# Patient Record
Sex: Female | Born: 1935 | Race: White | Hispanic: No | Marital: Married | Smoking: Former smoker
Health system: Southern US, Community
[De-identification: ages and names within clinical notes are randomized; demographics above are authoritative.]

## PROBLEM LIST (undated history)

## (undated) DIAGNOSIS — K31819 Angiodysplasia of stomach and duodenum without bleeding: Secondary | ICD-10-CM

## (undated) DIAGNOSIS — F411 Generalized anxiety disorder: Secondary | ICD-10-CM

## (undated) DIAGNOSIS — K922 Gastrointestinal hemorrhage, unspecified: Secondary | ICD-10-CM

## (undated) DIAGNOSIS — J449 Chronic obstructive pulmonary disease, unspecified: Secondary | ICD-10-CM

## (undated) DIAGNOSIS — C4490 Unspecified malignant neoplasm of skin, unspecified: Secondary | ICD-10-CM

## (undated) DIAGNOSIS — D500 Iron deficiency anemia secondary to blood loss (chronic): Secondary | ICD-10-CM

## (undated) DIAGNOSIS — H9190 Unspecified hearing loss, unspecified ear: Secondary | ICD-10-CM

## (undated) DIAGNOSIS — N200 Calculus of kidney: Secondary | ICD-10-CM

## (undated) DIAGNOSIS — K6381 Dieulafoy lesion of intestine: Secondary | ICD-10-CM

## (undated) DIAGNOSIS — I639 Cerebral infarction, unspecified: Secondary | ICD-10-CM

## (undated) DIAGNOSIS — Z8719 Personal history of other diseases of the digestive system: Secondary | ICD-10-CM

## (undated) DIAGNOSIS — K279 Peptic ulcer, site unspecified, unspecified as acute or chronic, without hemorrhage or perforation: Secondary | ICD-10-CM

## (undated) DIAGNOSIS — D638 Anemia in other chronic diseases classified elsewhere: Secondary | ICD-10-CM

## (undated) DIAGNOSIS — R2681 Unsteadiness on feet: Secondary | ICD-10-CM

## (undated) DIAGNOSIS — I10 Essential (primary) hypertension: Secondary | ICD-10-CM

## (undated) DIAGNOSIS — F325 Major depressive disorder, single episode, in full remission: Secondary | ICD-10-CM

## (undated) DIAGNOSIS — M1990 Unspecified osteoarthritis, unspecified site: Secondary | ICD-10-CM

## (undated) DIAGNOSIS — J45909 Unspecified asthma, uncomplicated: Secondary | ICD-10-CM

## (undated) DIAGNOSIS — M810 Age-related osteoporosis without current pathological fracture: Secondary | ICD-10-CM

## (undated) DIAGNOSIS — D649 Anemia, unspecified: Secondary | ICD-10-CM

## (undated) HISTORY — DX: Essential (primary) hypertension: I10

## (undated) HISTORY — DX: Unspecified osteoarthritis, unspecified site: M19.90

## (undated) HISTORY — DX: Calculus of kidney: N20.0

## (undated) HISTORY — DX: Unspecified asthma, uncomplicated: J45.909

## (undated) HISTORY — DX: Personal history of other diseases of the digestive system: Z87.19

## (undated) HISTORY — DX: Age-related osteoporosis without current pathological fracture: M81.0

## (undated) HISTORY — PX: APPENDECTOMY: SHX54

## (undated) HISTORY — PX: ABDOMINAL HYSTERECTOMY: SHX81

## (undated) HISTORY — DX: Iron deficiency anemia secondary to blood loss (chronic): D50.0

## (undated) HISTORY — DX: Cerebral infarction, unspecified: I63.9

## (undated) HISTORY — DX: Unspecified hearing loss, unspecified ear: H91.90

## (undated) HISTORY — PX: TONSILLECTOMY: SUR1361

## (undated) HISTORY — DX: Unspecified malignant neoplasm of skin, unspecified: C44.90

## (undated) HISTORY — DX: Chronic obstructive pulmonary disease, unspecified: J44.9

## (undated) HISTORY — DX: Anemia, unspecified: D64.9

## (undated) HISTORY — DX: Anemia in other chronic diseases classified elsewhere: D63.8

---

## 2002-09-02 ENCOUNTER — Ambulatory Visit (HOSPITAL_COMMUNITY): Admission: RE | Admit: 2002-09-02 | Discharge: 2002-09-02 | Payer: Self-pay | Admitting: Family Medicine

## 2002-09-02 ENCOUNTER — Encounter: Payer: Self-pay | Admitting: Family Medicine

## 2003-05-25 ENCOUNTER — Encounter: Payer: Self-pay | Admitting: Family Medicine

## 2003-05-25 ENCOUNTER — Ambulatory Visit (HOSPITAL_COMMUNITY): Admission: RE | Admit: 2003-05-25 | Discharge: 2003-05-25 | Payer: Self-pay

## 2003-05-25 ENCOUNTER — Ambulatory Visit (HOSPITAL_COMMUNITY): Admission: RE | Admit: 2003-05-25 | Discharge: 2003-05-25 | Payer: Self-pay | Admitting: Family Medicine

## 2004-08-26 DIAGNOSIS — K6381 Dieulafoy lesion of intestine: Secondary | ICD-10-CM

## 2004-08-26 HISTORY — DX: Dieulafoy lesion of intestine: K63.81

## 2005-03-25 ENCOUNTER — Ambulatory Visit (HOSPITAL_COMMUNITY): Admission: RE | Admit: 2005-03-25 | Discharge: 2005-03-25 | Payer: Self-pay

## 2005-03-26 HISTORY — PX: ESOPHAGOGASTRODUODENOSCOPY: SHX1529

## 2005-03-26 HISTORY — PX: OTHER SURGICAL HISTORY: SHX169

## 2005-03-26 HISTORY — PX: COLONOSCOPY: SHX174

## 2005-04-01 ENCOUNTER — Ambulatory Visit: Payer: Self-pay | Admitting: Internal Medicine

## 2005-04-01 ENCOUNTER — Inpatient Hospital Stay (HOSPITAL_COMMUNITY): Admission: AD | Admit: 2005-04-01 | Discharge: 2005-04-05 | Payer: Self-pay | Admitting: Family Medicine

## 2005-04-04 ENCOUNTER — Ambulatory Visit: Payer: Self-pay | Admitting: *Deleted

## 2005-05-07 ENCOUNTER — Encounter: Admission: RE | Admit: 2005-05-07 | Discharge: 2005-05-25 | Payer: Self-pay | Admitting: Oncology

## 2005-05-07 ENCOUNTER — Ambulatory Visit (HOSPITAL_COMMUNITY): Payer: Self-pay | Admitting: Oncology

## 2005-05-07 ENCOUNTER — Encounter (HOSPITAL_COMMUNITY): Admission: RE | Admit: 2005-05-07 | Discharge: 2005-05-25 | Payer: Self-pay | Admitting: Oncology

## 2005-05-14 ENCOUNTER — Ambulatory Visit (HOSPITAL_COMMUNITY): Admission: RE | Admit: 2005-05-14 | Discharge: 2005-05-14 | Payer: Self-pay | Admitting: Oncology

## 2005-06-12 ENCOUNTER — Encounter (HOSPITAL_COMMUNITY): Admission: RE | Admit: 2005-06-12 | Discharge: 2005-07-12 | Payer: Self-pay | Admitting: Oncology

## 2005-06-12 ENCOUNTER — Encounter: Admission: RE | Admit: 2005-06-12 | Discharge: 2005-06-12 | Payer: Self-pay | Admitting: Oncology

## 2005-07-02 ENCOUNTER — Ambulatory Visit (HOSPITAL_COMMUNITY): Payer: Self-pay | Admitting: Oncology

## 2005-07-02 ENCOUNTER — Encounter (HOSPITAL_COMMUNITY): Admission: RE | Admit: 2005-07-02 | Discharge: 2005-08-01 | Payer: Self-pay | Admitting: Oncology

## 2005-07-02 ENCOUNTER — Encounter: Admission: RE | Admit: 2005-07-02 | Discharge: 2005-07-02 | Payer: Self-pay | Admitting: Oncology

## 2005-08-06 ENCOUNTER — Encounter: Admission: RE | Admit: 2005-08-06 | Discharge: 2005-08-22 | Payer: Self-pay | Admitting: Oncology

## 2005-08-06 ENCOUNTER — Encounter (HOSPITAL_COMMUNITY): Admission: RE | Admit: 2005-08-06 | Discharge: 2005-08-22 | Payer: Self-pay | Admitting: Oncology

## 2005-08-22 ENCOUNTER — Ambulatory Visit (HOSPITAL_COMMUNITY): Payer: Self-pay | Admitting: Oncology

## 2005-09-19 ENCOUNTER — Encounter (HOSPITAL_COMMUNITY): Admission: RE | Admit: 2005-09-19 | Discharge: 2005-10-19 | Payer: Self-pay | Admitting: Oncology

## 2005-09-19 ENCOUNTER — Encounter: Admission: RE | Admit: 2005-09-19 | Discharge: 2005-09-19 | Payer: Self-pay | Admitting: Oncology

## 2005-10-21 ENCOUNTER — Encounter: Admission: RE | Admit: 2005-10-21 | Discharge: 2005-10-21 | Payer: Self-pay | Admitting: Oncology

## 2005-10-21 ENCOUNTER — Ambulatory Visit (HOSPITAL_COMMUNITY): Payer: Self-pay | Admitting: Oncology

## 2005-12-02 ENCOUNTER — Encounter: Admission: RE | Admit: 2005-12-02 | Discharge: 2005-12-02 | Payer: Self-pay | Admitting: Oncology

## 2005-12-02 ENCOUNTER — Encounter (HOSPITAL_COMMUNITY): Admission: RE | Admit: 2005-12-02 | Discharge: 2006-01-01 | Payer: Self-pay | Admitting: Oncology

## 2005-12-11 ENCOUNTER — Ambulatory Visit (HOSPITAL_COMMUNITY): Payer: Self-pay | Admitting: Oncology

## 2006-01-13 ENCOUNTER — Encounter: Admission: RE | Admit: 2006-01-13 | Discharge: 2006-01-13 | Payer: Self-pay | Admitting: Oncology

## 2006-01-13 ENCOUNTER — Encounter (HOSPITAL_COMMUNITY): Admission: RE | Admit: 2006-01-13 | Discharge: 2006-02-12 | Payer: Self-pay | Admitting: Oncology

## 2006-03-07 ENCOUNTER — Encounter: Admission: RE | Admit: 2006-03-07 | Discharge: 2006-03-07 | Payer: Self-pay | Admitting: Oncology

## 2006-03-07 ENCOUNTER — Encounter (HOSPITAL_COMMUNITY): Admission: RE | Admit: 2006-03-07 | Discharge: 2006-04-06 | Payer: Self-pay | Admitting: Oncology

## 2006-03-07 ENCOUNTER — Ambulatory Visit (HOSPITAL_COMMUNITY): Payer: Self-pay | Admitting: Oncology

## 2010-09-26 ENCOUNTER — Encounter: Payer: Self-pay | Admitting: Family Medicine

## 2011-10-30 ENCOUNTER — Ambulatory Visit (HOSPITAL_COMMUNITY)
Admission: RE | Admit: 2011-10-30 | Discharge: 2011-10-30 | Disposition: A | Discharge status: Home / Self Care | Payer: Medicare Other | Source: Ambulatory Visit | Attending: Family Medicine | Admitting: Family Medicine

## 2011-10-30 ENCOUNTER — Other Ambulatory Visit: Payer: Self-pay | Admitting: Family Medicine

## 2011-10-30 DIAGNOSIS — R059 Cough, unspecified: Secondary | ICD-10-CM

## 2011-10-30 DIAGNOSIS — J449 Chronic obstructive pulmonary disease, unspecified: Secondary | ICD-10-CM | POA: Insufficient documentation

## 2011-10-30 DIAGNOSIS — R05 Cough: Secondary | ICD-10-CM | POA: Diagnosis not present

## 2011-10-30 DIAGNOSIS — M810 Age-related osteoporosis without current pathological fracture: Secondary | ICD-10-CM | POA: Diagnosis not present

## 2011-10-30 DIAGNOSIS — E559 Vitamin D deficiency, unspecified: Secondary | ICD-10-CM | POA: Diagnosis not present

## 2011-10-30 DIAGNOSIS — F17200 Nicotine dependence, unspecified, uncomplicated: Secondary | ICD-10-CM | POA: Diagnosis not present

## 2011-10-30 DIAGNOSIS — J4489 Other specified chronic obstructive pulmonary disease: Secondary | ICD-10-CM | POA: Insufficient documentation

## 2011-10-31 DIAGNOSIS — I10 Essential (primary) hypertension: Secondary | ICD-10-CM | POA: Diagnosis not present

## 2011-10-31 DIAGNOSIS — M810 Age-related osteoporosis without current pathological fracture: Secondary | ICD-10-CM | POA: Diagnosis not present

## 2011-10-31 DIAGNOSIS — B150 Hepatitis A with hepatic coma: Secondary | ICD-10-CM | POA: Diagnosis not present

## 2011-11-11 DIAGNOSIS — L820 Inflamed seborrheic keratosis: Secondary | ICD-10-CM | POA: Diagnosis not present

## 2011-11-11 DIAGNOSIS — L570 Actinic keratosis: Secondary | ICD-10-CM | POA: Diagnosis not present

## 2012-05-04 DIAGNOSIS — J449 Chronic obstructive pulmonary disease, unspecified: Secondary | ICD-10-CM | POA: Diagnosis not present

## 2012-06-17 DIAGNOSIS — Z23 Encounter for immunization: Secondary | ICD-10-CM | POA: Diagnosis not present

## 2012-08-06 DIAGNOSIS — J449 Chronic obstructive pulmonary disease, unspecified: Secondary | ICD-10-CM | POA: Diagnosis not present

## 2012-08-26 DIAGNOSIS — K31819 Angiodysplasia of stomach and duodenum without bleeding: Secondary | ICD-10-CM

## 2012-08-26 HISTORY — DX: Angiodysplasia of stomach and duodenum without bleeding: K31.819

## 2013-01-11 ENCOUNTER — Encounter: Payer: Self-pay | Admitting: *Deleted

## 2013-01-12 ENCOUNTER — Encounter: Payer: Self-pay | Admitting: Family Medicine

## 2013-01-12 ENCOUNTER — Ambulatory Visit (INDEPENDENT_AMBULATORY_CARE_PROVIDER_SITE_OTHER): Payer: Medicare Other | Admitting: Family Medicine

## 2013-01-12 VITALS — BP 98/60 | HR 70 | Wt 96.4 lb

## 2013-01-12 DIAGNOSIS — J449 Chronic obstructive pulmonary disease, unspecified: Secondary | ICD-10-CM | POA: Insufficient documentation

## 2013-01-12 DIAGNOSIS — M810 Age-related osteoporosis without current pathological fracture: Secondary | ICD-10-CM | POA: Diagnosis not present

## 2013-01-12 NOTE — Progress Notes (Signed)
  Subjective:    Patient ID: Carol Massey, female    DOB: 03-28-1926, 79 y.o.   MRN: [id number]  Hypertension This is a chronic problem.  This patient has COPD he has history hypertension and she is very resistant toward doing anything for it she smokes she knows she smokes she doesn't want to quit she's been counseled to quit she also states she does not want to have any tests nor any medications. She has a history of osteoporosis does not want any testing. She comes here today to have her lungs listened to. She denies any specific troubles. Occasionally coughs no hemoptysis. Family history noncontributory underlying stress with her family sickness Social she does smoke    Review of Systems See above    Objective:   Physical Exam Lungs are clear no crackles heart is regular pulses normal neck no masses skin warm dry vital signs stable she has gained weight       Assessment & Plan:  COPD-stable I encourage her to quit smoking she never well She has gained weight for which we are proud of her for doing so Osteoporosis recommend bone density testing she refuses Followup 6 months

## 2013-03-15 ENCOUNTER — Ambulatory Visit (INDEPENDENT_AMBULATORY_CARE_PROVIDER_SITE_OTHER): Payer: Medicare Other | Admitting: Family Medicine

## 2013-03-15 ENCOUNTER — Ambulatory Visit (HOSPITAL_COMMUNITY)
Admission: RE | Admit: 2013-03-15 | Discharge: 2013-03-15 | Disposition: A | Discharge status: Home / Self Care | Payer: Medicare Other | Source: Ambulatory Visit | Attending: Family Medicine | Admitting: Family Medicine

## 2013-03-15 ENCOUNTER — Encounter: Payer: Self-pay | Admitting: Family Medicine

## 2013-03-15 VITALS — BP 130/70 | HR 80 | Wt 97.4 lb

## 2013-03-15 DIAGNOSIS — R071 Chest pain on breathing: Secondary | ICD-10-CM | POA: Insufficient documentation

## 2013-03-15 DIAGNOSIS — J449 Chronic obstructive pulmonary disease, unspecified: Secondary | ICD-10-CM | POA: Diagnosis not present

## 2013-03-15 DIAGNOSIS — R0781 Pleurodynia: Secondary | ICD-10-CM

## 2013-03-15 LAB — CK TOTAL AND CKMB (NOT AT ARMC): Relative Index: 2.6 — ABNORMAL HIGH (ref 0.0–2.5)

## 2013-03-15 LAB — TROPONIN I: Troponin I: <0.3 ng/mL (ref <0.30)

## 2013-03-15 LAB — D-DIMER, QUANTITATIVE: D-Dimer, Quant: 0.37 ug/mL-FEU (ref 0.00–0.48)

## 2013-03-15 MED ORDER — PREDNISONE 20 MG PO TABS: ORAL_TABLET | ORAL | Status: AC

## 2013-03-15 MED ORDER — TRAMADOL HCL 50 MG PO TABS: 50.0000 mg | ORAL_TABLET | Freq: Four times a day (QID) | ORAL | Status: DC | PRN

## 2013-03-15 NOTE — Progress Notes (Signed)
  Subjective:    Patient ID: Carol Massey, female    DOB: December 14, 1925, 79 y.o.   MRN: [id number]  Chest Pain  This is a new problem. The current episode started yesterday. The onset quality is sudden. The problem occurs constantly. The problem has been unchanged. The pain is moderate. The quality of the pain is described as dull. The pain radiates to the left arm, left neck and left shoulder.   PMH COPD osteoporosis. She denies any chest pressure in the chest. She denies any angina symptoms. This came on right or quickly over the past 2 days she denies any swelling in the legs   Review of Systems  Cardiovascular: Positive for chest pain.   this patient is a smoker she's been counseled numerous times quit and has not wanted to. She does relate shortness of breath with activity she also states stabbing pain in the left posterior aspect of the chest sharp radiates to her chest hurts to take a deep breath    Objective:   Physical Exam Extremities no edema pulses are normal blood pressure good lungs are clear heart is regular no rash seen moderate tenderness left upper back       Assessment & Plan:  Pleuritic chest pain-x-ray-d-dimer, CPK-MB, troponin I. Encourage patient to quit smoking she is not going to. Prednisone taper over the next 8 days, may need further tests. Tramadol if necessary for pain every 6 hours when necessary  It should be noted that the patient's tests came back normal CK-MB total also troponin was normal d-dimer normal. Him be fraction slightly elevated is non-consequential. I believe the patient is having pleuritic pain she will followup in a few days for recheck.

## 2013-03-18 ENCOUNTER — Ambulatory Visit (INDEPENDENT_AMBULATORY_CARE_PROVIDER_SITE_OTHER): Payer: Medicare Other | Admitting: Family Medicine

## 2013-03-18 ENCOUNTER — Encounter: Payer: Self-pay | Admitting: Family Medicine

## 2013-03-18 VITALS — BP 132/86 | Wt 97.0 lb

## 2013-03-18 DIAGNOSIS — J449 Chronic obstructive pulmonary disease, unspecified: Secondary | ICD-10-CM

## 2013-03-18 DIAGNOSIS — E785 Hyperlipidemia, unspecified: Secondary | ICD-10-CM | POA: Diagnosis not present

## 2013-03-18 NOTE — Progress Notes (Signed)
  Subjective:    Patient ID: Carol Massey, female    DOB: 12-28-1925, 79 y.o.   MRN: [id number]  HPI Patient arrives to follow up on chest pain. This patient seen recently it is felt to be pleuritic pain was put on prednisone she actually states she's much better her breathing is doing fine she denies any coughing still low but a sore around the upper part of her neck when she takes a deep breath she notices it. She denies hemoptysis. She denies swelling in the legs. PMH COPD patient is a smoker labs and x-rays from previous was reviewed with patient. Patient not interested in quitting smoking.   Review of Systems See above    Objective:   Physical Exam Lungs are clear no crackles heart is regular pulse normal neck no masses no tenderness on today's exam extremities no edema       Assessment & Plan:  Pleuritic chest pain resolved. I doubt that this is any sign of aortic dissection. I told the patient if she starts having return of symptoms she is to let us know. She is followup in 3 months  Hyperlipidemia check lab work been one year

## 2013-03-24 DIAGNOSIS — E785 Hyperlipidemia, unspecified: Secondary | ICD-10-CM | POA: Diagnosis not present

## 2013-03-25 LAB — LIPID PANEL
HDL: 67 mg/dL (ref >39)
LDL Cholesterol: 74 mg/dL (ref 0–99)
Triglycerides: 114 mg/dL (ref <150)
VLDL: 23 mg/dL (ref 0–40)

## 2013-03-26 ENCOUNTER — Encounter: Payer: Self-pay | Admitting: Family Medicine

## 2013-03-30 ENCOUNTER — Encounter: Payer: Self-pay | Admitting: Family Medicine

## 2013-05-25 ENCOUNTER — Ambulatory Visit (INDEPENDENT_AMBULATORY_CARE_PROVIDER_SITE_OTHER): Payer: Medicare Other | Admitting: Family Medicine

## 2013-05-25 ENCOUNTER — Encounter: Payer: Self-pay | Admitting: Family Medicine

## 2013-05-25 VITALS — BP 102/70 | Ht <= 58 in | Wt 98.1 lb

## 2013-05-25 DIAGNOSIS — J449 Chronic obstructive pulmonary disease, unspecified: Secondary | ICD-10-CM

## 2013-05-25 DIAGNOSIS — Z23 Encounter for immunization: Secondary | ICD-10-CM

## 2013-05-25 NOTE — Progress Notes (Signed)
  Subjective:    Patient ID: Carol Massey, female    DOB: 03/05/1926, 78 y.o.   MRN: [id number]  Hyperlipidemia This is a chronic problem. The current episode started more than 1 year ago. The problem is controlled. Recent lipid tests were reviewed and are normal. There are no known factors aggravating her hyperlipidemia. Pertinent negatives include no chest pain. She is currently on no antihyperlipidemic treatment. The current treatment provides moderate improvement of lipids. There are no compliance problems.  There are no known risk factors for coronary artery disease.    Patient complains of congestion and cough for 2 months now. Patient smokes and she will never quit she states she denies any weight loss denies hemoptysis denies sweats chills fevers.   Review of Systems  Constitutional: Negative for fever, appetite change and fatigue.  HENT: Positive for congestion and rhinorrhea.   Respiratory: Negative for cough, choking and chest tightness.   Cardiovascular: Negative for chest pain and leg swelling.  Gastrointestinal: Negative for abdominal pain.       Objective:   Physical Exam  Vitals reviewed. Constitutional: She appears well-developed.  HENT:  Head: Normocephalic and atraumatic.  Right Ear: External ear normal.  Left Ear: External ear normal.  Mouth/Throat: Oropharynx is clear and moist.  Cardiovascular: Normal rate and regular rhythm.   Murmur (right sternal border 2/6 murmur) heard. Pulmonary/Chest: Effort normal and breath sounds normal. She has no wheezes.  Lymphadenopathy:    She has no cervical adenopathy.          Assessment & Plan:  Flu vaccine today COPD-stable I have talked with patient at length about the importance of quitting smoking but this is just not something she is ever going to do. Recent lipid profile looked great. Followup patient 6 months.

## 2013-06-17 ENCOUNTER — Ambulatory Visit (INDEPENDENT_AMBULATORY_CARE_PROVIDER_SITE_OTHER): Payer: Medicare Other | Admitting: Family Medicine

## 2013-06-17 ENCOUNTER — Ambulatory Visit (HOSPITAL_COMMUNITY)
Admission: RE | Admit: 2013-06-17 | Discharge: 2013-06-17 | Disposition: A | Discharge status: Home / Self Care | Payer: Medicare Other | Source: Ambulatory Visit | Attending: Family Medicine | Admitting: Family Medicine

## 2013-06-17 ENCOUNTER — Encounter: Payer: Self-pay | Admitting: Family Medicine

## 2013-06-17 VITALS — BP 130/88 | Temp 98.1°F | Ht <= 58 in | Wt 100.4 lb

## 2013-06-17 DIAGNOSIS — R5381 Other malaise: Secondary | ICD-10-CM

## 2013-06-17 DIAGNOSIS — R079 Chest pain, unspecified: Secondary | ICD-10-CM | POA: Diagnosis not present

## 2013-06-17 DIAGNOSIS — R0781 Pleurodynia: Secondary | ICD-10-CM

## 2013-06-17 DIAGNOSIS — R5383 Other fatigue: Secondary | ICD-10-CM | POA: Diagnosis not present

## 2013-06-17 LAB — CBC
HCT: 29.8 % — ABNORMAL LOW (ref 36.0–46.0)
MCH: 22.6 pg — ABNORMAL LOW (ref 26.0–34.0)
MCV: 75.8 fL — ABNORMAL LOW (ref 78.0–100.0)
Platelets: 322 10*3/uL (ref 150–400)
RDW: 17.2 % — ABNORMAL HIGH (ref 11.5–15.5)
WBC: 5.9 10*3/uL (ref 4.0–10.5)

## 2013-06-17 NOTE — Progress Notes (Signed)
  Subjective:    Patient ID: Carol Massey, female    DOB: [birth year], 79 y.o.   MRN: [id number]  HPI Patient is here today b/c she is having pain in her ribs (left side).  She was unloading the clothes from the washing machine on Tuesday, and bent over too far and heard a "crack". Patient is now experiencing pain on the left side of her ribs.   No other concerns.  Patient's family states they feel she is weak at times and fatigued patient states that she is older she smokes and she feels it's natural to feel the way she does patient was concerned about mainly her ribs. Family concerned about possibility of been anemic. Hemoglobin was checked it was slightly low no fevers she did have a chest x-ray several months ago which was normal she's been counseled numerous times quit smoking he has no interest in quitting. Past medical history benign family history benign patient also has very little interest in treatment of osteoporosis or any other health problems   Review of Systems See above she denies high fever chills sweats vomiting diarrhea rectal bleeding    Objective:   Physical Exam  Lungs are clear hearts regular tenderness ribs abdomen soft extremities no edema      Assessment & Plan:  Anemia-check CBC await the results Rib discomfort x-rays ordered  It should be noted that her x-rays came back later in the day negative. Her son was spoken with. More than likely has cartilage damage. To followup if any other particular problems awaiting lab work. Patient in the past did have to be admitted into the hospital do to anemia and given blood I doubt that that is necessary this time.

## 2013-06-18 LAB — BASIC METABOLIC PANEL
CO2: 29 mEq/L (ref 19–32)
Calcium: 9.6 mg/dL (ref 8.4–10.5)
Glucose, Bld: 91 mg/dL (ref 70–99)

## 2013-06-22 ENCOUNTER — Other Ambulatory Visit (INDEPENDENT_AMBULATORY_CARE_PROVIDER_SITE_OTHER): Payer: Medicare Other | Admitting: *Deleted

## 2013-06-22 DIAGNOSIS — D649 Anemia, unspecified: Secondary | ICD-10-CM

## 2013-06-22 NOTE — Progress Notes (Signed)
Spoke with patient and son regarding: lab shows anemia-possible iron def- rec ferritin,TIBC,retic count and heme cards times three-follow up OV to discuss once labs and cards are done. Patient verbalized understanding and will pick up the lab work today, get BW done, and will schedule an OV.

## 2013-06-23 ENCOUNTER — Other Ambulatory Visit: Payer: Self-pay | Admitting: Family Medicine

## 2013-06-23 DIAGNOSIS — D509 Iron deficiency anemia, unspecified: Secondary | ICD-10-CM

## 2013-06-23 LAB — IRON AND TIBC
%SAT: 8 % — ABNORMAL LOW (ref 20–55)
Iron: 29 ug/dL — ABNORMAL LOW (ref 42–145)

## 2013-06-29 ENCOUNTER — Encounter: Payer: Self-pay | Admitting: Gastroenterology

## 2013-07-14 ENCOUNTER — Other Ambulatory Visit: Payer: Self-pay | Admitting: Gastroenterology

## 2013-07-14 ENCOUNTER — Encounter: Payer: Self-pay | Admitting: Gastroenterology

## 2013-07-14 ENCOUNTER — Encounter (INDEPENDENT_AMBULATORY_CARE_PROVIDER_SITE_OTHER): Payer: Self-pay

## 2013-07-14 ENCOUNTER — Ambulatory Visit (INDEPENDENT_AMBULATORY_CARE_PROVIDER_SITE_OTHER): Payer: Medicare Other | Admitting: Gastroenterology

## 2013-07-14 VITALS — BP 158/60 | HR 70 | Temp 97.6°F | Ht <= 58 in | Wt 101.2 lb

## 2013-07-14 DIAGNOSIS — D509 Iron deficiency anemia, unspecified: Secondary | ICD-10-CM | POA: Diagnosis not present

## 2013-07-14 LAB — FERRITIN: Ferritin: 9 ng/mL — ABNORMAL LOW (ref 10–291)

## 2013-07-14 LAB — CBC WITH DIFFERENTIAL/PLATELET
Eosinophils Absolute: 0.1 10*3/uL (ref 0.0–0.7)
Eosinophils Relative: 3 % (ref 0–5)
Lymphocytes Relative: 31 % (ref 12–46)
Lymphs Abs: 1.4 10*3/uL (ref 0.7–4.0)
Monocytes Relative: 8 % (ref 3–12)
Neutro Abs: 2.5 10*3/uL (ref 1.7–7.7)
Platelets: 294 10*3/uL (ref 150–400)
RBC: 3.73 MIL/uL — ABNORMAL LOW (ref 3.87–5.11)
RDW: 16.6 % — ABNORMAL HIGH (ref 11.5–15.5)

## 2013-07-14 NOTE — Progress Notes (Signed)
Primary Care Physician:  Lilyan Punt, MD  Primary Gastroenterologist:  Roetta Sessions, MD   Chief Complaint  Patient presents with  . Anemia    HPI:  Carol Massey is a 79 y.o. female here for further evaluation of microcytic anemia. Labs consistent with iron deficiency anemia. She was Hemoccult negative x3. Patient was evaluated by [place] back in 2006 when she presented to the hospital with a hemoglobin of 3, Hemoccult positive stools at that time. EGD and colonoscopy at that time showed a couple of tiny distal esophageal erosions, rectal and rectosigmoid polyps which were removed. Small bowel capsule study was done to complete workup which showed possible 2 small Dieulafoy lesions at the descending colon although these were not seen at time of colonoscopy. Patient was evaluated by Dr. Mariel Sleet at that time, records unavailable. Patient reports she was told her bone marrow wasn't making enough "blood". She received 7 units of prbcs during/around the time of that hospitalization per patient.   She states she does not want to have a colonoscopy. She doesn't feel like she can go through the bowel prep. She denies constipation, diarrhea, melena, brbpr, abdominal pain, vomiting, heartburn, dysphagia, anorexia, weight loss.  C/o persistently sore left lower rib cage but much improved since injury couple of weeks ago.  Current Outpatient Prescriptions  Medication Sig Dispense Refill  . aspirin 81 MG tablet Take 81 mg by mouth daily.      . Calcium Carb-Cholecalciferol (CALCIUM + D3 PO) Take by mouth daily.      . IRON PO Take by mouth daily.      [name] UNABLE TO FIND Vit C one every day       No current facility-administered medications for this visit.    Allergies as of 07/14/2013  . (No Known Allergies)    Past Medical History  Diagnosis Date  . Stroke   . Hypertension   . Osteoporosis   . COPD (chronic obstructive pulmonary disease)   . Anemia   . Hard of hearing     Past Surgical History   Procedure Laterality Date  . Appendectomy    . Abdominal hysterectomy    . Tonsillectomy    . Colonoscopy  03/2005    Dr. Jena Gauss: rectal and rectosigmoid polyps removed  . Esophagogastroduodenoscopy  03/2005    Dr. Jena Gauss: tiney distal esophageal erosions  . Small bowel capsule study  03/2005    Dr. Karilyn Cota: two small Dieulafoy lesions at ascending colon. tiney specks of blood at duodenal bulb but no identified lesions    Family History  Problem Relation Age of Onset  . Hypertension Mother   . Leukemia Father 33  . Stroke Mother     History   Social History  . Marital Status: Married    Spouse Name: N/A    Number of Children: 3  . Years of Education: N/A   Occupational History  . retired from [employer]    Social History Main Topics  . Smoking status: Current Every Day Smoker  . Smokeless tobacco: Not on file     Comment: Occasionally smokes a cigerette  . Alcohol Use: No  . Drug Use: No  . Sexual Activity: Not on file   Other Topics Concern  . Not on file   Social History Narrative  . No narrative on file      ROS:  General: Negative for anorexia, weight loss, fever, chills, fatigue, weakness. Eyes: Negative for vision changes.  ENT: Negative for hoarseness, difficulty  swallowing , nasal congestion. CV: Negative for chest pain, angina, palpitations, dyspnea on exertion, peripheral edema.  Respiratory: Negative for dyspnea at rest, dyspnea on exertion, cough, sputum, wheezing.  GI: See history of present illness. GU:  Negative for dysuria, hematuria, urinary incontinence, urinary frequency, nocturnal urination.  MS: Negative for joint pain, low back pain.  Derm: Negative for rash or itching.  Neuro: Negative for weakness, abnormal sensation, seizure, frequent headaches, memory loss, confusion.  Psych: Negative for anxiety, depression, suicidal ideation, hallucinations.  Endo: Negative for unusual weight change.  Heme: Negative for bruising or  bleeding. Allergy: Negative for rash or hives.    Physical Examination:  BP 185/76  Pulse 70  Temp(Src) 97.6 F (36.4 C) (Oral)  Ht 4' 8.5" (1.435 m)  Wt 101 lb 3.2 oz (45.904 kg)  BMI 22.29 kg/m2   General: Well-nourished, well-developed in no acute distress.  Head: Normocephalic, atraumatic.   Eyes: Conjunctiva pink, no icterus. Mouth: Oropharyngeal mucosa moist and pink , no lesions erythema or exudate. Neck: Supple without thyromegaly, masses, or lymphadenopathy.  Lungs: Clear to auscultation bilaterally.  Heart: Regular rate and rhythm, no murmurs rubs or gallops.  Abdomen: Bowel sounds are normal, nontender, nondistended, no hepatosplenomegaly or masses, no abdominal bruits or    hernia , no rebound or guarding.   Rectal: not performed Extremities: No lower extremity edema. No clubbing or deformities.  Neuro: Alert and oriented x 4 , grossly normal neurologically.  Skin: Warm and dry, no rash or jaundice.   Psych: Alert and cooperative, normal mood and affect.  Labs: Lab Results  Component Value Date   FERRITIN 10 06/22/2013   Lab Results  Component Value Date   IRON 29* 06/22/2013   TIBC 351 06/22/2013   FERRITIN 10 06/22/2013   Lab Results  Component Value Date   WBC 5.9 06/17/2013   HGB 8.9* 06/17/2013   HCT 29.8* 06/17/2013   MCV 75.8* 06/17/2013   PLT 322 06/17/2013   Lab Results  Component Value Date   CREATININE 0.60 06/17/2013   BUN 9 06/17/2013   NA 141 06/17/2013   K 4.1 06/17/2013   CL 105 06/17/2013   CO2 29 06/17/2013        Imaging Studies: Dg Ribs Unilateral Left  06/17/2013   CLINICAL DATA:  Left rib pain.  EXAM: LEFT RIBS - 2 VIEW  COMPARISON:  03/15/2013  FINDINGS: No visible rib fracture. No pneumothorax. Left lung is clear. No effusion.  IMPRESSION: Negative.   Electronically Signed   By: Charlett Nose M.D.   On: 06/17/2013 13:29

## 2013-07-14 NOTE — Assessment & Plan Note (Signed)
79 y/o female with microcytic anemia, most c/w IDA, heme negative stools X 3. Presented in 2006 with profound anemia and leukopenia with heme + stools then. EGD/TCS unrevealing. SB capsule with ?of two small Dieulafoy lesions in ascending colon (not seen at time of colonoscopy). Patient reports following with Dr. Mariel Sleet for about one year after this.   Currently she has no GI symptoms. She states she is not interested in colonoscopy. Explained that typical work-up/management of IDA would be colonoscopy with possible EGD. Given ?history of Dieulafoy's lesion in colon would highly recommend considering colonoscopy. We discussed collecting ifobt and repeating CBC at this time. Further recommendations to follow.

## 2013-07-14 NOTE — Patient Instructions (Signed)
1. Please collect your stool specimen and return to our office as soon as you can.

## 2013-07-14 NOTE — Progress Notes (Signed)
Cc PCP 

## 2013-07-20 ENCOUNTER — Ambulatory Visit (INDEPENDENT_AMBULATORY_CARE_PROVIDER_SITE_OTHER): Payer: Medicare Other | Admitting: Gastroenterology

## 2013-07-20 ENCOUNTER — Telehealth: Payer: Self-pay | Admitting: Gastroenterology

## 2013-07-20 DIAGNOSIS — D649 Anemia, unspecified: Secondary | ICD-10-CM

## 2013-07-20 NOTE — Telephone Encounter (Signed)
Hemoccult negative.  I'm copying this to [place] as well for an [employer]

## 2013-07-20 NOTE — Telephone Encounter (Signed)
Patient is calling for stool study results and lab work , please advise?

## 2013-07-20 NOTE — Telephone Encounter (Signed)
Routing to [facility] box

## 2013-07-20 NOTE — Progress Notes (Signed)
Quick Note:  Please let patient know her Hgb is slightly lower from 8.9 to 8.1 four weeks ago. Stool is negative for blood.  Can we add a TTG and IgA level? I need to discuss with RMR since patient not interested in TCS. ______

## 2013-07-20 NOTE — Progress Notes (Signed)
Pt returned IFOBT test and it was NEGATIVE 

## 2013-07-21 NOTE — Telephone Encounter (Signed)
Pt is aware of results. 

## 2013-07-21 NOTE — Telephone Encounter (Signed)
See result note.  

## 2013-07-22 LAB — IGA: IgA: 125 mg/dL (ref 69–380)

## 2013-07-30 ENCOUNTER — Telehealth: Payer: Self-pay | Admitting: *Deleted

## 2013-07-30 NOTE — Telephone Encounter (Signed)
Wants results she had from resent labwork and to make sure Dr. Fletcher Anon gets a copy.

## 2013-07-30 NOTE — Telephone Encounter (Signed)
Spoke with pt

## 2013-08-02 ENCOUNTER — Other Ambulatory Visit: Payer: Self-pay | Admitting: Internal Medicine

## 2013-08-02 MED ORDER — PEG 3350-KCL-NA BICARB-NACL 420 G PO SOLR: 4000.0000 mL | ORAL | Status: DC

## 2013-08-09 ENCOUNTER — Encounter (HOSPITAL_COMMUNITY): Payer: Self-pay | Admitting: Pharmacy Technician

## 2013-08-09 ENCOUNTER — Other Ambulatory Visit: Payer: Self-pay | Admitting: Internal Medicine

## 2013-08-09 DIAGNOSIS — D509 Iron deficiency anemia, unspecified: Secondary | ICD-10-CM

## 2013-08-12 ENCOUNTER — Encounter (HOSPITAL_COMMUNITY): Payer: Self-pay | Admitting: *Deleted

## 2013-08-12 ENCOUNTER — Ambulatory Visit (HOSPITAL_COMMUNITY)
Admission: RE | Admit: 2013-08-12 | Discharge: 2013-08-12 | Disposition: A | Discharge status: Home / Self Care | Payer: Medicare Other | Source: Ambulatory Visit | Attending: Internal Medicine | Admitting: Internal Medicine

## 2013-08-12 ENCOUNTER — Encounter (HOSPITAL_COMMUNITY)
Admission: RE | Disposition: A | Discharge status: Home / Self Care | Payer: Self-pay | Source: Ambulatory Visit | Attending: Internal Medicine

## 2013-08-12 DIAGNOSIS — Z7982 Long term (current) use of aspirin: Secondary | ICD-10-CM | POA: Diagnosis not present

## 2013-08-12 DIAGNOSIS — K449 Diaphragmatic hernia without obstruction or gangrene: Secondary | ICD-10-CM | POA: Insufficient documentation

## 2013-08-12 DIAGNOSIS — K5730 Diverticulosis of large intestine without perforation or abscess without bleeding: Secondary | ICD-10-CM | POA: Insufficient documentation

## 2013-08-12 DIAGNOSIS — K31811 Angiodysplasia of stomach and duodenum with bleeding: Secondary | ICD-10-CM

## 2013-08-12 DIAGNOSIS — J4489 Other specified chronic obstructive pulmonary disease: Secondary | ICD-10-CM | POA: Insufficient documentation

## 2013-08-12 DIAGNOSIS — J449 Chronic obstructive pulmonary disease, unspecified: Secondary | ICD-10-CM | POA: Insufficient documentation

## 2013-08-12 DIAGNOSIS — D509 Iron deficiency anemia, unspecified: Secondary | ICD-10-CM

## 2013-08-12 DIAGNOSIS — R195 Other fecal abnormalities: Secondary | ICD-10-CM

## 2013-08-12 DIAGNOSIS — K222 Esophageal obstruction: Secondary | ICD-10-CM | POA: Diagnosis not present

## 2013-08-12 DIAGNOSIS — I10 Essential (primary) hypertension: Secondary | ICD-10-CM | POA: Diagnosis not present

## 2013-08-12 DIAGNOSIS — D126 Benign neoplasm of colon, unspecified: Secondary | ICD-10-CM | POA: Insufficient documentation

## 2013-08-12 HISTORY — PX: COLONOSCOPY WITH ESOPHAGOGASTRODUODENOSCOPY (EGD): SHX5779

## 2013-08-12 SURGERY — COLONOSCOPY WITH ESOPHAGOGASTRODUODENOSCOPY (EGD)
Anesthesia: Moderate Sedation

## 2013-08-12 MED ORDER — ONDANSETRON HCL 4 MG/2ML IJ SOLN
INTRAMUSCULAR | Status: DC | PRN
  Administered 2013-08-12: 4 mg via INTRAMUSCULAR

## 2013-08-12 MED ORDER — SODIUM CHLORIDE 0.9 % IV SOLN
INTRAVENOUS | Status: DC
  Administered 2013-08-12: 1000 mL via INTRAVENOUS

## 2013-08-12 MED ORDER — MEPERIDINE HCL 100 MG/ML IJ SOLN
INTRAMUSCULAR | Status: DC | PRN
  Administered 2013-08-12: 25 mg via INTRAVENOUS

## 2013-08-12 MED ORDER — MIDAZOLAM HCL 5 MG/5ML IJ SOLN
INTRAMUSCULAR | Status: AC
  Filled 2013-08-12: qty 10

## 2013-08-12 MED ORDER — MEPERIDINE HCL 100 MG/ML IJ SOLN
INTRAMUSCULAR | Status: AC
  Filled 2013-08-12: qty 2

## 2013-08-12 MED ORDER — MIDAZOLAM HCL 5 MG/5ML IJ SOLN
INTRAMUSCULAR | Status: DC | PRN
  Administered 2013-08-12: 1 mg via INTRAVENOUS
  Administered 2013-08-12: 2 mg via INTRAVENOUS
  Administered 2013-08-12: 1 mg via INTRAVENOUS

## 2013-08-12 MED ORDER — ONDANSETRON HCL 4 MG/2ML IJ SOLN
INTRAMUSCULAR | Status: AC
  Filled 2013-08-12: qty 2

## 2013-08-12 MED ORDER — STERILE WATER FOR IRRIGATION IR SOLN
Status: DC | PRN
  Administered 2013-08-12: 14:00:00

## 2013-08-12 NOTE — H&P (View-Only) (Signed)
Primary Care Physician:  LUKING,SCOTT, MD  Primary Gastroenterologist:  Michael Rourk, MD   Chief Complaint  Patient presents with  . Anemia    HPI:  Carol Massey is a 79 y.o. female here for further evaluation of microcytic anemia. Labs consistent with iron deficiency anemia. She was Hemoccult negative x3. Patient was evaluated by us back in 2006 when she presented to the hospital with a hemoglobin of 3, Hemoccult positive stools at that time. EGD and colonoscopy at that time showed a couple of tiny distal esophageal erosions, rectal and rectosigmoid polyps which were removed. Small bowel capsule study was done to complete workup which showed possible 2 small Dieulafoy lesions at the descending colon although these were not seen at time of colonoscopy. Patient was evaluated by Dr. Neijstrom at that time, records unavailable. Patient reports she was told her bone marrow wasn't making enough "blood". She received 7 units of prbcs during/around the time of that hospitalization per patient.   She states she does not want to have a colonoscopy. She doesn't feel like she can go through the bowel prep. She denies constipation, diarrhea, melena, brbpr, abdominal pain, vomiting, heartburn, dysphagia, anorexia, weight loss.  C/o persistently sore left lower rib cage but much improved since injury couple of weeks ago.  Current Outpatient Prescriptions  Medication Sig Dispense Refill  . aspirin 81 MG tablet Take 81 mg by mouth daily.      . Calcium Carb-Cholecalciferol (CALCIUM + D3 PO) Take by mouth daily.      . IRON PO Take by mouth daily.      . UNABLE TO FIND Vit C one every day       No current facility-administered medications for this visit.    Allergies as of 07/14/2013  . (No Known Allergies)    Past Medical History  Diagnosis Date  . Stroke   . Hypertension   . Osteoporosis   . COPD (chronic obstructive pulmonary disease)   . Anemia   . Hard of hearing     Past Surgical History   Procedure Laterality Date  . Appendectomy    . Abdominal hysterectomy    . Tonsillectomy    . Colonoscopy  03/2005    Dr. Rourk: rectal and rectosigmoid polyps removed  . Esophagogastroduodenoscopy  03/2005    Dr. Rourk: tiney distal esophageal erosions  . Small bowel capsule study  03/2005    Dr. Rehman: two small Dieulafoy lesions at ascending colon. tiney specks of blood at duodenal bulb but no identified lesions    Family History  Problem Relation Age of Onset  . Hypertension Mother   . Leukemia Father 50  . Stroke Mother     History   Social History  . Marital Status: Married    Spouse Name: N/A    Number of Children: 3  . Years of Education: N/A   Occupational History  . retired from [employer]    Social History Main Topics  . Smoking status: Current Every Day Smoker  . Smokeless tobacco: Not on file     Comment: Occasionally smokes a cigerette  . Alcohol Use: No  . Drug Use: No  . Sexual Activity: Not on file   Other Topics Concern  . Not on file   Social History Narrative  . No narrative on file      ROS:  General: Negative for anorexia, weight loss, fever, chills, fatigue, weakness. Eyes: Negative for vision changes.  ENT: Negative for hoarseness, difficulty   swallowing , nasal congestion. CV: Negative for chest pain, angina, palpitations, dyspnea on exertion, peripheral edema.  Respiratory: Negative for dyspnea at rest, dyspnea on exertion, cough, sputum, wheezing.  GI: See history of present illness. GU:  Negative for dysuria, hematuria, urinary incontinence, urinary frequency, nocturnal urination.  MS: Negative for joint pain, low back pain.  Derm: Negative for rash or itching.  Neuro: Negative for weakness, abnormal sensation, seizure, frequent headaches, memory loss, confusion.  Psych: Negative for anxiety, depression, suicidal ideation, hallucinations.  Endo: Negative for unusual weight change.  Heme: Negative for bruising or  bleeding. Allergy: Negative for rash or hives.    Physical Examination:  BP 185/76  Pulse 70  Temp(Src) 97.6 F (36.4 C) (Oral)  Ht 4' 8.5" (1.435 m)  Wt 101 lb 3.2 oz (45.904 kg)  BMI 22.29 kg/m2   General: Well-nourished, well-developed in no acute distress.  Head: Normocephalic, atraumatic.   Eyes: Conjunctiva pink, no icterus. Mouth: Oropharyngeal mucosa moist and pink , no lesions erythema or exudate. Neck: Supple without thyromegaly, masses, or lymphadenopathy.  Lungs: Clear to auscultation bilaterally.  Heart: Regular rate and rhythm, no murmurs rubs or gallops.  Abdomen: Bowel sounds are normal, nontender, nondistended, no hepatosplenomegaly or masses, no abdominal bruits or    hernia , no rebound or guarding.   Rectal: not performed Extremities: No lower extremity edema. No clubbing or deformities.  Neuro: Alert and oriented x 4 , grossly normal neurologically.  Skin: Warm and dry, no rash or jaundice.   Psych: Alert and cooperative, normal mood and affect.  Labs: Lab Results  Component Value Date   FERRITIN 10 06/22/2013   Lab Results  Component Value Date   IRON 29* 06/22/2013   TIBC 351 06/22/2013   FERRITIN 10 06/22/2013   Lab Results  Component Value Date   WBC 5.9 06/17/2013   HGB 8.9* 06/17/2013   HCT 29.8* 06/17/2013   MCV 75.8* 06/17/2013   PLT 322 06/17/2013   Lab Results  Component Value Date   CREATININE 0.60 06/17/2013   BUN 9 06/17/2013   NA 141 06/17/2013   K 4.1 06/17/2013   CL 105 06/17/2013   CO2 29 06/17/2013        Imaging Studies: Dg Ribs Unilateral Left  06/17/2013   CLINICAL DATA:  Left rib pain.  EXAM: LEFT RIBS - 2 VIEW  COMPARISON:  03/15/2013  FINDINGS: No visible rib fracture. No pneumothorax. Left lung is clear. No effusion.  IMPRESSION: Negative.   Electronically Signed   By: Kevin  Dover M.D.   On: 06/17/2013 13:29      

## 2013-08-12 NOTE — Op Note (Signed)
[facility] [address] [place], [address]   ENDOSCOPY PROCEDURE REPORT  PATIENT: Carol Massey, Carol Massey  MR#: [id number] BIRTHDATE: 16-Jul-1926 , 87  yrs. old GENDER: Female ENDOSCOPIST: R.  Roetta Sessions, MD FACP FACG REFERRED BY:  Lilyan Punt, M.D. PROCEDURE DATE:  08/12/2013 PROCEDURE:     EGD with lesion ablation/hemostasis clips applied  INDICATIONS:     Iron deficiency anemia; negative colonoscopy  INFORMED CONSENT:   The risks, benefits, limitations, alternatives and imponderables have been discussed.  The potential for biopsy, esophogeal dilation, etc. have also been reviewed.  Questions have been answered.  All parties agreeable.  Please see the history and physical in the medical record for more information.  MEDICATIONS:    Versed 4 mg IV and Demerol 25 mg IV in divided doses.  Zofran 4 mg IV  DESCRIPTION OF PROCEDURE:   The EG-[id number] ([id number]) and EC-[id number] ([id number])  endoscope was introduced through the mouth and advanced to the second portion of the duodenum without difficulty or limitations.  The mucosal surfaces were surveyed very carefully during advancement of the scope and upon withdrawal.  Retroflexion view of the proximal stomach and esophagogastric junction was performed.      FINDINGS: Noncritical Schatzki's ring; otherwise normal esophagus. Stomach empty. Moderate size hiatal hernia;  there was some fresh blood/clot adherent on a gastric AVM which was approximately 3 mm in diameter on the lesser curvature. Please see above photos. Remainder of the gastric mucosa appeared normal. Pylorus patent. Examination bulb second portion revealed no abnormalities.  THERAPEUTIC / DIAGNOSTIC MANEUVERS PERFORMED:  the above-mentioned AVM was treated x2 with a 7 French gold probe at 20 J each. This precipitated fresh bleeding. Subsequently, I applied to instinct hemostasis clips with excellent hemostasis being achieved   COMPLICATIONS:   None  IMPRESSION:  Noncritical Schatzki's ring-not manipulated (no dysphagia) hiatal hernia. 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 Followup on pathology (see colonoscopy report).I am uncertain at this time whether or not all of her iron deficiency anemia is related to GI bleeding(Clinically, has not had any evidence). She may have had similar vascular lesions in her small bowel on prior capsule study. I suspect she has issues with iron malabsorption and possibly bone marrow problems as well. I do believe it would be worthwhile for her to revisit the hematologist in the near future.  See colonoscopy report    _______________________________ R. Roetta Sessions, MD FACP [facility] eSigned:  R. Roetta Sessions, MD FACP [facility] 08/12/2013 2:51 PM     CC:  PATIENT NAME:  Rayvon, Brandvold MR#: [id number]

## 2013-08-12 NOTE — Interval H&P Note (Signed)
History and Physical Interval Note:  08/12/2013 2:03 PM  Carol Massey  has presented today for surgery, with the diagnosis of microcytic anemia  The various methods of treatment have been discussed with the patient and family. After consideration of risks, benefits and other options for treatment, the patient has consented to  Procedure(s) with comments: COLONOSCOPY WITH ESOPHAGOGASTRODUODENOSCOPY (EGD) (N/A) - 12:30 as a surgical intervention .  The patient's history has been reviewed, patient examined, no change in status, stable for surgery.  I have reviewed the patient's chart and labs.  Questions were answered to the patient's satisfaction.     Celiac screen negative. Hemoccult negative. Colonoscopy with possible EGD to follow.  The risks, benefits, limitations, imponderables and alternatives regarding both EGD and colonoscopy have been reviewed with the patient. Questions have been answered. All parties agreeable.   Eula Listen

## 2013-08-12 NOTE — Op Note (Signed)
[facility] [address] [place], [address]   COLONOSCOPY PROCEDURE REPORT  PATIENT: Carol Massey, Carol Massey  MR#:         [id number] BIRTHDATE: 1926-04-05 , 87  yrs. old GENDER: Female ENDOSCOPIST: R.  Roetta Sessions, MD FACP FACG REFERRED BY:  Lilyan Punt, M.D. PROCEDURE DATE:  08/12/2013 PROCEDURE:     Ileocolonoscopy with snare polypectomy  INDICATIONS: iron deficiency anemia; Hemoccult negative stool  INFORMED CONSENT:  The risks, benefits, alternatives and imponderables including but not limited to bleeding, perforation as well as the possibility of a missed lesion have been reviewed.  The potential for biopsy, lesion removal, etc. have also been discussed.  Questions have been answered.  All parties agreeable. Please see the history and physical in the medical record for more information.  MEDICATIONS: Versed 3 mg IV and Demerol 25 mg IV in divided doses. Zofran 4 mg IV.  DESCRIPTION OF PROCEDURE:  After a digital rectal exam was performed, the EC-[id number] ([id number])  colonoscope was advanced from the anus through the rectum and colon to the area of the cecum, ileocecal valve and appendiceal orifice.  The cecum was deeply intubated.  These structures were well-seen and photographed for the record.  From the level of the cecum and ileocecal valve, the scope was slowly and cautiously withdrawn.  The mucosal surfaces were carefully surveyed utilizing scope tip deflection to facilitate fold flattening as needed.  The scope was pulled down into the rectum where a thorough examination including retroflexion was performed.    FINDINGS:  Adequate preparation. Normal rectum. Left-sided diverticula; (1) 5 mm polyp in the base the cecum; patient had a 1.25 cm yellowish submucosal nodule at the hepatic flexure (positive pillow sign) consistent with a lipoma;  otherwise,  the colonic mucosa appeared normal. The distal 5 cm of terminal ileal mucosa appeared normal as well  .   THERAPEUTIC / DIAGNOSTIC MANEUVERS PERFORMED:  The above-mentioned polyps cold snare removed  COMPLICATIONS: None  CECAL WITHDRAWAL TIME:  9 minutes  IMPRESSION:  Colonic diverticulosis. Single colonic polyp-removed as described above  RECOMMENDATIONS: Followup on pathology. See EGD report   _______________________________ eSigned:  R. Roetta Sessions, MD FACP [facility] 08/12/2013 2:32 PM   CC:

## 2013-08-17 ENCOUNTER — Encounter (HOSPITAL_COMMUNITY): Payer: Self-pay | Admitting: Internal Medicine

## 2013-08-21 ENCOUNTER — Encounter: Payer: Self-pay | Admitting: Internal Medicine

## 2013-09-07 ENCOUNTER — Other Ambulatory Visit: Payer: Self-pay | Admitting: Family Medicine

## 2013-09-07 ENCOUNTER — Telehealth: Payer: Self-pay | Admitting: Family Medicine

## 2013-09-07 DIAGNOSIS — D649 Anemia, unspecified: Secondary | ICD-10-CM

## 2013-09-07 NOTE — Telephone Encounter (Signed)
Call the son, I did not receive any call regarding this issue from the gastroenterologist. I did review over the notes from the gastroenterologist at the time of a colonoscopy and they did mention her seeing pathologist. I can certainly refer her to hematology at [facility] . Verified family is fine with going forward with that. If so I can put in the referral and our staff should be able to get confirmation of an appointment within the next 10-14 days and notify them

## 2013-09-07 NOTE — Telephone Encounter (Signed)
Patient had a colonoscopy and due to her being anemic, the doctor who did colonoscopy said he was going to get in touch with Dr Nicki Reaper to set up a referral for her anemia. Patients son is calling to check on this?

## 2013-09-07 NOTE — Telephone Encounter (Signed)
Notified son Dr. Nicki Reaper did not receive any call regarding this issue from the gastroenterologist. The doctor did review over the notes from the gastroenterologist at the time of a colonoscopy and they did mention her seeing pathologist. He states that he would like to go ahead with the referral to hematology please.

## 2013-10-06 ENCOUNTER — Telehealth (HOSPITAL_COMMUNITY): Payer: Self-pay | Admitting: Hematology and Oncology

## 2013-10-13 ENCOUNTER — Encounter (HOSPITAL_COMMUNITY): Payer: Medicare Other | Attending: Hematology and Oncology

## 2013-10-13 ENCOUNTER — Encounter (HOSPITAL_COMMUNITY): Payer: Self-pay

## 2013-10-13 VITALS — BP 179/73 | HR 88 | Temp 98.1°F | Resp 18 | Ht <= 58 in | Wt 101.8 lb

## 2013-10-13 DIAGNOSIS — M810 Age-related osteoporosis without current pathological fracture: Secondary | ICD-10-CM | POA: Diagnosis not present

## 2013-10-13 DIAGNOSIS — D500 Iron deficiency anemia secondary to blood loss (chronic): Secondary | ICD-10-CM | POA: Diagnosis not present

## 2013-10-13 DIAGNOSIS — Q391 Atresia of esophagus with tracheo-esophageal fistula: Secondary | ICD-10-CM | POA: Diagnosis not present

## 2013-10-13 DIAGNOSIS — Z8673 Personal history of transient ischemic attack (TIA), and cerebral infarction without residual deficits: Secondary | ICD-10-CM | POA: Diagnosis not present

## 2013-10-13 DIAGNOSIS — Q2733 Arteriovenous malformation of digestive system vessel: Secondary | ICD-10-CM | POA: Insufficient documentation

## 2013-10-13 DIAGNOSIS — I10 Essential (primary) hypertension: Secondary | ICD-10-CM | POA: Insufficient documentation

## 2013-10-13 DIAGNOSIS — F17200 Nicotine dependence, unspecified, uncomplicated: Secondary | ICD-10-CM | POA: Diagnosis not present

## 2013-10-13 DIAGNOSIS — K222 Esophageal obstruction: Secondary | ICD-10-CM

## 2013-10-13 DIAGNOSIS — D509 Iron deficiency anemia, unspecified: Secondary | ICD-10-CM

## 2013-10-13 DIAGNOSIS — J449 Chronic obstructive pulmonary disease, unspecified: Secondary | ICD-10-CM | POA: Diagnosis not present

## 2013-10-13 DIAGNOSIS — Q393 Congenital stenosis and stricture of esophagus: Secondary | ICD-10-CM

## 2013-10-13 DIAGNOSIS — J4489 Other specified chronic obstructive pulmonary disease: Secondary | ICD-10-CM | POA: Insufficient documentation

## 2013-10-13 DIAGNOSIS — H9190 Unspecified hearing loss, unspecified ear: Secondary | ICD-10-CM | POA: Insufficient documentation

## 2013-10-13 DIAGNOSIS — K31819 Angiodysplasia of stomach and duodenum without bleeding: Secondary | ICD-10-CM

## 2013-10-13 DIAGNOSIS — K922 Gastrointestinal hemorrhage, unspecified: Secondary | ICD-10-CM

## 2013-10-13 LAB — CBC WITH DIFFERENTIAL/PLATELET
Basophils Absolute: 0.1 10*3/uL (ref 0.0–0.1)
Basophils Relative: 2 % — ABNORMAL HIGH (ref 0–1)
EOS PCT: 2 % (ref 0–5)
Eosinophils Absolute: 0.1 10*3/uL (ref 0.0–0.7)
HEMATOCRIT: 30.7 % — AB (ref 36.0–46.0)
Hemoglobin: 9.1 g/dL — ABNORMAL LOW (ref 12.0–15.0)
LYMPHS ABS: 1.5 10*3/uL (ref 0.7–4.0)
LYMPHS PCT: 25 % (ref 12–46)
MCH: 22.1 pg — ABNORMAL LOW (ref 26.0–34.0)
MCHC: 29.6 g/dL — ABNORMAL LOW (ref 30.0–36.0)
MCV: 74.7 fL — AB (ref 78.0–100.0)
MONO ABS: 0.4 10*3/uL (ref 0.1–1.0)
Monocytes Relative: 7 % (ref 3–12)
Neutro Abs: 3.9 10*3/uL (ref 1.7–7.7)
Neutrophils Relative %: 65 % (ref 43–77)
PLATELETS: 296 10*3/uL (ref 150–400)
RBC: 4.11 MIL/uL (ref 3.87–5.11)
RDW: 19 % — ABNORMAL HIGH (ref 11.5–15.5)
WBC: 6 10*3/uL (ref 4.0–10.5)

## 2013-10-13 LAB — IRON AND TIBC
Iron: 22 ug/dL — ABNORMAL LOW (ref 42–135)
Saturation Ratios: 5 % — ABNORMAL LOW (ref 20–55)
TIBC: 404 ug/dL (ref 250–470)
UIBC: 382 ug/dL (ref 125–400)

## 2013-10-13 LAB — RETICULOCYTES
RBC.: 4.11 MIL/uL (ref 3.87–5.11)
RETIC CT PCT: 1 % (ref 0.4–3.1)
Retic Count, Absolute: 41.1 10*3/uL (ref 19.0–186.0)

## 2013-10-13 LAB — COMPREHENSIVE METABOLIC PANEL
ALT: 14 U/L (ref 0–35)
AST: 20 U/L (ref 0–37)
Albumin: 4.4 g/dL (ref 3.5–5.2)
Alkaline Phosphatase: 129 U/L — ABNORMAL HIGH (ref 39–117)
BUN: 13 mg/dL (ref 6–23)
CO2: 27 meq/L (ref 19–32)
Calcium: 10.3 mg/dL (ref 8.4–10.5)
Chloride: 98 mEq/L (ref 96–112)
Creatinine, Ser: 0.74 mg/dL (ref 0.50–1.10)
GFR calc Af Amer: 87 mL/min — ABNORMAL LOW (ref >90)
GFR, EST NON AFRICAN AMERICAN: 75 mL/min — AB (ref >90)
Glucose, Bld: 95 mg/dL (ref 70–99)
Potassium: 4.3 mEq/L (ref 3.7–5.3)
Sodium: 138 mEq/L (ref 137–147)
Total Bilirubin: 0.3 mg/dL (ref 0.3–1.2)
Total Protein: 7.7 g/dL (ref 6.0–8.3)

## 2013-10-13 LAB — LACTATE DEHYDROGENASE: LDH: 190 U/L (ref 94–250)

## 2013-10-13 MED ORDER — SODIUM CHLORIDE 0.9 % IV SOLN
Freq: Once | INTRAVENOUS | Status: AC
  Administered 2013-10-13: 15:00:00 via INTRAVENOUS

## 2013-10-13 MED ORDER — SODIUM CHLORIDE 0.9 % IJ SOLN: 10.0000 mL | INTRAMUSCULAR | Status: DC | PRN

## 2013-10-13 MED ORDER — SODIUM CHLORIDE 0.9 % IV SOLN
1020.0000 mg | Freq: Once | INTRAVENOUS | Status: AC
  Administered 2013-10-13: 1020 mg via INTRAVENOUS
  Filled 2013-10-13: qty 34

## 2013-10-13 NOTE — Progress Notes (Signed)
Carol Massey, M.D.  NEW PATIENT EVALUATION   Name: Carol Massey Date: 10/13/2013 MRN: [id number] DOB: March 13, 1926  PCP: Carol Lange, MD   REFERRING PHYSICIAN: [name], MD  REASON FOR REFERRAL: Microcytic anemia     HISTORY OF PRESENT ILLNESS:Carol Massey is a 80 y.o. female who is referred for microcytic anemia in the setting of Schatzki's ring and gastric arteriovenous malformations. Outpatient hemoglobin done on 07/14/2013 was 8.1 with ferritin of 9. She been feeling more short of breath of late. She lives with her husband of 43 some years and her son. Appetite has been good with no nausea, vomiting, melena, hematochezia, hematuria, fever, or night sweats. She takes oral iron supplements. She denies any vaginal bleeding, hematuria, epistaxis, or hemoptysis. She was seen in his clinic several years ago with anemia but was not treated specifically with any intervention. At least she cannot remember any specific intervention.   PAST MEDICAL HISTORY:  has a past medical history of Stroke; Hypertension; Osteoporosis; COPD (chronic obstructive pulmonary disease); Anemia; and Hard of hearing.     PAST SURGICAL HISTORY: Past Surgical History  Procedure Laterality Date  . Appendectomy    . Abdominal hysterectomy    . Tonsillectomy    . Colonoscopy  03/2005    Dr. Gala Massey: rectal and rectosigmoid polyps removed  . Esophagogastroduodenoscopy  03/2005    Dr. Gala Massey: tiney distal esophageal erosions  . Small bowel capsule study  03/2005    Dr. Laural Massey: two small Dieulafoy lesions at ascending colon. tiney specks of blood at duodenal bulb but no identified lesions  . Colonoscopy with esophagogastroduodenoscopy (egd) N/A 08/12/2013    Procedure: COLONOSCOPY WITH ESOPHAGOGASTRODUODENOSCOPY (EGD);  Surgeon: Carol Dolin, MD;  [facility];  Service: Endoscopy;  Laterality: N/A;  12:30     CURRENT MEDICATIONS: has a  current medication list which includes the following prescription(s): aspirin, calcium carb-cholecalciferol, iron, OVER THE COUNTER MEDICATION, and UNABLE TO FIND, and the following Facility-Administered Medications: sodium chloride, ferumoxytol, and sodium chloride.   ALLERGIES: Review of patient's allergies indicates no known allergies.   SOCIAL HISTORY:  reports that she has been smoking.  She has never used smokeless tobacco. She reports that she does not drink alcohol or use illicit drugs.   FAMILY HISTORY: family history includes Hypertension in her mother; Leukemia (age of onset: 81) in her father; Stroke in her mother.    REVIEW OF SYSTEMS:  Other than that discussed above is noncontributory.    PHYSICAL EXAM:  height is 4' 8.25" (1.429 m) and weight is 101 lb 12.8 oz (46.176 kg). Her oral temperature is 98.1 F (36.7 C). Her blood pressure is 179/73 and her pulse is 88. Her respiration is 18 and oxygen saturation is 98%.    GENERAL:alert, no distress and comfortable SKIN: skin color, texture, turgor are normal, no rashes or significant lesions EYES: normal, Conjunctiva are pink and non-injected, sclera clear OROPHARYNX:no exudate, no erythema and lips, buccal mucosa, and tongue normal  NECK: supple, thyroid normal size, non-tender, without nodularity CHEST: Increased AP diameter with no breast masses. LYMPH:  no palpable lymphadenopathy in the cervical, axillary or inguinal LUNGS: clear to auscultation and percussion with normal breathing effort HEART: regular rate & rhythm and no murmurs ABDOMEN:abdomen soft, non-tender and normal bowel sounds MUSCULOSKELETALl:no cyanosis of digits, no clubbing or edema  NEURO: alert & oriented x 3 with fluent speech, no focal motor/sensory  deficits    LABORATORY DATA:  07/14/2013:  Hemoglobin 8.1, Ferritin 9.0   No visits with results within 30 Day(s) from this visit. Latest known visit with results is:  Clinical Support on  07/20/2013  Component Date Value Ref Range Status  . IFOBT 07/20/2013 Negative   Final    Urinalysis No results found for this basename: colorurine,  appearanceur,  labspec,  phurine,  glucoseu,  hgbur,  bilirubinur,  ketonesur,  proteinur,  urobilinogen,  nitrite,  leukocytesur      @RADIOGRAPHY :           PATHOLOGY: for Carol Massey ([id number]) Patient: Carol Massey Collected: 08/12/2013 Client: [facility] Accession: [id number] Received: 08/13/2013 Carol Massey DOB: Jul 09, 1926 Age: 53 Gender: F Reported: 08/16/2013 [address] Patient Ph: [phone] MRN #: [id number] Carol Massey [id number] Visit #: [id number] Chart #: Phone: [phone] Fax: CC: REPORT OF SURGICAL PATHOLOGY FINAL DIAGNOSIS Diagnosis Colon, polyp(s), cecal - TUBULAR ADENOMA. NO HIGH GRADE DYSPLASIA OR MALIGNANCY IDENTIFIED. Carol Laws MD Pathologist, Electronic Signature (Case signed 08/16/2013) Specimen Gross and Clinical Information Specimen(s) Obtained: Colon, polyp(s), cecal Specimen Clinical Information Pre-op: microcytic anemia; Post-op: cecal polyp; left sided diverticulosis; upper: Schatzki's ring; hiatal hernia, gastric AVM Gross Received in formalin are tan, soft tissue fragments that are submitted in toto. Number: two Size: 0.2 cm and 1.2 x 0.3 x 0.2 cm. (KL:caf 08/13/13) Report signed out from the following location(s) Technical Component performed at [address], [place] [address] CLIA: [id number]., Interpretation performed at [address], [place], [place] [address]. CLIA #: [id number],    IMPRESSION:  #1. 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 #2. Hypertension, controlled. #3. 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 #4. Chronic obstructive pulmonary disease, still smoking 3 or 4 cigarettes per day.   PLAN:  #1. Intravenous Feraheme  1020 mg today. #2. Followup in 6 weeks with CBC and ferritin. #3. Patient will probably require intravenous iron at various intervals in the future to be determined.  I appreciate the opportunity of sharing in her care.   Doroteo Bradford, MD 10/13/2013 2:50 PM

## 2013-10-13 NOTE — Patient Instructions (Addendum)
[facility] Discharge Instructions  RECOMMENDATIONS MADE BY THE CONSULTANT AND ANY TEST RESULTS WILL BE SENT TO YOUR REFERRING PHYSICIAN.  EXAM FINDINGS BY THE PHYSICIAN TODAY AND SIGNS OR SYMPTOMS TO REPORT TO CLINIC OR PRIMARY PHYSICIAN: Exam and findings as discussed by Dr. Barnet Glasgow.  Will check some blood work today and give you a feraheme infusion.  Report increased fatigue, shortness of breath or other problems.  MEDICATIONS PRESCRIBED:  none  INSTRUCTIONS/FOLLOW-UP: Follow-up in 6 weeks with labs and office visit.  Thank you for choosing [facility] to provide your oncology and hematology care.  To afford each patient quality time with our providers, please arrive at least 15 minutes before your scheduled appointment time.  With your help, our goal is to use those 15 minutes to complete the necessary work-up to ensure our physicians have the information they need to help with your evaluation and healthcare recommendations.    Effective January 1st, 2014, we ask that you re-schedule your appointment with our physicians should you arrive 10 or more minutes late for your appointment.  We strive to give you quality time with our providers, and arriving late affects you and other patients whose appointments are after yours.    Again, thank you for choosing [facility].  Our hope is that these requests will decrease the amount of time that you wait before being seen by our physicians.       _____________________________________________________________  Should you have questions after your visit to [facility], please contact our office at [phone] between the hours of 8:30 a.m. and 5:00 p.m.  Voicemails left after 4:30 p.m. will not be returned until the following business day.  For prescription refill requests, have your pharmacy contact our office with your prescription refill request.

## 2013-10-13 NOTE — Progress Notes (Signed)
Tolerated infusion well. 

## 2013-10-14 LAB — FERRITIN: Ferritin: 11 ng/mL (ref 10–291)

## 2013-10-14 LAB — FOLATE

## 2013-10-14 LAB — VITAMIN B12: VITAMIN B 12: 861 pg/mL (ref 211–911)

## 2013-10-15 LAB — HEMOGLOBINOPATHY EVALUATION
HGB S QUANTITAION: 0 %
Hemoglobin Other: 0 %
Hgb A2 Quant: 1.8 % — ABNORMAL LOW (ref 2.2–3.2)
Hgb A: 98.2 % — ABNORMAL HIGH (ref 96.8–97.8)
Hgb F Quant: 0 % (ref 0.0–2.0)

## 2013-11-09 ENCOUNTER — Ambulatory Visit (HOSPITAL_COMMUNITY)
Admission: RE | Admit: 2013-11-09 | Discharge: 2013-11-09 | Disposition: A | Discharge status: Home / Self Care | Payer: Medicare Other | Source: Ambulatory Visit | Attending: Family Medicine | Admitting: Family Medicine

## 2013-11-09 ENCOUNTER — Encounter: Payer: Self-pay | Admitting: Family Medicine

## 2013-11-09 ENCOUNTER — Ambulatory Visit (INDEPENDENT_AMBULATORY_CARE_PROVIDER_SITE_OTHER): Payer: Medicare Other | Admitting: Family Medicine

## 2013-11-09 VITALS — BP 140/80 | Temp 98.5°F | Ht <= 58 in | Wt 100.5 lb

## 2013-11-09 DIAGNOSIS — J449 Chronic obstructive pulmonary disease, unspecified: Secondary | ICD-10-CM

## 2013-11-09 DIAGNOSIS — J438 Other emphysema: Secondary | ICD-10-CM | POA: Insufficient documentation

## 2013-11-09 DIAGNOSIS — J441 Chronic obstructive pulmonary disease with (acute) exacerbation: Secondary | ICD-10-CM

## 2013-11-09 MED ORDER — PREDNISONE 20 MG PO TABS: ORAL_TABLET | ORAL | Status: AC

## 2013-11-09 MED ORDER — ALBUTEROL SULFATE HFA 108 (90 BASE) MCG/ACT IN AERS: 2.0000 | INHALATION_SPRAY | Freq: Four times a day (QID) | RESPIRATORY_TRACT | Status: DC | PRN

## 2013-11-09 MED ORDER — AZITHROMYCIN 250 MG PO TABS: ORAL_TABLET | ORAL | Status: DC

## 2013-11-09 NOTE — Progress Notes (Signed)
   Subjective:    Patient ID: Carol Massey, female    DOB: [birth year], 80 y.o.   MRN: [id number]  Cough This is a new problem. The current episode started yesterday. The problem has been gradually worsening. The cough is non-productive. Associated symptoms include rhinorrhea, shortness of breath and wheezing. Pertinent negatives include no chest pain, ear pain or fever. Nothing aggravates the symptoms. She has tried nothing for the symptoms. The treatment provided no relief.  Patient has no other concerns at this time.   PMH COPD osteoporosis hyperlipidemia  Review of Systems  Constitutional: Negative for fever and activity change.  HENT: Positive for congestion and rhinorrhea. Negative for ear pain.   Eyes: Negative for discharge.  Respiratory: Positive for cough, shortness of breath and wheezing.   Cardiovascular: Negative for chest pain.       Objective:   Physical Exam  Nursing note and vitals reviewed. Constitutional: She appears well-developed.  HENT:  Head: Normocephalic.  Nose: Nose normal.  Mouth/Throat: Oropharynx is clear and moist. No oropharyngeal exudate.  Neck: Neck supple.  Cardiovascular: Normal rate and normal heart sounds.   No murmur heard. Pulmonary/Chest: Effort normal. She has wheezes.  Lymphadenopathy:    She has no cervical adenopathy.  Skin: Skin is warm and dry.          Assessment & Plan:  COPD exacerbation along with bronchitis-medications prescribed chest x-ray ordered the patient not improved over the next couple weeks to followup warning signs discussed

## 2013-11-24 ENCOUNTER — Encounter (HOSPITAL_COMMUNITY): Payer: Self-pay

## 2013-11-24 ENCOUNTER — Encounter (HOSPITAL_COMMUNITY): Payer: Medicare Other | Attending: Hematology and Oncology

## 2013-11-24 ENCOUNTER — Encounter (HOSPITAL_BASED_OUTPATIENT_CLINIC_OR_DEPARTMENT_OTHER): Payer: Medicare Other

## 2013-11-24 VITALS — BP 168/84 | HR 70 | Temp 97.9°F | Resp 18 | Wt 99.9 lb

## 2013-11-24 DIAGNOSIS — Q2733 Arteriovenous malformation of digestive system vessel: Secondary | ICD-10-CM | POA: Diagnosis not present

## 2013-11-24 DIAGNOSIS — F17200 Nicotine dependence, unspecified, uncomplicated: Secondary | ICD-10-CM | POA: Diagnosis not present

## 2013-11-24 DIAGNOSIS — D500 Iron deficiency anemia secondary to blood loss (chronic): Secondary | ICD-10-CM | POA: Insufficient documentation

## 2013-11-24 DIAGNOSIS — J309 Allergic rhinitis, unspecified: Secondary | ICD-10-CM

## 2013-11-24 DIAGNOSIS — K222 Esophageal obstruction: Secondary | ICD-10-CM

## 2013-11-24 DIAGNOSIS — K31819 Angiodysplasia of stomach and duodenum without bleeding: Secondary | ICD-10-CM

## 2013-11-24 LAB — CBC WITH DIFFERENTIAL/PLATELET
BASOS PCT: 1 % (ref 0–1)
Basophils Absolute: 0.1 10*3/uL (ref 0.0–0.1)
Eosinophils Absolute: 0.2 10*3/uL (ref 0.0–0.7)
Eosinophils Relative: 3 % (ref 0–5)
HCT: 40 % (ref 36.0–46.0)
Hemoglobin: 12.9 g/dL (ref 12.0–15.0)
LYMPHS ABS: 1.6 10*3/uL (ref 0.7–4.0)
Lymphocytes Relative: 23 % (ref 12–46)
MCH: 27.9 pg (ref 26.0–34.0)
MCHC: 32.3 g/dL (ref 30.0–36.0)
MCV: 86.6 fL (ref 78.0–100.0)
Monocytes Absolute: 0.6 10*3/uL (ref 0.1–1.0)
Monocytes Relative: 8 % (ref 3–12)
NEUTROS PCT: 65 % (ref 43–77)
Neutro Abs: 4.4 10*3/uL (ref 1.7–7.7)
PLATELETS: 232 10*3/uL (ref 150–400)
RBC: 4.62 MIL/uL (ref 3.87–5.11)
WBC: 6.8 10*3/uL (ref 4.0–10.5)

## 2013-11-24 LAB — FERRITIN: Ferritin: 117 ng/mL (ref 10–291)

## 2013-11-24 NOTE — Progress Notes (Signed)
[facility]  OFFICE PROGRESS NOTE  LUKING,SCOTT, MD [address] [place] [address]  DIAGNOSIS: Iron deficiency anemia secondary to blood loss (chronic) - Plan: CBC with Differential, Ferritin  Arteriovenous malformation of stomach - Plan: CBC with Differential, Ferritin  Schatzki's ring  Allergic rhinitis  Chief Complaint  Patient presents with  . Iron deficiency anemia    CURRENT THERAPY: Intravenous Feraheme on 10/13/2013  INTERVAL HISTORY: Carol Massey 80 y.o. female returns for followup of iron deficiency secondary to chronic blood loss from AV malformations of the intestine, status post IV iron on 10/13/2013.  She feels less chilly since undergoing iron infusion in February. She denies any melena, hematochezia, hematuria, but did experience a recent bout of bronchitis which initially with antibiotics and steroids. Appetite is good with no nausea, vomiting, diarrhea, constipation, lower extremity swelling or redness, chest pain, PND, orthopnea, palpitations, headache, or seizures.   MEDICAL HISTORY: Past Medical History  Diagnosis Date  . Stroke   . Hypertension   . Osteoporosis   . COPD (chronic obstructive pulmonary disease)   . Anemia   . Hard of hearing     INTERIM HISTORY: has COPD (chronic obstructive pulmonary disease); Osteoporosis, unspecified; Hyperlipemia; and Microcytic anemia on her problem list.    ALLERGIES:  has No Known Allergies.  MEDICATIONS: has a current medication list which includes the following prescription(s): aspirin, calcium carb-cholecalciferol, iron, UNABLE TO FIND, albuterol, azithromycin, and OVER THE COUNTER MEDICATION.  SURGICAL HISTORY:  Past Surgical History  Procedure Laterality Date  . Appendectomy    . Abdominal hysterectomy    . Tonsillectomy    . Colonoscopy  03/2005    Dr. Gala Romney: rectal and rectosigmoid polyps removed  . Esophagogastroduodenoscopy  03/2005    Dr.  Gala Romney: tiney distal esophageal erosions  . Small bowel capsule study  03/2005    Dr. Laural Golden: two small Dieulafoy lesions at ascending colon. tiney specks of blood at duodenal bulb but no identified lesions  . Colonoscopy with esophagogastroduodenoscopy (egd) N/A 08/12/2013    Procedure: COLONOSCOPY WITH ESOPHAGOGASTRODUODENOSCOPY (EGD);  Surgeon: Daneil Dolin, MD;  Location: [facility];  Service: Endoscopy;  Laterality: N/A;  12:30    FAMILY HISTORY: family history includes Hypertension in her mother; Leukemia (age of onset: 51) in her father; Stroke in her mother.  SOCIAL HISTORY:  reports that she has been smoking.  She has never used smokeless tobacco. She reports that she does not drink alcohol or use illicit drugs.  REVIEW OF SYSTEMS:  Other than that discussed above is noncontributory.  PHYSICAL EXAMINATION: ECOG PERFORMANCE STATUS: 1 - Symptomatic but completely ambulatory  Blood pressure 168/84, pulse 70, temperature 97.9 F (36.6 C), temperature source Oral, resp. rate 18, weight 99 lb 14.4 oz (45.314 kg), SpO2 99.00%.  GENERAL:alert, no distress and comfortable SKIN: skin color, texture, turgor are normal, no rashes or significant lesions EYES: PERLA; Conjunctiva are pink and non-injected, sclera clear SINUSES: No redness or tenderness over maxillary or ethmoid sinuses OROPHARYNX:no exudate, no erythema on lips, buccal mucosa, or tongue. NECK: supple, thyroid normal size, non-tender, without nodularity. No masses CHEST:  Increased AP diameter with scoliosis convex to the right. LYMPH:  no palpable lymphadenopathy in the cervical, axillary or inguinal LUNGS: clear to auscultation and percussion. Breath sounds are distant bilaterally with no crackles or wheeze.  HEART: regular rate & rhythm and no murmurs. ABDOMEN:abdomen soft, non-tender and normal bowel sounds MUSCULOSKELETAL:no  cyanosis of digits and no clubbing. Range of motion normal.  NEURO: alert & oriented x 3 with  fluent speech, no focal motor/sensory deficits   LABORATORY DATA: Appointment on 11/24/2013  Component Date Value Ref Range Status  . WBC 11/24/2013 6.8  4.0 - 10.5 K/uL Final  . RBC 11/24/2013 4.62  3.87 - 5.11 MIL/uL Final  . Hemoglobin 11/24/2013 12.9  12.0 - 15.0 g/dL Final  . HCT 11/24/2013 40.0  36.0 - 46.0 % Final  . MCV 11/24/2013 86.6  78.0 - 100.0 fL Final  . MCH 11/24/2013 27.9  26.0 - 34.0 pg Final  . MCHC 11/24/2013 32.3  30.0 - 36.0 g/dL Final  . Platelets 11/24/2013 232  150 - 400 K/uL Final  . Neutrophils Relative % 11/24/2013 65  43 - 77 % Final  . Neutro Abs 11/24/2013 4.4  1.7 - 7.7 K/uL Final  . Lymphocytes Relative 11/24/2013 23  12 - 46 % Final  . Lymphs Abs 11/24/2013 1.6  0.7 - 4.0 K/uL Final  . Monocytes Relative 11/24/2013 8  3 - 12 % Final  . Monocytes Absolute 11/24/2013 0.6  0.1 - 1.0 K/uL Final  . Eosinophils Relative 11/24/2013 3  0 - 5 % Final  . Eosinophils Absolute 11/24/2013 0.2  0.0 - 0.7 K/uL Final  . Basophils Relative 11/24/2013 1  0 - 1 % Final  . Basophils Absolute 11/24/2013 0.1  0.0 - 0.1 K/uL Final  . RBC Morphology 11/24/2013 ELLIPTOCYTES   Final   TARGET CELLS    PATHOLOGY: no new pathology.  Urinalysis No results found for this basename: colorurine,  appearanceur,  labspec,  phurine,  glucoseu,  hgbur,  bilirubinur,  ketonesur,  proteinur,  urobilinogen,  nitrite,  leukocytesur    RADIOGRAPHIC STUDIES: Dg Chest 2 View  11/09/2013   CLINICAL DATA:  Cough, congestion  EXAM: CHEST  2 VIEW  COMPARISON:  Chest x-ray of 03/15/2013  FINDINGS: The lungs remain hyperaerated with flattened hemidiaphragms and increased AP diameter consistent with emphysema. No active infiltrate or effusion is seen. The heart is mildly enlarged and stable. The bones are osteopenic.  IMPRESSION: Emphysema.  No active lung disease.   Electronically Signed   By: Ivar Drape M.D.   On: 11/09/2013 11:24    ASSESSMENT:  #1. Iron deficiency anemia secondary to  chronic blood loss from gastric arteriovenous malformations, status post iron infusion which she tolerated well, hemoglobin improved from 9.1 to 12.9. #2. Hypertension, controlled.  #3. 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  #4. Chronic obstructive pulmonary disease, still smoking 3 or 4 cigarettes per day.    PLAN:  #1. Call for increasing fatigue to check CBC. #2. Followup in 3 months with CBC and ferritin. Goal is to maintain normal hemoglobin by continuing to replenish her iron stores with intravenous iron infusions.   All questions were answered. The patient knows to call the clinic with any problems, questions or concerns. We can certainly see the patient much sooner if necessary.   I spent 25 minutes counseling the patient face to face. The total time spent in the appointment was 30 minutes.    Doroteo Bradford, MD 11/24/2013 11:45 AM

## 2013-11-24 NOTE — Patient Instructions (Signed)
[facility] Discharge Instructions  RECOMMENDATIONS MADE BY THE CONSULTANT AND ANY TEST RESULTS WILL BE SENT TO YOUR REFERRING PHYSICIAN. We will call you with today's lab results. We will see you in 3 months for labs and a doctor visit.   Thank you for choosing [facility] to provide your oncology and hematology care.  To afford each patient quality time with our providers, please arrive at least 15 minutes before your scheduled appointment time.  With your help, our goal is to use those 15 minutes to complete the necessary work-up to ensure our physicians have the information they need to help with your evaluation and healthcare recommendations.    Effective January 1st, 2014, we ask that you re-schedule your appointment with our physicians should you arrive 10 or more minutes late for your appointment.  We strive to give you quality time with our providers, and arriving late affects you and other patients whose appointments are after yours.    Again, thank you for choosing [facility].  Our hope is that these requests will decrease the amount of time that you wait before being seen by our physicians.       _____________________________________________________________  Should you have questions after your visit to [facility], please contact our office at [phone] between the hours of 8:30 a.m. and 5:00 p.m.  Voicemails left after 4:30 p.m. will not be returned until the following business day.  For prescription refill requests, have your pharmacy contact our office with your prescription refill request.

## 2013-11-26 NOTE — Progress Notes (Signed)
Labs drawn via PIV stick.  

## 2013-11-30 ENCOUNTER — Encounter: Payer: Self-pay | Admitting: Family Medicine

## 2013-11-30 ENCOUNTER — Ambulatory Visit (INDEPENDENT_AMBULATORY_CARE_PROVIDER_SITE_OTHER): Payer: Medicare Other | Admitting: Family Medicine

## 2013-11-30 VITALS — BP 112/78 | Ht <= 58 in | Wt 101.0 lb

## 2013-11-30 DIAGNOSIS — J441 Chronic obstructive pulmonary disease with (acute) exacerbation: Secondary | ICD-10-CM | POA: Diagnosis not present

## 2013-11-30 DIAGNOSIS — J449 Chronic obstructive pulmonary disease, unspecified: Secondary | ICD-10-CM | POA: Diagnosis not present

## 2013-11-30 MED ORDER — PREDNISONE 20 MG PO TABS: ORAL_TABLET | ORAL | Status: AC

## 2013-11-30 MED ORDER — CEFPROZIL 500 MG PO TABS: 500.0000 mg | ORAL_TABLET | Freq: Two times a day (BID) | ORAL | Status: DC

## 2013-11-30 NOTE — Progress Notes (Signed)
   Subjective:    Patient ID: Carol Massey, female    DOB: 1926/03/24, 80 y.o.   MRN: [id number]  HPI Patient is here today for a regular check up.  The only concern she has is a cough that will not go away. It comes and goes. Patient still smokes sometimes quite a bit other times just a few cigarettes a day denies any hemoptysis. Had recent x-ray no masses were seen. Patient does get a little short of breath when the cough flares up it recently flared up this past weekend. She denies any other particular troubles. She is followed by oncology been treated for iron deficient anemia. So far this is helping out. Iron deficient anemia is due to AV malformation.   Review of Systems Some cough some shortness breath denies chest pain nausea vomiting fevers.    Objective:   Physical Exam  Lungs are clear hearts regular neck no masses pulse normal BP good on recheck      Assessment & Plan:  #1 probable COPD patient does not want to do pulmonary function testing she's been advised to quit smoking #2 COPD exacerbation prednisone taper antibiotics for secondary is bronchitis #3 iron deficient anemia followup with specialist followup here in 3 months

## 2014-01-14 ENCOUNTER — Encounter (HOSPITAL_COMMUNITY): Payer: Self-pay | Admitting: Oncology

## 2014-02-03 ENCOUNTER — Encounter: Payer: Self-pay | Admitting: Family Medicine

## 2014-02-03 ENCOUNTER — Ambulatory Visit (INDEPENDENT_AMBULATORY_CARE_PROVIDER_SITE_OTHER): Payer: Medicare Other | Admitting: Family Medicine

## 2014-02-03 VITALS — BP 130/80 | Temp 98.6°F | Ht <= 58 in | Wt 99.2 lb

## 2014-02-03 DIAGNOSIS — J209 Acute bronchitis, unspecified: Secondary | ICD-10-CM

## 2014-02-03 DIAGNOSIS — J4489 Other specified chronic obstructive pulmonary disease: Secondary | ICD-10-CM

## 2014-02-03 DIAGNOSIS — J449 Chronic obstructive pulmonary disease, unspecified: Secondary | ICD-10-CM

## 2014-02-03 MED ORDER — AZITHROMYCIN 250 MG PO TABS: ORAL_TABLET | ORAL | Status: DC

## 2014-02-03 MED ORDER — BENZONATATE 100 MG PO CAPS: 200.0000 mg | ORAL_CAPSULE | Freq: Three times a day (TID) | ORAL | Status: DC | PRN

## 2014-02-03 NOTE — Progress Notes (Signed)
   Subjective:    Patient ID: Carol Massey, female    DOB: [birth year], 80 y.o.   MRN: [id number]  Sore Throat  This is a new problem. The current episode started in the past 7 days. The problem has been unchanged. Neither side of throat is experiencing more pain than the other. There has been no fever. The pain is moderate. Associated symptoms include congestion, coughing and a hoarse voice. She has tried nothing for the symptoms. The treatment provided no relief.   Patient states she has no other concerns at this time.  She relates some shortness of breath with activity denies high fever chills vomiting diarrhea  Review of Systems  HENT: Positive for congestion and hoarse voice.   Respiratory: Positive for cough.        Objective:   Physical Exam Lungs are clear heart is regular head congestion noted throat is normal eardrums normal       Assessment & Plan:  Patient has COPD along with upper respiratory illness in addition to this acute bronchitis high risk of bacterial infection go ahead and start antibiotics warning signs discussed followup if problems

## 2014-02-23 ENCOUNTER — Encounter (HOSPITAL_COMMUNITY): Payer: Medicare Other | Attending: Hematology and Oncology

## 2014-02-23 ENCOUNTER — Encounter (HOSPITAL_BASED_OUTPATIENT_CLINIC_OR_DEPARTMENT_OTHER): Payer: Medicare Other

## 2014-02-23 ENCOUNTER — Encounter (HOSPITAL_COMMUNITY): Payer: Self-pay

## 2014-02-23 ENCOUNTER — Other Ambulatory Visit (HOSPITAL_COMMUNITY): Payer: Self-pay | Admitting: Hematology and Oncology

## 2014-02-23 VITALS — BP 186/71 | HR 71 | Temp 98.4°F | Resp 18 | Wt 100.0 lb

## 2014-02-23 DIAGNOSIS — K5521 Angiodysplasia of colon with hemorrhage: Secondary | ICD-10-CM

## 2014-02-23 DIAGNOSIS — Q2733 Arteriovenous malformation of digestive system vessel: Secondary | ICD-10-CM | POA: Insufficient documentation

## 2014-02-23 DIAGNOSIS — D500 Iron deficiency anemia secondary to blood loss (chronic): Secondary | ICD-10-CM

## 2014-02-23 DIAGNOSIS — F17200 Nicotine dependence, unspecified, uncomplicated: Secondary | ICD-10-CM

## 2014-02-23 DIAGNOSIS — J449 Chronic obstructive pulmonary disease, unspecified: Secondary | ICD-10-CM

## 2014-02-23 DIAGNOSIS — K31819 Angiodysplasia of stomach and duodenum without bleeding: Secondary | ICD-10-CM

## 2014-02-23 DIAGNOSIS — K222 Esophageal obstruction: Secondary | ICD-10-CM

## 2014-02-23 LAB — CBC WITH DIFFERENTIAL/PLATELET
Basophils Absolute: 0.1 10*3/uL (ref 0.0–0.1)
Basophils Relative: 1 % (ref 0–1)
EOS ABS: 0.1 10*3/uL (ref 0.0–0.7)
Eosinophils Relative: 2 % (ref 0–5)
HCT: 33.7 % — ABNORMAL LOW (ref 36.0–46.0)
Hemoglobin: 10.8 g/dL — ABNORMAL LOW (ref 12.0–15.0)
LYMPHS ABS: 1.5 10*3/uL (ref 0.7–4.0)
LYMPHS PCT: 25 % (ref 12–46)
MCH: 29.3 pg (ref 26.0–34.0)
MCHC: 32 g/dL (ref 30.0–36.0)
MCV: 91.6 fL (ref 78.0–100.0)
MONOS PCT: 10 % (ref 3–12)
Monocytes Absolute: 0.6 10*3/uL (ref 0.1–1.0)
NEUTROS PCT: 62 % (ref 43–77)
Neutro Abs: 3.6 10*3/uL (ref 1.7–7.7)
PLATELETS: 251 10*3/uL (ref 150–400)
RBC: 3.68 MIL/uL — AB (ref 3.87–5.11)
RDW: 13.3 % (ref 11.5–15.5)
WBC: 5.9 10*3/uL (ref 4.0–10.5)

## 2014-02-23 LAB — FERRITIN: FERRITIN: 17 ng/mL (ref 10–291)

## 2014-02-23 NOTE — Progress Notes (Signed)
[facility]  OFFICE PROGRESS NOTE  LUKING,SCOTT, MD [address] [place] [address]  DIAGNOSIS: Iron deficiency anemia secondary to blood loss (chronic)  Arteriovenous malformation of stomach  Schatzki's ring  Chief Complaint  Patient presents with  . Chronic blood loss anemia    Intestinal AVMs  . Follow-up    CURRENT THERAPY: Intravenous Feraheme 10/13/2013  INTERVAL HISTORY: Carol Massey 80 y.o. female returns for followup of iron deficiency secondary to chronic blood loss from AV malformations of the intestine, status post intravenous Feraheme on 10/13/2013.  Apparently patient received correspondence from Medicare stating that she can no longer get iron infusions? Appetite is good with no nausea, vomiting, diarrhea, constipation, melena, hematochezia, hematuria, hemoptysis, but with chronic cough. She still smokes 3 or 4 cigarettes per day. She denies any lower extremity swelling or redness, skin rash, headache, or seizures.  MEDICAL HISTORY: Past Medical History  Diagnosis Date  . Stroke   . Hypertension   . Osteoporosis   . COPD (chronic obstructive pulmonary disease)   . Anemia   . Hard of hearing     INTERIM HISTORY: has COPD (chronic obstructive pulmonary disease); Osteoporosis, unspecified; Hyperlipemia; and Microcytic anemia on her problem list.   l; ALLERGIES:  has No Known Allergies.  MEDICATIONS: has a current medication list which includes the following prescription(s): aspirin, calcium carb-cholecalciferol, iron, UNABLE TO FIND, albuterol, and OVER THE COUNTER MEDICATION.  SURGICAL HISTORY:  Past Surgical History  Procedure Laterality Date  . Appendectomy    . Abdominal hysterectomy    . Tonsillectomy    . Colonoscopy  03/2005    Dr. Gala Romney: rectal and rectosigmoid polyps removed  . Esophagogastroduodenoscopy  03/2005    Dr. Gala Romney: tiney distal esophageal erosions  . Small bowel capsule study   03/2005    Dr. Laural Golden: two small Dieulafoy lesions at ascending colon. tiney specks of blood at duodenal bulb but no identified lesions  . Colonoscopy with esophagogastroduodenoscopy (egd) N/A 08/12/2013    Procedure: COLONOSCOPY WITH ESOPHAGOGASTRODUODENOSCOPY (EGD);  Surgeon: Daneil Dolin, MD;  Location: [facility];  Service: Endoscopy;  Laterality: N/A;  12:30    FAMILY HISTORY: family history includes Hypertension in her mother; Leukemia (age of onset: 23) in her father; Stroke in her mother.  SOCIAL HISTORY:  reports that she has been smoking.  She has never used smokeless tobacco. She reports that she does not drink alcohol or use illicit drugs.  REVIEW OF SYSTEMS:  Other than that discussed above is noncontributory.  PHYSICAL EXAMINATION: ECOG PERFORMANCE STATUS: 1 - Symptomatic but completely ambulatory  Blood pressure 186/71, pulse 71, temperature 98.4 F (36.9 C), temperature source Oral, resp. rate 18, weight 100 lb (45.36 kg).  GENERAL:alert, no distress and comfortable SKIN: skin color, texture, turgor are normal, no rashes or significant lesions EYES: PERLA; Conjunctiva are pink and non-injected, sclera clear SINUSES: No redness or tenderness over maxillary or ethmoid sinuses OROPHARYNX:no exudate, no erythema on lips, buccal mucosa, or tongue. NECK: supple, thyroid normal size, non-tender, without nodularity. No masses CHEST: Kyphosis with increased AP diameter. No breast masses. LYMPH:  no palpable lymphadenopathy in the cervical, axillary or inguinal LUNGS: clear to auscultation and percussion with normal breathing effort HEART: regular rate & rhythm and no murmurs. ABDOMEN:abdomen soft, non-tender and normal bowel sounds MUSCULOSKELETAL:no cyanosis of digits and no clubbing. Range of motion normal.  NEURO: alert & oriented x 3 with fluent speech,  no focal motor/sensory deficits. Hearing deficit.   LABORATORY DATA: Lab on 02/23/2014  Component Date Value Ref  Range Status  . WBC 02/23/2014 5.9  4.0 - 10.5 K/uL Final  . RBC 02/23/2014 3.68* 3.87 - 5.11 MIL/uL Final  . Hemoglobin 02/23/2014 10.8* 12.0 - 15.0 g/dL Final  . HCT 02/23/2014 33.7* 36.0 - 46.0 % Final  . MCV 02/23/2014 91.6  78.0 - 100.0 fL Final  . MCH 02/23/2014 29.3  26.0 - 34.0 pg Final  . MCHC 02/23/2014 32.0  30.0 - 36.0 g/dL Final  . RDW 02/23/2014 13.3  11.5 - 15.5 % Final  . Platelets 02/23/2014 251  150 - 400 K/uL Final  . Neutrophils Relative % 02/23/2014 62  43 - 77 % Final  . Neutro Abs 02/23/2014 3.6  1.7 - 7.7 K/uL Final  . Lymphocytes Relative 02/23/2014 25  12 - 46 % Final  . Lymphs Abs 02/23/2014 1.5  0.7 - 4.0 K/uL Final  . Monocytes Relative 02/23/2014 10  3 - 12 % Final  . Monocytes Absolute 02/23/2014 0.6  0.1 - 1.0 K/uL Final  . Eosinophils Relative 02/23/2014 2  0 - 5 % Final  . Eosinophils Absolute 02/23/2014 0.1  0.0 - 0.7 K/uL Final  . Basophils Relative 02/23/2014 1  0 - 1 % Final  . Basophils Absolute 02/23/2014 0.1  0.0 - 0.1 K/uL Final    PATHOLOGY: No new pathology.  Urinalysis No results found for this basename: colorurine,  appearanceur,  labspec,  phurine,  glucoseu,  hgbur,  bilirubinur,  ketonesur,  proteinur,  urobilinogen,  nitrite,  leukocytesur    RADIOGRAPHIC STUDIES:       ASSESSMENT:  #1. Iron deficiency anemia secondary to chronic blood loss from gastric arteriovenous malformations, status post iron infusion which she tolerated well, awaiting today's ferritin report. #2. Hypertension, controlled.  #3. 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  #4. Chronic obstructive pulmonary disease, still smoking 3 or 4 cigarettes per day    PLAN:  #1. If ferritin is low, additional intravenous iron will be given making sure that third party coverage is obtained. #2. Office visit 3 months with CBC and ferritin.   All questions were answered. The patient knows to call the clinic with any problems, questions or concerns. We  can certainly see the patient much sooner if necessary.   I spent 25 minutes counseling the patient face to face. The total time spent in the appointment was 30 minutes.    Doroteo Bradford, MD 02/23/2014 9:27 AM  DISCLAIMER:  This note was dictated with voice recognition software.  Similar sounding words can inadvertently be transcribed inaccurately and may not be corrected upon review.

## 2014-02-23 NOTE — Progress Notes (Signed)
LABS DRAWN FOR CBCD,FERR  

## 2014-02-23 NOTE — Patient Instructions (Signed)
..  [facility] Discharge Instructions  RECOMMENDATIONS MADE BY THE CONSULTANT AND ANY TEST RESULTS WILL BE SENT TO YOUR REFERRING PHYSICIAN.  EXAM FINDINGS BY THE PHYSICIAN TODAY AND SIGNS OR SYMPTOMS TO REPORT TO CLINIC OR PRIMARY PHYSICIAN: Exam and findings as discussed by Dr. Barnet Glasgow.   INSTRUCTIONS/FOLLOW-UP: Labs today and in 3 months with a Dr. visit  Thank you for choosing [facility] to provide your oncology and hematology care.  To afford each patient quality time with our providers, please arrive at least 15 minutes before your scheduled appointment time.  With your help, our goal is to use those 15 minutes to complete the necessary work-up to ensure our physicians have the information they need to help with your evaluation and healthcare recommendations.    Effective January 1st, 2014, we ask that you re-schedule your appointment with our physicians should you arrive 10 or more minutes late for your appointment.  We strive to give you quality time with our providers, and arriving late affects you and other patients whose appointments are after yours.    Again, thank you for choosing [facility].  Our hope is that these requests will decrease the amount of time that you wait before being seen by our physicians.       _____________________________________________________________  Should you have questions after your visit to [facility], please contact our office at [phone] between the hours of 8:30 a.m. and 4:30 p.m.  Voicemails left after 4:30 p.m. will not be returned until the following business day.  For prescription refill requests, have your pharmacy contact our office with your prescription refill request.    _______________________________________________________________  We hope that we have given you very good care.  You may receive a patient satisfaction survey in the mail, please complete it and return it  as soon as possible.  We value your feedback!  _______________________________________________________________  Have you asked about our STAR program?  STAR stands for Survivorship Training and Rehabilitation, and this is a nationally recognized cancer care program that focuses on survivorship and rehabilitation.  Cancer and cancer treatments may cause problems, such as, pain, making you feel tired and keeping you from doing the things that you need or want to do. Cancer rehabilitation can help. Our goal is to reduce these troubling effects and help you have the best quality of life possible.  You may receive a survey from a nurse that asks questions about your current state of health.  Based on the survey results, all eligible patients will be referred to the [facility] program for an evaluation so we can better serve you!  A frequently asked questions sheet is available upon request.

## 2014-03-11 ENCOUNTER — Ambulatory Visit (INDEPENDENT_AMBULATORY_CARE_PROVIDER_SITE_OTHER): Payer: Medicare Other | Admitting: Family Medicine

## 2014-03-11 ENCOUNTER — Encounter: Payer: Self-pay | Admitting: Family Medicine

## 2014-03-11 VITALS — BP 130/68 | Ht <= 58 in | Wt 99.0 lb

## 2014-03-11 DIAGNOSIS — Z23 Encounter for immunization: Secondary | ICD-10-CM

## 2014-03-11 DIAGNOSIS — J449 Chronic obstructive pulmonary disease, unspecified: Secondary | ICD-10-CM

## 2014-03-11 MED ORDER — ALBUTEROL SULFATE HFA 108 (90 BASE) MCG/ACT IN AERS: 2.0000 | INHALATION_SPRAY | Freq: Four times a day (QID) | RESPIRATORY_TRACT | Status: DC | PRN

## 2014-03-11 NOTE — Progress Notes (Signed)
   Subjective:    Patient ID: Carol Massey, female    DOB: 25-Mar-1926, 80 y.o.   MRN: [id number]  HPI  Patient arrives to follow up after recent visit to the blood doctor.  She states her hgb had come up after taking the IV Fe and she is taking OTC iron and follow up with him in Oct.  She has a history of COPD as well as smoking. She's been counseled to quit and does not want to  Review of Systems A little shortness of breath with significant activity but denies chest tightness pressure pain or shortness of breath with daily activities    Objective:   Physical Exam Lungs clear heart regular pulse normal extremities no edema       Assessment & Plan:  Anemia iron deficiency she got a recent infusion hemoglobin came up she's doing very well with that  She still smokes she is not going to quit she does try to limit  COPD stable she is not interested in doing any pulmonary function or advanced testing.

## 2014-04-25 ENCOUNTER — Telehealth: Payer: Self-pay | Admitting: Family Medicine

## 2014-04-25 NOTE — Telephone Encounter (Signed)
Appt made

## 2014-04-25 NOTE — Telephone Encounter (Signed)
She be given today at 4 PM or September 1. Thank you

## 2014-04-25 NOTE — Telephone Encounter (Signed)
Patient tried to call this morning and get a follow up appointment with Dr. Nicki Reaper, but did not have opening until 05/03/14. She called back and said that she was having frequent urination for about a week and it was uncomfortable.  Wants to know if she can be worked in 04/26/14 or 04/27/14.

## 2014-04-26 ENCOUNTER — Encounter: Payer: Self-pay | Admitting: Family Medicine

## 2014-04-26 ENCOUNTER — Ambulatory Visit (INDEPENDENT_AMBULATORY_CARE_PROVIDER_SITE_OTHER): Payer: Medicare Other | Admitting: Family Medicine

## 2014-04-26 VITALS — BP 130/74 | Temp 98.8°F | Ht <= 58 in | Wt 97.2 lb

## 2014-04-26 DIAGNOSIS — J438 Other emphysema: Secondary | ICD-10-CM | POA: Diagnosis not present

## 2014-04-26 MED ORDER — CITALOPRAM HYDROBROMIDE 10 MG PO TABS
10.0000 mg | ORAL_TABLET | Freq: Every day | ORAL | Status: DC
Start: 2014-04-26 — End: 2014-05-27

## 2014-04-26 MED ORDER — ALPRAZOLAM 0.25 MG PO TABS: 0.2500 mg | ORAL_TABLET | Freq: Three times a day (TID) | ORAL | Status: DC | PRN

## 2014-04-26 NOTE — Progress Notes (Signed)
   Subjective:    Patient ID: Carol Massey, female    DOB: March 14, 1926, 80 y.o.   MRN: [id number]  Anxiety Presents for initial visit. Onset was 1 to 4 weeks ago. The problem has been unchanged. Symptoms include nausea. Primary symptoms comment: diarrhea. Symptoms occur occasionally. The severity of symptoms is causing significant distress. Nothing aggravates the symptoms.   There are no known risk factors. Past treatments include nothing. The treatment provided no relief.   Patient states she has no other concerns at this time.    Review of Systems  Gastrointestinal: Positive for nausea.   She relates poor appetite fatigue tiredness nervousness.    Objective:   Physical Exam Lungs clear heart regular pulse normal extremities no edema weight       Assessment & Plan:  20 minutes spent with the patient and she is having anxiety episodes poor appetite nervousness anxiousness all related to her son having malignant skin cancer as well as her husband being treated for lung cancer although he is doing better I discussed how her physical symptoms are related to what is going on like this year back in a few weeks Celexa 10 mg daily half tablet daily for the first week then one tablet daily if any side effects or problems notify us also Xanax sparingly as needed for nervousness or anxiousness. She does have a history of COPD but no flareup per

## 2014-05-03 ENCOUNTER — Ambulatory Visit: Payer: Medicare Other | Admitting: Family Medicine

## 2014-05-19 ENCOUNTER — Ambulatory Visit: Payer: Medicare Other | Admitting: Family Medicine

## 2014-05-25 ENCOUNTER — Other Ambulatory Visit (HOSPITAL_COMMUNITY): Payer: Self-pay

## 2014-05-25 ENCOUNTER — Ambulatory Visit (INDEPENDENT_AMBULATORY_CARE_PROVIDER_SITE_OTHER): Payer: Medicare Other | Admitting: Family Medicine

## 2014-05-25 ENCOUNTER — Encounter: Payer: Self-pay | Admitting: Family Medicine

## 2014-05-25 VITALS — BP 110/72 | Ht <= 58 in | Wt 97.2 lb

## 2014-05-25 DIAGNOSIS — J449 Chronic obstructive pulmonary disease, unspecified: Secondary | ICD-10-CM

## 2014-05-25 DIAGNOSIS — Z23 Encounter for immunization: Secondary | ICD-10-CM | POA: Diagnosis not present

## 2014-05-25 DIAGNOSIS — J4489 Other specified chronic obstructive pulmonary disease: Secondary | ICD-10-CM

## 2014-05-25 DIAGNOSIS — D509 Iron deficiency anemia, unspecified: Secondary | ICD-10-CM

## 2014-05-25 DIAGNOSIS — M810 Age-related osteoporosis without current pathological fracture: Secondary | ICD-10-CM

## 2014-05-25 NOTE — Progress Notes (Signed)
   Subjective:    Patient ID: Carol Massey, female    DOB: 1926/06/04, 80 y.o.   MRN: [id number]  HPI  Patient arrives for follow up on depression and anxiety. Patient with history of COPD Review of Systems  Constitutional: Negative for activity change, appetite change and fatigue.  Respiratory: Negative for cough, shortness of breath and stridor.   Cardiovascular: Negative for chest pain.  Gastrointestinal: Negative for abdominal pain.  Endocrine: Negative for polydipsia and polyphagia.  Genitourinary: Negative for frequency.  Neurological: Negative for weakness.  Psychiatric/Behavioral: Negative for confusion.       Objective:   Physical Exam  Vitals reviewed. Constitutional: She appears well-nourished. No distress.  Cardiovascular: Normal rate, regular rhythm and normal heart sounds.   No murmur heard. Pulmonary/Chest: Effort normal and breath sounds normal. No respiratory distress.  Musculoskeletal: She exhibits no edema.  Lymphadenopathy:    She has no cervical adenopathy.  Neurological: She is alert. She exhibits normal muscle tone.  Psychiatric: Her behavior is normal.          Assessment & Plan:  COPD stable continue current measures Counseled patient regarding proper nutrition to keep her weight up Counseled the patient regarding bone density patient defers currently her son is undergoing cancer treatment Flu shot today. Counseled to quit smoking she does not want to

## 2014-05-27 ENCOUNTER — Encounter (HOSPITAL_COMMUNITY): Payer: Medicare Other | Attending: Hematology and Oncology

## 2014-05-27 ENCOUNTER — Encounter (HOSPITAL_COMMUNITY): Payer: Self-pay

## 2014-05-27 ENCOUNTER — Encounter (HOSPITAL_BASED_OUTPATIENT_CLINIC_OR_DEPARTMENT_OTHER): Payer: Medicare Other

## 2014-05-27 ENCOUNTER — Other Ambulatory Visit: Payer: Self-pay | Admitting: *Deleted

## 2014-05-27 ENCOUNTER — Encounter (HOSPITAL_COMMUNITY): Payer: Self-pay | Admitting: Oncology

## 2014-05-27 ENCOUNTER — Other Ambulatory Visit (HOSPITAL_COMMUNITY): Payer: Self-pay | Admitting: Oncology

## 2014-05-27 VITALS — BP 175/60 | HR 59 | Temp 98.6°F | Resp 18 | Wt 96.4 lb

## 2014-05-27 DIAGNOSIS — D500 Iron deficiency anemia secondary to blood loss (chronic): Secondary | ICD-10-CM | POA: Diagnosis not present

## 2014-05-27 DIAGNOSIS — K5521 Angiodysplasia of colon with hemorrhage: Secondary | ICD-10-CM | POA: Diagnosis not present

## 2014-05-27 DIAGNOSIS — D649 Anemia, unspecified: Secondary | ICD-10-CM | POA: Diagnosis not present

## 2014-05-27 DIAGNOSIS — D509 Iron deficiency anemia, unspecified: Secondary | ICD-10-CM | POA: Insufficient documentation

## 2014-05-27 HISTORY — DX: Iron deficiency anemia secondary to blood loss (chronic): D50.0

## 2014-05-27 LAB — CBC WITH DIFFERENTIAL/PLATELET
Basophils Absolute: 0.1 10*3/uL (ref 0.0–0.1)
Basophils Relative: 1 % (ref 0–1)
Eosinophils Absolute: 0.1 10*3/uL (ref 0.0–0.7)
Eosinophils Relative: 2 % (ref 0–5)
HEMATOCRIT: 33.4 % — AB (ref 36.0–46.0)
HEMOGLOBIN: 10.4 g/dL — AB (ref 12.0–15.0)
LYMPHS PCT: 26 % (ref 12–46)
Lymphs Abs: 1.2 10*3/uL (ref 0.7–4.0)
MCH: 25.6 pg — ABNORMAL LOW (ref 26.0–34.0)
MCHC: 31.1 g/dL (ref 30.0–36.0)
MCV: 82.3 fL (ref 78.0–100.0)
MONO ABS: 0.4 10*3/uL (ref 0.1–1.0)
Monocytes Relative: 8 % (ref 3–12)
NEUTROS ABS: 2.9 10*3/uL (ref 1.7–7.7)
Neutrophils Relative %: 63 % (ref 43–77)
Platelets: 249 10*3/uL (ref 150–400)
RBC: 4.06 MIL/uL (ref 3.87–5.11)
RDW: 15.5 % (ref 11.5–15.5)
WBC: 4.7 10*3/uL (ref 4.0–10.5)

## 2014-05-27 LAB — FERRITIN: Ferritin: 13 ng/mL (ref 10–291)

## 2014-05-27 MED ORDER — CITALOPRAM HYDROBROMIDE 10 MG PO TABS: 10.0000 mg | ORAL_TABLET | Freq: Every day | ORAL | Status: DC

## 2014-05-27 NOTE — Progress Notes (Signed)
[facility] OFFICE PROGRESS NOTE  PCP Sallee Lange, MD [address] [place] [address]  DIAGNOSIS: Anemia, unspecified anemia type - Plan: CBC, Ferritin  CURRENT THERAPY:  Monitoring Blood counts and periodic intravenous Feraheme.  HEMATOLOGY/ONCOLOGY HX: Carol Massey is an 80 y.o. Married female who returns for routine followup of iron deficiency secondary to chronic blood loss from AV malformations of the gastrointestinal tract. On her prior EGD gastric lesions were seen although the patient denies acute bleeding, melena, hematochezia, or abdominal pain. Appetite is good without nausea, vomitting, or change in bowel habits. The Hgb.was 10.8 in July. Currently without complaints of dizziness, lightheadedness, dyspnea, chest pain, or palpatations. Carol Massey did receive her last iron infusion in April, 2015.          MEDICAL HISTORY:  Past Medical History  Diagnosis Date  . Stroke   . Hypertension   . Osteoporosis   . COPD (chronic obstructive pulmonary disease)   . Anemia   . Hard of hearing   . Iron deficiency anemia due to chronic blood loss 05/27/2014    has COPD (chronic obstructive pulmonary disease); Osteoporosis, unspecified; Hyperlipemia; Microcytic anemia; and Iron deficiency anemia due to chronic blood loss on her problem list.    ALLERGIES:  has No Known Allergies.  MEDICATIONS: has a current medication list which includes the following prescription(s): albuterol, alprazolam, aspirin, calcium carb-cholecalciferol, iron, UNABLE TO FIND, citalopram, and OVER THE COUNTER MEDICATION.  FAMILY HISTORY: family history includes Hypertension in her mother; Leukemia (age of onset: 79) in her father; Stroke in her mother.  REVIEW OF SYSTEMS:    SINCE YOUR LAST VISIT Been diagnosed or treated for a new medical /surgical  problem or condition: No Any Recent Xrays or studies performed: Yes Any new prescription or OTC medications:  No ECOG Perf Status: Restricted in physically strenuous activity but ambulatory and able to carry out work of a light or sedentary nature, e.g., light house work, office work Problems sleeping: No Medications taken to help sleep: Yes How is your appetie: 100% normal Any Supplements: No Any trouble chewing or swallowing: No Any Nausea or Vomiting: No Any Bowel problems: No # Bowel Movements per week: 7 Any Urinary Issues: No Any Cardiac Problems: No Any Respiratory Issues: No Any Neurological Issues: No Do you live alone: No Feelings hopelessness: No You or your family have any concerns or Health changes: No Pain Assessment Pain Score: 0-No pain  She is hard of hearing. Other than that discussed above is noncontributory.    PHYSICAL EXAMINATION:   weight is 96 lb 6.4 oz (43.727 kg). Her oral temperature is 98.6 F (37 C). Her blood pressure is 175/60 and her pulse is 59. Her respiration is 18 and oxygen saturation is 100%.    GENERAL: alert, no distress and comfortable SKIN: color,, texture, turgor are normal, no rashes or significant lesions. No cyanosis of digits and no clubbing.  EYES: PERLA; Conjunctiva are pale and non-injected, sclera clear OROPHARYNX: no exudate, no erythema on lips, buccal mucosa, or tongue. NECK: non-tender, without nodularity. No masses LYMPH:  no palpable lymphadenopathy in the cervical, axillary or inguinal LUNGS: clear to auscultation and percussion with normal breathing effort.  HEART: regular rate & rhythm and no murmurs.  ABDOMEN: abdomen soft, non-tender and normal bowel sounds, without masses MUSCULOSKELETAL/EXTREMITIES: No tenderness  NEURO: alert & oriented x 3 with fluent speech, no focal motor/sensory deficits except for hearing loss.   LABORATORY DATA: Lab  on 05/27/2014  Component Date Value Ref Range Status  . WBC 05/27/2014 4.7  4.0 - 10.5 K/uL Final  . RBC 05/27/2014 4.06  3.87 - 5.11 MIL/uL Final  . Hemoglobin 05/27/2014 10.4*  12.0 - 15.0 g/dL Final  . HCT 05/27/2014 33.4* 36.0 - 46.0 % Final  . MCV 05/27/2014 82.3  78.0 - 100.0 fL Final  . MCH 05/27/2014 25.6* 26.0 - 34.0 pg Final  . MCHC 05/27/2014 31.1  30.0 - 36.0 g/dL Final  . RDW 05/27/2014 15.5  11.5 - 15.5 % Final  . Platelets 05/27/2014 249  150 - 400 K/uL Final  . Neutrophils Relative % 05/27/2014 63  43 - 77 % Final  . Neutro Abs 05/27/2014 2.9  1.7 - 7.7 K/uL Final  . Lymphocytes Relative 05/27/2014 26  12 - 46 % Final  . Lymphs Abs 05/27/2014 1.2  0.7 - 4.0 K/uL Final  . Monocytes Relative 05/27/2014 8  3 - 12 % Final  . Monocytes Absolute 05/27/2014 0.4  0.1 - 1.0 K/uL Final  . Eosinophils Relative 05/27/2014 2  0 - 5 % Final  . Eosinophils Absolute 05/27/2014 0.1  0.0 - 0.7 K/uL Final  . Basophils Relative 05/27/2014 1  0 - 1 % Final  . Basophils Absolute 05/27/2014 0.1  0.0 - 0.1 K/uL Final   Ferritin level 17 (02/23/14). Repeat level is pending      Procedures: Colonoscopy performed 08/12/13 showed diverticulosis. EGD performed 08/12/14 and Gastric AVM with recent bleeding. Noted. Thermal sealing and hemostasis clipping performed by Dr. Gala Romney.   ASSESSMENT:   1. Iron deficiency anemia secondary to intermittent gastrointestinal bleeding from AVMs. Treatment has been supportive with Carol Massey.     RECOMMENDATIONS:   In light of the patient's microcytic anemia and decreased ferritin, she will be scheduled for outpatient treatments on 2 consecutive weeks.     All questions were answered. Time spent during the visit was 15 minutes of which 50% of the time consisted of counseling the patient.     Darrall Dears, MD 05/27/2014 3:11 PM

## 2014-05-27 NOTE — Patient Instructions (Signed)
[facility] Discharge Instructions  RECOMMENDATIONS MADE BY THE CONSULTANT AND ANY TEST RESULTS WILL BE SENT TO YOUR REFERRING PHYSICIAN.  EXAM FINDINGS BY THE PHYSICIAN TODAY AND SIGNS OR SYMPTOMS TO REPORT TO CLINIC OR PRIMARY PHYSICIAN: Exam and findings as discussed by Dr. Bubba Hales. Your iron level was low in July and we will give you iron infusions - see schedule.   Report increased fatigue or other concerns.    INSTRUCTIONS/FOLLOW-UP: See schedule.  Thank you for choosing [facility] to provide your oncology and hematology care.  To afford each patient quality time with our providers, please arrive at least 15 minutes before your scheduled appointment time.  With your help, our goal is to use those 15 minutes to complete the necessary work-up to ensure our physicians have the information they need to help with your evaluation and healthcare recommendations.    Effective January 1st, 2014, we ask that you re-schedule your appointment with our physicians should you arrive 10 or more minutes late for your appointment.  We strive to give you quality time with our providers, and arriving late affects you and other patients whose appointments are after yours.    Again, thank you for choosing [facility].  Our hope is that these requests will decrease the amount of time that you wait before being seen by our physicians.       _____________________________________________________________  Should you have questions after your visit to [facility], please contact our office at [phone] between the hours of 8:30 a.m. and 4:30 p.m.  Voicemails left after 4:30 p.m. will not be returned until the following business day.  For prescription refill requests, have your pharmacy contact our office with your prescription refill request.    _______________________________________________________________  We hope that we have given you very good  care.  You may receive a patient satisfaction survey in the mail, please complete it and return it as soon as possible.  We value your feedback!  _______________________________________________________________  Have you asked about our STAR program?  STAR stands for Survivorship Training and Rehabilitation, and this is a nationally recognized cancer care program that focuses on survivorship and rehabilitation.  Cancer and cancer treatments may cause problems, such as, pain, making you feel tired and keeping you from doing the things that you need or want to do. Cancer rehabilitation can help. Our goal is to reduce these troubling effects and help you have the best quality of life possible.  You may receive a survey from a nurse that asks questions about your current state of health.  Based on the survey results, all eligible patients will be referred to the [facility] program for an evaluation so we can better serve you!  A frequently asked questions sheet is available upon request.

## 2014-05-27 NOTE — Progress Notes (Signed)
LABS FOR CBCD,FERR  

## 2014-05-31 ENCOUNTER — Encounter (HOSPITAL_BASED_OUTPATIENT_CLINIC_OR_DEPARTMENT_OTHER): Payer: Medicare Other

## 2014-05-31 VITALS — BP 162/55 | HR 55 | Temp 98.4°F | Resp 18

## 2014-05-31 DIAGNOSIS — K5521 Angiodysplasia of colon with hemorrhage: Secondary | ICD-10-CM | POA: Diagnosis not present

## 2014-05-31 DIAGNOSIS — D500 Iron deficiency anemia secondary to blood loss (chronic): Secondary | ICD-10-CM

## 2014-05-31 DIAGNOSIS — D509 Iron deficiency anemia, unspecified: Secondary | ICD-10-CM

## 2014-05-31 MED ORDER — FERUMOXYTOL INJECTION 510 MG/17 ML
510.0000 mg | Freq: Once | INTRAVENOUS | Status: AC
  Administered 2014-05-31: 510 mg via INTRAVENOUS
  Filled 2014-05-31: qty 17

## 2014-05-31 MED ORDER — SODIUM CHLORIDE 0.9 % IJ SOLN: 10.0000 mL | INTRAMUSCULAR | Status: DC | PRN

## 2014-05-31 MED ORDER — SODIUM CHLORIDE 0.9 % IV SOLN
Freq: Once | INTRAVENOUS | Status: AC
  Administered 2014-05-31: 10 mL/h via INTRAVENOUS

## 2014-05-31 NOTE — Progress Notes (Signed)
Carol Massey tolerated IV iron infusion well

## 2014-05-31 NOTE — Patient Instructions (Signed)
[facility] [facility] Discharge Instructions  RECOMMENDATIONS MADE BY THE CONSULTANT AND ANY TEST RESULTS WILL BE SENT TO YOUR REFERRING PHYSICIAN. You had an iron transfusion today.  Please call the clinic with any questions or concerns  Thank you for choosing [facility] to provide your oncology and hematology care.  To afford each patient quality time with our providers, please arrive at least 15 minutes before your scheduled appointment time.  With your help, our goal is to use those 15 minutes to complete the necessary work-up to ensure our physicians have the information they need to help with your evaluation and healthcare recommendations.    Effective January 1st, 2014, we ask that you re-schedule your appointment with our physicians should you arrive 10 or more minutes late for your appointment.  We strive to give you quality time with our providers, and arriving late affects you and other patients whose appointments are after yours.    Again, thank you for choosing [facility].  Our hope is that these requests will decrease the amount of time that you wait before being seen by our physicians.       _____________________________________________________________  Should you have questions after your visit to [facility], please contact our office at [phone] between the hours of 8:30 a.m. and 5:00 p.m.  Voicemails left after 4:30 p.m. will not be returned until the following business day.  For prescription refill requests, have your pharmacy contact our office with your prescription refill request.

## 2014-06-07 ENCOUNTER — Encounter (HOSPITAL_BASED_OUTPATIENT_CLINIC_OR_DEPARTMENT_OTHER): Payer: Medicare Other

## 2014-06-07 VITALS — BP 128/51 | HR 64 | Temp 98.3°F | Resp 18

## 2014-06-07 DIAGNOSIS — D509 Iron deficiency anemia, unspecified: Secondary | ICD-10-CM | POA: Diagnosis not present

## 2014-06-07 MED ORDER — SODIUM CHLORIDE 0.9 % IV SOLN
510.0000 mg | Freq: Once | INTRAVENOUS | Status: AC
  Administered 2014-06-07: 510 mg via INTRAVENOUS
  Filled 2014-06-07: qty 17

## 2014-06-07 MED ORDER — SODIUM CHLORIDE 0.9 % IV SOLN
INTRAVENOUS | Status: DC
  Administered 2014-06-07: 12:00:00 via INTRAVENOUS

## 2014-06-07 NOTE — Progress Notes (Signed)
Patient tolerated infusion well.

## 2014-06-13 DIAGNOSIS — D225 Melanocytic nevi of trunk: Secondary | ICD-10-CM | POA: Diagnosis not present

## 2014-06-13 DIAGNOSIS — C44319 Basal cell carcinoma of skin of other parts of face: Secondary | ICD-10-CM | POA: Diagnosis not present

## 2014-06-13 DIAGNOSIS — L570 Actinic keratosis: Secondary | ICD-10-CM | POA: Diagnosis not present

## 2014-06-13 DIAGNOSIS — L820 Inflamed seborrheic keratosis: Secondary | ICD-10-CM | POA: Diagnosis not present

## 2014-06-13 DIAGNOSIS — D0439 Carcinoma in situ of skin of other parts of face: Secondary | ICD-10-CM | POA: Diagnosis not present

## 2014-06-13 DIAGNOSIS — X32XXXA Exposure to sunlight, initial encounter: Secondary | ICD-10-CM | POA: Diagnosis not present

## 2014-06-13 DIAGNOSIS — C44310 Basal cell carcinoma of skin of unspecified parts of face: Secondary | ICD-10-CM | POA: Diagnosis not present

## 2014-07-05 ENCOUNTER — Encounter (HOSPITAL_COMMUNITY): Payer: Medicare Other | Attending: Hematology and Oncology

## 2014-07-05 DIAGNOSIS — D649 Anemia, unspecified: Secondary | ICD-10-CM | POA: Diagnosis not present

## 2014-07-05 DIAGNOSIS — D509 Iron deficiency anemia, unspecified: Secondary | ICD-10-CM | POA: Diagnosis not present

## 2014-07-05 LAB — CBC
HCT: 37.7 % (ref 36.0–46.0)
HEMOGLOBIN: 12.2 g/dL (ref 12.0–15.0)
MCH: 29.3 pg (ref 26.0–34.0)
MCHC: 32.4 g/dL (ref 30.0–36.0)
MCV: 90.6 fL (ref 78.0–100.0)
Platelets: 218 10*3/uL (ref 150–400)
RBC: 4.16 MIL/uL (ref 3.87–5.11)
RDW: 20.9 % — ABNORMAL HIGH (ref 11.5–15.5)
WBC: 5.7 10*3/uL (ref 4.0–10.5)

## 2014-07-05 LAB — FERRITIN: FERRITIN: 221 ng/mL (ref 10–291)

## 2014-07-05 NOTE — Progress Notes (Signed)
Labs for ferr,cbc

## 2014-07-19 IMAGING — CR DG RIBS 2V*L*
3 series · 3 of 3 positions shown · non-contrast
Comparison: 03/15/2013

CLINICAL DATA: Left rib pain.

EXAM:
LEFT RIBS - 2 VIEW

[view not recorded (1 of 3)]
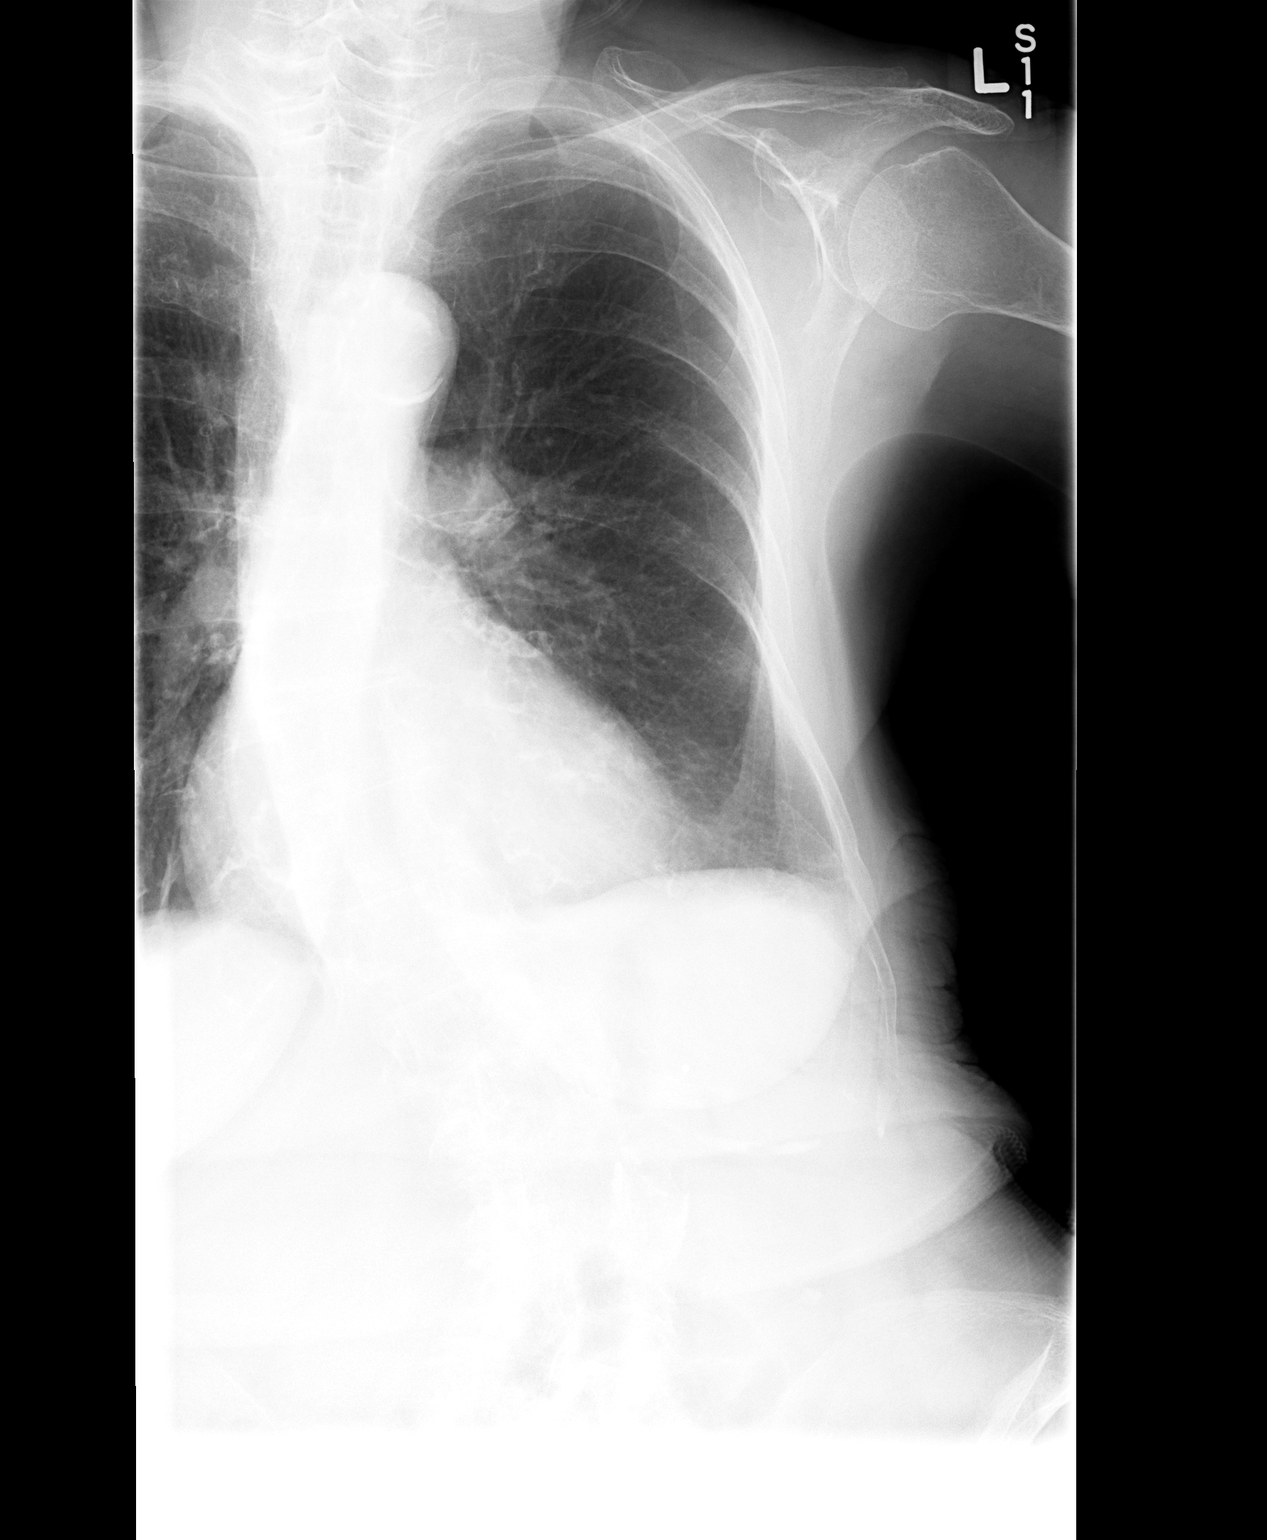

[view not recorded (2 of 3)]
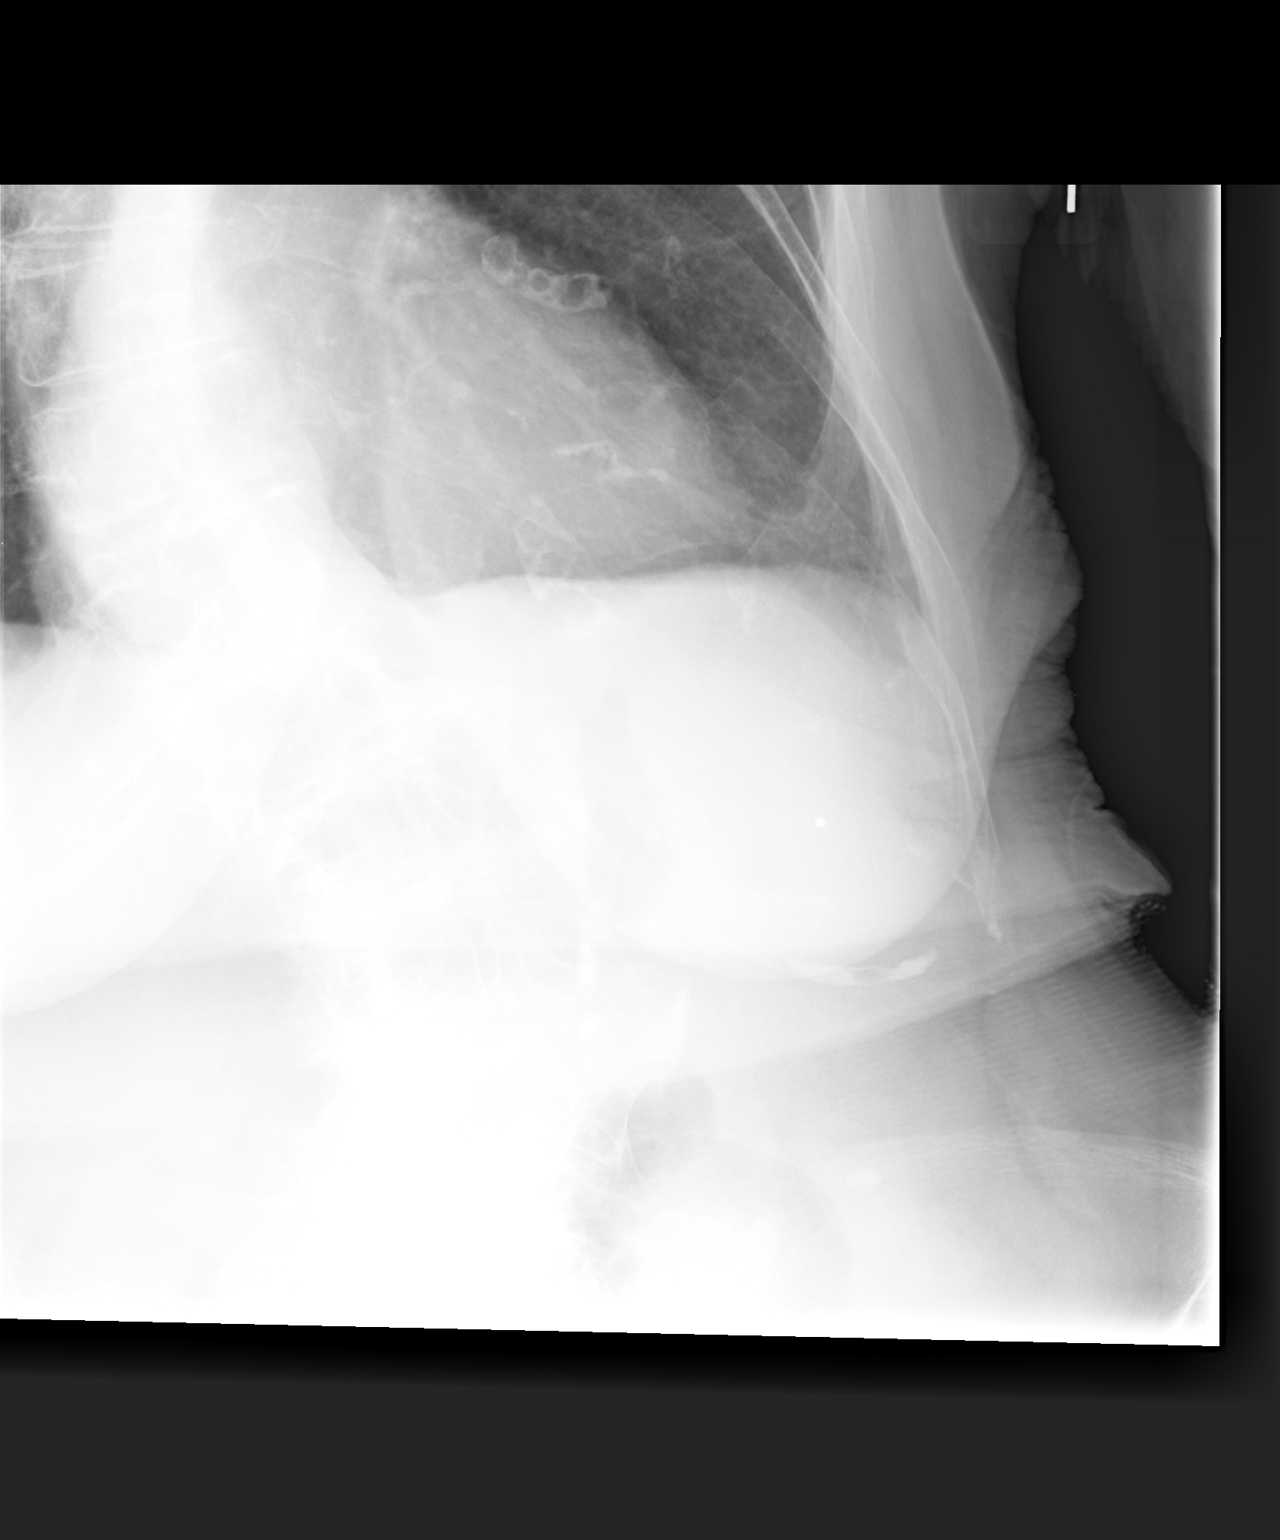

[view not recorded (3 of 3)]
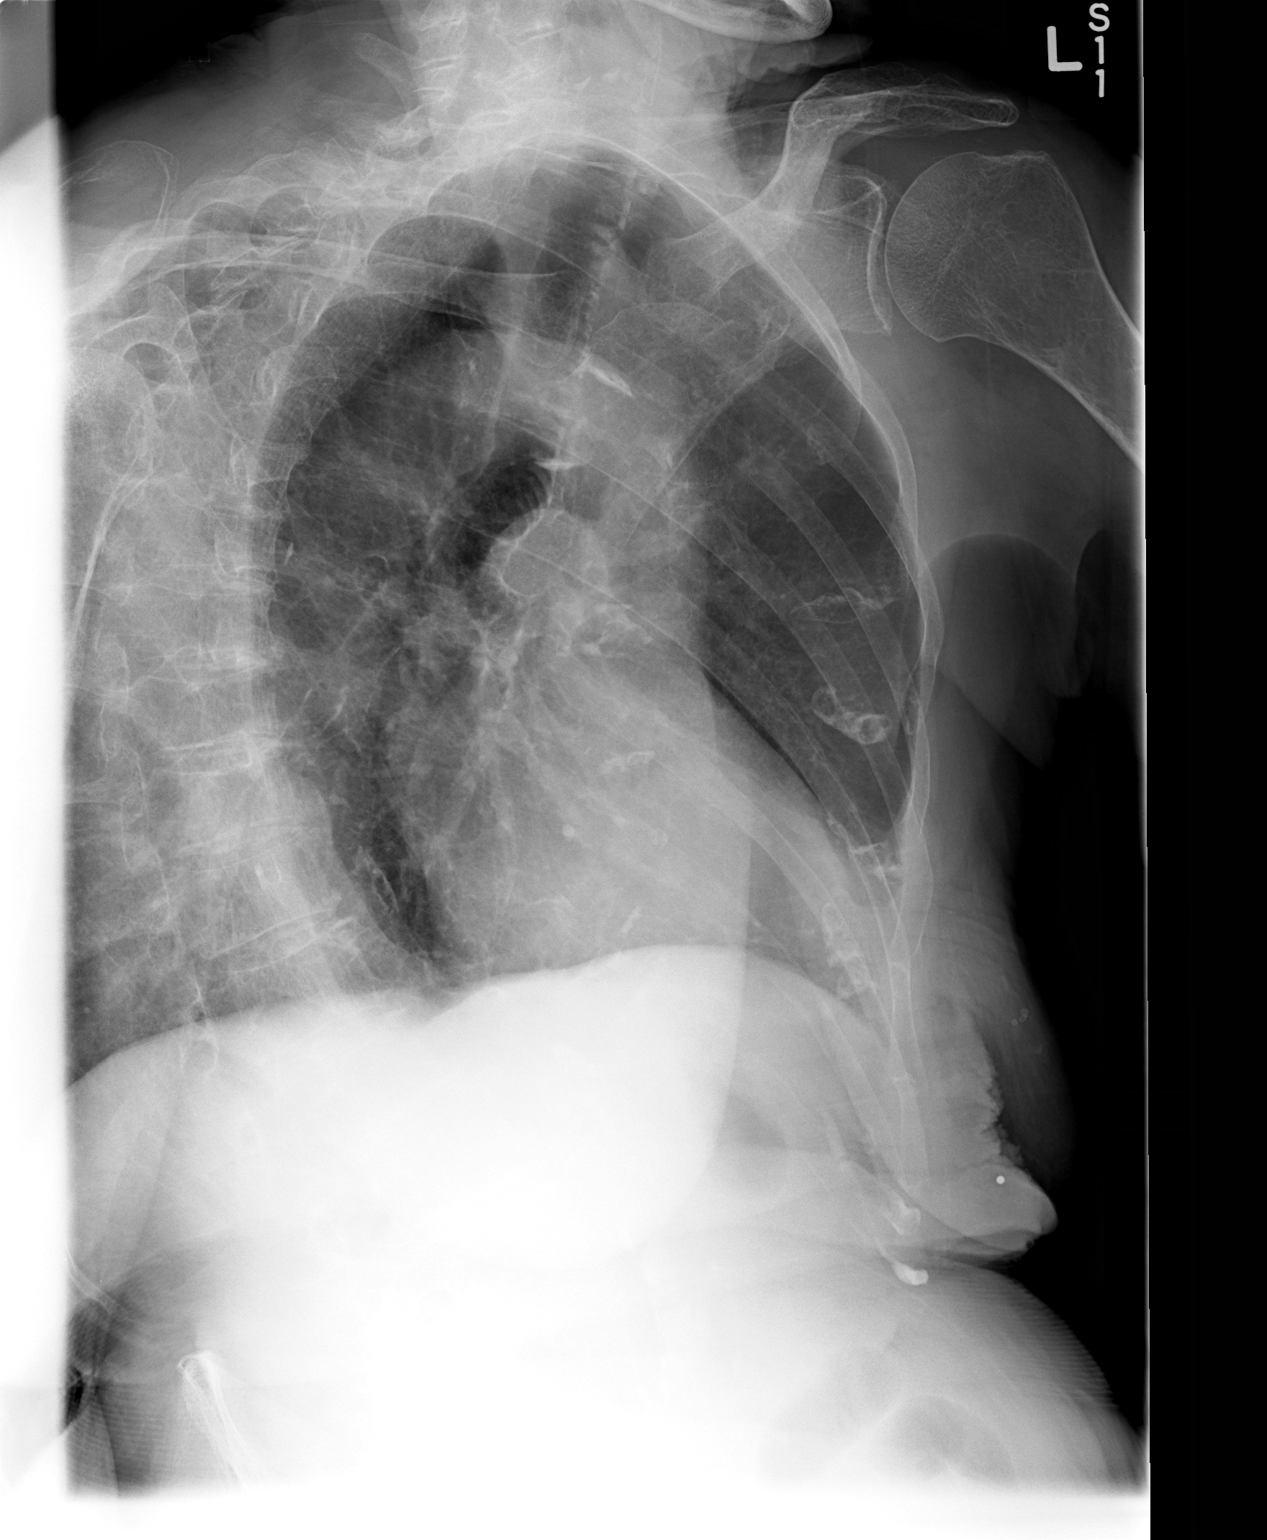

[3 of 3 positions shown; findings below may reference images not displayed]

FINDINGS: No visible rib fracture. No pneumothorax. Left lung is clear. No
effusion.
IMPRESSION: Negative.

## 2014-07-20 DIAGNOSIS — L820 Inflamed seborrheic keratosis: Secondary | ICD-10-CM | POA: Diagnosis not present

## 2014-07-20 DIAGNOSIS — Z08 Encounter for follow-up examination after completed treatment for malignant neoplasm: Secondary | ICD-10-CM | POA: Diagnosis not present

## 2014-07-20 DIAGNOSIS — Z85828 Personal history of other malignant neoplasm of skin: Secondary | ICD-10-CM | POA: Diagnosis not present

## 2014-08-24 ENCOUNTER — Encounter: Payer: Self-pay | Admitting: Family Medicine

## 2014-08-24 ENCOUNTER — Ambulatory Visit (INDEPENDENT_AMBULATORY_CARE_PROVIDER_SITE_OTHER): Payer: Medicare Other | Admitting: Family Medicine

## 2014-08-24 VITALS — BP 102/60 | Ht 60.0 in | Wt 95.8 lb

## 2014-08-24 DIAGNOSIS — J431 Panlobular emphysema: Secondary | ICD-10-CM | POA: Diagnosis not present

## 2014-08-24 MED ORDER — CITALOPRAM HYDROBROMIDE 10 MG PO TABS: 10.0000 mg | ORAL_TABLET | Freq: Every day | ORAL | Status: DC

## 2014-08-24 NOTE — Progress Notes (Signed)
   Subjective:    Patient ID: Carol Massey, female    DOB: [birth year], 80 y.o.   MRN: [id number]  HPIFollow up on depression and anxiety. Pt taking celexa and xanax. Requesting a 90 day supply. Pt states she is feeling better. No concerns today. Patient states she's feeling better. She states her breathing is hanging in there. 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

## 2014-09-02 ENCOUNTER — Ambulatory Visit (HOSPITAL_COMMUNITY): Payer: Medicare Other | Admitting: Oncology

## 2014-09-02 ENCOUNTER — Other Ambulatory Visit (HOSPITAL_COMMUNITY): Payer: Medicare Other

## 2014-11-22 ENCOUNTER — Ambulatory Visit: Payer: Medicare Other | Admitting: Family Medicine

## 2014-12-02 ENCOUNTER — Encounter: Payer: Self-pay | Admitting: Family Medicine

## 2014-12-02 ENCOUNTER — Ambulatory Visit (INDEPENDENT_AMBULATORY_CARE_PROVIDER_SITE_OTHER): Payer: Medicare Other | Admitting: Family Medicine

## 2014-12-02 VITALS — BP 126/72 | Ht 60.0 in | Wt 93.0 lb

## 2014-12-02 DIAGNOSIS — D509 Iron deficiency anemia, unspecified: Secondary | ICD-10-CM | POA: Diagnosis not present

## 2014-12-02 DIAGNOSIS — E785 Hyperlipidemia, unspecified: Secondary | ICD-10-CM | POA: Diagnosis not present

## 2014-12-02 DIAGNOSIS — M8580 Other specified disorders of bone density and structure, unspecified site: Secondary | ICD-10-CM

## 2014-12-02 DIAGNOSIS — J438 Other emphysema: Secondary | ICD-10-CM | POA: Diagnosis not present

## 2014-12-02 MED ORDER — ALPRAZOLAM 0.25 MG PO TABS: 0.2500 mg | ORAL_TABLET | Freq: Two times a day (BID) | ORAL | Status: DC | PRN

## 2014-12-02 MED ORDER — CITALOPRAM HYDROBROMIDE 20 MG PO TABS: 20.0000 mg | ORAL_TABLET | Freq: Every day | ORAL | Status: DC

## 2014-12-02 NOTE — Progress Notes (Signed)
   Subjective:    Patient ID: Carol Massey, female    DOB: 27-Nov-1925, 81 y.o.   MRN: [id number]  HPI Patient is here today for a 3 month check up.  She needs a refill on her Celexa and Xanax. Patient get stressed out at times about what goes on with her son he's been treated for cancer she finds himself doing little bit better but she does worry about the future   She does smoke she coughs she denies hemoptysis she denies increase in shortness of breath states that the phlegm mainly is clear mucus. She know she needs to quit smoking but she is just not motivated to do so.  Pt took Imodium AD OTC for diarrhea over the weekend. It has cleared up. She denies rectal bleeding.  No concerns.   we talked at length about osteopenia the risk of osteoporosis the importance of doing a bone scan she understands this and consents to do so   Greater than 25 minutes was spent covering all these areas issues  Review of Systems  Constitutional: Negative for activity change, appetite change and fatigue.  HENT: Negative for congestion.   Respiratory: Negative for cough.   Cardiovascular: Negative for chest pain.  Gastrointestinal: Negative for abdominal pain.  Endocrine: Negative for polydipsia and polyphagia.  Neurological: Negative for weakness.  Psychiatric/Behavioral: Negative for confusion.       Objective:   Physical Exam  Constitutional: She appears well-nourished. No distress.  Cardiovascular: Normal rate, regular rhythm and normal heart sounds.   No murmur heard. Pulmonary/Chest: Effort normal and breath sounds normal. No respiratory distress.  Musculoskeletal: She exhibits no edema.  Lymphadenopathy:    She has no cervical adenopathy.  Neurological: She is alert. She exhibits normal muscle tone.  Psychiatric: Her behavior is normal.  Vitals reviewed.         Assessment & Plan:  Depression-increase Celexa to 20 mg I believe this will help her greater may use Xanax on a when  necessary basis  COPD stable I encouraged patient to quit smoking she would do her supple world good if she did it's unlikely that she will quit smoking though.  Osteopenia check bone density profile she is not minimal favor of treatment in the past but we'll see what this shows  Hyperlipidemia check lipid profile. Patient is not minimal favor treatment in the past  Follow-up 4 months to 5 months  Greater than 25 minutes spent with patient discussing multiple different issues none in 214

## 2014-12-02 NOTE — Patient Instructions (Signed)
Smoking Cessation, Tips for Success  If you are ready to quit smoking, congratulations! You have chosen to help yourself be healthier. Cigarettes bring nicotine, tar, carbon monoxide, and other irritants into your body. Your lungs, heart, and blood vessels will be able to work better without these poisons. There are many different ways to quit smoking. Nicotine gum, nicotine patches, a nicotine inhaler, or nicotine nasal spray can help with physical craving. Hypnosis, support groups, and medicines help break the habit of smoking.  WHAT THINGS CAN I DO TO MAKE QUITTING EASIER?   Here are some tips to help you quit for good:  · Pick a date when you will quit smoking completely. Tell all of your friends and family about your plan to quit on that date.  · Do not try to slowly cut down on the number of cigarettes you are smoking. Pick a quit date and quit smoking completely starting on that day.  · Throw away all cigarettes.    · Clean and remove all ashtrays from your home, work, and car.  · On a card, write down your reasons for quitting. Carry the card with you and read it when you get the urge to smoke.  · Cleanse your body of nicotine. Drink enough water and fluids to keep your urine clear or pale yellow. Do this after quitting to flush the nicotine from your body.  · Learn to predict your moods. Do not let a bad situation be your excuse to have a cigarette. Some situations in your life might tempt you into wanting a cigarette.  · Never have "just one" cigarette. It leads to wanting another and another. Remind yourself of your decision to quit.  · Change habits associated with smoking. If you smoked while driving or when feeling stressed, try other activities to replace smoking. Stand up when drinking your coffee. Brush your teeth after eating. Sit in a different chair when you read the paper. Avoid alcohol while trying to quit, and try to drink fewer caffeinated beverages. Alcohol and caffeine may urge you to  smoke.  · Avoid foods and drinks that can trigger a desire to smoke, such as sugary or spicy foods and alcohol.  · Ask people who smoke not to smoke around you.  · Have something planned to do right after eating or having a cup of coffee. For example, plan to take a walk or exercise.  · Try a relaxation exercise to calm you down and decrease your stress. Remember, you may be tense and nervous for the first 2 weeks after you quit, but this will pass.  · Find new activities to keep your hands busy. Play with a pen, coin, or rubber band. Doodle or draw things on paper.  · Brush your teeth right after eating. This will help cut down on the craving for the taste of tobacco after meals. You can also try mouthwash.    · Use oral substitutes in place of cigarettes. Try using lemon drops, carrots, cinnamon sticks, or chewing gum. Keep them handy so they are available when you have the urge to smoke.  · When you have the urge to smoke, try deep breathing.  · Designate your home as a nonsmoking area.  · If you are a heavy smoker, ask your health care provider about a prescription for nicotine chewing gum. It can ease your withdrawal from nicotine.  · Reward yourself. Set aside the cigarette money you save and buy yourself something nice.  · Look for   support from others. Join a support group or smoking cessation program. Ask someone at home or at work to help you with your plan to quit smoking.  · Always ask yourself, "Do I need this cigarette or is this just a reflex?" Tell yourself, "Today, I choose not to smoke," or "I do not want to smoke." You are reminding yourself of your decision to quit.  · Do not replace cigarette smoking with electronic cigarettes (commonly called e-cigarettes). The safety of e-cigarettes is unknown, and some may contain harmful chemicals.  · If you relapse, do not give up! Plan ahead and think about what you will do the next time you get the urge to smoke.  HOW WILL I FEEL WHEN I QUIT SMOKING?  You  may have symptoms of withdrawal because your body is used to nicotine (the addictive substance in cigarettes). You may crave cigarettes, be irritable, feel very hungry, cough often, get headaches, or have difficulty concentrating. The withdrawal symptoms are only temporary. They are strongest when you first quit but will go away within 10-14 days. When withdrawal symptoms occur, stay in control. Think about your reasons for quitting. Remind yourself that these are signs that your body is healing and getting used to being without cigarettes. Remember that withdrawal symptoms are easier to treat than the major diseases that smoking can cause.   Even after the withdrawal is over, expect periodic urges to smoke. However, these cravings are generally short lived and will go away whether you smoke or not. Do not smoke!  WHAT RESOURCES ARE AVAILABLE TO HELP ME QUIT SMOKING?  Your health care provider can direct you to community resources or hospitals for support, which may include:  · Group support.  · Education.  · Hypnosis.  · Therapy.  Document Released: 05/10/2004 Document Revised: 12/27/2013 Document Reviewed: 01/28/2013  ExitCare® Patient Information ©2015 [employer]. This information is not intended to replace advice given to you by your health care provider. Make sure you discuss any questions you have with your health care provider.

## 2014-12-03 DIAGNOSIS — D509 Iron deficiency anemia, unspecified: Secondary | ICD-10-CM | POA: Diagnosis not present

## 2014-12-03 DIAGNOSIS — E785 Hyperlipidemia, unspecified: Secondary | ICD-10-CM | POA: Diagnosis not present

## 2014-12-05 LAB — CBC WITH DIFFERENTIAL/PLATELET
BASOS ABS: 0.1 10*3/uL (ref 0.0–0.2)
BASOS: 3 %
Eos: 3 %
Eosinophils Absolute: 0.1 10*3/uL (ref 0.0–0.4)
HCT: 34.5 % (ref 34.0–46.6)
Hemoglobin: 11 g/dL — ABNORMAL LOW (ref 11.1–15.9)
IMMATURE GRANULOCYTES: 0 %
Immature Grans (Abs): 0 10*3/uL (ref 0.0–0.1)
LYMPHS ABS: 1.1 10*3/uL (ref 0.7–3.1)
Lymphs: 22 %
MCH: 28.7 pg (ref 26.6–33.0)
MCHC: 31.9 g/dL (ref 31.5–35.7)
MCV: 90 fL (ref 79–97)
MONOCYTES: 10 %
Monocytes Absolute: 0.5 10*3/uL (ref 0.1–0.9)
Neutrophils Absolute: 3.3 10*3/uL (ref 1.4–7.0)
Neutrophils Relative %: 62 %
Platelets: 284 10*3/uL (ref 150–379)
RBC: 3.83 x10E6/uL (ref 3.77–5.28)
RDW: 12.8 % (ref 12.3–15.4)
WBC: 5.1 10*3/uL (ref 3.4–10.8)

## 2014-12-05 LAB — LIPID PANEL
CHOLESTEROL TOTAL: 183 mg/dL (ref 100–199)
Chol/HDL Ratio: 2.1 ratio units (ref 0.0–4.4)
HDL: 88 mg/dL (ref >39)
LDL Calculated: 79 mg/dL (ref 0–99)
Triglycerides: 81 mg/dL (ref 0–149)
VLDL Cholesterol Cal: 16 mg/dL (ref 5–40)

## 2014-12-05 LAB — FERRITIN: FERRITIN: 20 ng/mL (ref 15–150)

## 2014-12-06 ENCOUNTER — Encounter: Payer: Self-pay | Admitting: Family Medicine

## 2014-12-08 ENCOUNTER — Ambulatory Visit (HOSPITAL_COMMUNITY)
Admission: RE | Admit: 2014-12-08 | Discharge: 2014-12-08 | Disposition: A | Discharge status: Home / Self Care | Payer: Medicare Other | Source: Ambulatory Visit | Attending: Family Medicine | Admitting: Family Medicine

## 2014-12-08 DIAGNOSIS — M8580 Other specified disorders of bone density and structure, unspecified site: Secondary | ICD-10-CM | POA: Diagnosis not present

## 2014-12-08 DIAGNOSIS — M810 Age-related osteoporosis without current pathological fracture: Secondary | ICD-10-CM | POA: Diagnosis not present

## 2014-12-16 ENCOUNTER — Other Ambulatory Visit: Payer: Self-pay

## 2014-12-16 MED ORDER — ALENDRONATE SODIUM 70 MG PO TABS: 70.0000 mg | ORAL_TABLET | ORAL | Status: DC

## 2015-01-05 ENCOUNTER — Telehealth: Payer: Self-pay | Admitting: Family Medicine

## 2015-01-05 MED ORDER — ALENDRONATE SODIUM 70 MG PO TABS: 70.0000 mg | ORAL_TABLET | ORAL | Status: DC

## 2015-01-05 NOTE — Telephone Encounter (Signed)
Rx sent electronically to pharmacy. Patient notified. 

## 2015-01-05 NOTE — Telephone Encounter (Signed)
Pt is needing a 3 month supply called in for her fosamax so that her Ins. Will pay for it.    [facility] [place]

## 2015-04-24 ENCOUNTER — Other Ambulatory Visit: Payer: Self-pay | Admitting: Family Medicine

## 2015-05-22 ENCOUNTER — Ambulatory Visit (INDEPENDENT_AMBULATORY_CARE_PROVIDER_SITE_OTHER): Payer: Medicare Other | Admitting: Family Medicine

## 2015-05-22 ENCOUNTER — Encounter: Payer: Self-pay | Admitting: Family Medicine

## 2015-05-22 VITALS — BP 126/74 | Ht 60.0 in | Wt 95.1 lb

## 2015-05-22 DIAGNOSIS — N289 Disorder of kidney and ureter, unspecified: Secondary | ICD-10-CM

## 2015-05-22 DIAGNOSIS — J438 Other emphysema: Secondary | ICD-10-CM | POA: Diagnosis not present

## 2015-05-22 DIAGNOSIS — D509 Iron deficiency anemia, unspecified: Secondary | ICD-10-CM

## 2015-05-22 DIAGNOSIS — M810 Age-related osteoporosis without current pathological fracture: Secondary | ICD-10-CM

## 2015-05-22 DIAGNOSIS — Z23 Encounter for immunization: Secondary | ICD-10-CM | POA: Diagnosis not present

## 2015-05-22 DIAGNOSIS — E785 Hyperlipidemia, unspecified: Secondary | ICD-10-CM | POA: Diagnosis not present

## 2015-05-22 NOTE — Progress Notes (Signed)
   Subjective:    Patient ID: Carol Massey, female    DOB: 11/17/1925, 81 y.o.   MRN: [id number]  Hyperlipidemia This is a chronic problem. The current episode started more than 1 year ago. Pertinent negatives include no chest pain. There are no compliance problems.    in the past she's been able to control with diet alone  Patient would has c/o pain and swelling to left side of face. Patient states her moods are doing good Celexa seems to be helping She still smokes she know she needs to quit she uses albuterol when necessary does not want any other medications Patient denies any unilateral numbness or weakness except for the tingling on the lower right part of her body which was felt due to impingement nerve in her back she does take 81 mg aspirin it is felt that the benefits outweigh the risk She does have a history of osteoporosis she takes her medication on a regular basis once a week tolerates it well Patient relates not having any cardiac symptoms.  Patient also has c/o of tingling sensation and weakness to right side of body.  Review of Systems  Constitutional: Negative for activity change, appetite change and fatigue.  HENT: Negative for congestion.   Respiratory: Negative for cough.   Cardiovascular: Negative for chest pain.  Gastrointestinal: Negative for abdominal pain, blood in stool and anal bleeding.  Endocrine: Negative for polydipsia and polyphagia.  Musculoskeletal: Negative for back pain and joint swelling.  Neurological: Negative for dizziness, facial asymmetry, weakness and headaches.  Psychiatric/Behavioral: Negative for confusion.       Objective:   Physical Exam Lungs are clear hearts regular pulse normal extremities no edema blood pressure is good tenderness in the left jaw near the TMJ region no masses are felt inside or outside the mouth       Assessment & Plan:  Tingling on the right side of the leg I feel is due to impingement of the nerve patient  stable COPD stable patient encouraged quit smoking is unlikely she ever well Left TMJ tenderness pain recheck in 2 months no sign of any cancer  1. Osteoporosis She needs to continue her medication. I do not feel this is causing her jaw pain. I doubt that this is osteonecrosis of the jaw. Recheck patient in 4 weeks time. - Basic metabolic panel  2. Other emphysema COPD is stable patient was encouraged quit smoking I doubt she ever will  3. Hyperlipemia Lipid profile checked earlier this year looked good continue dietary measures  4. Microcytic anemia History of microcytic anemia. Has seen gastroenterology. Felt to be malabsorption of iron. Checked lab work - CBC with Differential/Platelet - Ferritin - Basic metabolic panel  5. Renal insufficiency Renal insufficiency check lab work - CBC with Differential/Platelet - Basic metabolic panel  6. Encounter for immunization Flu shot today

## 2015-05-23 LAB — CBC WITH DIFFERENTIAL/PLATELET
Basophils Absolute: 0.1 10*3/uL (ref 0.0–0.2)
Basos: 1 %
EOS (ABSOLUTE): 0.2 10*3/uL (ref 0.0–0.4)
Eos: 3 %
HEMATOCRIT: 33.3 % — AB (ref 34.0–46.6)
HEMOGLOBIN: 10.2 g/dL — AB (ref 11.1–15.9)
IMMATURE GRANS (ABS): 0 10*3/uL (ref 0.0–0.1)
Immature Granulocytes: 0 %
LYMPHS ABS: 1.6 10*3/uL (ref 0.7–3.1)
Lymphs: 29 %
MCH: 24.7 pg — ABNORMAL LOW (ref 26.6–33.0)
MCHC: 30.6 g/dL — ABNORMAL LOW (ref 31.5–35.7)
MCV: 81 fL (ref 79–97)
MONOCYTES: 9 %
Monocytes Absolute: 0.5 10*3/uL (ref 0.1–0.9)
NEUTROS ABS: 3.3 10*3/uL (ref 1.4–7.0)
Neutrophils: 58 %
Platelets: 272 10*3/uL (ref 150–379)
RBC: 4.13 x10E6/uL (ref 3.77–5.28)
RDW: 17 % — ABNORMAL HIGH (ref 12.3–15.4)
WBC: 5.7 10*3/uL (ref 3.4–10.8)

## 2015-05-23 LAB — BASIC METABOLIC PANEL
BUN/Creatinine Ratio: 13 (ref 11–26)
BUN: 10 mg/dL (ref 8–27)
CALCIUM: 10.2 mg/dL (ref 8.7–10.3)
CHLORIDE: 97 mmol/L (ref 97–108)
CO2: 26 mmol/L (ref 18–29)
CREATININE: 0.75 mg/dL (ref 0.57–1.00)
GFR calc non Af Amer: 72 mL/min/{1.73_m2} (ref >59)
GFR, EST AFRICAN AMERICAN: 83 mL/min/{1.73_m2} (ref >59)
Glucose: 89 mg/dL (ref 65–99)
Potassium: 4.2 mmol/L (ref 3.5–5.2)
Sodium: 138 mmol/L (ref 134–144)

## 2015-05-23 LAB — FERRITIN: Ferritin: 15 ng/mL (ref 15–150)

## 2015-05-24 ENCOUNTER — Other Ambulatory Visit: Payer: Self-pay

## 2015-05-24 DIAGNOSIS — D649 Anemia, unspecified: Secondary | ICD-10-CM

## 2015-05-30 ENCOUNTER — Ambulatory Visit: Payer: Medicare Other | Admitting: Family Medicine

## 2015-06-02 ENCOUNTER — Encounter (HOSPITAL_COMMUNITY): Payer: Medicare Other | Attending: Oncology | Admitting: Oncology

## 2015-06-02 ENCOUNTER — Encounter (HOSPITAL_COMMUNITY): Payer: Self-pay | Admitting: Oncology

## 2015-06-02 VITALS — BP 161/75 | HR 67 | Temp 99.1°F | Resp 16 | Wt 94.8 lb

## 2015-06-02 DIAGNOSIS — J449 Chronic obstructive pulmonary disease, unspecified: Secondary | ICD-10-CM

## 2015-06-02 DIAGNOSIS — D500 Iron deficiency anemia secondary to blood loss (chronic): Secondary | ICD-10-CM | POA: Diagnosis not present

## 2015-06-02 DIAGNOSIS — M810 Age-related osteoporosis without current pathological fracture: Secondary | ICD-10-CM

## 2015-06-02 DIAGNOSIS — I10 Essential (primary) hypertension: Secondary | ICD-10-CM | POA: Diagnosis not present

## 2015-06-02 NOTE — Patient Instructions (Signed)
[place] at [facility] Discharge Instructions  RECOMMENDATIONS MADE BY THE CONSULTANT AND ANY TEST RESULTS WILL BE SENT TO YOUR REFERRING PHYSICIAN.  Exam and discussion by Robynn Pane, PA-C Plans are to give you 2 ferric gluconate infusions.   Will recheck your blood work and see you back in 8 weeks.  Thank you for choosing [place] at [facility] to provide your oncology and hematology care.  To afford each patient quality time with our provider, please arrive at least 15 minutes before your scheduled appointment time.    You need to re-schedule your appointment should you arrive 10 or more minutes late.  We strive to give you quality time with our providers, and arriving late affects you and other patients whose appointments are after yours.  Also, if you no show three or more times for appointments you may be dismissed from the clinic at the providers discretion.     Again, thank you for choosing [facility].  Our hope is that these requests will decrease the amount of time that you wait before being seen by our physicians.       _____________________________________________________________  Should you have questions after your visit to [facility], please contact our office at [phone] between the hours of 8:30 a.m. and 4:30 p.m.  Voicemails left after 4:30 p.m. will not be returned until the following business day.  For prescription refill requests, have your pharmacy contact our office.

## 2015-06-02 NOTE — Progress Notes (Signed)
Sallee Lange, MD [place] [id number]  Iron deficiency anemia due to chronic blood loss  CURRENT THERAPY: intermittent IV iron.  Oncology Flowsheet 10/13/2013 05/31/2014 06/07/2014  ferumoxytol (FERAHEME) IV 1,020 mg 510 mg 510 mg    INTERVAL HISTORY: Carol Massey 81 y.o. 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 On her prior EGD gastric lesions were seen.  Patient was last seen in Oct 2015 as she cancelled her follow-up appointment 3 months later on 09/02/2014 and she never called back to reschedule.  She notes that she has been told her iron deficiency is secondary to GI bleeding.  Since has hasn't seen any blood, she thought she did not need to see [place] anymore because she was no longer bleeding.  I used this opportunity to provide teaching regarding chronic iron deficiency anemia secondary to chronic GI blood loss.  I utilized an analogy to illustrate this teaching moment.  The patient, her husband, and son are appreciative of this information.  She has fortunately seen Dr. Wolfgang Phoenix who performed iron studies along with CBCs.  Her ferritin is declining as would be expected in her situation.  I personally reviewed and went over laboratory results with the patient.  The results are noted within this dictation.  She denies any complaints today.    Past Medical History  Diagnosis Date  . Stroke ([place])   . Hypertension   . Osteoporosis   . COPD (chronic obstructive pulmonary disease) ([place])   . Anemia   . Hard of hearing   . Iron deficiency anemia due to chronic blood loss 05/27/2014    has COPD (chronic obstructive pulmonary disease) ([place]); Osteoporosis; Hyperlipemia; and Iron deficiency anemia due to chronic blood loss on her problem list.     has No Known Allergies.  Current Outpatient Prescriptions on File Prior to Visit  Medication Sig Dispense Refill  . alendronate (FOSAMAX)  70 MG tablet Take 1 tablet (70 mg total) by mouth every 7 (seven) days. Take with a full glass of water on an empty stomach. 12 tablet 1  . ALPRAZolam (XANAX) 0.25 MG tablet Take 1 tablet (0.25 mg total) by mouth 2 (two) times daily as needed for anxiety. 45 tablet 3  . aspirin 81 MG tablet Take 81 mg by mouth daily.    . Calcium Carb-Cholecalciferol (CALCIUM + D3 PO) Take 1 capsule by mouth daily.     . citalopram (CELEXA) 20 MG tablet Take 1 tablet (20 mg total) by mouth daily. 90 tablet 1  . IRON PO Take 65 mg by mouth daily.     [name] PROVENTIL HFA 108 (90 BASE) MCG/ACT inhaler INHALE 2 PUFFS INTO THE LUNGS EVERY 6 (SIX) HOURS AS NEEDED FOR WHEEZING. 20.1 Inhaler 2  . UNABLE TO FIND Vit C one every day     No current facility-administered medications on file prior to visit.    Past Surgical History  Procedure Laterality Date  . Appendectomy    . Abdominal hysterectomy    . Tonsillectomy    . Colonoscopy  03/2005    Dr. Gala Romney: rectal and rectosigmoid polyps removed  . Esophagogastroduodenoscopy  03/2005    Dr. Gala Romney: tiney distal esophageal erosions  . Small bowel capsule study  03/2005    Dr. Laural Golden: two small Dieulafoy lesions at ascending colon. tiney specks of blood at duodenal bulb but no identified lesions  .  Colonoscopy with esophagogastroduodenoscopy (egd) N/A 08/12/2013    Procedure: COLONOSCOPY WITH ESOPHAGOGASTRODUODENOSCOPY (EGD);  Surgeon: Daneil Dolin, MD;  Location: [facility];  Service: Endoscopy;  Laterality: N/A;  12:30    Denies any headaches, dizziness, double vision, fevers, chills, night sweats, nausea, vomiting, diarrhea, constipation, chest pain, heart palpitations, shortness of breath, blood in stool, black tarry stool, urinary pain, urinary burning, urinary frequency, hematuria.   PHYSICAL EXAMINATION  ECOG PERFORMANCE STATUS: 1 - Symptomatic but completely ambulatory  There were no vitals filed for this visit.  GENERAL:alert, no distress, well nourished,  well developed, comfortable, cooperative, smiling, hard of hearing, accompanied by her son and husband. SKIN: skin color, texture, turgor are normal, no rashes or significant lesions HEAD: Normocephalic, No masses, lesions, tenderness or abnormalities EYES: normal, PERRLA, EOMI, Conjunctiva are pink and non-injected EARS: External ears normal OROPHARYNX:lips, buccal mucosa, and tongue normal and mucous membranes are moist  NECK: supple, trachea midline BREAST:not examined LUNGS: clear to auscultation  HEART: regular rate & rhythm, no gallops, S1 normal, S2 normal and systolic ejection murmur, 2/6, heard best a LSB 2nd ICS, crescendo in nature. ABDOMEN:abdomen soft and normal bowel sounds BACK: Back symmetric, no curvature., No CVA tenderness EXTREMITIES:less then 2 second capillary refill, no joint deformities, effusion, or inflammation, no edema, no skin discoloration, no clubbing, no cyanosis  NEURO: alert & oriented x 3 with fluent speech, no focal motor/sensory deficits, gait normal    LABORATORY DATA: CBC    Component Value Date/Time   WBC 5.7 05/22/2015 0948   WBC 5.7 07/05/2014 1057   RBC 4.13 05/22/2015 0948   RBC 4.16 07/05/2014 1057   RBC 4.11 10/13/2013 1445   HGB 11.0* 12/03/2014 0805   HGB 10.6* 06/17/2013 1143   HCT 33.3* 05/22/2015 0948   HCT 34.5 12/03/2014 0805   PLT 284 12/03/2014 0805   MCV 90 12/03/2014 0805   MCH 24.7* 05/22/2015 0948   MCH 29.3 07/05/2014 1057   MCHC 30.6* 05/22/2015 0948   MCHC 32.4 07/05/2014 1057   RDW 17.0* 05/22/2015 0948   RDW 20.9* 07/05/2014 1057   LYMPHSABS 1.6 05/22/2015 0948   LYMPHSABS 1.2 05/27/2014 1124   MONOABS 0.4 05/27/2014 1124   EOSABS 0.1 12/03/2014 0805   EOSABS 0.1 05/27/2014 1124   BASOSABS 0.1 05/22/2015 0948   BASOSABS 0.1 05/27/2014 1124      Chemistry      Component Value Date/Time   NA 138 05/22/2015 0948   NA 138 10/13/2013 1445   K 4.2 05/22/2015 0948   CL 97 05/22/2015 0948   CO2 26 05/22/2015  0948   BUN 10 05/22/2015 0948   BUN 13 10/13/2013 1445   CREATININE 0.75 05/22/2015 0948   CREATININE 0.60 06/17/2013 1241      Component Value Date/Time   CALCIUM 10.2 05/22/2015 0948   ALKPHOS 129* 10/13/2013 1445   AST 20 10/13/2013 1445   ALT 14 10/13/2013 1445   BILITOT 0.3 10/13/2013 1445      Lab Results  Component Value Date   IRON 22* 10/13/2013   TIBC 404 10/13/2013   FERRITIN 15 05/22/2015     PENDING LABS:   RADIOGRAPHIC STUDIES:  No results found.   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 On her prior EGD gastric lesions were seen.  She failed to return as scheduled in Jan 2016,  lending to medical noncompliance.  I personally reviewed and went over laboratory results with the patient.  The results are noted within this dictation.  This is my first encounter with Ms. Amsler.  Based upon her iron deficit calculation, she has a deficit of 350.  I will get her set-up for 2 doses of ferric gluconate.  Repeat labs in 6 weeks: CBC diff, iron/TIBC, ferritin  Return in 6 weeks for follow-up.    THERAPY PLAN:  2 doses of ferric gluconate IV 125 mg.  All questions were answered. The patient knows to call the clinic with any problems, questions or concerns. We can certainly see the patient much sooner if necessary.  Patient and plan discussed with Dr. Ancil Linsey and she is in agreement with the aforementioned.   This note is electronically signed by: Robynn Pane, PA-C 06/02/2015 2:00 PM

## 2015-06-02 NOTE — Assessment & Plan Note (Addendum)
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 On her prior EGD gastric lesions were seen.  She failed to return as scheduled in Jan 2016, lending to medical noncompliance.  I personally reviewed and went over laboratory results with the patient.  The results are noted within this dictation.  This is my first encounter with Carol Massey.  Based upon her iron deficit calculation, she has a deficit of 350.  I will get her set-up for 2 doses of ferric gluconate.  Repeat labs in 8 weeks: CBC diff, iron/TIBC, ferritin  Return in 8 weeks for follow-up.

## 2015-06-07 ENCOUNTER — Encounter (HOSPITAL_BASED_OUTPATIENT_CLINIC_OR_DEPARTMENT_OTHER): Payer: Medicare Other

## 2015-06-07 ENCOUNTER — Encounter (HOSPITAL_COMMUNITY): Payer: Self-pay

## 2015-06-07 VITALS — BP 168/70 | HR 65 | Temp 98.8°F | Resp 18

## 2015-06-07 DIAGNOSIS — D500 Iron deficiency anemia secondary to blood loss (chronic): Secondary | ICD-10-CM

## 2015-06-07 MED ORDER — SODIUM CHLORIDE 0.9 % IV SOLN
INTRAVENOUS | Status: DC
  Administered 2015-06-07: 13:00:00 via INTRAVENOUS

## 2015-06-07 MED ORDER — SODIUM CHLORIDE 0.9 % IV SOLN
125.0000 mg | Freq: Once | INTRAVENOUS | Status: AC
  Administered 2015-06-07: 125 mg via INTRAVENOUS
  Filled 2015-06-07: qty 10

## 2015-06-07 NOTE — Progress Notes (Signed)
Tolerated infusion w/o adverse reaction.  A&Ox4; VSS.  Discharged ambulatory. 

## 2015-06-07 NOTE — Patient Instructions (Signed)
.  [place] at [facility] Discharge Instructions  RECOMMENDATIONS MADE BY THE CONSULTANT AND ANY TEST RESULTS WILL BE SENT TO YOUR REFERRING PHYSICIAN.  Iron infusion today. Return as scheduled for iron infusion.  Thank you for choosing [place] at [facility] to provide your oncology and hematology care.  To afford each patient quality time with our provider, please arrive at least 15 minutes before your scheduled appointment time.    You need to re-schedule your appointment should you arrive 10 or more minutes late.  We strive to give you quality time with our providers, and arriving late affects you and other patients whose appointments are after yours.  Also, if you no show three or more times for appointments you may be dismissed from the clinic at the providers discretion.     Again, thank you for choosing [facility].  Our hope is that these requests will decrease the amount of time that you wait before being seen by our physicians.       _____________________________________________________________  Should you have questions after your visit to [facility], please contact our office at [phone] between the hours of 8:30 a.m. and 4:30 p.m.  Voicemails left after 4:30 p.m. will not be returned until the following business day.  For prescription refill requests, have your pharmacy contact our office.

## 2015-06-08 ENCOUNTER — Other Ambulatory Visit: Payer: Self-pay | Admitting: Family Medicine

## 2015-06-08 NOTE — Telephone Encounter (Signed)
May have this +5 refills 

## 2015-06-12 ENCOUNTER — Encounter (HOSPITAL_BASED_OUTPATIENT_CLINIC_OR_DEPARTMENT_OTHER): Payer: Medicare Other

## 2015-06-12 VITALS — BP 143/75 | HR 71 | Temp 98.2°F | Resp 16

## 2015-06-12 DIAGNOSIS — D500 Iron deficiency anemia secondary to blood loss (chronic): Secondary | ICD-10-CM

## 2015-06-12 MED ORDER — SODIUM CHLORIDE 0.9 % IV SOLN
INTRAVENOUS | Status: DC
  Administered 2015-06-12: 13:00:00 via INTRAVENOUS

## 2015-06-12 MED ORDER — NA FERRIC GLUC CPLX IN SUCROSE 12.5 MG/ML IV SOLN
125.0000 mg | Freq: Once | INTRAVENOUS | Status: AC
  Administered 2015-06-12: 125 mg via INTRAVENOUS
  Filled 2015-06-12: qty 10

## 2015-06-12 NOTE — Patient Instructions (Addendum)
[place] at [facility] Discharge Instructions  RECOMMENDATIONS MADE BY THE CONSULTANT AND ANY TEST RESULTS WILL BE SENT TO YOUR REFERRING PHYSICIAN.  You received ferric gluconate 125 mg iron infusion today. Return as scheduled.  Thank you for choosing [place] at [facility] to provide your oncology and hematology care.  To afford each patient quality time with our provider, please arrive at least 15 minutes before your scheduled appointment time.    You need to re-schedule your appointment should you arrive 10 or more minutes late.  We strive to give you quality time with our providers, and arriving late affects you and other patients whose appointments are after yours.  Also, if you no show three or more times for appointments you may be dismissed from the clinic at the providers discretion.     Again, thank you for choosing [facility].  Our hope is that these requests will decrease the amount of time that you wait before being seen by our physicians.       _____________________________________________________________  Should you have questions after your visit to [facility], please contact our office at [phone] between the hours of 8:30 a.m. and 4:30 p.m.  Voicemails left after 4:30 p.m. will not be returned until the following business day.  For prescription refill requests, have your pharmacy contact our office.

## 2015-06-12 NOTE — Progress Notes (Signed)
Tolerated iron infusion well. 

## 2015-06-14 ENCOUNTER — Telehealth: Payer: Self-pay | Admitting: Family Medicine

## 2015-06-14 ENCOUNTER — Emergency Department (HOSPITAL_COMMUNITY): Payer: Medicare Other

## 2015-06-14 ENCOUNTER — Encounter (HOSPITAL_COMMUNITY): Payer: Self-pay | Admitting: Emergency Medicine

## 2015-06-14 ENCOUNTER — Emergency Department (HOSPITAL_COMMUNITY)
Admission: EM | Admit: 2015-06-14 | Discharge: 2015-06-14 | Disposition: A | Discharge status: Home / Self Care | Payer: Medicare Other | Attending: Emergency Medicine | Admitting: Emergency Medicine

## 2015-06-14 DIAGNOSIS — Y92009 Unspecified place in unspecified non-institutional (private) residence as the place of occurrence of the external cause: Secondary | ICD-10-CM

## 2015-06-14 DIAGNOSIS — Z79899 Other long term (current) drug therapy: Secondary | ICD-10-CM | POA: Diagnosis not present

## 2015-06-14 DIAGNOSIS — I639 Cerebral infarction, unspecified: Secondary | ICD-10-CM | POA: Diagnosis not present

## 2015-06-14 DIAGNOSIS — Z720 Tobacco use: Secondary | ICD-10-CM | POA: Diagnosis not present

## 2015-06-14 DIAGNOSIS — Y998 Other external cause status: Secondary | ICD-10-CM | POA: Diagnosis not present

## 2015-06-14 DIAGNOSIS — S0101XA Laceration without foreign body of scalp, initial encounter: Secondary | ICD-10-CM | POA: Diagnosis not present

## 2015-06-14 DIAGNOSIS — W01198A Fall on same level from slipping, tripping and stumbling with subsequent striking against other object, initial encounter: Secondary | ICD-10-CM | POA: Insufficient documentation

## 2015-06-14 DIAGNOSIS — I10 Essential (primary) hypertension: Secondary | ICD-10-CM | POA: Diagnosis not present

## 2015-06-14 DIAGNOSIS — S0001XA Abrasion of scalp, initial encounter: Secondary | ICD-10-CM | POA: Diagnosis not present

## 2015-06-14 DIAGNOSIS — W19XXXA Unspecified fall, initial encounter: Secondary | ICD-10-CM

## 2015-06-14 DIAGNOSIS — D500 Iron deficiency anemia secondary to blood loss (chronic): Secondary | ICD-10-CM | POA: Diagnosis not present

## 2015-06-14 DIAGNOSIS — Y9289 Other specified places as the place of occurrence of the external cause: Secondary | ICD-10-CM | POA: Diagnosis not present

## 2015-06-14 DIAGNOSIS — H9190 Unspecified hearing loss, unspecified ear: Secondary | ICD-10-CM | POA: Insufficient documentation

## 2015-06-14 DIAGNOSIS — Z7982 Long term (current) use of aspirin: Secondary | ICD-10-CM | POA: Insufficient documentation

## 2015-06-14 DIAGNOSIS — Y9389 Activity, other specified: Secondary | ICD-10-CM | POA: Insufficient documentation

## 2015-06-14 DIAGNOSIS — S0093XA Contusion of unspecified part of head, initial encounter: Secondary | ICD-10-CM

## 2015-06-14 DIAGNOSIS — S0083XA Contusion of other part of head, initial encounter: Secondary | ICD-10-CM | POA: Insufficient documentation

## 2015-06-14 DIAGNOSIS — J449 Chronic obstructive pulmonary disease, unspecified: Secondary | ICD-10-CM | POA: Diagnosis not present

## 2015-06-14 DIAGNOSIS — Z8673 Personal history of transient ischemic attack (TIA), and cerebral infarction without residual deficits: Secondary | ICD-10-CM | POA: Diagnosis not present

## 2015-06-14 DIAGNOSIS — S0990XA Unspecified injury of head, initial encounter: Secondary | ICD-10-CM | POA: Diagnosis present

## 2015-06-14 DIAGNOSIS — Z8739 Personal history of other diseases of the musculoskeletal system and connective tissue: Secondary | ICD-10-CM | POA: Insufficient documentation

## 2015-06-14 MED ORDER — ACETAMINOPHEN 325 MG PO TABS
650.0000 mg | ORAL_TABLET | Freq: Once | ORAL | Status: AC
  Administered 2015-06-14: 650 mg via ORAL
  Filled 2015-06-14: qty 2

## 2015-06-14 NOTE — ED Notes (Signed)
Patient with no complaints at this time. Respirations even and unlabored. Skin warm/dry. Discharge instructions reviewed with patient at this time. Patient given opportunity to voice concerns/ask questions. Patient discharged at this time and left Emergency Department with steady gait.   

## 2015-06-14 NOTE — ED Provider Notes (Signed)
CSN: [id number]     Arrival date & time 06/14/15  0277 History   First MD Initiated Contact with Patient 06/14/15 0545    Chief Complaint  Patient presents with  . Fall     (Consider location/radiation/quality/duration/timing/severity/associated sxs/prior Treatment) HPI patient reports this morning she had used the bathroom and she got up too quickly because of the need to get to the bathroom. She states she normally sits for a minute or 2 before she stands up because she gets dizziness. She states today she stood up and she lost her balance and stumbled and fell. She states she hit her head on a stereo and then the floor. Her husband states he heard her fall and immediately got up and she was awake. She does not describe anything that sounds like loss of consciousness. She states she has some pain in her head and had some bleeding. She also states she has some minor discomfort in her left shoulder but she has excellent range of motion motion of her shoulder without significant pain. She denies being on any blood thinners other than a 81 mg aspirin a day.  PCP Dr Wolfgang Phoenix  Past Medical History  Diagnosis Date  . Stroke ([place])   . Hypertension   . Osteoporosis   . COPD (chronic obstructive pulmonary disease) ([place])   . Anemia   . Hard of hearing   . Iron deficiency anemia due to chronic blood loss 05/27/2014   Past Surgical History  Procedure Laterality Date  . Appendectomy    . Abdominal hysterectomy    . Tonsillectomy    . Colonoscopy  03/2005    Dr. Gala Romney: rectal and rectosigmoid polyps removed  . Esophagogastroduodenoscopy  03/2005    Dr. Gala Romney: tiney distal esophageal erosions  . Small bowel capsule study  03/2005    Dr. Laural Golden: two small Dieulafoy lesions at ascending colon. tiney specks of blood at duodenal bulb but no identified lesions  . Colonoscopy with esophagogastroduodenoscopy (egd) N/A 08/12/2013    Procedure: COLONOSCOPY WITH ESOPHAGOGASTRODUODENOSCOPY (EGD);  Surgeon:  Daneil Dolin, MD;  Location: [facility];  Service: Endoscopy;  Laterality: N/A;  12:30   Family History  Problem Relation Age of Onset  . Hypertension Mother   . Leukemia Father 73  . Stroke Mother    Social History  Substance Use Topics  . Smoking status: Current Some Day Smoker -- 70 years  . Smokeless tobacco: Never Used     Comment: Occasionally smokes a cigerette  . Alcohol Use: No   Lives at home] Lives with spouse Smokes 1/2 ppd  OB History    No data available     Review of Systems  All other systems reviewed and are negative.     Allergies  Review of patient's allergies indicates no known allergies.  Home Medications   Prior to Admission medications   Medication Sig Start Date End Date Taking? Authorizing Provider  alendronate (FOSAMAX) 70 MG tablet Take 1 tablet (70 mg total) by mouth every 7 (seven) days. Take with a full glass of water on an empty stomach. 01/05/15   [name], MD  ALPRAZolam Duanne Moron) 0.25 MG tablet TAKE 1 TABLET TWICE A DAY AS NEEDED 06/08/15   [name], MD  aspirin 81 MG tablet Take 81 mg by mouth daily.    Historical Provider, MD  Calcium Carb-Cholecalciferol (CALCIUM + D3 PO) Take 1 capsule by mouth daily.     Historical Provider, MD  citalopram (CELEXA)  20 MG tablet TAKE 1 TABLET (20 MG TOTAL) BY MOUTH DAILY. 06/08/15   [name], MD  IRON PO Take 65 mg by mouth daily.     Historical Provider, MD  PROVENTIL HFA 108 (90 BASE) MCG/ACT inhaler INHALE 2 PUFFS INTO THE LUNGS EVERY 6 (SIX) HOURS AS NEEDED FOR WHEEZING. 04/24/15   Mikey Kirschner, MD  UNABLE TO FIND Vit C one every day    Historical Provider, MD   BP 159/66 mmHg  Pulse 70  Temp(Src) 98.3 F (36.8 C) (Oral)  Resp 18  Ht 4\' 10"  (1.473 m)  Wt 95 lb (43.092 kg)  BMI 19.86 kg/m2  SpO2 96%  Vital signs normal   Physical Exam  Constitutional: She is oriented to person, place, and time. She appears well-developed and well-nourished.  Non-toxic appearance.  She does not appear ill. No distress.  HENT:  Head: Normocephalic.    Right Ear: External ear normal.  Left Ear: External ear normal.  Nose: Nose normal. No mucosal edema or rhinorrhea.  Mouth/Throat: Oropharynx is clear and moist and mucous membranes are normal. No dental abscesses or uvula swelling.  Patient has some tenderness on the top of her head with mild swelling. She is noted to have some lead in her hair posterior to her left ear. When the scalp was examined it appears to be a abrasion rather than a laceration. Nursing staff is going to clean the blood out of her hair for better visualization. She also has some redness to the helix of her middle section ear  Eyes: Conjunctivae and EOM are normal. Pupils are equal, round, and reactive to light.  Neck: Normal range of motion and full passive range of motion without pain. Neck supple.  Neck appears to be nontender to palpation however with her head contusion CT of her neck was also ordered.  Cardiovascular: Normal rate, regular rhythm and normal heart sounds.  Exam reveals no gallop and no friction rub.   No murmur heard. Pulmonary/Chest: Effort normal and breath sounds normal. No respiratory distress. She has no wheezes. She has no rhonchi. She has no rales. She exhibits no tenderness and no crepitus.  Abdominal: Soft. Normal appearance and bowel sounds are normal. She exhibits no distension. There is no tenderness. There is no rebound and no guarding.  Musculoskeletal: Normal range of motion. She exhibits no edema or tenderness.  Moves all extremities well.   Neurological: She is alert and oriented to person, place, and time. She has normal strength. No cranial nerve deficit.  Skin: Skin is warm, dry and intact. No rash noted. No erythema. No pallor.  Psychiatric: She has a normal mood and affect. Her speech is normal and behavior is normal. Her mood appears not anxious.  Nursing note and vitals reviewed.   ED Course  Procedures  (including critical care time) Medications  acetaminophen (TYLENOL) tablet 650 mg (650 mg Oral Given 06/14/15 0646)   Patient was given Tylenol for pain. We discussed getting a CT of her head and neck due to her fall. Her wound was cleaned and on visual inspection appears to be an abrasion. I cannot pull the edges apart.  7:05 AM patient and family were given her CT results. She is feeling better. She is ready to be discharged.  Labs Review Labs Reviewed - No data to display  Imaging Review Ct Head Wo Contrast  Ct Cervical Spine Wo Contrast  06/14/2015  CLINICAL DATA:  [place] getting out of bed, lost balance.  Laceration LEFT side of head. Unsure if head injury or loss of consciousness. History of hypertension, stroke. EXAM: CT HEAD WITHOUT CONTRAST CT CERVICAL SPINE WITHOUT CONTRAST TECHNIQUE: Multidetector CT imaging of the head and cervical spine was performed following the standard protocol without intravenous contrast. Multiplanar CT image reconstructions of the cervical spine were also generated. COMPARISON:  None. FINDINGS: CT HEAD FINDINGS The ventricles and sulci are normal for age. No intraparenchymal hemorrhage, mass effect nor midline shift. Patchy supratentorial white matter hypodensities are within normal range for patient's age and though non-specific suggest sequelae of chronic small vessel ischemic disease. No acute large vascular territory infarcts. No abnormal extra-axial fluid collections. Basal cisterns are patent. Moderate calcific atherosclerosis of the carotid siphons. No skull fracture ; patient is osteopenic. Small LEFT parietal scalp hematoma with subcutaneous gas, no radiopaque foreign bodies. The included ocular globes and orbital contents are non-suspicious. The mastoid aircells and included paranasal sinuses are well-aerated. Patient is edentulous. CT CERVICAL SPINE FINDINGS Cervical vertebral bodies and posterior elements are intact. Grade 1 C4-5 anterolisthesis on  degenerative basis, no spondylolysis. Moderate to severe C4-5 and C5-6 disc height loss, moderate C3-4 and C6-7 with intradiscal calcifications. Uncovertebral hypertrophy, endplate spurring results in severe bilateral C5-6 neural foraminal narrowing. Mild canal stenosis C3-4, C4-5, C5-6 and C6-7. Coarse calcifications LEFT thyroid. No prevertebral soft tissue swelling. IMPRESSION: CT HEAD: Small LEFT parietal scalp hematoma with laceration. No skull fracture. Negative noncontrast CT head for age. CT CERVICAL SPINE: No acute cervical spine fracture. Grade 1 C4-5 anterolisthesis on degenerative basis. Electronically Signed   By: Elon Alas M.D.   On: 06/14/2015 06:50   I have personally reviewed and evaluated these images and lab results as part of my medical decision-making.   EKG Interpretation None      MDM   Final diagnoses:  Fall at home, initial encounter  Abrasion of scalp, initial encounter  Contusion of head, initial encounter    Plan discharge  Rolland Porter, MD, Barbette Or, MD 06/14/15 [id number]

## 2015-06-14 NOTE — ED Notes (Signed)
Head cleaned with peroxide, small amount of bleeding noted

## 2015-06-14 NOTE — Discharge Instructions (Signed)
Keep the abrasion clean. You can take acetaminophen 650 mg every 6 hrs for pain as needed. Ice packs to the sore areas. Return to the ED for any problems listed on the head injury sheet.     Cryotherapy Cryotherapy is when you put ice on your injury. Ice helps lessen pain and puffiness (swelling) after an injury. Ice works the best when you start using it in the first 24 to 48 hours after an injury. HOME CARE  Put a dry or damp towel between the ice pack and your skin.  You may press gently on the ice pack.  Leave the ice on for no more than 10 to 20 minutes at a time.  Check your skin after 5 minutes to make sure your skin is okay.  Rest at least 20 minutes between ice pack uses.  Stop using ice when your skin loses feeling (numbness).  Do not use ice on someone who cannot tell you when it hurts. This includes small children and people with memory problems (dementia). GET HELP RIGHT AWAY IF:  You have white spots on your skin.  Your skin turns blue or pale.  Your skin feels waxy or hard.  Your puffiness gets worse. MAKE SURE YOU:   Understand these instructions.  Will watch your condition.  Will get help right away if you are not doing well or get worse.   This information is not intended to replace advice given to you by your health care provider. Make sure you discuss any questions you have with your health care provider.   Document Released: 01/29/2008 Document Revised: 11/04/2011 Document Reviewed: 04/04/2011 Elsevier Interactive Patient Education 2016 [place] An abrasion is a cut or scrape on the surface of your skin. An abrasion does not go through all of the layers of your skin. It is important to take good care of your abrasion to prevent infection. HOME CARE Medicines  Take or apply medicines only as told by your doctor.  If you were prescribed an antibiotic ointment, finish all of it even if you start to feel better. Wound Care  Clean  the wound with mild soap and water 2-3 times per day or as told by your doctor. Pat your wound dry with a clean towel. Do not rub it.  There are many ways to close and cover a wound. Follow instructions from your doctor about:  How to take care of your wound.  When and how you should change your bandage (dressing).  When and how you should take off your dressing.  Check your wound every day for signs of infection. Watch for:  Redness, swelling, or pain.  Fluid, blood, or pus. General Instructions  Keep the dressing dry as told by your doctor. Do not take baths, swim, use a hot tub, or do anything that would put your wound underwater until your doctor says it is okay.  If there is swelling, raise (elevate) the injured area above the level of your heart while you are sitting or lying down.  Keep all follow-up visits as told by your doctor. This is important. GET HELP IF:  You were given a tetanus shot and you have any of these where the needle went in:  Swelling.  Very bad pain.  Redness.  Bleeding.  Medicine does not help your pain.  You have any of these at the site of the wound:  More redness.  More swelling.  More pain. GET HELP RIGHT AWAY IF:  You have a red streak going away from your wound.  You have a fever.  You have fluid, blood, or pus coming from your wound.  There is a bad smell coming from your wound.   This information is not intended to replace advice given to you by your health care provider. Make sure you discuss any questions you have with your health care provider.   Document Released: 01/29/2008 Document Revised: 12/27/2014 Document Reviewed: 08/10/2014 Elsevier Interactive Patient Education 2016 [place] A contusion is a deep bruise. Contusions happen when an injury causes bleeding under the skin. Symptoms of bruising include pain, swelling, and discolored skin. The skin may turn blue, purple, or yellow. HOME CARE    Rest the injured area.  If told, put ice on the injured area.  Put ice in a plastic bag.  Place a towel between your skin and the bag.  Leave the ice on for 20 minutes, 2-3 times per day.  If told, put light pressure (compression) on the injured area using an elastic bandage. Make sure the bandage is not too tight. Remove it and put it back on as told by your doctor.  If possible, raise (elevate) the injured area above the level of your heart while you are sitting or lying down.  Take over-the-counter and prescription medicines only as told by your doctor. GET HELP IF:  Your symptoms do not get better after several days of treatment.  Your symptoms get worse.  You have trouble moving the injured area. GET HELP RIGHT AWAY IF:   You have very bad pain.  You have a loss of feeling (numbness) in a hand or foot.  Your hand or foot turns pale or cold.   This information is not intended to replace advice given to you by your health care provider. Make sure you discuss any questions you have with your health care provider.   Document Released: 01/29/2008 Document Revised: 05/03/2015 Document Reviewed: 12/28/2014 Elsevier Interactive Patient Education 2016 Elsevier Inc[employer]  Hematoma A hematoma is a collection of blood. The collection of blood can turn into a hard, painful lump under the skin. Your skin may turn blue or yellow if the hematoma is close to the surface of the skin. Most hematomas get better in a few days to weeks. Some hematomas are serious and need medical care. Hematomas can be very small or very big. HOME CARE  Apply ice to the injured area:  Put ice in a plastic bag.  Place a towel between your skin and the bag.  Leave the ice on for 20 minutes, 2-3 times a day for the first 1 to 2 days.  After the first 2 days, switch to using warm packs on the injured area.  Raise (elevate) the injured area to lessen pain and puffiness (swelling). You may also wrap the  area with an elastic bandage. Make sure the bandage is not wrapped too tight.  If you have a painful hematoma on your leg or foot, you may use crutches for a couple days.  Only take medicines as told by your doctor. GET HELP RIGHT AWAY IF:   Your pain gets worse.  Your pain is not controlled with medicine.  You have a fever.  Your puffiness gets worse.  Your skin turns more blue or yellow.  Your skin over the hematoma breaks or starts bleeding.  Your hematoma is in your chest or belly (abdomen) and you are short of breath, feel weak, or  have a change in consciousness.  Your hematoma is on your scalp and you have a headache that gets worse or a change in alertness or consciousness. MAKE SURE YOU:   Understand these instructions.  Will watch your condition.  Will get help right away if you are not doing well or get worse.   This information is not intended to replace advice given to you by your health care provider. Make sure you discuss any questions you have with your health care provider.   Document Released: 09/19/2004 Document Revised: 04/14/2013 Document Reviewed: 01/20/2013 Elsevier Interactive Patient Education [employer].

## 2015-06-14 NOTE — ED Notes (Signed)
Pt states she got out of bed too quickly and lost her balance. Pt has laceration to the left side of head. She is unsure what her head hit or if there was any loc.

## 2015-06-14 NOTE — Telephone Encounter (Signed)
FYI  Pts son calling to let you know that she fell an went to the Hospital,  Minor injuries an they did not say she needed to follow up here. [name] just wanted you to know this happened.

## 2015-06-26 ENCOUNTER — Other Ambulatory Visit: Payer: Self-pay | Admitting: Family Medicine

## 2015-07-24 ENCOUNTER — Ambulatory Visit (INDEPENDENT_AMBULATORY_CARE_PROVIDER_SITE_OTHER): Payer: Medicare Other | Admitting: Family Medicine

## 2015-07-24 ENCOUNTER — Encounter: Payer: Self-pay | Admitting: Family Medicine

## 2015-07-24 VITALS — BP 112/82 | Ht 60.0 in | Wt 95.6 lb

## 2015-07-24 DIAGNOSIS — J438 Other emphysema: Secondary | ICD-10-CM | POA: Diagnosis not present

## 2015-07-24 DIAGNOSIS — M810 Age-related osteoporosis without current pathological fracture: Secondary | ICD-10-CM

## 2015-07-24 DIAGNOSIS — F325 Major depressive disorder, single episode, in full remission: Secondary | ICD-10-CM | POA: Diagnosis not present

## 2015-07-24 DIAGNOSIS — E785 Hyperlipidemia, unspecified: Secondary | ICD-10-CM

## 2015-07-24 DIAGNOSIS — F411 Generalized anxiety disorder: Secondary | ICD-10-CM | POA: Diagnosis not present

## 2015-07-24 HISTORY — DX: Generalized anxiety disorder: F41.1

## 2015-07-24 HISTORY — DX: Major depressive disorder, single episode, in full remission: F32.5

## 2015-07-24 NOTE — Progress Notes (Signed)
   Subjective:    Patient ID: Carol Massey, female    DOB: [birth year], 81 y.o.   MRN: [id number]  Hyperlipidemia This is a chronic problem. The current episode started more than 1 year ago. Pertinent negatives include no chest pain. Current antihyperlipidemic treatment includes diet change. There are no compliance problems.     patient has history of COPD Patient smokes she does not want to quit she is been counseled to quit.  Review of Systems  Constitutional: Negative for activity change, appetite change and fatigue.  HENT: Negative for congestion.   Respiratory: Negative for cough.   Cardiovascular: Negative for chest pain.  Gastrointestinal: Negative for abdominal pain.  Endocrine: Negative for polydipsia and polyphagia.  Neurological: Negative for weakness.  Psychiatric/Behavioral: Negative for confusion.       Objective:   Physical Exam  Constitutional: She appears well-nourished. No distress.  Cardiovascular: Normal rate, regular rhythm and normal heart sounds.   No murmur heard. Pulmonary/Chest: Effort normal and breath sounds normal. No respiratory distress.  Musculoskeletal: She exhibits no edema.  Lymphadenopathy:    She has no cervical adenopathy.  Neurological: She is alert. She exhibits normal muscle tone.  Psychiatric: Her behavior is normal.  Vitals reviewed.         Assessment & Plan:   hyperlipidemia -continue medication check lab work Moods overall are doing well denies being depressed currently   osteoporosis takes Fosamax once weekly does well with this  mild intermittent anxiety issues doing well overall COPD patient does not Carol Massey quit smoking and does not want to be on any inhalers relatively asymptomatic

## 2015-07-28 ENCOUNTER — Encounter (HOSPITAL_BASED_OUTPATIENT_CLINIC_OR_DEPARTMENT_OTHER): Payer: Medicare Other

## 2015-07-28 ENCOUNTER — Encounter (HOSPITAL_COMMUNITY): Payer: Self-pay | Admitting: Oncology

## 2015-07-28 ENCOUNTER — Encounter (HOSPITAL_COMMUNITY): Payer: Medicare Other | Attending: Oncology | Admitting: Oncology

## 2015-07-28 VITALS — BP 153/58 | HR 61 | Temp 99.1°F | Resp 16 | Wt 95.3 lb

## 2015-07-28 DIAGNOSIS — D500 Iron deficiency anemia secondary to blood loss (chronic): Secondary | ICD-10-CM | POA: Diagnosis not present

## 2015-07-28 DIAGNOSIS — Q2733 Arteriovenous malformation of digestive system vessel: Secondary | ICD-10-CM | POA: Diagnosis not present

## 2015-07-28 LAB — CBC WITH DIFFERENTIAL/PLATELET
BASOS ABS: 0.1 10*3/uL (ref 0.0–0.1)
BASOS PCT: 1 %
EOS PCT: 2 %
Eosinophils Absolute: 0.1 10*3/uL (ref 0.0–0.7)
HCT: 35.1 % — ABNORMAL LOW (ref 36.0–46.0)
Hemoglobin: 11.2 g/dL — ABNORMAL LOW (ref 12.0–15.0)
Lymphocytes Relative: 24 %
Lymphs Abs: 1.4 10*3/uL (ref 0.7–4.0)
MCH: 27.7 pg (ref 26.0–34.0)
MCHC: 31.9 g/dL (ref 30.0–36.0)
MCV: 86.7 fL (ref 78.0–100.0)
MONO ABS: 0.5 10*3/uL (ref 0.1–1.0)
Monocytes Relative: 9 %
NEUTROS ABS: 3.6 10*3/uL (ref 1.7–7.7)
Neutrophils Relative %: 64 %
PLATELETS: 256 10*3/uL (ref 150–400)
RBC: 4.05 MIL/uL (ref 3.87–5.11)
RDW: 19.4 % — AB (ref 11.5–15.5)
WBC: 5.8 10*3/uL (ref 4.0–10.5)

## 2015-07-28 LAB — IRON AND TIBC
Iron: 49 ug/dL (ref 28–170)
SATURATION RATIOS: 13 % (ref 10.4–31.8)
TIBC: 370 ug/dL (ref 250–450)
UIBC: 321 ug/dL

## 2015-07-28 LAB — FERRITIN: Ferritin: 13 ng/mL (ref 11–307)

## 2015-07-28 NOTE — Progress Notes (Signed)
Carol Lange, MD [place] [address]  Iron deficiency anemia due to chronic blood loss  CURRENT THERAPY: IV iron replacement  Oncology Flowsheet 06/07/2015 06/12/2015  ferric gluconate (NULECIT) IV 125 mg 125 mg    INTERVAL HISTORY: METTA PINGREE 81 y.o. 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 On her prior EGD gastric lesions were seen.  I personally reviewed and went over laboratory results with the patient.  The results are noted within this dictation. Labs will be updated today and will help guide future need for IV iron replacement.  HGB is up 1 g compared to prior to her iron infusions.   She tolerated the infusion well without any complaints.     Past Medical History  Diagnosis Date  . Stroke ([place])   . Hypertension   . Osteoporosis   . COPD (chronic obstructive pulmonary disease) ([place])   . Anemia   . Hard of hearing   . Iron deficiency anemia due to chronic blood loss 05/27/2014    has COPD (chronic obstructive pulmonary disease) ([place]); Osteoporosis; Hyperlipemia; Iron deficiency anemia due to chronic blood loss; Generalized anxiety disorder; and Major depression in remission ([place]) on her problem list.     has No Known Allergies.  Current Outpatient Prescriptions on File Prior to Visit  Medication Sig Dispense Refill  . alendronate (FOSAMAX) 70 MG tablet TAKE 1 TABLET BY MOUTH EVERY 7 DAYS. TAKE WITH A FULL GLASS OF WATER ONK AN EMPTY STOMACH 12 tablet 1  . ALPRAZolam (XANAX) 0.25 MG tablet TAKE 1 TABLET TWICE A DAY AS NEEDED 45 tablet 5  . aspirin 81 MG tablet Take 81 mg by mouth daily.    . Calcium Carb-Cholecalciferol (CALCIUM + D3 PO) Take 1 capsule by mouth daily.     . citalopram (CELEXA) 20 MG tablet TAKE 1 TABLET (20 MG TOTAL) BY MOUTH DAILY. 90 tablet 1  . PROVENTIL HFA 108 (90 BASE) MCG/ACT inhaler INHALE 2 PUFFS INTO THE LUNGS EVERY 6 (SIX) HOURS AS  NEEDED FOR WHEEZING. 20.1 Inhaler 2  . IRON PO Take 65 mg by mouth daily.      No current facility-administered medications on file prior to visit.    Past Surgical History  Procedure Laterality Date  . Appendectomy    . Abdominal hysterectomy    . Tonsillectomy    . Colonoscopy  03/2005    Dr. Gala Romney: rectal and rectosigmoid polyps removed  . Esophagogastroduodenoscopy  03/2005    Dr. Gala Romney: tiney distal esophageal erosions  . Small bowel capsule study  03/2005    Dr. Laural Golden: two small Dieulafoy lesions at ascending colon. tiney specks of blood at duodenal bulb but no identified lesions  . Colonoscopy with esophagogastroduodenoscopy (egd) N/A 08/12/2013    Procedure: COLONOSCOPY WITH ESOPHAGOGASTRODUODENOSCOPY (EGD);  Surgeon: Daneil Dolin, MD;  [facility];  Service: Endoscopy;  Laterality: N/A;  12:30    Denies any headaches, dizziness, double vision, fevers, chills, night sweats, nausea, vomiting, diarrhea, constipation, chest pain, heart palpitations, shortness of breath, blood in stool, black tarry stool, urinary pain, urinary burning, urinary frequency, hematuria.   PHYSICAL EXAMINATION  ECOG PERFORMANCE STATUS: 1 - Symptomatic but completely ambulatory  Filed Vitals:   07/28/15 1305  BP: 153/58  Pulse: 61  Temp: 99.1 F (37.3 C)  Resp: 16    GENERAL:alert, no distress, well nourished, well developed, comfortable,  cooperative, smiling, hard of hearing, accompanied by her son and husband. SKIN: skin color, texture, turgor are normal, no rashes or significant lesions HEAD: Normocephalic, No masses, lesions, tenderness or abnormalities EYES: normal, PERRLA, EOMI, Conjunctiva are pink and non-injected EARS: External ears normal, hard of hearing OROPHARYNX:lips, buccal mucosa, and tongue normal and mucous membranes are moist  NECK: supple, trachea midline BREAST:not examined LUNGS: clear to auscultation  HEART: regular rate & rhythm, no gallops, S1 normal, S2  normal and systolic ejection murmur, 2/6, heard best a LSB 2nd ICS, crescendo in nature. ABDOMEN:abdomen soft and normal bowel sounds BACK: Back symmetric, no curvature., No CVA tenderness EXTREMITIES:less then 2 second capillary refill, no joint deformities, effusion, or inflammation, no edema, no skin discoloration, no clubbing, no cyanosis  NEURO: alert & oriented x 3 with fluent speech, no focal motor/sensory deficits, gait normal    LABORATORY DATA: CBC    Component Value Date/Time   WBC 5.8 07/28/2015 1252   WBC 5.7 05/22/2015 0948   RBC 4.05 07/28/2015 1252   RBC 4.13 05/22/2015 0948   RBC 4.11 10/13/2013 1445   HGB 11.2* 07/28/2015 1252   HGB 10.6* 06/17/2013 1143   HCT 35.1* 07/28/2015 1252   HCT 33.3* 05/22/2015 0948   PLT 256 07/28/2015 1252   MCV 86.7 07/28/2015 1252   MCH 27.7 07/28/2015 1252   MCH 24.7* 05/22/2015 0948   MCHC 31.9 07/28/2015 1252   MCHC 30.6* 05/22/2015 0948   RDW 19.4* 07/28/2015 1252   RDW 17.0* 05/22/2015 0948   LYMPHSABS 1.4 07/28/2015 1252   LYMPHSABS 1.6 05/22/2015 0948   MONOABS 0.5 07/28/2015 1252   EOSABS 0.1 07/28/2015 1252   EOSABS 0.1 12/03/2014 0805   BASOSABS 0.1 07/28/2015 1252   BASOSABS 0.1 05/22/2015 0948      Chemistry      Component Value Date/Time   NA 138 05/22/2015 0948   NA 138 10/13/2013 1445   K 4.2 05/22/2015 0948   CL 97 05/22/2015 0948   CO2 26 05/22/2015 0948   BUN 10 05/22/2015 0948   BUN 13 10/13/2013 1445   CREATININE 0.75 05/22/2015 0948   CREATININE 0.60 06/17/2013 1241      Component Value Date/Time   CALCIUM 10.2 05/22/2015 0948   ALKPHOS 129* 10/13/2013 1445   AST 20 10/13/2013 1445   ALT 14 10/13/2013 1445   BILITOT 0.3 10/13/2013 1445      Lab Results  Component Value Date   IRON 22* 10/13/2013   TIBC 404 10/13/2013   FERRITIN 15 05/22/2015     PENDING LABS:   RADIOGRAPHIC STUDIES:  No results found.   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 On her prior EGD gastric lesions were seen.  H/O medical noncompliance.  Repeat labs in 8 weeks and 16 weeks: CBC diff, iron/TIBC, ferritin  Return in 16 weeks for follow-up.   THERAPY PLAN:  We will update labs today, including iron studies, and re-evaluate the need for future IV iron replacement in the short-term.  Will continue to monitor labs and iron studies.  All questions were answered. The patient knows to call the clinic with any problems, questions or concerns. We can certainly see the patient much sooner if necessary.  Patient and plan discussed with Dr. Ancil Linsey and she is in agreement with the aforementioned.   This note is electronically  signed by: Doy Mince 07/28/2015 1:23 PM

## 2015-07-28 NOTE — Assessment & Plan Note (Signed)
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 On her prior EGD gastric lesions were seen.  H/O medical noncompliance.  Repeat labs in 8 weeks and 16 weeks: CBC diff, iron/TIBC, ferritin  Return in 16 weeks for follow-up.

## 2015-07-28 NOTE — Progress Notes (Signed)
LABS DRAWN

## 2015-07-28 NOTE — Patient Instructions (Signed)
[place] at [facility] Discharge Instructions  RECOMMENDATIONS MADE BY THE CONSULTANT AND ANY TEST RESULTS WILL BE SENT TO YOUR REFERRING PHYSICIAN.  Exam and discussion by Robynn Pane, PA-C Will let you know about your ferritin level next week Call with increased shortness of breath or other concerns  Follow-up: Labs in 2 and 4 months and office visit in 4 months.  Thank you for choosing [place] at [facility] to provide your oncology and hematology care.  To afford each patient quality time with our provider, please arrive at least 15 minutes before your scheduled appointment time.    You need to re-schedule your appointment should you arrive 10 or more minutes late.  We strive to give you quality time with our providers, and arriving late affects you and other patients whose appointments are after yours.  Also, if you no show three or more times for appointments you may be dismissed from the clinic at the providers discretion.     Again, thank you for choosing [facility].  Our hope is that these requests will decrease the amount of time that you wait before being seen by our physicians.       _____________________________________________________________  Should you have questions after your visit to [facility], please contact our office at [phone] between the hours of 8:30 a.m. and 4:30 p.m.  Voicemails left after 4:30 p.m. will not be returned until the following business day.  For prescription refill requests, have your pharmacy contact our office.

## 2015-07-30 ENCOUNTER — Other Ambulatory Visit (HOSPITAL_COMMUNITY): Payer: Self-pay | Admitting: Oncology

## 2015-07-30 DIAGNOSIS — D500 Iron deficiency anemia secondary to blood loss (chronic): Secondary | ICD-10-CM

## 2015-08-10 ENCOUNTER — Encounter (HOSPITAL_BASED_OUTPATIENT_CLINIC_OR_DEPARTMENT_OTHER): Payer: Medicare Other

## 2015-08-10 VITALS — BP 153/45 | HR 64 | Temp 98.1°F | Resp 14

## 2015-08-10 DIAGNOSIS — D500 Iron deficiency anemia secondary to blood loss (chronic): Secondary | ICD-10-CM | POA: Diagnosis present

## 2015-08-10 MED ORDER — SODIUM CHLORIDE 0.9 % IV SOLN
125.0000 mg | Freq: Once | INTRAVENOUS | Status: AC
  Administered 2015-08-10: 125 mg via INTRAVENOUS
  Filled 2015-08-10: qty 10

## 2015-08-10 MED ORDER — SODIUM CHLORIDE 0.9 % IV SOLN
INTRAVENOUS | Status: DC
  Administered 2015-08-10: 14:00:00 via INTRAVENOUS

## 2015-08-10 NOTE — Progress Notes (Signed)
Patient tolerated infusion well.  VSS.   

## 2015-08-10 NOTE — Patient Instructions (Signed)
[place] at [facility] Discharge Instructions  RECOMMENDATIONS MADE BY THE CONSULTANT AND ANY TEST RESULTS WILL BE SENT TO YOUR REFERRING PHYSICIAN.  IV Ferric Gluconate today.   Please return as scheduled.    Thank you for choosing [place] at [facility] to provide your oncology and hematology care.  To afford each patient quality time with our provider, please arrive at least 15 minutes before your scheduled appointment time.    You need to re-schedule your appointment should you arrive 10 or more minutes late.  We strive to give you quality time with our providers, and arriving late affects you and other patients whose appointments are after yours.  Also, if you no show three or more times for appointments you may be dismissed from the clinic at the providers discretion.     Again, thank you for choosing [facility].  Our hope is that these requests will decrease the amount of time that you wait before being seen by our physicians.       _____________________________________________________________  Should you have questions after your visit to [facility], please contact our office at [phone] between the hours of 8:30 a.m. and 4:30 p.m.  Voicemails left after 4:30 p.m. will not be returned until the following business day.  For prescription refill requests, have your pharmacy contact our office.

## 2015-09-25 ENCOUNTER — Other Ambulatory Visit (HOSPITAL_COMMUNITY): Payer: Self-pay | Admitting: *Deleted

## 2015-09-25 DIAGNOSIS — D500 Iron deficiency anemia secondary to blood loss (chronic): Secondary | ICD-10-CM

## 2015-09-29 ENCOUNTER — Other Ambulatory Visit (HOSPITAL_COMMUNITY): Payer: Self-pay | Admitting: Oncology

## 2015-09-29 ENCOUNTER — Encounter (HOSPITAL_COMMUNITY): Payer: Medicare Other | Attending: Oncology

## 2015-09-29 DIAGNOSIS — D500 Iron deficiency anemia secondary to blood loss (chronic): Secondary | ICD-10-CM | POA: Insufficient documentation

## 2015-09-29 LAB — IRON AND TIBC
Iron: 16 ug/dL — ABNORMAL LOW (ref 28–170)
SATURATION RATIOS: 4 % — AB (ref 10.4–31.8)
TIBC: 386 ug/dL (ref 250–450)
UIBC: 370 ug/dL

## 2015-09-29 LAB — CBC WITH DIFFERENTIAL/PLATELET
BASOS ABS: 0.1 10*3/uL (ref 0.0–0.1)
Basophils Relative: 2 %
EOS PCT: 1 %
Eosinophils Absolute: 0.1 10*3/uL (ref 0.0–0.7)
HEMATOCRIT: 32.1 % — AB (ref 36.0–46.0)
HEMOGLOBIN: 10.2 g/dL — AB (ref 12.0–15.0)
LYMPHS ABS: 1.4 10*3/uL (ref 0.7–4.0)
LYMPHS PCT: 27 %
MCH: 27.6 pg (ref 26.0–34.0)
MCHC: 31.8 g/dL (ref 30.0–36.0)
MCV: 86.8 fL (ref 78.0–100.0)
Monocytes Absolute: 0.5 10*3/uL (ref 0.1–1.0)
Monocytes Relative: 9 %
NEUTROS ABS: 3.2 10*3/uL (ref 1.7–7.7)
Neutrophils Relative %: 61 %
PLATELETS: 260 10*3/uL (ref 150–400)
RBC: 3.7 MIL/uL — AB (ref 3.87–5.11)
RDW: 15 % (ref 11.5–15.5)
WBC: 5.2 10*3/uL (ref 4.0–10.5)

## 2015-09-29 LAB — FERRITIN: FERRITIN: 10 ng/mL — AB (ref 11–307)

## 2015-10-06 ENCOUNTER — Encounter (HOSPITAL_BASED_OUTPATIENT_CLINIC_OR_DEPARTMENT_OTHER): Payer: Medicare Other

## 2015-10-06 VITALS — BP 143/48 | HR 61 | Temp 98.2°F | Resp 18

## 2015-10-06 DIAGNOSIS — D500 Iron deficiency anemia secondary to blood loss (chronic): Secondary | ICD-10-CM | POA: Diagnosis not present

## 2015-10-06 MED ORDER — SODIUM CHLORIDE 0.9 % IV SOLN
INTRAVENOUS | Status: DC
  Administered 2015-10-06: 15:00:00 via INTRAVENOUS

## 2015-10-06 MED ORDER — SODIUM CHLORIDE 0.9 % IV SOLN
510.0000 mg | Freq: Once | INTRAVENOUS | Status: AC
  Administered 2015-10-06: 510 mg via INTRAVENOUS
  Filled 2015-10-06: qty 17

## 2015-10-06 NOTE — Progress Notes (Signed)
Patient tolerated infusion well.  VSS.  Labs and schedule printed off for the patient.

## 2015-10-06 NOTE — Patient Instructions (Signed)
[place] at [facility] Discharge Instructions  RECOMMENDATIONS MADE BY THE CONSULTANT AND ANY TEST RESULTS WILL BE SENT TO YOUR REFERRING PHYSICIAN.  IV feraheme today.   Please return as scheduled.    Thank you for choosing [place] at [facility] to provide your oncology and hematology care.  To afford each patient quality time with our provider, please arrive at least 15 minutes before your scheduled appointment time.   Beginning January 23rd 2017 lab work for the [facility] will be done in the  Main lab at [facility] on 1st floor. If you have a lab appointment with the [facility] please come in thru the  Main Entrance and check in at the main information desk  You need to re-schedule your appointment should you arrive 10 or more minutes late.  We strive to give you quality time with our providers, and arriving late affects you and other patients whose appointments are after yours.  Also, if you no show three or more times for appointments you may be dismissed from the clinic at the providers discretion.     Again, thank you for choosing [facility].  Our hope is that these requests will decrease the amount of time that you wait before being seen by our physicians.       _____________________________________________________________  Should you have questions after your visit to [facility], please contact our office at [phone] between the hours of 8:30 a.m. and 4:30 p.m.  Voicemails left after 4:30 p.m. will not be returned until the following business day.  For prescription refill requests, have your pharmacy contact our office.

## 2015-11-06 ENCOUNTER — Encounter: Payer: Self-pay | Admitting: *Deleted

## 2015-11-23 ENCOUNTER — Other Ambulatory Visit (HOSPITAL_COMMUNITY): Payer: Self-pay | Admitting: *Deleted

## 2015-11-23 DIAGNOSIS — D500 Iron deficiency anemia secondary to blood loss (chronic): Secondary | ICD-10-CM

## 2015-11-24 ENCOUNTER — Encounter (HOSPITAL_COMMUNITY): Payer: Self-pay | Admitting: Oncology

## 2015-11-24 ENCOUNTER — Ambulatory Visit (HOSPITAL_COMMUNITY): Payer: Medicare Other | Admitting: Oncology

## 2015-11-24 ENCOUNTER — Encounter (HOSPITAL_COMMUNITY): Payer: Medicare Other

## 2015-11-24 ENCOUNTER — Other Ambulatory Visit (HOSPITAL_COMMUNITY): Payer: Medicare Other

## 2015-11-24 ENCOUNTER — Encounter (HOSPITAL_COMMUNITY): Payer: Medicare Other | Attending: Oncology | Admitting: Oncology

## 2015-11-24 VITALS — BP 162/69 | HR 66 | Temp 98.2°F | Resp 18 | Wt 96.5 lb

## 2015-11-24 DIAGNOSIS — Q2733 Arteriovenous malformation of digestive system vessel: Secondary | ICD-10-CM | POA: Diagnosis not present

## 2015-11-24 DIAGNOSIS — D500 Iron deficiency anemia secondary to blood loss (chronic): Secondary | ICD-10-CM

## 2015-11-24 LAB — CBC WITH DIFFERENTIAL/PLATELET
BASOS ABS: 0.1 10*3/uL (ref 0.0–0.1)
BASOS PCT: 1 %
EOS ABS: 0.2 10*3/uL (ref 0.0–0.7)
EOS PCT: 4 %
HCT: 38.3 % (ref 36.0–46.0)
Hemoglobin: 12.4 g/dL (ref 12.0–15.0)
Lymphocytes Relative: 28 %
Lymphs Abs: 1.8 10*3/uL (ref 0.7–4.0)
MCH: 29.6 pg (ref 26.0–34.0)
MCHC: 32.4 g/dL (ref 30.0–36.0)
MCV: 91.4 fL (ref 78.0–100.0)
MONO ABS: 0.6 10*3/uL (ref 0.1–1.0)
MONOS PCT: 10 %
Neutro Abs: 3.7 10*3/uL (ref 1.7–7.7)
Neutrophils Relative %: 57 %
PLATELETS: 230 10*3/uL (ref 150–400)
RBC: 4.19 MIL/uL (ref 3.87–5.11)
RDW: 19.1 % — AB (ref 11.5–15.5)
WBC: 6.3 10*3/uL (ref 4.0–10.5)

## 2015-11-24 LAB — RENAL FUNCTION PANEL
ANION GAP: 8 (ref 5–15)
Albumin: 4.1 g/dL (ref 3.5–5.0)
BUN: 12 mg/dL (ref 6–20)
CHLORIDE: 101 mmol/L (ref 101–111)
CO2: 29 mmol/L (ref 22–32)
Calcium: 9.9 mg/dL (ref 8.9–10.3)
Creatinine, Ser: 0.7 mg/dL (ref 0.44–1.00)
GFR calc non Af Amer: >60 mL/min (ref >60)
GLUCOSE: 83 mg/dL (ref 65–99)
POTASSIUM: 4.3 mmol/L (ref 3.5–5.1)
Phosphorus: 3.1 mg/dL (ref 2.5–4.6)
Sodium: 138 mmol/L (ref 135–145)

## 2015-11-24 LAB — IRON AND TIBC
IRON: 51 ug/dL (ref 28–170)
SATURATION RATIOS: 14 % (ref 10.4–31.8)
TIBC: 370 ug/dL (ref 250–450)
UIBC: 319 ug/dL

## 2015-11-24 LAB — FERRITIN: Ferritin: 24 ng/mL (ref 11–307)

## 2015-11-24 NOTE — Patient Instructions (Signed)
[place] at [facility] Discharge Instructions  RECOMMENDATIONS MADE BY THE CONSULTANT AND ANY TEST RESULTS WILL BE SENT TO YOUR REFERRING PHYSICIAN.  Labs in 2 months and 4 months  Return in 4 months for appt with the PA  Thank you for choosing [place] at [facility] to provide your oncology and hematology care.  To afford each patient quality time with our provider, please arrive at least 15 minutes before your scheduled appointment time.   Beginning January 23rd 2017 lab work for the [facility] will be done in the  Main lab at [facility] on 1st floor. If you have a lab appointment with the [facility] please come in thru the  Main Entrance and check in at the main information desk  You need to re-schedule your appointment should you arrive 10 or more minutes late.  We strive to give you quality time with our providers, and arriving late affects you and other patients whose appointments are after yours.  Also, if you no show three or more times for appointments you may be dismissed from the clinic at the providers discretion.     Again, thank you for choosing [facility].  Our hope is that these requests will decrease the amount of time that you wait before being seen by our physicians.       _____________________________________________________________  Should you have questions after your visit to [facility], please contact our office at [phone] between the hours of 8:30 a.m. and 4:30 p.m.  Voicemails left after 4:30 p.m. will not be returned until the following business day.  For prescription refill requests, have your pharmacy contact our office.         Resources For Cancer Patients and their Caregivers ? American Cancer Society: Can assist with transportation, wigs, general needs, runs Look Good Feel Better.        831-154-5090 ? Cancer Care: Provides financial assistance, online  support groups, medication/co-pay assistance.  1-800-813-HOPE 267-468-8138) ? [place] Assists [place] cancer patients and their families through emotional , educational and financial support.  360-703-4964 ? [place] DSS Where to apply for food stamps, Medicaid and utility assistance. 7804500790 ? [facility]: Transportation to medical appointments. 256-320-7810 ? Social Security Administration: May apply for disability if have a Stage IV cancer. (317)332-2074 905-418-7637 ? [employer], Disability and Transit Services: Assists with nutrition, care and transit needs. [phone] [phone]

## 2015-11-24 NOTE — Progress Notes (Signed)
Carol Lange, MD [place] [address]  Iron deficiency anemia due to chronic blood loss - Plan: Renal function panel, CBC with Differential, Iron and TIBC, Ferritin, CBC with Differential, Iron and TIBC, Ferritin, Renal function panel  CURRENT THERAPY: IV iron replacement as indicated  INTERVAL HISTORY: Carol Massey 82 y.o. 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 On her prior EGD gastric lesions were seen.  I personally reviewed and went over laboratory results with the patient.  The results are noted within this dictation. Labs will be updated today.  HGB is WNL.  Iron studies are pending at this time.  She has demonstrated an excellent response to iron replacement therapy.  She tolerated iron infusions well without any complaints in the past.   She denies any complaints today, but initially worried that we were going to given her iron replacement therapy today, which we are not.  Past Medical History  Diagnosis Date  . Stroke ([place])   . Hypertension   . Osteoporosis   . COPD (chronic obstructive pulmonary disease) ([place])   . Anemia   . Hard of hearing   . Iron deficiency anemia due to chronic blood loss 05/27/2014    has COPD (chronic obstructive pulmonary disease) ([place]); Osteoporosis; Hyperlipemia; Iron deficiency anemia due to chronic blood loss; Generalized anxiety disorder; and Major depression in remission ([place]) on her problem list.     has No Known Allergies.  Current Outpatient Prescriptions on File Prior to Visit  Medication Sig Dispense Refill  . alendronate (FOSAMAX) 70 MG tablet TAKE 1 TABLET BY MOUTH EVERY 7 DAYS. TAKE WITH A FULL GLASS OF WATER ONK AN EMPTY STOMACH 12 tablet 1  . ALPRAZolam (XANAX) 0.25 MG tablet TAKE 1 TABLET TWICE A DAY AS NEEDED 45 tablet 5  . ASCORBIC ACID PO Take 1 capsule by mouth daily.    [name] aspirin 81 MG tablet Take 325 mg by mouth  daily.     . Calcium Carb-Cholecalciferol (CALCIUM + D3 PO) Take 1 capsule by mouth daily.     . citalopram (CELEXA) 20 MG tablet TAKE 1 TABLET (20 MG TOTAL) BY MOUTH DAILY. 90 tablet 1  . PROVENTIL HFA 108 (90 BASE) MCG/ACT inhaler INHALE 2 PUFFS INTO THE LUNGS EVERY 6 (SIX) HOURS AS NEEDED FOR WHEEZING. 20.1 Inhaler 2  . IRON PO Take 65 mg by mouth daily. Reported on 11/24/2015     No current facility-administered medications on file prior to visit.    Past Surgical History  Procedure Laterality Date  . Appendectomy    . Abdominal hysterectomy    . Tonsillectomy    . Colonoscopy  03/2005    Dr. Gala Romney: rectal and rectosigmoid polyps removed  . Esophagogastroduodenoscopy  03/2005    Dr. Gala Romney: tiney distal esophageal erosions  . Small bowel capsule study  03/2005    Dr. Laural Golden: two small Dieulafoy lesions at ascending colon. tiney specks of blood at duodenal bulb but no identified lesions  . Colonoscopy with esophagogastroduodenoscopy (egd) N/A 08/12/2013    Procedure: COLONOSCOPY WITH ESOPHAGOGASTRODUODENOSCOPY (EGD);  Surgeon: Daneil Dolin, MD;  [facility];  Service: Endoscopy;  Laterality: N/A;  12:30    Denies any headaches, dizziness, double vision, fevers, chills, night sweats, nausea, vomiting, diarrhea, constipation, chest pain, heart palpitations, shortness of breath, blood in stool, black tarry stool, urinary pain, urinary burning, urinary frequency,  hematuria.   PHYSICAL EXAMINATION  ECOG PERFORMANCE STATUS: 1 - Symptomatic but completely ambulatory  Filed Vitals:   11/24/15 1001  BP: 162/69  Pulse: 66  Temp: 98.2 F (36.8 C)  Resp: 18    GENERAL:alert, no distress, well nourished, well developed, comfortable, cooperative, smiling, hard of hearing, accompanied by her son and husband. SKIN: skin color, texture, turgor are normal, no rashes or significant lesions HEAD: Normocephalic, No masses, lesions, tenderness or abnormalities EYES: normal, PERRLA,  EOMI, Conjunctiva are pink and non-injected EARS: External ears normal, hard of hearing OROPHARYNX:lips, buccal mucosa, and tongue normal and mucous membranes are moist  NECK: supple, trachea midline BREAST:not examined LUNGS: clear to auscultation  HEART: regular rate & rhythm, no gallops, S1 normal, S2 normal and systolic ejection murmur, 2/6, heard best a LSB 2nd ICS, crescendo in nature. ABDOMEN:abdomen soft and normal bowel sounds BACK: Back symmetric, no curvature., No CVA tenderness EXTREMITIES:less then 2 second capillary refill, no joint deformities, effusion, or inflammation, no edema, no skin discoloration, no clubbing, no cyanosis  NEURO: alert & oriented x 3 with fluent speech, no focal motor/sensory deficits, gait normal    LABORATORY DATA: CBC    Component Value Date/Time   WBC 6.3 11/24/2015 0906   WBC 5.7 05/22/2015 0948   RBC 4.19 11/24/2015 0906   RBC 4.13 05/22/2015 0948   RBC 4.11 10/13/2013 1445   HGB 12.4 11/24/2015 0906   HGB 10.6* 06/17/2013 1143   HCT 38.3 11/24/2015 0906   HCT 33.3* 05/22/2015 0948   PLT 230 11/24/2015 0906   PLT 272 05/22/2015 0948   MCV 91.4 11/24/2015 0906   MCV 81 05/22/2015 0948   MCH 29.6 11/24/2015 0906   MCH 24.7* 05/22/2015 0948   MCHC 32.4 11/24/2015 0906   MCHC 30.6* 05/22/2015 0948   RDW 19.1* 11/24/2015 0906   RDW 17.0* 05/22/2015 0948   LYMPHSABS 1.8 11/24/2015 0906   LYMPHSABS 1.6 05/22/2015 0948   MONOABS 0.6 11/24/2015 0906   EOSABS 0.2 11/24/2015 0906   EOSABS 0.2 05/22/2015 0948   EOSABS 0.1 12/03/2014 0805   BASOSABS 0.1 11/24/2015 0906   BASOSABS 0.1 05/22/2015 0948      Chemistry      Component Value Date/Time   NA 138 11/24/2015 0907   NA 138 05/22/2015 0948   K 4.3 11/24/2015 0907   CL 101 11/24/2015 0907   CO2 29 11/24/2015 0907   BUN 12 11/24/2015 0907   BUN 10 05/22/2015 0948   CREATININE 0.70 11/24/2015 0907   CREATININE 0.60 06/17/2013 1241      Component Value Date/Time   CALCIUM 9.9  11/24/2015 0907   ALKPHOS 129* 10/13/2013 1445   AST 20 10/13/2013 1445   ALT 14 10/13/2013 1445   BILITOT 0.3 10/13/2013 1445      Lab Results  Component Value Date   IRON 16* 09/29/2015   TIBC 386 09/29/2015   FERRITIN 10* 09/29/2015     PENDING LABS:   RADIOGRAPHIC STUDIES:  No results found.   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 On her prior EGD gastric lesions were seen.  Oncology Flowsheet 06/07/2015 06/12/2015 08/10/2015 10/06/2015  ferric gluconate (NULECIT) IV 125 mg 125 mg 125 mg   ferumoxytol (FERAHEME) IV    510 mg    H/O medical noncompliance.  She does not have documented chronic renal disease  documented and therefore, we cannot use ferric gluconate anymore due to insurance conflicts.  Repeat labs in 8 weeks and 16 weeks: CBC diff, iron/TIBC, ferritin  I have added a renal function panel to today's lab work and lab work in the future.  Return in 16 weeks for follow-up.   THERAPY PLAN:  Will continue to monitor labs and iron studies and replace iron as indicated.  All questions were answered. The patient knows to call the clinic with any problems, questions or concerns. We can certainly see the patient much sooner if necessary.  Patient and plan discussed with Dr. Ancil Linsey and she is in agreement with the aforementioned.   This note is electronically signed by: Doy Mince 11/24/2015 11:25 AM

## 2015-11-24 NOTE — Assessment & Plan Note (Addendum)
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 On her prior EGD gastric lesions were seen.  Oncology Flowsheet 06/07/2015 06/12/2015 08/10/2015 10/06/2015  ferric gluconate (NULECIT) IV 125 mg 125 mg 125 mg   ferumoxytol (FERAHEME) IV    510 mg    H/O medical noncompliance.  She does not have documented chronic renal disease documented and therefore, we cannot use ferric gluconate anymore due to insurance conflicts.  Repeat labs in 8 weeks and 16 weeks: CBC diff, iron/TIBC, ferritin  I have added a renal function panel to today's lab work and lab work in the future.  Return in 16 weeks for follow-up.

## 2015-11-27 ENCOUNTER — Encounter (HOSPITAL_COMMUNITY)
Admission: EM | Disposition: A | Discharge status: Home / Self Care | Payer: Self-pay | Source: Home / Self Care | Attending: Internal Medicine

## 2015-11-27 ENCOUNTER — Emergency Department (HOSPITAL_COMMUNITY): Payer: Medicare Other

## 2015-11-27 ENCOUNTER — Observation Stay (HOSPITAL_COMMUNITY)
Admission: EM | Admit: 2015-11-27 | Discharge: 2015-11-28 | Disposition: A | Discharge status: Home / Self Care | Payer: Medicare Other | Attending: Internal Medicine | Admitting: Internal Medicine

## 2015-11-27 ENCOUNTER — Encounter (HOSPITAL_COMMUNITY): Payer: Self-pay | Admitting: Anesthesiology

## 2015-11-27 ENCOUNTER — Other Ambulatory Visit: Payer: Self-pay

## 2015-11-27 ENCOUNTER — Encounter (HOSPITAL_COMMUNITY): Payer: Self-pay | Admitting: Emergency Medicine

## 2015-11-27 DIAGNOSIS — J449 Chronic obstructive pulmonary disease, unspecified: Secondary | ICD-10-CM | POA: Insufficient documentation

## 2015-11-27 DIAGNOSIS — D500 Iron deficiency anemia secondary to blood loss (chronic): Secondary | ICD-10-CM | POA: Diagnosis not present

## 2015-11-27 DIAGNOSIS — F329 Major depressive disorder, single episode, unspecified: Secondary | ICD-10-CM | POA: Diagnosis not present

## 2015-11-27 DIAGNOSIS — K279 Peptic ulcer, site unspecified, unspecified as acute or chronic, without hemorrhage or perforation: Secondary | ICD-10-CM | POA: Diagnosis present

## 2015-11-27 DIAGNOSIS — R079 Chest pain, unspecified: Secondary | ICD-10-CM | POA: Insufficient documentation

## 2015-11-27 DIAGNOSIS — M810 Age-related osteoporosis without current pathological fracture: Secondary | ICD-10-CM | POA: Diagnosis not present

## 2015-11-27 DIAGNOSIS — K274 Chronic or unspecified peptic ulcer, site unspecified, with hemorrhage: Principal | ICD-10-CM | POA: Insufficient documentation

## 2015-11-27 DIAGNOSIS — K259 Gastric ulcer, unspecified as acute or chronic, without hemorrhage or perforation: Secondary | ICD-10-CM | POA: Diagnosis not present

## 2015-11-27 DIAGNOSIS — K449 Diaphragmatic hernia without obstruction or gangrene: Secondary | ICD-10-CM | POA: Diagnosis not present

## 2015-11-27 DIAGNOSIS — K922 Gastrointestinal hemorrhage, unspecified: Secondary | ICD-10-CM | POA: Diagnosis present

## 2015-11-27 DIAGNOSIS — F17200 Nicotine dependence, unspecified, uncomplicated: Secondary | ICD-10-CM | POA: Diagnosis not present

## 2015-11-27 DIAGNOSIS — F325 Major depressive disorder, single episode, in full remission: Secondary | ICD-10-CM | POA: Diagnosis present

## 2015-11-27 DIAGNOSIS — I10 Essential (primary) hypertension: Secondary | ICD-10-CM | POA: Insufficient documentation

## 2015-11-27 DIAGNOSIS — Z8673 Personal history of transient ischemic attack (TIA), and cerebral infarction without residual deficits: Secondary | ICD-10-CM | POA: Diagnosis not present

## 2015-11-27 DIAGNOSIS — F411 Generalized anxiety disorder: Secondary | ICD-10-CM | POA: Diagnosis present

## 2015-11-27 DIAGNOSIS — K921 Melena: Secondary | ICD-10-CM | POA: Diagnosis not present

## 2015-11-27 DIAGNOSIS — E785 Hyperlipidemia, unspecified: Secondary | ICD-10-CM | POA: Diagnosis present

## 2015-11-27 DIAGNOSIS — R7989 Other specified abnormal findings of blood chemistry: Secondary | ICD-10-CM | POA: Diagnosis not present

## 2015-11-27 DIAGNOSIS — Z79899 Other long term (current) drug therapy: Secondary | ICD-10-CM | POA: Diagnosis not present

## 2015-11-27 DIAGNOSIS — Z90710 Acquired absence of both cervix and uterus: Secondary | ICD-10-CM | POA: Diagnosis not present

## 2015-11-27 DIAGNOSIS — D62 Acute posthemorrhagic anemia: Secondary | ICD-10-CM | POA: Insufficient documentation

## 2015-11-27 DIAGNOSIS — Z720 Tobacco use: Secondary | ICD-10-CM | POA: Diagnosis present

## 2015-11-27 DIAGNOSIS — R1310 Dysphagia, unspecified: Secondary | ICD-10-CM | POA: Diagnosis not present

## 2015-11-27 DIAGNOSIS — M25511 Pain in right shoulder: Secondary | ICD-10-CM | POA: Diagnosis not present

## 2015-11-27 HISTORY — DX: Dieulafoy lesion of intestine: K63.81

## 2015-11-27 HISTORY — DX: Peptic ulcer, site unspecified, unspecified as acute or chronic, without hemorrhage or perforation: K27.9

## 2015-11-27 HISTORY — DX: Gastrointestinal hemorrhage, unspecified: K92.2

## 2015-11-27 HISTORY — DX: Angiodysplasia of stomach and duodenum without bleeding: K31.819

## 2015-11-27 HISTORY — PX: GIVENS CAPSULE STUDY: SHX5432

## 2015-11-27 HISTORY — DX: Generalized anxiety disorder: F41.1

## 2015-11-27 HISTORY — PX: ESOPHAGEAL DILATION: SHX303

## 2015-11-27 HISTORY — PX: ESOPHAGOGASTRODUODENOSCOPY: SHX5428

## 2015-11-27 HISTORY — DX: Major depressive disorder, single episode, in full remission: F32.5

## 2015-11-27 LAB — CBC WITH DIFFERENTIAL/PLATELET
BASOS PCT: 1 %
Basophils Absolute: 0.1 10*3/uL (ref 0.0–0.1)
EOS ABS: 0.1 10*3/uL (ref 0.0–0.7)
Eosinophils Relative: 1 %
HCT: 24.6 % — ABNORMAL LOW (ref 36.0–46.0)
Hemoglobin: 8.1 g/dL — ABNORMAL LOW (ref 12.0–15.0)
LYMPHS ABS: 1.2 10*3/uL (ref 0.7–4.0)
Lymphocytes Relative: 21 %
MCH: 29.9 pg (ref 26.0–34.0)
MCHC: 32.9 g/dL (ref 30.0–36.0)
MCV: 90.8 fL (ref 78.0–100.0)
Monocytes Absolute: 0.4 10*3/uL (ref 0.1–1.0)
Monocytes Relative: 8 %
NEUTROS ABS: 3.8 10*3/uL (ref 1.7–7.7)
NEUTROS PCT: 69 %
Platelets: 185 10*3/uL (ref 150–400)
RBC: 2.71 MIL/uL — AB (ref 3.87–5.11)
RDW: 19 % — ABNORMAL HIGH (ref 11.5–15.5)
WBC: 5.6 10*3/uL (ref 4.0–10.5)

## 2015-11-27 LAB — URINALYSIS, ROUTINE W REFLEX MICROSCOPIC
BILIRUBIN URINE: NEGATIVE
GLUCOSE, UA: NEGATIVE mg/dL
KETONES UR: NEGATIVE mg/dL
LEUKOCYTES UA: NEGATIVE
Nitrite: NEGATIVE
PH: 5 (ref 5.0–8.0)
Protein, ur: NEGATIVE mg/dL
SPECIFIC GRAVITY, URINE: 1.015 (ref 1.005–1.030)

## 2015-11-27 LAB — COMPREHENSIVE METABOLIC PANEL
ALBUMIN: 3.2 g/dL — AB (ref 3.5–5.0)
ALK PHOS: 54 U/L (ref 38–126)
ALT: 12 U/L — ABNORMAL LOW (ref 14–54)
ANION GAP: 5 (ref 5–15)
AST: 17 U/L (ref 15–41)
BUN: 44 mg/dL — AB (ref 6–20)
CO2: 26 mmol/L (ref 22–32)
CREATININE: 0.67 mg/dL (ref 0.44–1.00)
Calcium: 8.3 mg/dL — ABNORMAL LOW (ref 8.9–10.3)
Chloride: 107 mmol/L (ref 101–111)
GFR calc Af Amer: >60 mL/min (ref >60)
GFR calc non Af Amer: >60 mL/min (ref >60)
GLUCOSE: 110 mg/dL — AB (ref 65–99)
POTASSIUM: 4.4 mmol/L (ref 3.5–5.1)
Sodium: 138 mmol/L (ref 135–145)
TOTAL PROTEIN: 5.4 g/dL — AB (ref 6.5–8.1)
Total Bilirubin: 0.4 mg/dL (ref 0.3–1.2)

## 2015-11-27 LAB — URINE MICROSCOPIC-ADD ON
SQUAMOUS EPITHELIAL / LPF: NONE SEEN
WBC, UA: NONE SEEN WBC/hpf (ref 0–5)

## 2015-11-27 LAB — PROTIME-INR
INR: 1.14 (ref 0.00–1.49)
PROTHROMBIN TIME: 14.7 s (ref 11.6–15.2)

## 2015-11-27 LAB — LIPASE, BLOOD: Lipase: 24 U/L (ref 11–51)

## 2015-11-27 LAB — I-STAT CG4 LACTIC ACID, ED: Lactic Acid, Venous: 1.07 mmol/L (ref 0.5–2.0)

## 2015-11-27 LAB — POC OCCULT BLOOD, ED: Fecal Occult Bld: POSITIVE — AB

## 2015-11-27 LAB — TROPONIN I

## 2015-11-27 LAB — PREPARE RBC (CROSSMATCH)

## 2015-11-27 SURGERY — EGD (ESOPHAGOGASTRODUODENOSCOPY)
Anesthesia: Moderate Sedation

## 2015-11-27 MED ORDER — SODIUM CHLORIDE 0.9 % IV SOLN
INTRAVENOUS | Status: AC
  Administered 2015-11-27: 10:00:00 via INTRAVENOUS

## 2015-11-27 MED ORDER — PANTOPRAZOLE SODIUM 40 MG IV SOLR: 40.0000 mg | Freq: Two times a day (BID) | INTRAVENOUS | Status: DC

## 2015-11-27 MED ORDER — SODIUM CHLORIDE 0.9 % IV BOLUS (SEPSIS)
1000.0000 mL | Freq: Once | INTRAVENOUS | Status: AC
  Administered 2015-11-27: 1000 mL via INTRAVENOUS

## 2015-11-27 MED ORDER — PANTOPRAZOLE SODIUM 40 MG IV SOLR
40.0000 mg | Freq: Two times a day (BID) | INTRAVENOUS | Status: DC
  Administered 2015-11-27: 40 mg via INTRAVENOUS
  Filled 2015-11-27 (×2): qty 40

## 2015-11-27 MED ORDER — ONDANSETRON HCL 4 MG/2ML IJ SOLN
INTRAMUSCULAR | Status: DC | PRN
  Administered 2015-11-27: 4 mg via INTRAVENOUS

## 2015-11-27 MED ORDER — POTASSIUM CHLORIDE IN NACL 20-0.9 MEQ/L-% IV SOLN
INTRAVENOUS | Status: DC
  Administered 2015-11-27: 21:00:00 via INTRAVENOUS

## 2015-11-27 MED ORDER — ACETAMINOPHEN 325 MG PO TABS: 650.0000 mg | ORAL_TABLET | Freq: Four times a day (QID) | ORAL | Status: DC | PRN

## 2015-11-27 MED ORDER — LIDOCAINE VISCOUS 2 % MT SOLN
OROMUCOSAL | Status: AC
  Filled 2015-11-27: qty 15

## 2015-11-27 MED ORDER — MORPHINE SULFATE (PF) 2 MG/ML IV SOLN: 2.0000 mg | INTRAVENOUS | Status: DC | PRN

## 2015-11-27 MED ORDER — SODIUM CHLORIDE 0.9 % IV SOLN
80.0000 mg | Freq: Once | INTRAVENOUS | Status: AC
  Administered 2015-11-27: 80 mg via INTRAVENOUS
  Filled 2015-11-27: qty 80

## 2015-11-27 MED ORDER — MEPERIDINE HCL 100 MG/ML IJ SOLN
INTRAMUSCULAR | Status: AC
  Filled 2015-11-27: qty 2

## 2015-11-27 MED ORDER — ONDANSETRON HCL 4 MG/2ML IJ SOLN
INTRAMUSCULAR | Status: AC
  Filled 2015-11-27: qty 2

## 2015-11-27 MED ORDER — ONDANSETRON HCL 4 MG/2ML IJ SOLN
4.0000 mg | Freq: Once | INTRAMUSCULAR | Status: AC
  Administered 2015-11-27: 4 mg via INTRAVENOUS
  Filled 2015-11-27: qty 2

## 2015-11-27 MED ORDER — LIDOCAINE VISCOUS 2 % MT SOLN
OROMUCOSAL | Status: DC | PRN
  Administered 2015-11-27: 1 via OROMUCOSAL

## 2015-11-27 MED ORDER — ACETAMINOPHEN 650 MG RE SUPP: 650.0000 mg | Freq: Four times a day (QID) | RECTAL | Status: DC | PRN

## 2015-11-27 MED ORDER — MIDAZOLAM HCL 5 MG/5ML IJ SOLN
INTRAMUSCULAR | Status: DC | PRN
  Administered 2015-11-27: 2 mg via INTRAVENOUS

## 2015-11-27 MED ORDER — ALBUTEROL SULFATE (2.5 MG/3ML) 0.083% IN NEBU: 2.5000 mg | INHALATION_SOLUTION | RESPIRATORY_TRACT | Status: DC | PRN

## 2015-11-27 MED ORDER — CITALOPRAM HYDROBROMIDE 20 MG PO TABS
20.0000 mg | ORAL_TABLET | Freq: Every day | ORAL | Status: DC
  Administered 2015-11-27 – 2015-11-28 (×2): 20 mg via ORAL
  Filled 2015-11-27 (×2): qty 1

## 2015-11-27 MED ORDER — ONDANSETRON HCL 4 MG PO TABS
4.0000 mg | ORAL_TABLET | Freq: Four times a day (QID) | ORAL | Status: DC
  Administered 2015-11-27 – 2015-11-28 (×2): 4 mg via ORAL
  Filled 2015-11-27 (×2): qty 1

## 2015-11-27 MED ORDER — MEPERIDINE HCL 100 MG/ML IJ SOLN
INTRAMUSCULAR | Status: DC | PRN
  Administered 2015-11-27: 25 mg via INTRAVENOUS

## 2015-11-27 MED ORDER — ONDANSETRON HCL 4 MG/2ML IJ SOLN
4.0000 mg | Freq: Four times a day (QID) | INTRAMUSCULAR | Status: DC
  Administered 2015-11-28: 4 mg via INTRAVENOUS
  Filled 2015-11-27: qty 2

## 2015-11-27 MED ORDER — MIDAZOLAM HCL 5 MG/5ML IJ SOLN
INTRAMUSCULAR | Status: AC
  Filled 2015-11-27: qty 10

## 2015-11-27 MED ORDER — HYDROCODONE-ACETAMINOPHEN 5-325 MG PO TABS
1.0000 | ORAL_TABLET | ORAL | Status: DC | PRN
  Administered 2015-11-28: 1 via ORAL
  Filled 2015-11-27: qty 1

## 2015-11-27 MED ORDER — SODIUM CHLORIDE 0.9 % IV SOLN
Freq: Once | INTRAVENOUS | Status: AC
  Administered 2015-11-27: 10:00:00 via INTRAVENOUS

## 2015-11-27 MED ORDER — ALPRAZOLAM 0.25 MG PO TABS: 0.2500 mg | ORAL_TABLET | Freq: Two times a day (BID) | ORAL | Status: DC | PRN

## 2015-11-27 NOTE — ED Notes (Signed)
Pt c/o n/v/d and dizziness x 2 days. Pt also reports right shoulder pain after hitting x 2 weeks ago.

## 2015-11-27 NOTE — H&P (Signed)
[facility] History and Physical  Carol Massey [id number] DOB: Dec 17, 1925 DOA: 11/27/2015  Referring physician: ED physician, Dr. Wyvonnia Dusky PCP: Sallee Lange, MD   Chief Complaint: Dizziness, nausea and vomiting, and black stools.  HPI: Carol Massey is a 82 y.o. female with a history of iron deficiency anemia, presumed to be secondary to chronic blood loss-on outpatient intravenous iron therapy by hematology, gastric AVM in 2004 per EGD, and capsule study that revealed 2 Dieulafoy lesions at the ascending colon and specks of blood at the duodenum in 2006. She presents to the ED with a chief complaint of lightheadedness/dizziness. Over the past 24 hours, she has noticed that her stools have been black and loose. She denies bright red blood in her stools. She has had several episodes of nausea and vomiting over the past several days. She denies coffee grounds emesis or seeing blood in her emesis. She denies abdominal pain. She usually takes an 81 mg aspirin tablet daily, but over the past few days, she has taken three 325 mg aspirin tablets for right shoulder and right chest pain after falling a couple weeks ago. She still has some residual right upper chest wall pain. She denies shortness of breath or swelling in her legs. She denies pain with urination. She was seen at the hematologist office on 11/24/15. At that time, her hemoglobin was noted to be within normal limits at 12.4.  In the ED, she was afebrile and hemodynamically stable. Her lab data are significant for hemoglobin of 8.1, BUN of 44, normal troponin I, and glucose of 110. Chest x-ray and rib films revealed COPD, no active disease, and no visible rib fracture or pneumothorax. Right shoulder x-ray revealed no acute abnormality. She is being admitted for further evaluation and management.     Review of Systems:  As above in history present illness, otherwise negative.  Past Medical History  Diagnosis Date  . Stroke ([place])   .  Hypertension   . Osteoporosis   . COPD (chronic obstructive pulmonary disease) ([place])   . Anemia   . Hard of hearing   . Iron deficiency anemia due to chronic blood loss 05/27/2014  . Gastric AVM 2014  . Dieulafoy lesion of colon 2006  . Major depression in remission ([place]) 07/24/2015  . Generalized anxiety disorder 07/24/2015   Past Surgical History  Procedure Laterality Date  . Appendectomy    . Abdominal hysterectomy    . Tonsillectomy    . Colonoscopy  03/2005    Dr. Gala Romney: rectal and rectosigmoid polyps removed  . Esophagogastroduodenoscopy  03/2005    Dr. Gala Romney: tiny distal esophageal erosions  . Small bowel capsule study  03/2005    Dr. Laural Golden: two small Dieulafoy lesions at ascending colon. tiney specks of blood at duodenal bulb but no identified lesions  . Colonoscopy with esophagogastroduodenoscopy (egd) N/A 08/12/2013    Dr. Gala Romney: colonoscopy with tubular adenoma, colonic diverticulosis. EGD with non-critical Schatzki's ring, non-manipulated, and gastric AVM with evidence of recent bleeding, s/p thermal sealing and hemostasis clip   Social History: She reports that she has been smoking Cigarettes. She smokes a half a pack of cigarettes daily. She has a 35 pack-year smoking history. She has never used smokeless tobacco. She reports that she does not drink alcohol or use illicit drugs. she lives with her husband and son in [place].  No Known Allergies  Family History  Problem Relation Age of Onset  . Hypertension Mother   . Leukemia Father 67  .  Stroke Mother   . Colon cancer Neg Hx     Prior to Admission medications   Medication Sig Start Date End Date Taking? Authorizing Provider  alendronate (FOSAMAX) 70 MG tablet TAKE 1 TABLET BY MOUTH EVERY 7 DAYS. TAKE WITH A FULL GLASS OF WATER ONK AN EMPTY STOMACH 06/26/15  Yes [name], MD  ALPRAZolam (XANAX) 0.25 MG tablet TAKE 1 TABLET TWICE A DAY AS NEEDED 06/08/15  Yes [name], MD  ASCORBIC ACID PO Take 1  capsule by mouth daily.   Yes Historical Provider, MD  aspirin 81 MG tablet Take 325 mg by mouth daily.    Yes Historical Provider, MD  Calcium Carb-Cholecalciferol (CALCIUM + D3 PO) Take 1 capsule by mouth daily.    Yes Historical Provider, MD  citalopram (CELEXA) 20 MG tablet TAKE 1 TABLET (20 MG TOTAL) BY MOUTH DAILY. 06/08/15  Yes [name], MD  PROVENTIL HFA 108 (90 BASE) MCG/ACT inhaler INHALE 2 PUFFS INTO THE LUNGS EVERY 6 (SIX) HOURS AS NEEDED FOR WHEEZING. 04/24/15  Yes Mikey Kirschner, MD   Physical Exam: Filed Vitals:   11/27/15 0800 11/27/15 1000 11/27/15 1030 11/27/15 1106  BP: 114/68 106/62 106/62 114/43  Pulse: 77 80 79 79  Temp:  98.2 F (36.8 C) 98.2 F (36.8 C) 98.5 F (36.9 C)  TempSrc:  Oral Oral Oral  Resp: 20 20 20 20   Height:    4\' 9"  (1.448 m)  Weight:    43.3 kg (95 lb 7.4 oz)  SpO2: 97% 96% 96% 96%    Wt Readings from Last 3 Encounters:  11/27/15 43.3 kg (95 lb 7.4 oz)  11/24/15 43.772 kg (96 lb 8 oz)  07/28/15 43.228 kg (95 lb 4.8 oz)    General:  Appears calm and comfortable; Pleasant elderly 82 year old woman in no acute distress. Eyes: PERRL, normal lids, irises & conjunctiva; conjunctivae are clear and sclerae are white. ENT: grossly normal hearing; oropharynx reveals dry mucous membranes, no teeth. Neck: no LAD, masses or thyromegaly Cardiovascular: S1, S2, with a soft systolic murmur. Telemetry: SR, no arrhythmias  Respiratory: CTA bilaterally, no w/r/r. Normal respiratory effort. Abdomen: Positive bowel sounds, soft, nontender, nondistended. Rectal deferred, but EDP reports black tarry stool on exam. Skin: no rash or induration seen on limited exam Musculoskeletal: grossly normal tone BUE/BLE. Mild upper right chest wall pain over the upper pectoralis; mild tenderness over the right shoulder with good range of motion. Psychiatric: grossly normal mood and affect, speech fluent and appropriate Neurologic: grossly non-focal; hard of hearing;  otherwise cranial nerves II through XII are intact.           Labs on Admission:  Basic Metabolic Panel:  Recent Labs Lab 11/24/15 0907 11/27/15 0734  NA 138 138  K 4.3 4.4  CL 101 107  CO2 29 26  GLUCOSE 83 110*  BUN 12 44*  CREATININE 0.70 0.67  CALCIUM 9.9 8.3*  PHOS 3.1  --    Liver Function Tests:  Recent Labs Lab 11/24/15 0907 11/27/15 0734  AST  --  17  ALT  --  12*  ALKPHOS  --  54  BILITOT  --  0.4  PROT  --  5.4*  ALBUMIN 4.1 3.2*    Recent Labs Lab 11/27/15 0734  LIPASE 24   No results for input(s): AMMONIA in the last 168 hours. CBC:  Recent Labs Lab 11/24/15 0906 11/27/15 0734  WBC 6.3 5.6  NEUTROABS 3.7 3.8  HGB 12.4  8.1*  HCT 38.3 24.6*  MCV 91.4 90.8  PLT 230 185   Cardiac Enzymes:  Recent Labs Lab 11/27/15 0734  TROPONINI <0.03    BNP (last 3 results) No results for input(s): BNP in the last 8760 hours.  ProBNP (last 3 results) No results for input(s): PROBNP in the last 8760 hours.  CBG: No results for input(s): GLUCAP in the last 168 hours.  Radiological Exams on Admission: Dg Ribs Unilateral W/chest Right  11/27/2015  CLINICAL DATA:  Right side rib pain. [address] and hit right side on door frame 2 weeks ago. EXAM: RIGHT RIBS AND CHEST - 3+ VIEW COMPARISON:  11/09/2013 FINDINGS: No visible rib fracture or pneumothorax. No effusion. There is hyperinflation of the lungs compatible with COPD. No confluent opacity. Heart is borderline in size. IMPRESSION: COPD. No active disease. No visible rib fracture or pneumothorax. Electronically Signed   By: Rolm Baptise M.D.   On: 11/27/2015 08:30   Dg Shoulder Right  11/27/2015  CLINICAL DATA:  Fall 2 weeks ago.  Right shoulder and rib pain. EXAM: RIGHT SHOULDER - 2+ VIEW COMPARISON:  None. FINDINGS: Mild degenerative changes in the right shoulder with joint space narrowing and spurring. No acute bony abnormality. Specifically, no fracture, subluxation, or dislocation. Soft tissues are  intact. IMPRESSION: No acute bony abnormality. Electronically Signed   By: Rolm Baptise M.D.   On: 11/27/2015 08:33    EKG: Independently reviewed.   Assessment/Plan Principal Problem:   Melena Active Problems:   Iron deficiency anemia due to chronic blood loss   Upper GI bleed   Azotemia   COPD (chronic obstructive pulmonary disease) ([facility])   Hyperlipemia   Generalized anxiety disorder   Major depression in remission ([facility])   Tobacco abuse   Right-sided chest pain   1. Melena, presumed to be secondary to upper GI bleed. Patient has a history of gastric AVM in 2014 with evidence of bleeding, status post thermal ceiling and hemostasis clip. She recently increased the dose of aspirin to 325 mg daily for several days due to right shoulder and right upper chest pain from a fall. She had noticeable melena on exam per EDP. -The patient was given 80 mg of IV Protonix in the ED. Continue Protonix every 12 hours. Hold aspirin. -Start gentle IV fluids. -GI has been consulted. The tentative plan is for an EGD today.  Acute blood loss anemia superimposed on iron deficiency anemia due to chronic blood loss. Patient is followed by hematology. She receives outpatient intravenous iron periodically. Recent hemoglobin several days ago was within normal limits, so she was not given an iron transfusion. She has had a near 4 g drop in her hemoglobin from 12.4-8.1 on admission. -EDP has ordered transfusion of 2 units of packed red blood cells. -We'll continue to monitor for further need for transfusions.  Azotemia secondary to acute and chronic blood loss. Patient's BUN was 44 on admission with a normal creatinine. Will start gentle IV fluids.  -COPD in the setting of tobacco abuse. Patient was advised to stop smoking. She declines a nicotine patch. We'll continue her chronic albuterol inhaler.  Right shoulder and right upper chest wall pain. Patient developed pain after falling at home 2 weeks ago.  X-rays in the ED were nonacute. We'll treat her pain with as needed analgesics.  Depression with anxiety. She will be continued on Celexa and Xanax as needed.       Code Status: Full code DVT Prophylaxis: SCDs Family Communication: Discussed  with son Disposition Plan: Anticipate discharge in a couple days.  Time spent: One hour  [facility] Pager [phone]

## 2015-11-27 NOTE — Anesthesia Postprocedure Evaluation (Deleted)
Anesthesia Post Note  Patient: Carol Massey  Procedure(s) Performed: Procedure(s) (LRB): ESOPHAGOGASTRODUODENOSCOPY (EGD) (N/A) GIVENS CAPSULE STUDY (N/A)  Patient location during evaluation: PACU Anesthesia Type: MAC Level of consciousness: awake and alert Pain management: pain level controlled Vital Signs Assessment: post-procedure vital signs reviewed and stable Respiratory status: spontaneous breathing Cardiovascular status: blood pressure returned to baseline Postop Assessment: no signs of nausea or vomiting Anesthetic complications: no    Last Vitals:  Filed Vitals:   11/27/15 1030 11/27/15 1106  BP: 106/62 114/43  Pulse: 79 79  Temp: 36.8 C 36.9 C  Resp: 20 20    Last Pain: There were no vitals filed for this visit.               Nikki Glanzer

## 2015-11-27 NOTE — Op Note (Addendum)
[facility] Patient Name: Carol Massey Procedure Date: 11/27/2015 12:00 PM MRN: [id number] Date of Birth: 13-Mar-1926 Attending MD: Norvel Richards , MD CSN: [id number] Age: 82 Admit Type: Inpatient Procedure:                Upper GI endoscopy Indications:              Melena Providers:                Norvel Richards, MD, Janeece Riggers, RN, Lurline Del, RN Referring MD:             Elayne Snare. Wolfgang Phoenix, MD (Referring MD) Medicines:                Midazolam 2 mg IV, Meperidine 25 mg IV, Ondansetron                            4 mg IV Complications:            No immediate complications. Estimated Blood Loss:     Estimated blood loss was minimal. Procedure:                Pre-Anesthesia Assessment:                           - Prior to the procedure, a History and Physical                            was performed, and patient medications and                            allergies were reviewed. The patient's tolerance of                            previous anesthesia was also reviewed. The risks                            and benefits of the procedure and the sedation                            options and risks were discussed with the patient.                            All questions were answered, and informed consent                            was obtained. Prior Anticoagulants: The patient has                            taken no previous anticoagulant or antiplatelet                            agents and last took aspirin 1 day prior to the  procedure. ASA Grade Assessment: III - A patient                            with severe systemic disease. After reviewing the                            risks and benefits, the patient was deemed in                            satisfactory condition to undergo the procedure.                           After obtaining informed consent, the endoscope was                            passed under  direct vision. Throughout the                            procedure, the patient's blood pressure, pulse, and                            oxygen saturations were monitored continuously. The                            Endoscope was introduced through the mouth, and                            advanced to the second part of duodenum. The upper                            GI endoscopy was accomplished without difficulty.                            The patient tolerated the procedure well. The                            patient tolerated the procedure well. Scope In: 2:24:21 PM Scope Out: 2:40:45 PM Total Procedure Duration: 0 hours 16 minutes 24 seconds  Findings:      A web was found at the cricopharyngeus. The scope was withdrawn.       Dilation was performed with a Maloney dilator with no resistance at 41       Fr. The dilation site was examined following endoscope reinsertion and       showed complete resolution of luminal narrowing. Estimated blood loss       was minimal.      One non-bleeding linear gastric ulcer with no stigmata of bleeding was       found on the anterior wall of the gastric antrum. The lesion was 5 mm in       largest dimension. Multiple satellite erosions. Small hiatal hernia.       Patent pylorus. This was biopsied with a cold forceps for histology.       Biopsies were taken with a cold forceps for histology. Estimated blood       loss was minimal.  One non-bleeding cratered duodenal ulcer with no stigmata of bleeding       was found in the duodenal bulb. The lesion was 10 mm in largest       dimension. satellite erosions present. 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 Dilated as described.                           - Non-bleeding gastric ulcer with no stigmata of                            bleeding.                           - One non-bleeding duodenal ulcer with no stigmata                            of bleeding. gastric biopsies taken. Capsule  study                            of small bowel not needed. Moderate Sedation:      Moderate (conscious) sedation was administered by the endoscopy nurse       and supervised by the endoscopist. The following parameters were       monitored: oxygen saturation, heart rate, blood pressure, respiratory       rate, EKG, adequacy of pulmonary ventilation, and response to care.       Total physician intraservice time was 21 minutes. Recommendation:           - Return patient to hospital ward for ongoing care.                           - Mechanical soft diet today.                           - No aspirin, ibuprofen, naproxen, or other                            non-steroidal anti-inflammatory drugs. Follow-up on                            pathology. Repeat EGD in 3 months                           - Await pathology results.                           - Repeat upper endoscopy in 3 months for                            surveillance. Procedure Code(s):        --- Professional ---                           [phone], Esophagogastroduodenoscopy, flexible,  transoral; with biopsy, single or multiple                           43450, Dilation of esophagus, by unguided sound or                            bougie, single or multiple passes                           99152, Moderate sedation services provided by the                            same physician or other qualified health care                            professional performing the diagnostic or                            therapeutic service that the sedation supports,                            requiring the presence of an independent trained                            observer to assist in the monitoring of the                            patient's level of consciousness and physiological                            status; initial 15 minutes of intraservice time,                            patient age 19 years or  older Diagnosis Code(s):        --- Professional ---                           Q39.4, Esophageal web                           K25.9, Gastric ulcer, unspecified as acute or                            chronic, without hemorrhage or perforation                           K26.9, Duodenal ulcer, unspecified as acute or                            chronic, without hemorrhage or perforation                           K92.1, Melena (includes Hematochezia) CPT copyright 2016 [employer]. All rights reserved. The codes documented in this report are preliminary and upon coder review may  be revised  to meet current compliance requirements. [name]. Coltyn Hanning, MD Norvel Richards, MD 11/27/2015 3:14:35 PM This report has been signed electronically. Number of Addenda: 1 Addendum Number: 1   Addendum Date: 11/27/2015 3:16:44 PM      My findings and recommendations regarding today's procedure were       communicated to the patient's son, Timekia Upson at [place]. Corianne Buccellato, MD Norvel Richards, MD 11/27/2015 3:18:46 PM This report has been signed electronically.

## 2015-11-27 NOTE — Consult Note (Signed)
Referring Provider: Dr. Caryn Section  Primary Care Physician:  Sallee Lange, MD Primary Gastroenterologist:  Dr. Gala Romney   Date of Admission: 11/27/15 Date of Consultation: 11/27/15  Reason for Consultation:  Melena   HPI:  Carol Massey is a 82 y.o. year old female presenting to the ED with 3 days of nausea, vomiting, diarrhea, and fatigue. Her admitting Hgb was 8, down from 12.4 several days ago. She is followed by Hematology for IDA, receiving iron infusions as needed. Her last colonoscopy was in 2014. 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 2 units PRBCs ordered but have not been given yet.   Son at bedside. Patient is hard of hearing. Hemodynamically stable. Does not appear in distress. States she had acute onset of symptoms starting Saturday. Felt dizzy, had N/V but no hematemesis. Son noted the stool was "somewhat black" but was rarely able to see as patient would flush before he could see. No hematochezia. No weight loss or lack of appetite. Notes dysphagia for the past 6 months to a year. Solid food. Sometimes choking on food. States she takes a "baby aspirin" every day but then noted that she has recently started taking the "stronger ones". States she has only taken 3 of the 325 mg ASA but son seems to think this may not be accurate. Unclear how often she has taken this. No BC powders or Goody powders. No Ibuprofen, Aleve, Advil, etc. Gross melena on exam in ED.    Past Medical History  Diagnosis Date  . Stroke ([place])   . Hypertension   . Osteoporosis   . COPD (chronic obstructive pulmonary disease) ([place])   . Anemia   . Hard of hearing   . Iron deficiency anemia due to chronic blood loss 05/27/2014    Past Surgical History  Procedure Laterality Date  . Appendectomy    . Abdominal  hysterectomy    . Tonsillectomy    . Colonoscopy  03/2005    Dr. Gala Romney: rectal and rectosigmoid polyps removed  . Esophagogastroduodenoscopy  03/2005    Dr. Gala Romney: tiny distal esophageal erosions  . Small bowel capsule study  03/2005    Dr. Laural Golden: two small Dieulafoy lesions at ascending colon. tiney specks of blood at duodenal bulb but no identified lesions  . Colonoscopy with esophagogastroduodenoscopy (egd) N/A 08/12/2013    Dr. Gala Romney: colonoscopy with tubular adenoma, colonic diverticulosis. EGD with non-critical Schatzki's ring, non-manipulated, and gastric AVM with evidence of recent bleeding, s/p thermal sealing and hemostasis clip    Prior to Admission medications   Medication Sig Start Date End Date Taking? Authorizing Provider  alendronate (FOSAMAX) 70 MG tablet TAKE 1 TABLET BY MOUTH EVERY 7 DAYS. TAKE WITH A FULL GLASS OF WATER ONK AN EMPTY STOMACH 06/26/15  Yes [name], MD  ALPRAZolam (XANAX) 0.25 MG tablet TAKE 1 TABLET TWICE A DAY AS NEEDED 06/08/15  Yes [name], MD  ASCORBIC ACID PO Take 1 capsule by mouth daily.   Yes Historical Provider, MD  aspirin 81 MG tablet Take 325 mg by mouth daily.    Yes Historical Provider, MD  Calcium Carb-Cholecalciferol (CALCIUM + D3 PO) Take 1 capsule by mouth daily.    Yes Historical Provider, MD  citalopram (CELEXA) 20 MG tablet TAKE 1 TABLET (20 MG TOTAL) BY MOUTH  DAILY. 06/08/15  Yes [name], MD  PROVENTIL HFA 108 (90 BASE) MCG/ACT inhaler INHALE 2 PUFFS INTO THE LUNGS EVERY 6 (SIX) HOURS AS NEEDED FOR WHEEZING. 04/24/15  Yes Mikey Kirschner, MD    Current Facility-Administered Medications  Medication Dose Route Frequency Provider Last Rate Last Dose  . 0.9 %  sodium chloride infusion   Intravenous STAT Ezequiel Essex, MD 50 mL/hr at 11/27/15 1002      Allergies as of 11/27/2015  . (No Known Allergies)    Family History  Problem Relation Age of Onset  . Hypertension Mother   . Leukemia Father 20  . Stroke  Mother   . Colon cancer Neg Hx     Social History   Social History  . Marital Status: Married    Spouse Name: N/A  . Number of Children: 3  . Years of Education: N/A   Occupational History  . retired from [employer] History Main Topics  . Smoking status: Current Some Day Smoker -- 0.50 packs/day for 70 years    Types: Cigarettes  . Smokeless tobacco: Never Used     Comment: about 1/2 ppd  . Alcohol Use: No  . Drug Use: No  . Sexual Activity: Not on file   Other Topics Concern  . Not on file   Social History Narrative    Review of Systems: Gen: Denies fever, chills, loss of appetite, change in weight or weight loss CV: Denies chest pain, heart palpitations, syncope, edema  Resp: +DOE GI: see HPI  GU : Denies urinary burning, urinary frequency, urinary incontinence.  MS: +right-sided rib discomfort, lower extremity aches and pains.  Derm: Denies rash, itching, dry skin Psych: Denies depression, anxiety,confusion, or memory loss Heme: see HPI   Physical Exam: Vital signs in last 24 hours: Temp:  [98.2 F (36.8 C)-98.5 F (36.9 C)] 98.5 F (36.9 C) (04/03 1106) Pulse Rate:  [77-85] 79 (04/03 1106) Resp:  [18-20] 20 (04/03 1106) BP: (106-138)/(43-68) 114/43 mmHg (04/03 1106) SpO2:  [96 %-98 %] 96 % (04/03 1106) Weight:  [95 lb 7.4 oz (43.3 kg)-96 lb (43.545 kg)] 95 lb 7.4 oz (43.3 kg) (04/03 1106)   General:   Alert, petite, frail-appearing, pale.  Head:  Normocephalic and atraumatic. Eyes:  Sclera clear, no icterus.   Conjunctiva pink. Ears:  Hard of hearing  Nose:  No deformity, discharge,  or lesions. Lungs:  Scattered rhonchi and diminished in bases Heart:  Regular rate and rhythm; no murmurs Abdomen:  Soft, nontender and nondistended. No masses, hepatosplenomegaly or hernias noted. Normal bowel sounds, without guarding, and without rebound.   Rectal:  Deferred  Msk:  Symmetrical without gross deformities.  Extremities:  Without  edema. Neurologic:  Alert and  oriented x4 Psych:  Alert and cooperative. Normal mood and affect.  Intake/Output from previous day:   Intake/Output this shift:    Lab Results:  Recent Labs  11/27/15 0734  WBC 5.6  HGB 8.1*  HCT 24.6*  PLT 185   BMET  Recent Labs  11/27/15 0734  NA 138  K 4.4  CL 107  CO2 26  GLUCOSE 110*  BUN 44*  CREATININE 0.67  CALCIUM 8.3*   LFT  Recent Labs  11/27/15 0734  PROT 5.4*  ALBUMIN 3.2*  AST 17  ALT 12*  ALKPHOS 54  BILITOT 0.4   PT/INR  Recent Labs  11/27/15 0734  LABPROT 14.7  INR 1.14    Studies/Results: Dg Ribs  Unilateral W/chest Right  11/27/2015  CLINICAL DATA:  Right side rib pain. [address] and hit right side on door frame 2 weeks ago. EXAM: RIGHT RIBS AND CHEST - 3+ VIEW COMPARISON:  11/09/2013 FINDINGS: No visible rib fracture or pneumothorax. No effusion. There is hyperinflation of the lungs compatible with COPD. No confluent opacity. Heart is borderline in size. IMPRESSION: COPD. No active disease. No visible rib fracture or pneumothorax. Electronically Signed   By: Rolm Baptise M.D.   On: 11/27/2015 08:30   Dg Shoulder Right  11/27/2015  CLINICAL DATA:  Fall 2 weeks ago.  Right shoulder and rib pain. EXAM: RIGHT SHOULDER - 2+ VIEW COMPARISON:  None. FINDINGS: Mild degenerative changes in the right shoulder with joint space narrowing and spurring. No acute bony abnormality. Specifically, no fracture, subluxation, or dislocation. Soft tissues are intact. IMPRESSION: No acute bony abnormality. Electronically Signed   By: Rolm Baptise M.D.   On: 11/27/2015 08:33    Impression: 82 year old very pleasant female presenting with acute blood loss anemia, melena, with likely upper GI or small bowel source. She has a history of IDA, closely followed by hematology. 4 gram drop noted in Hgb over past several days. Endorses 81 mg aspirin use as well as 325 mg aspirin use; it is unclear how much of this she has taken over the past  several weeks. This likely precipitated an upper GI bleed in the setting of known AVMs. 2 units PRBCs have been ordered but not infusing yet. She is hemodynamically stable at this time.   Esophageal dysphagia: noted for past 6-12 months. History of Schatzki's ring on last EGD non-manipulated, as she was not having any problems at that time.   Will pursue EGD+/-dilation with possible capsule deployment at time of EGD if no obvious bleeding source found in upper GI tract. The risks and benefits were discussed in detail with patient and son at the bedside. Both stated understanding and were agreeable to proceed.    Plan: Remain NPO Start PPI BID Agree with transfusion EGD+/-dilation, +/- capsule study with Dr. Gala Romney today, likely early afternoon Avoidance of NSAIDs, aspirin indefinitely.    Orvil Feil, ANP-BC [facility]     LOS: 0 days    11/27/2015, 11:14 AM

## 2015-11-27 NOTE — ED Provider Notes (Signed)
CSN: [id number]     Arrival date & time 11/27/15  0704 History   First MD Initiated Contact with Patient 11/27/15 [phone]     Chief Complaint  Patient presents with  . Emesis     (Consider location/radiation/quality/duration/timing/severity/associated sxs/prior Treatment) HPI Comments: Patient with history of previous stroke, hypertension, COPD, iron deficiency anemia presenting with a three-day history of nausea vomiting diarrhea as well as generalized weakness and dizziness. Also complains of pain to right rib cage since fall 2 weeks ago. Contrary to triage note is not her right shoulder. Denies hitting her head or neck. She is not on any blood thinners other than aspirin. She denies any abdominal pain, fever, dysuria, hematuria. Son states she was able to eat some [place] yesterday. She has had a poor appetite with at least 2-3 episodes of vomiting daily for the past 3 days. Also has 2 episodes of diarrhea. No blood in the stools. No sick contacts, recent travel or antibiotic use. Was seen in hematology clinic 3 days ago and was doing well. Dizziness is described as a lightheadedness and generalized weakness rather than vertigo. No focal weakness, numbness or tingling.  The history is provided by the patient and a relative.    Past Medical History  Diagnosis Date  . Stroke ([place])   . Hypertension   . Osteoporosis   . COPD (chronic obstructive pulmonary disease) ([place])   . Anemia   . Hard of hearing   . Iron deficiency anemia due to chronic blood loss 05/27/2014  . Gastric AVM 2014  . Dieulafoy lesion of colon 2006  . Major depression in remission ([place]) 07/24/2015  . Generalized anxiety disorder 07/24/2015   Past Surgical History  Procedure Laterality Date  . Appendectomy    . Abdominal hysterectomy    . Tonsillectomy    . Colonoscopy  03/2005    Dr. Gala Romney: rectal and rectosigmoid polyps removed  . Esophagogastroduodenoscopy  03/2005    Dr. Gala Romney: tiny distal esophageal erosions  .  Small bowel capsule study  03/2005    Dr. Laural Golden: two small Dieulafoy lesions at ascending colon. tiney specks of blood at duodenal bulb but no identified lesions  . Colonoscopy with esophagogastroduodenoscopy (egd) N/A 08/12/2013    Dr. Gala Romney: colonoscopy with tubular adenoma, colonic diverticulosis. EGD with non-critical Schatzki's ring, non-manipulated, and gastric AVM with evidence of recent bleeding, s/p thermal sealing and hemostasis clip   Family History  Problem Relation Age of Onset  . Hypertension Mother   . Leukemia Father 28  . Stroke Mother   . Colon cancer Neg Hx    Social History  Substance Use Topics  . Smoking status: Current Some Day Smoker -- 0.50 packs/day for 70 years    Types: Cigarettes  . Smokeless tobacco: Never Used     Comment: about 1/2 ppd  . Alcohol Use: No   OB History    No data available     Review of Systems  Constitutional: Positive for activity change, appetite change and fatigue. Negative for fever.  HENT: Negative for congestion and rhinorrhea.   Respiratory: Negative for cough, chest tightness, shortness of breath and wheezing.   Cardiovascular: Negative for chest pain.  Gastrointestinal: Positive for nausea, vomiting and diarrhea. Negative for abdominal pain.  Genitourinary: Negative for dysuria, hematuria, vaginal bleeding and vaginal discharge.  Musculoskeletal: Negative for myalgias and arthralgias.  Neurological: Positive for dizziness, weakness and light-headedness. Negative for headaches.  A complete 10 system review of systems was obtained and all  systems are negative except as noted in the HPI and PMH.      Allergies  Review of patient's allergies indicates no known allergies.  Home Medications   Prior to Admission medications   Medication Sig Start Date End Date Taking? Authorizing Provider  alendronate (FOSAMAX) 70 MG tablet TAKE 1 TABLET BY MOUTH EVERY 7 DAYS. TAKE WITH A FULL GLASS OF WATER ONK AN EMPTY STOMACH 06/26/15   Yes [name], MD  ALPRAZolam (XANAX) 0.25 MG tablet TAKE 1 TABLET TWICE A DAY AS NEEDED 06/08/15  Yes [name], MD  ASCORBIC ACID PO Take 1 capsule by mouth daily.   Yes Historical Provider, MD  aspirin 81 MG tablet Take 325 mg by mouth daily.    Yes Historical Provider, MD  Calcium Carb-Cholecalciferol (CALCIUM + D3 PO) Take 1 capsule by mouth daily.    Yes Historical Provider, MD  citalopram (CELEXA) 20 MG tablet TAKE 1 TABLET (20 MG TOTAL) BY MOUTH DAILY. 06/08/15  Yes [name], MD  PROVENTIL HFA 108 (90 BASE) MCG/ACT inhaler INHALE 2 PUFFS INTO THE LUNGS EVERY 6 (SIX) HOURS AS NEEDED FOR WHEEZING. 04/24/15  Yes Mikey Kirschner, MD   BP 120/61 mmHg  Pulse 83  Temp(Src) 98.8 F (37.1 C) (Oral)  Resp 18  Ht 4\' 9"  (1.448 m)  Wt 95 lb 7.4 oz (43.3 kg)  BMI 20.65 kg/m2  SpO2 97% Physical Exam  Constitutional: She is oriented to person, place, and time. She appears well-developed and well-nourished. No distress.  Elderly and frail appearing  HENT:  Head: Normocephalic and atraumatic.  Mouth/Throat: Oropharynx is clear and moist. No oropharyngeal exudate.  Dry mucus membranes  Eyes: Conjunctivae and EOM are normal. Pupils are equal, round, and reactive to light.  Neck: Normal range of motion. Neck supple.  No meningismus.  Cardiovascular: Normal rate, regular rhythm and intact distal pulses.   Murmur heard. Pulmonary/Chest: Effort normal and breath sounds normal. No respiratory distress. She exhibits tenderness.  R anterior and lateral rib tenderness. No crepitance or ecchymosis  Abdominal: Soft. There is no tenderness. There is no rebound and no guarding.  Musculoskeletal: Normal range of motion. She exhibits no edema or tenderness.  Neurological: She is alert and oriented to person, place, and time. No cranial nerve deficit. She exhibits normal muscle tone. Coordination normal.  No ataxia on finger to nose bilaterally. No pronator drift. 5/5 strength throughout. CN  2-12 intact.Equal grip strength. Sensation intact.   Skin: Skin is warm.  Psychiatric: She has a normal mood and affect. Her behavior is normal.  Nursing note and vitals reviewed.   ED Course  Procedures (including critical care time) Labs Review Labs Reviewed  CBC WITH DIFFERENTIAL/PLATELET - Abnormal; Notable for the following:    RBC 2.71 (*)    Hemoglobin 8.1 (*)    HCT 24.6 (*)    RDW 19.0 (*)    All other components within normal limits  COMPREHENSIVE METABOLIC PANEL - Abnormal; Notable for the following:    Glucose, Bld 110 (*)    BUN 44 (*)    Calcium 8.3 (*)    Total Protein 5.4 (*)    Albumin 3.2 (*)    ALT 12 (*)    All other components within normal limits  URINALYSIS, ROUTINE W REFLEX MICROSCOPIC (NOT AT [facility]) - Abnormal; Notable for the following:    Hgb urine dipstick LARGE (*)    All other components within normal limits  URINE MICROSCOPIC-ADD ON -  Abnormal; Notable for the following:    Bacteria, UA MANY (*)    All other components within normal limits  POC OCCULT BLOOD, ED - Abnormal; Notable for the following:    Fecal Occult Bld POSITIVE (*)    All other components within normal limits  TROPONIN I  LIPASE, BLOOD  PROTIME-INR  BASIC METABOLIC PANEL  CBC  I-STAT CG4 LACTIC ACID, ED  I-STAT CG4 LACTIC ACID, ED  TYPE AND SCREEN  PREPARE RBC (CROSSMATCH)  SURGICAL PATHOLOGY    Imaging Review Dg Ribs Unilateral W/chest Right  11/27/2015  CLINICAL DATA:  Right side rib pain. [address] and hit right side on door frame 2 weeks ago. EXAM: RIGHT RIBS AND CHEST - 3+ VIEW COMPARISON:  11/09/2013 FINDINGS: No visible rib fracture or pneumothorax. No effusion. There is hyperinflation of the lungs compatible with COPD. No confluent opacity. Heart is borderline in size. IMPRESSION: COPD. No active disease. No visible rib fracture or pneumothorax. Electronically Signed   By: Rolm Baptise M.D.   On: 11/27/2015 08:30   Dg Shoulder Right  11/27/2015  CLINICAL DATA:  Fall 2  weeks ago.  Right shoulder and rib pain. EXAM: RIGHT SHOULDER - 2+ VIEW COMPARISON:  None. FINDINGS: Mild degenerative changes in the right shoulder with joint space narrowing and spurring. No acute bony abnormality. Specifically, no fracture, subluxation, or dislocation. Soft tissues are intact. IMPRESSION: No acute bony abnormality. Electronically Signed   By: Rolm Baptise M.D.   On: 11/27/2015 08:33   I have personally reviewed and evaluated these images and lab results as part of my medical decision-making.   EKG Interpretation   Date/Time:  Monday November 27 2015 07:15:41 EDT Ventricular Rate:  84 PR Interval:  156 QRS Duration: 89 QT Interval:  374 QTC Calculation: 442 R Axis:   20 Text Interpretation:  Sinus rhythm RSR' in V1 or V2, right VCD or RVH No  previous ECGs available Confirmed by Gergory Biello  MD, Verna Desrocher [phone]) on  11/27/2015 7:45:11 AM      MDM   Final diagnoses:  Melena   3 days of nausea vomiting and diarrhea with generalized weakness and dizziness. No abdominal pain, chest pain, shortness of breath. No fever or sick contacts.   Hemoglobin has dropped from 12.4 to 8 in the past 3 days. Shee denies any blood in her stools.  Patient with gross melena on exam. Last EGD in 2014 showed gastric AVMs with normal colonoscopy.  Patient would benefit from blood transfusion. She is agreeable. She is ordered units of packed red blood cells, IV PPI.  Vitals remain stable in the ED. Discussed with Dr. Caryn Section who will admit patient. Also discussed with Dr. Gala Romney who will consult for gastroenterology.   CRITICAL CARE Performed by: Ezequiel Essex Total critical care time: 40 minutes Critical care time was exclusive of separately billable procedures and treating other patients. Critical care was necessary to treat or prevent imminent or life-threatening deterioration. Critical care was time spent personally by me on the following activities: development of treatment plan with  patient and/or surrogate as well as nursing, discussions with consultants, evaluation of patient's response to treatment, examination of patient, obtaining history from patient or surrogate, ordering and performing treatments and interventions, ordering and review of laboratory studies, ordering and review of radiographic studies, pulse oximetry and re-evaluation of patient's condition.   Ezequiel Essex, MD 11/27/15 [phone]

## 2015-11-28 ENCOUNTER — Encounter (HOSPITAL_COMMUNITY): Payer: Self-pay | Admitting: Internal Medicine

## 2015-11-28 DIAGNOSIS — F411 Generalized anxiety disorder: Secondary | ICD-10-CM

## 2015-11-28 DIAGNOSIS — D500 Iron deficiency anemia secondary to blood loss (chronic): Secondary | ICD-10-CM | POA: Diagnosis not present

## 2015-11-28 DIAGNOSIS — R7989 Other specified abnormal findings of blood chemistry: Secondary | ICD-10-CM | POA: Diagnosis not present

## 2015-11-28 DIAGNOSIS — K279 Peptic ulcer, site unspecified, unspecified as acute or chronic, without hemorrhage or perforation: Secondary | ICD-10-CM | POA: Diagnosis present

## 2015-11-28 DIAGNOSIS — K921 Melena: Secondary | ICD-10-CM | POA: Diagnosis not present

## 2015-11-28 LAB — CBC
HCT: 30.5 % — ABNORMAL LOW (ref 36.0–46.0)
HEMOGLOBIN: 10.3 g/dL — AB (ref 12.0–15.0)
MCH: 30.1 pg (ref 26.0–34.0)
MCHC: 33.8 g/dL (ref 30.0–36.0)
MCV: 89.2 fL (ref 78.0–100.0)
PLATELETS: 158 10*3/uL (ref 150–400)
RBC: 3.42 MIL/uL — ABNORMAL LOW (ref 3.87–5.11)
RDW: 17.8 % — ABNORMAL HIGH (ref 11.5–15.5)
WBC: 6.2 10*3/uL (ref 4.0–10.5)

## 2015-11-28 LAB — TYPE AND SCREEN
ABO/RH(D): O POS
ANTIBODY SCREEN: NEGATIVE
UNIT DIVISION: 0
UNIT DIVISION: 0

## 2015-11-28 LAB — BASIC METABOLIC PANEL
ANION GAP: 5 (ref 5–15)
BUN: 18 mg/dL (ref 6–20)
CALCIUM: 8 mg/dL — AB (ref 8.9–10.3)
CO2: 26 mmol/L (ref 22–32)
CREATININE: 0.64 mg/dL (ref 0.44–1.00)
Chloride: 110 mmol/L (ref 101–111)
GLUCOSE: 97 mg/dL (ref 65–99)
Potassium: 3.8 mmol/L (ref 3.5–5.1)
Sodium: 141 mmol/L (ref 135–145)

## 2015-11-28 MED ORDER — PANTOPRAZOLE SODIUM 40 MG PO TBEC: 40.0000 mg | DELAYED_RELEASE_TABLET | Freq: Two times a day (BID) | ORAL | Status: DC

## 2015-11-28 MED ORDER — HYDROCODONE-ACETAMINOPHEN 5-325 MG PO TABS: 1.0000 | ORAL_TABLET | ORAL | Status: DC | PRN

## 2015-11-28 NOTE — Care Management Note (Signed)
Case Management Note  Patient Details  Name: Carol Massey MRN: [id number] Date of Birth: 02-08-1926  Subjective/Objective:                  Pt admitted for melena. Pt is from home, lives with her husband, family is at the bedside and appear very supportive. Pt is ind with ADL's. Pt has PCP, transportation to appointments and no difficulty obtaining medications. Pt plans to return home with self care.   Action/Plan: Pt discharging home today. No CM needs.   Expected Discharge Date:    11/28/2015             Expected Discharge Plan:  Home/Self Care  In-House Referral:  NA  Discharge planning Services  CM Consult  Post Acute Care Choice:  NA Choice offered to:  NA  DME Arranged:    DME Agency:     HH Arranged:    HH Agency:     Status of Service:  Completed, signed off  Medicare Important Message Given:    Date Medicare IM Given:    Medicare IM give by:    Date Additional Medicare IM Given:    Additional Medicare Important Message give by:     If discussed at [place] of Stay Meetings, dates discussed:    Additional Comments:  Sherald Barge, RN 11/28/2015, 3:45 PM

## 2015-11-28 NOTE — Progress Notes (Signed)
    Subjective: No abdominal pain, N/V. No overt GI bleeding.   Objective: Vital signs in last 24 hours: Temp:  [98.2 F (36.8 C)-99.7 F (37.6 C)] 98.9 F (37.2 C) (04/04 0515) Pulse Rate:  [74-91] 75 (04/04 0515) Resp:  [16-29] 18 (04/04 0515) BP: (104-181)/(43-95) 148/56 mmHg (04/04 0515) SpO2:  [95 %-100 %] 97 % (04/04 0515) Weight:  [95 lb 7.4 oz (43.3 kg)-101 lb 3.1 oz (45.9 kg)] 101 lb 3.1 oz (45.9 kg) (04/03 2102) Last BM Date: 11/27/15 General:   Alert and oriented, pleasant Abdomen:  Bowel sounds present, soft, non-tender, non-distended.  Extremities:  Without  edema. Neurologic:  Alert and  oriented x4 Psych:  Alert and cooperative. Normal mood and affect.  Intake/Output from previous day: 04/03 0701 - 04/04 0700 In: 1439.7 [P.O.:240; I.V.:828.7; Blood:371] Out: 900 [Urine:900] Intake/Output this shift:    Lab Results:  Recent Labs  11/27/15 0734 11/28/15 0541  WBC 5.6 6.2  HGB 8.1* 10.3*  HCT 24.6* 30.5*  PLT 185 158   BMET  Recent Labs  11/27/15 0734 11/28/15 0541  NA 138 141  K 4.4 3.8  CL 107 110  CO2 26 26  GLUCOSE 110* 97  BUN 44* 18  CREATININE 0.67 0.64  CALCIUM 8.3* 8.0*   LFT  Recent Labs  11/27/15 0734  PROT 5.4*  ALBUMIN 3.2*  AST 17  ALT 12*  ALKPHOS 54  BILITOT 0.4   PT/INR  Recent Labs  11/27/15 0734  LABPROT 14.7  INR 1.14     Studies/Results: Dg Ribs Unilateral W/chest Right  11/27/2015  CLINICAL DATA:  Right side rib pain. [address] and hit right side on door frame 2 weeks ago. EXAM: RIGHT RIBS AND CHEST - 3+ VIEW COMPARISON:  11/09/2013 FINDINGS: No visible rib fracture or pneumothorax. No effusion. There is hyperinflation of the lungs compatible with COPD. No confluent opacity. Heart is borderline in size. IMPRESSION: COPD. No active disease. No visible rib fracture or pneumothorax. Electronically Signed   By: Rolm Baptise M.D.   On: 11/27/2015 08:30   Dg Shoulder Right  11/27/2015  CLINICAL DATA:  Fall 2 weeks  ago.  Right shoulder and rib pain. EXAM: RIGHT SHOULDER - 2+ VIEW COMPARISON:  None. FINDINGS: Mild degenerative changes in the right shoulder with joint space narrowing and spurring. No acute bony abnormality. Specifically, no fracture, subluxation, or dislocation. Soft tissues are intact. IMPRESSION: No acute bony abnormality. Electronically Signed   By: Rolm Baptise M.D.   On: 11/27/2015 08:33    Assessment: 82 year old with acute blood loss anemia secondary to PUD. EGD yetserday with non-bleeding gastric ulcer, one non-bleeding duodenal ulcer. Web s/p dilation due to reported dysphagia. Pathology pending. No further overt GI bleeding and Hgb improved with 2 units PRBCs. Hopeful discharge soon.   Plan: PPI BID Avoidance of NSAIDs, aspirin Soft diet EGD in 3 months for surveillance  Appropriate from a GI standpoint for discharge   Orvil Feil, ANP-BC [facility]astroenter[facility]    LOS: 1 day    11/28/2015, 7:48 AM

## 2015-11-28 NOTE — Discharge Summary (Signed)
Physician Discharge Summary  Carol Massey [id number] DOB: 1926-03-25 DOA: 11/27/1978  PCP: Sallee Lange, MD  Admit date: 11/27/2015 Discharge date: 11/28/2015  Time spent:  minutes  Recommendations for Outpatient Follow-up:  1. Gastric biopsy pending, per GI. 2. Patient instructed not to take aspirin or NSAIDs.    Discharge Diagnoses:  1. Upper GI bleed, secondary to peptic ulcer disease. 2. Nonbleeding gastric ulcer and nonbleeding duodenal ulcer with no stigmata of bleeding per EGD 11/27/15. 3. Acute on chronic blood loss anemia. 4. Mild dysphagia status post dilatation of wet at the cricopharyngeus per EGD. 5. Azotemia secondary to GI bleeding. 6. COPD, remained stable. 7. Tobacco abuse. The patient was advised to stop smoking. 8. Depression with anxiety. 9. Right-sided shoulder and chest pain, following a fall at home 2 weeks ago. No radiographic fractures.   Discharge Condition: Improved.  Diet recommendation: Heart healthy.  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 She presented to the ED on 11/27/15 with a complaint of dizziness, nausea/vomiting, and black stools. In the ED, she was afebrile and hemodynamically stable. Her lab data were significant for hemoglobin of 8.1, BUN of 44, normal troponin I, and glucose of 110. Chest x-ray and rib films revealed COPD, no active disease, and no visible rib fracture or pneumothorax. Right shoulder x-ray revealed no acute abnormality. She was admitted for further evaluation and management.   Hospital Course:  Patient was started on gentle IV fluids. She was given 80 mg of Protonix in the ED; it  was continued IV every 12 hours. Aspirin was held. Due to the 4 g drop in her hemoglobin, she was transfused 2 units of packed red blood cells. Her hemoglobin improved appropriately. Her hemoglobin/hematocrit were monitored closely. Gastroenterologist, Dr. Gala Romney was consulted. He performed an EGD. It revealed a web at the cricopharyngeus which was dilated, nonbleeding gastric ulcer with no stigmata of bleeding, and one nonbleeding duodenal ulcer with no stigmata of bleeding; gastric biopsies taken. Patient was instructed to avoid aspirin and NSAIDs. Her diet was advanced which she tolerated well. Her hemoglobin remained stable. She will need a follow-up EGD in 3 months per Dr. Gala Romney.  Patient was restarted on her chronic medications with exception of aspirin. She was prescribed Vicodin as needed for pain. She was also prescribed Protonix to be taken twice a day. Her azotemia resolved with IV fluids. She was discharged in improved and stable condition.  Procedures:  EGD 11/27/15-results above.  Consultations:  Gastroenterologist, Dr. Gala Romney  Discharge Exam: Filed Vitals:   11/27/15 2102 11/28/15 0515  BP: 138/54 148/56  Pulse: 77 75  Temp: 98.6 F (37 C) 98.9 F (37.2 C)  Resp: 16 18    General: Pleasant 82 year old woman in no acute distress. Cardiovascular: S1, S2, with a soft systolic murmur. Respiratory: Decreased breath sounds in the bases, otherwise clear. Abdomen: Positive bowel sounds, soft, nontender, nondistended.  Discharge Instructions   Discharge Instructions    Diet - low sodium heart healthy    Complete by:  As directed      Discharge instructions    Complete by:  As directed  Do not take aspirin or aspirin-like products.     Increase activity slowly    Complete by:  As directed           Current Discharge Medication List    START taking these medications   Details  HYDROcodone-acetaminophen (NORCO/VICODIN) 5-325 MG tablet Take 1 tablet by mouth every 4  (four) hours as needed for moderate pain or severe pain. Qty: 30 tablet, Refills: 0    pantoprazole (PROTONIX) 40 MG tablet Take 1 tablet (40 mg total) by mouth 2 (two) times daily before a meal. For treatment of the ulcer. Qty: 60 tablet, Refills: 6      CONTINUE these medications which have NOT CHANGED   Details  alendronate (FOSAMAX) 70 MG tablet TAKE 1 TABLET BY MOUTH EVERY 7 DAYS. TAKE WITH A FULL GLASS OF WATER ONK AN EMPTY STOMACH Qty: 12 tablet, Refills: 1    ALPRAZolam (XANAX) 0.25 MG tablet TAKE 1 TABLET TWICE A DAY AS NEEDED Qty: 45 tablet, Refills: 5    ASCORBIC ACID PO Take 1 capsule by mouth daily.    Calcium Carb-Cholecalciferol (CALCIUM + D3 PO) Take 1 capsule by mouth daily.     citalopram (CELEXA) 20 MG tablet TAKE 1 TABLET (20 MG TOTAL) BY MOUTH DAILY. Qty: 90 tablet, Refills: 1    PROVENTIL HFA 108 (90 BASE) MCG/ACT inhaler INHALE 2 PUFFS INTO THE LUNGS EVERY 6 (SIX) HOURS AS NEEDED FOR WHEEZING. Qty: 20.1 Inhaler, Refills: 2      STOP taking these medications     aspirin 81 MG tablet        No Known Allergies Follow-up Information    Follow up with Manus Rudd, MD.   Specialty:  Gastroenterology   Why:  His office will call you for the follow up.   Contact information:   [address] [place] [address] [phone]       Follow up with Sallee Lange, MD. Schedule an appointment as soon as possible for a visit in 1 week.   Specialty:  Family Medicine   Contact information:   [address] [place] [address] [phone]        The results of significant diagnostics from this hospitalization (including imaging, microbiology, ancillary and laboratory) are listed below for reference.    Significant Diagnostic Studies: Dg Ribs Unilateral W/chest Right  11/27/2015  CLINICAL DATA:  Right side rib pain. [place] [address] and hit right side on door frame 2 weeks ago. EXAM: RIGHT RIBS AND CHEST - 3+ VIEW COMPARISON:  11/09/2013 FINDINGS: No  visible rib fracture or pneumothorax. No effusion. There is hyperinflation of the lungs compatible with COPD. No confluent opacity. Heart is borderline in size. IMPRESSION: COPD. No active disease. No visible rib fracture or pneumothorax. Electronically Signed   By: Rolm Baptise M.D.   On: 11/27/2015 08:30   Dg Shoulder Right  11/27/2015  CLINICAL DATA:  Fall 2 weeks ago.  Right shoulder and rib pain. EXAM: RIGHT SHOULDER - 2+ VIEW COMPARISON:  None. FINDINGS: Mild degenerative changes in the right shoulder with joint space narrowing and spurring. No acute bony abnormality. Specifically, no fracture, subluxation, or dislocation. Soft tissues are intact. IMPRESSION: No acute bony abnormality. Electronically Signed   By: Rolm Baptise M.D.   On: 11/27/2015 08:33    Microbiology: No results found for this or any previous visit (from the past 240 hour(s)).   Labs: Basic Metabolic Panel:  Recent Labs Lab 11/24/15 [phone] 11/27/15 0734 11/28/15 0541  NA 138 138 141  K 4.3 4.4 3.8  CL 101 107 110  CO2 29 26 26   GLUCOSE 83 110* 97  BUN 12 44* 18  CREATININE 0.70 0.67 0.64  CALCIUM 9.9 8.3* 8.0*  PHOS 3.1  --   --    Liver Function Tests:  Recent Labs Lab 11/24/15 0907 11/27/15 0734  AST  --  17  ALT  --  12*  ALKPHOS  --  54  BILITOT  --  0.4  PROT  --  5.4*  ALBUMIN 4.1 3.2*    Recent Labs Lab 11/27/15 0734  LIPASE 24   No results for input(s): AMMONIA in the last 168 hours. CBC:  Recent Labs Lab 11/24/15 0906 11/27/15 0734 11/28/15 0541  WBC 6.3 5.6 6.2  NEUTROABS 3.7 3.8  --   HGB 12.4 8.1* 10.3*  HCT 38.3 24.6* 30.5*  MCV 91.4 90.8 89.2  PLT 230 185 158   Cardiac Enzymes:  Recent Labs Lab 11/27/15 0734  TROPONINI <0.03   BNP: BNP (last 3 results) No results for input(s): BNP in the last 8760 hours.  ProBNP (last 3 results) No results for input(s): PROBNP in the last 8760 hours.  CBG: No results for input(s): GLUCAP in the last 168  hours.     Signed:  Maddux First MD.  [facility] 11/28/2015, 11:38 AM

## 2015-11-30 ENCOUNTER — Telehealth: Payer: Self-pay

## 2015-11-30 ENCOUNTER — Encounter: Payer: Self-pay | Admitting: Internal Medicine

## 2015-11-30 NOTE — Telephone Encounter (Signed)
Per RMR-  Send letter to patient.  Send copy of letter with path to referring provider and PCP.   Needs office visit in 3 months to reassess and set up a follow-up EGD.

## 2015-11-30 NOTE — Telephone Encounter (Signed)
Letter mailed to the pt. 

## 2015-11-30 NOTE — Telephone Encounter (Signed)
Reminder in epic °

## 2015-12-01 ENCOUNTER — Encounter: Payer: Self-pay | Admitting: Family Medicine

## 2015-12-01 ENCOUNTER — Ambulatory Visit (INDEPENDENT_AMBULATORY_CARE_PROVIDER_SITE_OTHER): Payer: Medicare Other | Admitting: Family Medicine

## 2015-12-01 VITALS — BP 130/80 | Ht 60.0 in | Wt 95.5 lb

## 2015-12-01 DIAGNOSIS — K921 Melena: Secondary | ICD-10-CM | POA: Diagnosis not present

## 2015-12-01 DIAGNOSIS — R2681 Unsteadiness on feet: Secondary | ICD-10-CM

## 2015-12-01 DIAGNOSIS — D649 Anemia, unspecified: Secondary | ICD-10-CM

## 2015-12-01 DIAGNOSIS — K922 Gastrointestinal hemorrhage, unspecified: Secondary | ICD-10-CM

## 2015-12-01 DIAGNOSIS — R270 Ataxia, unspecified: Secondary | ICD-10-CM | POA: Insufficient documentation

## 2015-12-01 LAB — POCT HEMOGLOBIN: HEMOGLOBIN: 11 g/dL — AB (ref 12.2–16.2)

## 2015-12-01 NOTE — Progress Notes (Signed)
   Subjective:    Patient ID: Carol Massey, female    DOB: Aug 15, 1926, 82 y.o.   MRN: [id number]  HPI Patient is here for a hospital follow up visit. Patient was admitted on 11/27/15 to [facility] and diagnosed with an upper GI bleed.  Patient still has unsteady gait. Patient's son [name]) would like to have an aide come in and help patient with bathing in the mornings.   This patient had significant GI bleed she was seen on the 31st had a hemoglobin of 12 then 3 days later hemoglobin was 8 she had an EGD which showed ulcers but did not show active bleeding. The son was concerned that there could be other sources of bleeding. Her hemoglobin today is stable. She is having some problems with being unsteady and off balance. No vomiting no diarrhea no bloody stools. No abdominal pain. Appetite doing okay. Patient is a smoker. Chest x-ray looked okay Patient is too weak to do showers having a difficult time using the facilities at home. May need some modifications completed. Review of Systems See above.    Objective:   Physical Exam  Lungs are clear hearts regular abdomen soft no guarding rebound extremities no edema hemoglobin is noted patient was observed walking she has mild ataxia due to weakness  Face-to-face evaluation was to do was done today for the purpose of home health. Patient would benefit from having home health evaluate her and doing CBC next week plus also having physical therapy occupational therapy work with her to avoid potential for falls plus also assist her with modifications in the home to help her bathe. Patient may well benefit from having an aid to help her with bathing    Assessment & Plan:  GI bleed-apparently under good control currently check CBC next week through home health Hemoccult 3 next week Avoid all anti-inflammatories and aspirin If further troubles follow-up sooner Otherwise recheck in several weeks Home health consultation along with physical therapy occupational  therapy Will communicate with Dr. Sydell Axon to see if they feel any additional testing is necessary the best I can tell is that no further testing necessary except for follow-up EGD in 2-3 months.

## 2015-12-02 ENCOUNTER — Other Ambulatory Visit: Payer: Self-pay | Admitting: Family Medicine

## 2015-12-04 ENCOUNTER — Encounter: Payer: Self-pay | Admitting: Internal Medicine

## 2015-12-04 NOTE — Telephone Encounter (Signed)
Appt made and letter sent

## 2015-12-06 ENCOUNTER — Other Ambulatory Visit: Payer: Self-pay | Admitting: Family Medicine

## 2015-12-08 ENCOUNTER — Other Ambulatory Visit (HOSPITAL_COMMUNITY)
Admission: AD | Admit: 2015-12-08 | Discharge: 2015-12-08 | Disposition: A | Discharge status: Home / Self Care | Payer: Medicare Other | Source: Other Acute Inpatient Hospital | Attending: Family Medicine | Admitting: Family Medicine

## 2015-12-08 DIAGNOSIS — Z5181 Encounter for therapeutic drug level monitoring: Secondary | ICD-10-CM | POA: Diagnosis present

## 2015-12-08 LAB — POC HEMOCCULT BLD/STL (HOME/3-CARD/SCREEN)
Card #2 Fecal Occult Blod, POC: NEGATIVE
FECAL OCCULT BLD: NEGATIVE
FECAL OCCULT BLD: NEGATIVE

## 2015-12-08 LAB — CBC
HEMATOCRIT: 31.5 % — AB (ref 36.0–46.0)
HEMOGLOBIN: 10.1 g/dL — AB (ref 12.0–15.0)
MCH: 29.6 pg (ref 26.0–34.0)
MCHC: 32.1 g/dL (ref 30.0–36.0)
MCV: 92.4 fL (ref 78.0–100.0)
Platelets: 279 10*3/uL (ref 150–400)
RBC: 3.41 MIL/uL — ABNORMAL LOW (ref 3.87–5.11)
RDW: 17.1 % — AB (ref 11.5–15.5)
WBC: 5.5 10*3/uL (ref 4.0–10.5)

## 2015-12-19 DIAGNOSIS — K921 Melena: Secondary | ICD-10-CM | POA: Diagnosis not present

## 2015-12-19 DIAGNOSIS — K922 Gastrointestinal hemorrhage, unspecified: Secondary | ICD-10-CM | POA: Diagnosis not present

## 2015-12-19 DIAGNOSIS — R269 Unspecified abnormalities of gait and mobility: Secondary | ICD-10-CM | POA: Diagnosis not present

## 2015-12-19 DIAGNOSIS — D649 Anemia, unspecified: Secondary | ICD-10-CM | POA: Diagnosis not present

## 2015-12-22 ENCOUNTER — Ambulatory Visit (INDEPENDENT_AMBULATORY_CARE_PROVIDER_SITE_OTHER): Payer: Medicare Other | Admitting: Family Medicine

## 2015-12-22 VITALS — BP 118/78 | Ht 60.0 in | Wt 98.4 lb

## 2015-12-22 DIAGNOSIS — K922 Gastrointestinal hemorrhage, unspecified: Secondary | ICD-10-CM | POA: Diagnosis not present

## 2015-12-22 DIAGNOSIS — R2681 Unsteadiness on feet: Secondary | ICD-10-CM

## 2015-12-22 DIAGNOSIS — D649 Anemia, unspecified: Secondary | ICD-10-CM

## 2015-12-22 NOTE — Progress Notes (Signed)
   Subjective:    Patient ID: Carol Massey, female    DOB: 06-Sep-1925, 82 y.o.   MRN: [id number]  Hyperlipidemia This is a chronic problem. The current episode started more than 1 year ago. Current antihyperlipidemic treatment includes diet change. There are no compliance problems.  Risk factors for coronary artery disease include post-menopausal.   According to therapist the family states they've been told that she has some right-sided weakness a thing from possible previous stroke versus just progression of illness she is doing better she is eating better gaining some weight no sign of any GI bleed.   Review of Systems     Objective:   Physical Exam Lungs are clear hearts regular pulse normal extremities no edema       Assessment & Plan:  Gaining weight No sign of GI bleed Will need EGD follow-up in July Follow-up after that Probably restart 81 mg aspirin at that point Continue physical therapy occupational therapy as long as Medicare allows for it.

## 2016-01-24 ENCOUNTER — Encounter (HOSPITAL_COMMUNITY): Payer: Medicare Other | Attending: Oncology

## 2016-01-24 DIAGNOSIS — D500 Iron deficiency anemia secondary to blood loss (chronic): Secondary | ICD-10-CM | POA: Diagnosis present

## 2016-01-24 LAB — CBC WITH DIFFERENTIAL/PLATELET
BASOS ABS: 0.1 10*3/uL (ref 0.0–0.1)
Basophils Relative: 1 %
Eosinophils Absolute: 0.2 10*3/uL (ref 0.0–0.7)
Eosinophils Relative: 3 %
HEMATOCRIT: 24 % — AB (ref 36.0–46.0)
HEMOGLOBIN: 7.3 g/dL — AB (ref 12.0–15.0)
LYMPHS PCT: 26 %
Lymphs Abs: 1.3 10*3/uL (ref 0.7–4.0)
MCH: 25.1 pg — ABNORMAL LOW (ref 26.0–34.0)
MCHC: 30.4 g/dL (ref 30.0–36.0)
MCV: 82.5 fL (ref 78.0–100.0)
MONO ABS: 0.5 10*3/uL (ref 0.1–1.0)
Monocytes Relative: 9 %
Neutro Abs: 3.2 10*3/uL (ref 1.7–7.7)
Neutrophils Relative %: 61 %
Platelets: 280 10*3/uL (ref 150–400)
RBC: 2.91 MIL/uL — ABNORMAL LOW (ref 3.87–5.11)
RDW: 16.3 % — AB (ref 11.5–15.5)
WBC: 5.2 10*3/uL (ref 4.0–10.5)

## 2016-01-24 LAB — SAMPLE TO BLOOD BANK

## 2016-01-24 LAB — PREPARE RBC (CROSSMATCH)

## 2016-01-24 LAB — FERRITIN: Ferritin: 7 ng/mL — ABNORMAL LOW (ref 11–307)

## 2016-01-25 ENCOUNTER — Encounter (HOSPITAL_COMMUNITY): Payer: Medicare Other | Attending: Oncology

## 2016-01-25 ENCOUNTER — Encounter (HOSPITAL_COMMUNITY): Payer: Self-pay

## 2016-01-25 VITALS — BP 125/47 | HR 61 | Temp 98.2°F | Resp 16

## 2016-01-25 DIAGNOSIS — D500 Iron deficiency anemia secondary to blood loss (chronic): Secondary | ICD-10-CM | POA: Diagnosis not present

## 2016-01-25 DIAGNOSIS — Q2733 Arteriovenous malformation of digestive system vessel: Secondary | ICD-10-CM | POA: Diagnosis not present

## 2016-01-25 MED ORDER — SODIUM CHLORIDE 0.9 % IV SOLN
250.0000 mL | Freq: Once | INTRAVENOUS | Status: AC
  Administered 2016-01-25: 250 mL via INTRAVENOUS

## 2016-01-25 MED ORDER — DIPHENHYDRAMINE HCL 25 MG PO CAPS
25.0000 mg | ORAL_CAPSULE | Freq: Once | ORAL | Status: AC
  Administered 2016-01-25: 25 mg via ORAL

## 2016-01-25 MED ORDER — ACETAMINOPHEN 325 MG PO TABS
ORAL_TABLET | ORAL | Status: AC
  Filled 2016-01-25: qty 2

## 2016-01-25 MED ORDER — DIPHENHYDRAMINE HCL 25 MG PO CAPS
ORAL_CAPSULE | ORAL | Status: AC
  Filled 2016-01-25: qty 1

## 2016-01-25 MED ORDER — SODIUM CHLORIDE 0.9% FLUSH: 10.0000 mL | INTRAVENOUS | Status: AC | PRN

## 2016-01-25 MED ORDER — ACETAMINOPHEN 325 MG PO TABS
650.0000 mg | ORAL_TABLET | Freq: Once | ORAL | Status: AC
  Administered 2016-01-25: 650 mg via ORAL

## 2016-01-25 NOTE — Addendum Note (Signed)
Addended by: Joie Bimler on: 01/25/2016 09:13 AM   Modules accepted: Orders, Medications

## 2016-01-25 NOTE — Progress Notes (Signed)
1235:  Tolerated transfusion w/o adverse reaction.  VSS.  A&Ox4, in no distress.  Discharged ambulatory in c/o family for transport home.

## 2016-01-25 NOTE — Patient Instructions (Signed)
[place] at [facility] Discharge Instructions  RECOMMENDATIONS MADE BY THE CONSULTANT AND ANY TEST RESULTS WILL BE SENT TO YOUR REFERRING PHYSICIAN.  Today you received 2 units of blood. Follow up with Dr. Sydell Axon as scheduled or sooner. Return as scheduled for lab work and office visit. Call the clinic should you have any questions or concerns.   Thank you for choosing [place] at [facility] to provide your oncology and hematology care.  To afford each patient quality time with our provider, please arrive at least 15 minutes before your scheduled appointment time.   Beginning January 23rd 2017 lab work for the [facility] will be done in the  Main lab at [facility] on 1st floor. If you have a lab appointment with the [place] please come in thru the  Main Entrance and check in at the main information desk  You need to re-schedule your appointment should you arrive 10 or more minutes late.  We strive to give you quality time with our providers, and arriving late affects you and other patients whose appointments are after yours.  Also, if you no show three or more times for appointments you may be dismissed from the clinic at the providers discretion.     Again, thank you for choosing [facility].  Our hope is that these requests will decrease the amount of time that you wait before being seen by our physicians.       _____________________________________________________________  Should you have questions after your visit to [facility], please contact our office at [phone] between the hours of 8:30 a.m. and 4:30 p.m.  Voicemails left after 4:30 p.m. will not be returned until the following business day.  For prescription refill requests, have your pharmacy contact our office.         Resources For Cancer Patients and their Caregivers ? American Cancer Society: Can assist with transportation, wigs,  general needs, runs Look Good Feel Better.        321-207-4603 ? Cancer Care: Provides financial assistance, online support groups, medication/co-pay assistance.  1-800-813-HOPE 570-570-9283) ? [place] Assists [place] cancer patients and their families through emotional , educational and financial support.  838-355-0884 ? [place] DSS Where to apply for food stamps, Medicaid and utility assistance. 352-696-3553 ? [facility]: Transportation to medical appointments. 217-394-8527 ? Social Security Administration: May apply for disability if have a Stage IV cancer. 4380354641 213 620 6406 ? [employer], Disability and Transit Services: Assists with nutrition, care and transit needs. [place] Support Programs: @10RELATIVEDAYS @ > Cancer Support Group  2nd Tuesday of the month 1pm-2pm, Journey Room  > Creative Journey  3rd Tuesday of the month 1130am-1pm, Journey Room  > Look Good Feel Better  1st Wednesday of the month 10am-12 noon, Journey Room (Call [place] to register [phone])

## 2016-01-26 ENCOUNTER — Emergency Department (HOSPITAL_COMMUNITY)
Admission: EM | Admit: 2016-01-26 | Discharge: 2016-01-26 | Disposition: A | Discharge status: Home / Self Care | Payer: Medicare Other | Attending: Emergency Medicine | Admitting: Emergency Medicine

## 2016-01-26 ENCOUNTER — Encounter (HOSPITAL_COMMUNITY): Payer: Self-pay | Admitting: *Deleted

## 2016-01-26 ENCOUNTER — Emergency Department (HOSPITAL_COMMUNITY): Payer: Medicare Other

## 2016-01-26 ENCOUNTER — Telehealth: Payer: Self-pay | Admitting: Family Medicine

## 2016-01-26 ENCOUNTER — Other Ambulatory Visit: Payer: Self-pay

## 2016-01-26 DIAGNOSIS — F17210 Nicotine dependence, cigarettes, uncomplicated: Secondary | ICD-10-CM | POA: Insufficient documentation

## 2016-01-26 DIAGNOSIS — J449 Chronic obstructive pulmonary disease, unspecified: Secondary | ICD-10-CM | POA: Diagnosis not present

## 2016-01-26 DIAGNOSIS — Z8673 Personal history of transient ischemic attack (TIA), and cerebral infarction without residual deficits: Secondary | ICD-10-CM | POA: Insufficient documentation

## 2016-01-26 DIAGNOSIS — R4182 Altered mental status, unspecified: Secondary | ICD-10-CM | POA: Insufficient documentation

## 2016-01-26 DIAGNOSIS — D649 Anemia, unspecified: Secondary | ICD-10-CM

## 2016-01-26 DIAGNOSIS — R5383 Other fatigue: Secondary | ICD-10-CM | POA: Diagnosis present

## 2016-01-26 DIAGNOSIS — F325 Major depressive disorder, single episode, in full remission: Secondary | ICD-10-CM | POA: Diagnosis not present

## 2016-01-26 DIAGNOSIS — Z79891 Long term (current) use of opiate analgesic: Secondary | ICD-10-CM | POA: Diagnosis not present

## 2016-01-26 DIAGNOSIS — Z79899 Other long term (current) drug therapy: Secondary | ICD-10-CM | POA: Insufficient documentation

## 2016-01-26 DIAGNOSIS — I10 Essential (primary) hypertension: Secondary | ICD-10-CM | POA: Insufficient documentation

## 2016-01-26 HISTORY — DX: Unsteadiness on feet: R26.81

## 2016-01-26 LAB — URINALYSIS, ROUTINE W REFLEX MICROSCOPIC
BILIRUBIN URINE: NEGATIVE
Glucose, UA: NEGATIVE mg/dL
HGB URINE DIPSTICK: NEGATIVE
Ketones, ur: NEGATIVE mg/dL
Nitrite: NEGATIVE
Protein, ur: NEGATIVE mg/dL
SPECIFIC GRAVITY, URINE: 1.01 (ref 1.005–1.030)
pH: 6.5 (ref 5.0–8.0)

## 2016-01-26 LAB — CBC WITH DIFFERENTIAL/PLATELET
Basophils Absolute: 0.1 10*3/uL (ref 0.0–0.1)
Basophils Relative: 1 %
EOS ABS: 0.2 10*3/uL (ref 0.0–0.7)
EOS PCT: 3 %
HCT: 32.9 % — ABNORMAL LOW (ref 36.0–46.0)
Hemoglobin: 10.4 g/dL — ABNORMAL LOW (ref 12.0–15.0)
LYMPHS ABS: 1.3 10*3/uL (ref 0.7–4.0)
Lymphocytes Relative: 26 %
MCH: 26.5 pg (ref 26.0–34.0)
MCHC: 31.6 g/dL (ref 30.0–36.0)
MCV: 83.7 fL (ref 78.0–100.0)
MONO ABS: 0.4 10*3/uL (ref 0.1–1.0)
MONOS PCT: 8 %
Neutro Abs: 2.9 10*3/uL (ref 1.7–7.7)
Neutrophils Relative %: 62 %
PLATELETS: 249 10*3/uL (ref 150–400)
RBC: 3.93 MIL/uL (ref 3.87–5.11)
RDW: 15.9 % — ABNORMAL HIGH (ref 11.5–15.5)
WBC: 4.8 10*3/uL (ref 4.0–10.5)

## 2016-01-26 LAB — BASIC METABOLIC PANEL
Anion gap: 6 (ref 5–15)
BUN: 14 mg/dL (ref 6–20)
CHLORIDE: 103 mmol/L (ref 101–111)
CO2: 28 mmol/L (ref 22–32)
CREATININE: 0.86 mg/dL (ref 0.44–1.00)
Calcium: 9.2 mg/dL (ref 8.9–10.3)
GFR calc Af Amer: >60 mL/min (ref >60)
GFR, EST NON AFRICAN AMERICAN: 59 mL/min — AB (ref >60)
Glucose, Bld: 107 mg/dL — ABNORMAL HIGH (ref 65–99)
Potassium: 4 mmol/L (ref 3.5–5.1)
SODIUM: 137 mmol/L (ref 135–145)

## 2016-01-26 LAB — TYPE AND SCREEN
ABO/RH(D): O POS
Antibody Screen: NEGATIVE
UNIT DIVISION: 0
Unit division: 0

## 2016-01-26 LAB — URINE MICROSCOPIC-ADD ON

## 2016-01-26 LAB — TROPONIN I

## 2016-01-26 NOTE — ED Notes (Signed)
From CT via wheelchair

## 2016-01-26 NOTE — Discharge Instructions (Signed)
Take your usual prescriptions as previously directed.  Call your regular medical doctor tomorrow to schedule a follow up appointment within the next 2 to 3 days.  Return to the Emergency Department immediately sooner if worsening.

## 2016-01-26 NOTE — ED Provider Notes (Signed)
CSN: [id number]     Arrival date & time 01/26/16  1332 History   First MD Initiated Contact with Patient 01/26/16 1603     Chief Complaint  Patient presents with  . Fatigue     HPI Pt was seen at 1615. Per pt and her family, c/o gradual onset and persistence of constant generalized weakness for the past 2 days. Pt "fell asleep at the table" today while eating breakfast. Pt has hx of chronic anemia due to GIB, received 2 units PRBC's yesterday. Denies N/V/D, no abd pain, no CP/SOB, no syncope, no seizure, no visual changes, no focal motor weakness, no tingling/numbness in extremities, no ataxia, no slurred speech, no facial droop.     Past Medical History  Diagnosis Date  . Stroke ([place])   . Hypertension   . Osteoporosis   . COPD (chronic obstructive pulmonary disease) ([place])   . Anemia   . Hard of hearing   . Iron deficiency anemia due to chronic blood loss 05/27/2014  . Gastric AVM 2014  . Dieulafoy lesion of colon 2006  . Major depression in remission ([place]) 07/24/2015  . Generalized anxiety disorder 07/24/2015  . Upper GI bleed 11/27/2015  . PUD (peptic ulcer disease) 11/27/2015    Gastric and duodenal ulcer on EGD  . Unsteady gait    Past Surgical History  Procedure Laterality Date  . Appendectomy    . Abdominal hysterectomy    . Tonsillectomy    . Colonoscopy  03/2005    Dr. Gala Romney: rectal and rectosigmoid polyps removed  . Esophagogastroduodenoscopy  03/2005    Dr. Gala Romney: tiny distal esophageal erosions  . Small bowel capsule study  03/2005    Dr. Laural Golden: two small Dieulafoy lesions at ascending colon. tiney specks of blood at duodenal bulb but no identified lesions  . Colonoscopy with esophagogastroduodenoscopy (egd) N/A 08/12/2013    Dr. Gala Romney: colonoscopy with tubular adenoma, colonic diverticulosis. EGD with non-critical Schatzki's ring, non-manipulated, and gastric AVM with evidence of recent bleeding, s/p thermal sealing and hemostasis clip  . Esophagogastroduodenoscopy N/A  11/27/2015    Procedure: ESOPHAGOGASTRODUODENOSCOPY (EGD);  Surgeon: Daneil Dolin, MD;  [facility];  Service: Endoscopy;  Laterality: N/A;  WITH POSSIBLE DILATION  . Givens capsule study N/A 11/27/2015    Procedure: GIVENS CAPSULE STUDY;  Surgeon: Daneil Dolin, MD;  [facility];  Service: Endoscopy;  Laterality: N/A;  . Esophageal dilation  11/27/2015    Procedure: ESOPHAGEAL DILATION;  Surgeon: Daneil Dolin, MD;  [facility];  Service: Endoscopy;;   Family History  Problem Relation Age of Onset  . Hypertension Mother   . Leukemia Father 61  . Stroke Mother   . Colon cancer Neg Hx    Social History  Substance Use Topics  . Smoking status: Current Some Day Smoker -- 0.50 packs/day for 70 years    Types: Cigarettes  . Smokeless tobacco: Never Used     Comment: about 1/2 ppd  . Alcohol Use: No    Review of Systems ROS: Statement: All systems negative except as marked or noted in the HPI; Constitutional: Negative for fever and chills. +generalized fatigue.; ; Eyes: Negative for eye pain, redness and discharge. ; ; ENMT: Negative for ear pain, hoarseness, nasal congestion, sinus pressure and sore throat. ; ; Cardiovascular: Negative for chest pain, palpitations, diaphoresis, dyspnea and peripheral edema. ; ; Respiratory: Negative for cough, wheezing and stridor. ; ; Gastrointestinal: Negative for nausea, vomiting, diarrhea, abdominal  pain, blood in stool, hematemesis, jaundice and rectal bleeding. . ; ; Genitourinary: Negative for dysuria, flank pain and hematuria. ; ; Musculoskeletal: Negative for back pain and neck pain. Negative for swelling and trauma.; ; Skin: Negative for pruritus, rash, abrasions, blisters, bruising and skin lesion.; ; Neuro: +generalized weakness, "fell asleep at the table." Negative for headache, lightheadedness and neck stiffness. Negative for altered level of consciousness, altered mental status, extremity weakness, paresthesias,  involuntary movement, seizure and syncope.      Allergies  Review of patient's allergies indicates no known allergies.  Home Medications   Prior to Admission medications   Medication Sig Start Date End Date Taking? Authorizing Provider  alendronate (FOSAMAX) 70 MG tablet TAKE 1 TABLET BY MOUTH EVERY 7 DAYS. TAKE WITH A FULL GLASS OF WATER ONK AN EMPTY STOMACH 12/06/15  Yes [name], MD  ALPRAZolam (XANAX) 0.25 MG tablet TAKE 1 TABLET TWICE A DAY AS NEEDED Patient taking differently: 0.125 once every morning (one-half tablet) 06/08/15  Yes Scott A Luking, MD  ASCORBIC ACID PO Take 1 capsule by mouth every morning.    Yes Historical Provider, MD  Calcium Carb-Cholecalciferol (CALCIUM + D3 PO) Take 1 capsule by mouth every morning.    Yes Historical Provider, MD  citalopram (CELEXA) 20 MG tablet TAKE 1 TABLET (20 MG TOTAL) BY MOUTH DAILY. Patient taking differently: TAKE 1 TABLET (20 MG TOTAL) BY MOUTH DAILY IN THE MORNING 12/04/15  Yes [name], MD  HYDROcodone-acetaminophen (NORCO/VICODIN) 5-325 MG tablet Take 1 tablet by mouth every 4 (four) hours as needed for moderate pain or severe pain. 11/28/15  Yes Rexene Alberts, MD  pantoprazole (PROTONIX) 40 MG tablet Take 1 tablet (40 mg total) by mouth 2 (two) times daily before a meal. For treatment of the ulcer. 11/28/15  Yes Rexene Alberts, MD  PROVENTIL HFA 108 (90 BASE) MCG/ACT inhaler INHALE 2 PUFFS INTO THE LUNGS EVERY 6 (SIX) HOURS AS NEEDED FOR WHEEZING. 04/24/15  Yes Mikey Kirschner, MD   BP 172/66 mmHg  Pulse 56  Temp(Src) 98.2 F (36.8 C) (Oral)  Resp 17  Ht 4\' 11"  (1.499 m)  Wt 101 lb (45.813 kg)  BMI 20.39 kg/m2  SpO2 97%   16:52 Orthostatic Vital Signs CS  Orthostatic Lying  - BP- Lying: 172/66 mmHg ; Pulse- Lying: 56  Orthostatic Sitting - BP- Sitting: 155/82 mmHg ; Pulse- Sitting: 62  Orthostatic Standing at 0 minutes - BP- Standing at 0 minutes: 165/72 mmHg ; Pulse- Standing at 0 minutes: 61      Physical Exam   1620: Physical examination:  Nursing notes reviewed; Vital signs and O2 SAT reviewed;  Constitutional: Well developed, Well nourished, Well hydrated, In no acute distress; Head:  Normocephalic, atraumatic; Eyes: EOMI, PERRL, No scleral icterus; ENMT: Mouth and pharynx normal, Mucous membranes moist; Neck: Supple, Full range of motion, No lymphadenopathy; Cardiovascular: Regular rate and rhythm, No gallop; Respiratory: Breath sounds clear & equal bilaterally, No wheezes.  Speaking full sentences with ease, Normal respiratory effort/excursion; Chest: Nontender, Movement normal; Abdomen: Soft, Nontender, Nondistended, Normal bowel sounds; Genitourinary: No CVA tenderness; Extremities: Pulses normal, No tenderness, No edema, No calf edema or asymmetry.; Neuro: AA&Ox3, No facial droop. Major CN grossly intact.  Speech clear. Grips equal. Moves all extremities spontaneously and to command without apparent gross focal motor deficits.; Skin: Color pale, Warm, Dry.    ED Course  Procedures (including critical care time) Labs Review  Imaging Review  I have personally reviewed and  evaluated these images and lab results as part of my medical decision-making.   EKG Interpretation   Date/Time:  Friday January 26 2016 16:51:14 EDT Ventricular Rate:  59 PR Interval:  178 QRS Duration: 95 QT Interval:  431 QTC Calculation: 427 R Axis:   37 Text Interpretation:  Sinus rhythm When compared with ECG of 11/27/2015 No  significant change was found Confirmed by [facility]  MD, Nunzio Cory [phone]) on  01/26/2016 5:47:18 PM      MDM  MDM Reviewed: previous chart, nursing note and vitals Reviewed previous: labs and ECG Interpretation: labs, ECG, x-ray and CT scan     Results for orders placed or performed during the hospital encounter of 01/26/16  CBC with Differential  Result Value Ref Range   WBC 4.8 4.0 - 10.5 K/uL   RBC 3.93 3.87 - 5.11 MIL/uL   Hemoglobin 10.4 (L) 12.0 - 15.0 g/dL   HCT 32.9 (L) 36.0 -  46.0 %   MCV 83.7 78.0 - 100.0 fL   MCH 26.5 26.0 - 34.0 pg   MCHC 31.6 30.0 - 36.0 g/dL   RDW 15.9 (H) 11.5 - 15.5 %   Platelets 249 150 - 400 K/uL   Neutrophils Relative % 62 %   Neutro Abs 2.9 1.7 - 7.7 K/uL   Lymphocytes Relative 26 %   Lymphs Abs 1.3 0.7 - 4.0 K/uL   Monocytes Relative 8 %   Monocytes Absolute 0.4 0.1 - 1.0 K/uL   Eosinophils Relative 3 %   Eosinophils Absolute 0.2 0.0 - 0.7 K/uL   Basophils Relative 1 %   Basophils Absolute 0.1 0.0 - 0.1 K/uL  Basic metabolic panel  Result Value Ref Range   Sodium 137 135 - 145 mmol/L   Potassium 4.0 3.5 - 5.1 mmol/L   Chloride 103 101 - 111 mmol/L   CO2 28 22 - 32 mmol/L   Glucose, Bld 107 (H) 65 - 99 mg/dL   BUN 14 6 - 20 mg/dL   Creatinine, Ser 0.86 0.44 - 1.00 mg/dL   Calcium 9.2 8.9 - 10.3 mg/dL   GFR calc non Af Amer 59 (L) >60 mL/min   GFR calc Af Amer >60 >60 mL/min   Anion gap 6 5 - 15  Urinalysis, Routine w reflex microscopic  Result Value Ref Range   Color, Urine YELLOW YELLOW   APPearance CLEAR CLEAR   Specific Gravity, Urine 1.010 1.005 - 1.030   pH 6.5 5.0 - 8.0   Glucose, UA NEGATIVE NEGATIVE mg/dL   Hgb urine dipstick NEGATIVE NEGATIVE   Bilirubin Urine NEGATIVE NEGATIVE   Ketones, ur NEGATIVE NEGATIVE mg/dL   Protein, ur NEGATIVE NEGATIVE mg/dL   Nitrite NEGATIVE NEGATIVE   Leukocytes, UA TRACE (A) NEGATIVE  Troponin I  Result Value Ref Range   Troponin I <0.03 <0.031 ng/mL  Urine microscopic-add on  Result Value Ref Range   Squamous Epithelial / LPF 0-5 (A) NONE SEEN   WBC, UA 0-5 0 - 5 WBC/hpf   RBC / HPF 0-5 0 - 5 RBC/hpf   Bacteria, UA FEW (A) NONE SEEN   Dg Chest 2 View 01/26/2016  CLINICAL DATA:  Fatigue.  Weakness since this morning. EXAM: CHEST  2 VIEW COMPARISON:  11/27/2015 FINDINGS: There is cardiomegaly. No confluent airspace opacities, effusions or edema. Scarring at the right lung base. No acute bony abnormality. IMPRESSION: Cardiomegaly.  No active disease. Electronically Signed    By: Rolm Baptise M.D.   On: 01/26/2016 18:16  Ct Head Wo Contrast 01/26/2016  CLINICAL DATA:  Altered mental status EXAM: CT HEAD WITHOUT CONTRAST TECHNIQUE: Contiguous axial images were obtained from the base of the skull through the vertex without intravenous contrast. COMPARISON:  June 14, 2015 FINDINGS: Paranasal sinuses, mastoid air cells, bones, and extracranial soft tissues are normal. Cerebellum, brainstem, and basal cisterns are normal. Mild white matter changes are stable. No acute cortical ischemia or infarct. No mass, mass effect, or midline shift. No subdural, epidural, or subarachnoid hemorrhage. IMPRESSION: No acute abnormality. Electronically Signed   By: Dorise Bullion III M.D   On: 01/26/2016 20:26    2115:  H/H improved from previous. Pt not orthostatic on VS. Pt has tol PO well while in the ED without N/V.  No stooling while in the ED.  Abd remains benign, resps easy, neuro exam unchanged, VSS. Feels "ok" and wants to go home now; family states pt is acting per her baseline and would like to take her home now. Workup reassuring. Dx and testing d/w pt and family.  Questions answered.  Verb understanding, agreeable to d/c home with outpt f/u.    Francine Graven, DO 01/27/16 2051

## 2016-01-26 NOTE — ED Notes (Signed)
Pt got 2 units of blood yesterday here at the cancer center. Per son, pt has has been weak since this morning and states she fell asleep at the table while eating breakfast. Son states she has slept since then called Dr. Wolfgang Phoenix who suggested that she be brought here. Pt denies any pain is alert and oriented upon triage.

## 2016-01-26 NOTE — ED Notes (Signed)
Sipping sprite without any problem. Daughter at bedside and spouse. Spouse is also given sprite

## 2016-01-26 NOTE — Telephone Encounter (Signed)
Patient's son called stating that patient is received 2 units of blood on yesterday at the cancer center. Stated patient has been very sluggish and sleepy since receiving the transfusions on yesterday, with shallow breathing and clammy cool skin while sleeping.  Discussed patient symptoms with Dr.Steve Luking and was told to advise family to take patient to ER. Patient's son verbalized understanding.

## 2016-01-26 NOTE — ED Notes (Signed)
Without complaint, family at bedside- awaiting disposition

## 2016-01-30 LAB — URINE CULTURE: Culture: >=100000 — AB

## 2016-01-31 ENCOUNTER — Telehealth (HOSPITAL_BASED_OUTPATIENT_CLINIC_OR_DEPARTMENT_OTHER): Payer: Self-pay | Admitting: Emergency Medicine

## 2016-01-31 DIAGNOSIS — M810 Age-related osteoporosis without current pathological fracture: Secondary | ICD-10-CM

## 2016-01-31 NOTE — Telephone Encounter (Signed)
Post ED Visit - Positive Culture Follow-up  Culture report reviewed by antimicrobial stewardship pharmacist:  []  Elenor Quinones, Pharm.D. []  Heide Guile, Pharm.D., BCPS []  Parks Neptune, Pharm.D. []  Alycia Rossetti, Pharm.D., BCPS []  [place], Pharm.D., BCPS, AAHIVP []  [name], Pharm.D., BCPS, AAHIVP [x]  Milus Glazier, Pharm.D. []  Stephens November, Pharm.D.  Positive urine culture Treated with none, asymptomatic, no further patient follow-up is required at this time.  Hazle Nordmann 01/31/2016, 9:54 AM

## 2016-01-31 NOTE — Progress Notes (Signed)
ED Antimicrobial Stewardship Positive Culture Follow Up   Carol Massey is an 82 y.o. female who presented to [facility] on 01/26/2016 with a chief complaint of  Chief Complaint  Patient presents with  . Fatigue    Recent Results (from the past 720 hour(s))  Urine culture     Status: Abnormal   Collection Time: 01/26/16  6:19 PM  Result Value Ref Range Status   Specimen Description URINE, CATHETERIZED  Final   Special Requests NONE  Final   Culture >=100,000 COLONIES/mL ESCHERICHIA COLI (A)  Final   Report Status 01/30/2016 FINAL  Final   Organism ID, Bacteria ESCHERICHIA COLI (A)  Final      Susceptibility   Escherichia coli - MIC*    AMPICILLIN <=2 SENSITIVE Sensitive     CEFAZOLIN <=4 SENSITIVE Sensitive     CEFTRIAXONE <=1 SENSITIVE Sensitive     CIPROFLOXACIN <=0.25 SENSITIVE Sensitive     GENTAMICIN <=1 SENSITIVE Sensitive     IMIPENEM <=0.25 SENSITIVE Sensitive     NITROFURANTOIN <=16 SENSITIVE Sensitive     TRIMETH/SULFA <=20 SENSITIVE Sensitive     AMPICILLIN/SULBACTAM <=2 SENSITIVE Sensitive     PIP/TAZO <=4 SENSITIVE Sensitive     * >=100,000 COLONIES/mL ESCHERICHIA COLI     Pt without any urinary symptoms and UA negative. No treatment.  ED Provider: Hyman Bible, PA-C  Alany Borman L. Nicole Kindred, PharmD PGY2 Infectious Diseases Pharmacy Resident Pager: [phone] 01/31/2016 9:49 AM

## 2016-02-01 ENCOUNTER — Ambulatory Visit (HOSPITAL_COMMUNITY): Payer: Medicare Other

## 2016-02-02 ENCOUNTER — Encounter (HOSPITAL_BASED_OUTPATIENT_CLINIC_OR_DEPARTMENT_OTHER): Payer: Medicare Other

## 2016-02-02 VITALS — BP 130/52 | HR 58 | Temp 97.9°F | Resp 16 | Wt 99.8 lb

## 2016-02-02 DIAGNOSIS — D500 Iron deficiency anemia secondary to blood loss (chronic): Secondary | ICD-10-CM

## 2016-02-02 DIAGNOSIS — Q2733 Arteriovenous malformation of digestive system vessel: Secondary | ICD-10-CM | POA: Diagnosis not present

## 2016-02-02 MED ORDER — FERRIC CARBOXYMALTOSE 750 MG/15ML IV SOLN
Freq: Once | INTRAVENOUS | Status: AC
  Administered 2016-02-02: 12:00:00 via INTRAVENOUS
  Filled 2016-02-02: qty 15

## 2016-02-02 MED ORDER — SODIUM CHLORIDE 0.9 % IV SOLN
INTRAVENOUS | Status: DC
  Administered 2016-02-02: 12:00:00 via INTRAVENOUS

## 2016-02-12 ENCOUNTER — Encounter: Payer: Self-pay | Admitting: Gastroenterology

## 2016-02-12 ENCOUNTER — Ambulatory Visit (INDEPENDENT_AMBULATORY_CARE_PROVIDER_SITE_OTHER): Payer: Medicare Other | Admitting: Gastroenterology

## 2016-02-12 ENCOUNTER — Other Ambulatory Visit: Payer: Self-pay

## 2016-02-12 VITALS — BP 136/69 | HR 71 | Temp 98.6°F | Ht <= 58 in | Wt 99.0 lb

## 2016-02-12 DIAGNOSIS — K279 Peptic ulcer, site unspecified, unspecified as acute or chronic, without hemorrhage or perforation: Secondary | ICD-10-CM

## 2016-02-12 DIAGNOSIS — R1310 Dysphagia, unspecified: Secondary | ICD-10-CM

## 2016-02-12 DIAGNOSIS — D500 Iron deficiency anemia secondary to blood loss (chronic): Secondary | ICD-10-CM

## 2016-02-12 NOTE — Patient Instructions (Signed)
1. Please have your lab work done. We will call you with results. 2. Upper endoscopy with Dr. Gala Romney. He will stretch your esophagus if needed. Based on findings he may go ahead and place a capsule to evaluate your small intestines if recommended. See separate instructions.

## 2016-02-12 NOTE — Progress Notes (Signed)
Primary Care Physician:  Sallee Lange, MD  Primary Gastroenterologist:  Garfield Cornea, MD   Chief Complaint  Patient presents with  . Follow-up    HPI:  Carol Massey is a 82 y.o. female here For hospital follow-up. Back in April she presented with melena and had a significant drop in her hemoglobin from 12 down to 8. 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 Gastric biopsies were benign. Last colonoscopy was in 2014. I felt there was enough information to explain her drop in hemoglobin and melena at the time. She had this appointment today scheduled for follow-up 3 month surveillance EGD.  Unfortunately in the interim she has required additional units of blood. Her hemoglobin dropped from 10 back down to 7.3. She received 2 units of packed red blood cells on June 1 and also received iron infusion on June 9. Patient denies overt GI bleeding. There is been no report of abdominal pain, appetite is been stable. No weight loss. She continues to have some issues with swallowing both solid food and liquids. She has 2-3 stools daily. No reported aspirin or NSAIDs at this time.  She has a history of iron deficiency anemia related to GI bleeding in the past. Back in 2014 she had gastric AVM with evidence of recent bleeding requiring thermal sealing and hemostasis clip. In 2006 she had a capsule endoscopy with 2 Dieulafoy lesions (never treated) at the ascending colon and specks of blood at the duodenal but without obvious lesion. As above last colonoscopy in 2014 and she had a tubular adenoma removed.  Current Outpatient Prescriptions  Medication Sig Dispense Refill  . alendronate (FOSAMAX) 70 MG tablet TAKE 1 TABLET BY MOUTH EVERY 7 DAYS. TAKE WITH A FULL GLASS OF WATER ONK AN EMPTY STOMACH 12 tablet 0  . ALPRAZolam (XANAX) 0.25 MG tablet TAKE 1 TABLET TWICE A DAY AS NEEDED  (Patient taking differently: 0.125 once every morning (one-half tablet)) 45 tablet 5  . ASCORBIC ACID PO Take 1 capsule by mouth every morning.     . Calcium Carb-Cholecalciferol (CALCIUM + D3 PO) Take 1 capsule by mouth every morning.     . citalopram (CELEXA) 20 MG tablet TAKE 1 TABLET (20 MG TOTAL) BY MOUTH DAILY. (Patient taking differently: TAKE 1 TABLET (20 MG TOTAL) BY MOUTH DAILY IN THE MORNING) 90 tablet 1  . HYDROcodone-acetaminophen (NORCO/VICODIN) 5-325 MG tablet Take 1 tablet by mouth every 4 (four) hours as needed for moderate pain or severe pain. 30 tablet 0  . pantoprazole (PROTONIX) 40 MG tablet Take 1 tablet (40 mg total) by mouth 2 (two) times daily before a meal. For treatment of the ulcer. 60 tablet 6  . PROVENTIL HFA 108 (90 BASE) MCG/ACT inhaler INHALE 2 PUFFS INTO THE LUNGS EVERY 6 (SIX) HOURS AS NEEDED FOR WHEEZING. 20.1 Inhaler 2   No current facility-administered medications for this visit.   Facility-Administered Medications Ordered in Other Visits  Medication Dose Route Frequency Provider Last Rate Last Dose  . sodium chloride flush (NS) 0.9 % injection 10 mL  10 mL Intracatheter PRN Baird Cancer, PA-C        Allergies as of 02/12/2016  . (No Known Allergies)    Past Medical History  Diagnosis Date  . Stroke ([place])   . Hypertension   . Osteoporosis   . COPD (chronic obstructive pulmonary disease) ([place])   .  Anemia   . Hard of hearing   . Iron deficiency anemia due to chronic blood loss 05/27/2014  . Gastric AVM 2014  . Dieulafoy lesion of colon 2006  . Major depression in remission ([place]) 07/24/2015  . Generalized anxiety disorder 07/24/2015  . Upper GI bleed 11/27/2015  . PUD (peptic ulcer disease) 11/27/2015    Gastric and duodenal ulcer on EGD  . Unsteady gait     Past Surgical History  Procedure Laterality Date  . Appendectomy    . Abdominal hysterectomy    . Tonsillectomy    . Colonoscopy  03/2005    Dr. Gala Romney: rectal and rectosigmoid polyps  removed  . Esophagogastroduodenoscopy  03/2005    Dr. Gala Romney: tiny distal esophageal erosions  . Small bowel capsule study  03/2005    Dr. Laural Golden: two small Dieulafoy lesions at ascending colon. tiney specks of blood at duodenal bulb but no identified lesions  . Colonoscopy with esophagogastroduodenoscopy (egd) N/A 08/12/2013    Dr. Gala Romney: colonoscopy with tubular adenoma, colonic diverticulosis. EGD with non-critical Schatzki's ring, non-manipulated, and gastric AVM with evidence of recent bleeding, s/p thermal sealing and hemostasis clip  . Esophagogastroduodenoscopy N/A 11/27/2015    RMR: Cricopharyngeus web, status post dilation. Nonbleeding duodenal and gastric ulcers without stigmata of bleeding. Gastric biopsies benign  . Givens capsule study N/A 11/27/2015    NOT DONE, WAS NOT INDICATED AT THE TIME  . Esophageal dilation  11/27/2015    Procedure: ESOPHAGEAL DILATION;  Surgeon: Daneil Dolin, MD;  [facility];  Service: Endoscopy;;    Family History  Problem Relation Age of Onset  . Hypertension Mother   . Leukemia Father 1  . Stroke Mother   . Colon cancer Neg Hx     Social History   Social History  . Marital Status: Married    Spouse Name: N/A  . Number of Children: 3  . Years of Education: N/A   Occupational History  . retired from [employer] History Main Topics  . Smoking status: Former Smoker -- 0.50 packs/day for 70 years    Types: Cigarettes  . Smokeless tobacco: Never Used     Comment: about 1/2 ppd  . Alcohol Use: No  . Drug Use: No  . Sexual Activity: Not on file   Other Topics Concern  . Not on file   Social History Narrative      ROS:  General: Negative for anorexia, weight loss, fever, chills, fatigue, weakness. Eyes: Negative for vision changes.  ENT: Negative for hoarseness, difficulty swallowing , nasal congestion.Heart of hearing. CV: Negative for chest pain, angina, palpitations, dyspnea on exertion, peripheral edema.   Respiratory: Negative for dyspnea at rest, dyspnea on exertion, cough, sputum, wheezing.  GI: See history of present illness. GU:  Negative for dysuria, hematuria, urinary incontinence, urinary frequency, nocturnal urination.  MS: Negative for joint pain, low back pain.  Derm: Negative for rash or itching.  Neuro: Negative for weakness, abnormal sensation, seizure, frequent headaches, memory loss, confusion.Unsteady gait.  Psych: Negative for anxiety, depression, suicidal ideation, hallucinations.  Endo: Negative for unusual weight change.  Heme: Negative for bruising or bleeding. Allergy: Negative for rash or hives.    Physical Examination:  BP 136/69 mmHg  Pulse 71  Temp(Src) 98.6 F (37 C)  Ht 4\' 10"  (1.473 m)  Wt 99 lb (44.906 kg)  BMI 20.70 kg/m2   General: Well-nourished, well-developed in no acute distress. Accompanied by husband and son. Hard  of hearing. Head: Normocephalic, atraumatic.   Eyes: Conjunctiva pink, no icterus. Mouth: Oropharyngeal mucosa moist and pink , no lesions erythema or exudate. Neck: Supple without thyromegaly, masses, or lymphadenopathy.  Lungs: Clear to auscultation bilaterally.  Heart: Regular rate and rhythm, no murmurs rubs or gallops.  Abdomen: Bowel sounds are normal, nontender, nondistended, no hepatosplenomegaly or masses, no abdominal bruits or    hernia , no rebound or guarding.   Rectal: Not performed Extremities: No lower extremity edema. No clubbing or deformities.  Neuro: Alert and oriented x 4 , grossly normal neurologically.  Skin: Warm and dry, no rash or jaundice.   Psych: Alert and cooperative, normal mood and affect.  Labs: Lab Results  Component Value Date   CREATININE 0.86 01/26/2016   BUN 14 01/26/2016   NA 137 01/26/2016   K 4.0 01/26/2016   CL 103 01/26/2016   CO2 28 01/26/2016   Lab Results  Component Value Date   WBC 4.8 01/26/2016   HGB 10.4* 01/26/2016   HCT 32.9* 01/26/2016   MCV 83.7 01/26/2016   PLT  249 01/26/2016   Lab Results  Component Value Date   ALT 12* 11/27/2015   AST 17 11/27/2015   ALKPHOS 54 11/27/2015   BILITOT 0.4 11/27/2015   Lab Results  Component Value Date   IRON 51 11/24/2015   TIBC 370 11/24/2015   FERRITIN 7* 01/24/2016     Imaging Studies: Dg Chest 2 View  01/26/2016  CLINICAL DATA:  Fatigue.  Weakness since this morning. EXAM: CHEST  2 VIEW COMPARISON:  11/27/2015 FINDINGS: There is cardiomegaly. No confluent airspace opacities, effusions or edema. Scarring at the right lung base. No acute bony abnormality. IMPRESSION: Cardiomegaly.  No active disease. Electronically Signed   By: Rolm Baptise M.D.   On: 01/26/2016 18:16   Ct Head Wo Contrast  01/26/2016  CLINICAL DATA:  Altered mental status EXAM: CT HEAD WITHOUT CONTRAST TECHNIQUE: Contiguous axial images were obtained from the base of the skull through the vertex without intravenous contrast. COMPARISON:  June 14, 2015 FINDINGS: Paranasal sinuses, mastoid air cells, bones, and extracranial soft tissues are normal. Cerebellum, brainstem, and basal cisterns are normal. Mild white matter changes are stable. No acute cortical ischemia or infarct. No mass, mass effect, or midline shift. No subdural, epidural, or subarachnoid hemorrhage. IMPRESSION: No acute abnormality. Electronically Signed   By: Dorise Bullion III M.D   On: 01/26/2016 20:26

## 2016-02-12 NOTE — Progress Notes (Signed)
cc'ed to pcp °

## 2016-02-12 NOTE — Assessment & Plan Note (Addendum)
82 year old female who presents for surveillance EGD for history of gastric ulcer detected at time of hospitalization earlier this year. Presented with melena and significant change in hemoglobin at that time. Since we last saw her she had another drop her hemoglobin from 10 down to 7 requiring 2 additional units of blood and iron infusion. 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 She has a history of AVMs in the past. Last colonoscopy 2014. 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  At this time recommend EGD with dilation with Dr. Gala Romney. Discussed with patient and her family, if EGD is unrevealing as far as source for recurrent drop in her hemoglobin, would consider small bowel capsule endoscopy in the near future. Patient's son also requested that I check her hemoglobin today which is reasonable.  I have discussed the risks, alternatives, benefits with regards to but not limited to the risk of reaction to medication, bleeding, infection, perforation and the patient is agreeable to proceed. Written consent to be obtained.

## 2016-02-13 LAB — CBC WITH DIFFERENTIAL/PLATELET
Basophils Absolute: 0.1 10*3/uL (ref 0.0–0.2)
Basos: 2 %
EOS (ABSOLUTE): 0.1 10*3/uL (ref 0.0–0.4)
EOS: 2 %
HEMATOCRIT: 36.5 % (ref 34.0–46.6)
HEMOGLOBIN: 11.2 g/dL (ref 11.1–15.9)
Immature Grans (Abs): 0 10*3/uL (ref 0.0–0.1)
Immature Granulocytes: 0 %
LYMPHS ABS: 1.7 10*3/uL (ref 0.7–3.1)
Lymphs: 27 %
MCH: 26.5 pg — AB (ref 26.6–33.0)
MCHC: 30.7 g/dL — AB (ref 31.5–35.7)
MCV: 87 fL (ref 79–97)
MONOCYTES: 7 %
Monocytes Absolute: 0.5 10*3/uL (ref 0.1–0.9)
NEUTROS ABS: 3.8 10*3/uL (ref 1.4–7.0)
Neutrophils: 62 %
Platelets: 303 10*3/uL (ref 150–379)
RBC: 4.22 x10E6/uL (ref 3.77–5.28)
RDW: 19.2 % — ABNORMAL HIGH (ref 12.3–15.4)
WBC: 6.2 10*3/uL (ref 3.4–10.8)

## 2016-02-13 NOTE — Progress Notes (Signed)
Quick Note:  Please let patient's son know that her Hgb is low NORMAL now. Continue with plan as outlined per OV. ______

## 2016-02-14 ENCOUNTER — Encounter (HOSPITAL_COMMUNITY): Payer: Self-pay | Admitting: *Deleted

## 2016-02-14 ENCOUNTER — Encounter (HOSPITAL_COMMUNITY)
Admission: RE | Disposition: A | Discharge status: Home / Self Care | Payer: Self-pay | Source: Ambulatory Visit | Attending: Internal Medicine

## 2016-02-14 ENCOUNTER — Ambulatory Visit (HOSPITAL_COMMUNITY)
Admission: RE | Admit: 2016-02-14 | Discharge: 2016-02-14 | Disposition: A | Discharge status: Home / Self Care | Payer: Medicare Other | Source: Ambulatory Visit | Attending: Internal Medicine | Admitting: Internal Medicine

## 2016-02-14 DIAGNOSIS — Z79899 Other long term (current) drug therapy: Secondary | ICD-10-CM | POA: Diagnosis not present

## 2016-02-14 DIAGNOSIS — Z8673 Personal history of transient ischemic attack (TIA), and cerebral infarction without residual deficits: Secondary | ICD-10-CM | POA: Insufficient documentation

## 2016-02-14 DIAGNOSIS — Z7983 Long term (current) use of bisphosphonates: Secondary | ICD-10-CM | POA: Insufficient documentation

## 2016-02-14 DIAGNOSIS — R1310 Dysphagia, unspecified: Secondary | ICD-10-CM

## 2016-02-14 DIAGNOSIS — I10 Essential (primary) hypertension: Secondary | ICD-10-CM | POA: Diagnosis not present

## 2016-02-14 DIAGNOSIS — M810 Age-related osteoporosis without current pathological fracture: Secondary | ICD-10-CM | POA: Insufficient documentation

## 2016-02-14 DIAGNOSIS — K449 Diaphragmatic hernia without obstruction or gangrene: Secondary | ICD-10-CM | POA: Insufficient documentation

## 2016-02-14 DIAGNOSIS — F17210 Nicotine dependence, cigarettes, uncomplicated: Secondary | ICD-10-CM | POA: Insufficient documentation

## 2016-02-14 DIAGNOSIS — F411 Generalized anxiety disorder: Secondary | ICD-10-CM | POA: Diagnosis not present

## 2016-02-14 DIAGNOSIS — Q394 Esophageal web: Secondary | ICD-10-CM

## 2016-02-14 DIAGNOSIS — Z8711 Personal history of peptic ulcer disease: Secondary | ICD-10-CM | POA: Insufficient documentation

## 2016-02-14 DIAGNOSIS — K279 Peptic ulcer, site unspecified, unspecified as acute or chronic, without hemorrhage or perforation: Secondary | ICD-10-CM

## 2016-02-14 HISTORY — PX: ESOPHAGOGASTRODUODENOSCOPY: SHX5428

## 2016-02-14 HISTORY — PX: MALONEY DILATION: SHX5535

## 2016-02-14 SURGERY — EGD (ESOPHAGOGASTRODUODENOSCOPY)
Anesthesia: Moderate Sedation

## 2016-02-14 MED ORDER — SODIUM CHLORIDE 0.9 % IV SOLN
INTRAVENOUS | Status: DC
  Administered 2016-02-14: 1000 mL via INTRAVENOUS

## 2016-02-14 MED ORDER — STERILE WATER FOR IRRIGATION IR SOLN
Status: DC | PRN
  Administered 2016-02-14: 16:00:00

## 2016-02-14 MED ORDER — MEPERIDINE HCL 100 MG/ML IJ SOLN
INTRAMUSCULAR | Status: DC | PRN
  Administered 2016-02-14 (×2): 25 mg via INTRAVENOUS

## 2016-02-14 MED ORDER — ONDANSETRON HCL 4 MG/2ML IJ SOLN
INTRAMUSCULAR | Status: AC
  Filled 2016-02-14: qty 2

## 2016-02-14 MED ORDER — MEPERIDINE HCL 100 MG/ML IJ SOLN
INTRAMUSCULAR | Status: AC
  Filled 2016-02-14: qty 2

## 2016-02-14 MED ORDER — LIDOCAINE VISCOUS 2 % MT SOLN
OROMUCOSAL | Status: AC
  Filled 2016-02-14: qty 15

## 2016-02-14 MED ORDER — MIDAZOLAM HCL 5 MG/5ML IJ SOLN
INTRAMUSCULAR | Status: AC
  Filled 2016-02-14: qty 10

## 2016-02-14 MED ORDER — LIDOCAINE VISCOUS 2 % MT SOLN
OROMUCOSAL | Status: DC | PRN
  Administered 2016-02-14: 6 mL via OROMUCOSAL

## 2016-02-14 MED ORDER — ONDANSETRON HCL 4 MG/2ML IJ SOLN
INTRAMUSCULAR | Status: DC | PRN
  Administered 2016-02-14: 4 mg via INTRAVENOUS

## 2016-02-14 MED ORDER — MIDAZOLAM HCL 5 MG/5ML IJ SOLN
INTRAMUSCULAR | Status: DC | PRN
  Administered 2016-02-14 (×2): 1 mg via INTRAVENOUS

## 2016-02-14 NOTE — Op Note (Addendum)
[facility] Patient Name: Carol Massey Procedure Date: 02/14/2016 4:06 PM MRN: [id number] Date of Birth: [birth year] Attending MD: Norvel Richards , MD CSN: [id number] Age: 82 Admit Type: Outpatient Procedure:                Upper GI endoscopy with Venia Minks dilation Indications:              Dysphagia Providers:                Norvel Richards, MD, Hinton Rao, RN, Georgeann Oppenheim, Technician Referring MD:              Medicines:                Midazolam 2 mg IV, Meperidine 50 mg IV, Ondansetron                            4 mg IV Complications:            No immediate complications. Estimated Blood Loss:     Estimated blood loss: none. Procedure:                Pre-Anesthesia Assessment:                           - Prior to the procedure, a History and Physical                            was performed, and patient medications and                            allergies were reviewed. The patient's tolerance of                            previous anesthesia was also reviewed. The risks                            and benefits of the procedure and the sedation                            options and risks were discussed with the patient.                            All questions were answered, and informed consent                            was obtained. Prior Anticoagulants: The patient has                            taken no previous anticoagulant or antiplatelet                            agents. ASA Grade Assessment: III - A patient with  severe systemic disease. After reviewing the risks                            and benefits, the patient was deemed in                            satisfactory condition to undergo the procedure.                           After obtaining informed consent, the endoscope was                            passed under direct vision. Throughout the                            procedure, the patient's  blood pressure, pulse, and                            oxygen saturations were monitored continuously. The                            [id number]) scope was introduced through the                            mouth, and advanced to the second part of duodenum.                            The upper GI endoscopy was accomplished without                            difficulty. The patient tolerated the procedure                            well. The patient tolerated the procedure well. Scope In: 4:20:22 PM Scope Out: 4:25:52 PM Total Procedure Duration: 0 hours 5 minutes 30 seconds  Findings:      A web was found in the upper third of the esophagus. The scope was       withdrawn. Dilation was performed with a Maloney dilator with no       resistance at 22 Fr. The dilation site was examined following endoscope       reinsertion and showed moderate improvement in luminal narrowing.       Estimated blood loss was minimal.      A small hiatal hernia was present. Previously noted gastric and duodenal       ulcers completely healed.      The second portion of the duodenum was normal. Impression:               - Web in the upper third of the esophagus. Dilated.                           - Small hiatal hernia.                           - Normal second portion of the  duodenum. completely                            healed peptic ulcer disease.                           - No specimens collected. Moderate Sedation:      Moderate (conscious) sedation was administered by the endoscopy nurse       and supervised by the endoscopist. The following parameters were       monitored: oxygen saturation, heart rate, blood pressure, respiratory       rate, EKG, adequacy of pulmonary ventilation, and response to care.       Total physician intraservice time was 14 minutes. Recommendation:           - Patient has a contact number available for                            emergencies. The signs and symptoms of  potential                            delayed complications were discussed with the                            patient. Return to normal activities tomorrow.                            Written discharge instructions were provided to the                            patient.                           - Advance diet as tolerated.                           - Continue present medications. Decrease Protonix                            to 40 mg once daily. Patient has difficulties with                            Fosamax from time to time. Given esophageal web,                            her bisphosphonate therapy should be changed to the                            parenteral route                           - No repeat upper endoscopy.                           - Return to GI office in 6 weeks. CBC in 4 weeks. Procedure Code(s):        --- Professional ---  [id number], Esophagogastroduodenoscopy, flexible,                            transoral; diagnostic, including collection of                            specimen(s) by brushing or washing, when performed                            (separate procedure)                           43450, Dilation of esophagus, by unguided sound or                            bougie, single or multiple passes Diagnosis Code(s):        --- Professional ---                           Q39.4, Esophageal web                           K44.9, Diaphragmatic hernia without obstruction or                            gangrene CPT copyright 2016 American Medical Association. All rights reserved. The codes documented in this report are preliminary and upon coder review may  be revised to meet current compliance requirements. [name]. [name], MD Norvel Richards, MD 02/14/2016 4:40:12 PM This report has been signed electronically. Number of Addenda: 0

## 2016-02-14 NOTE — H&P (View-Only) (Signed)
Primary Care Physician:  Sallee Lange, MD  Primary Gastroenterologist:  Garfield Cornea, MD   Chief Complaint  Patient presents with  . Follow-up    HPI:  Carol Massey is a 82 y.o. female here For hospital follow-up. Back in April she presented with melena and had a significant drop in her hemoglobin from 12 down to 8. 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 Gastric biopsies were benign. Last colonoscopy was in 2014. I felt there was enough information to explain her drop in hemoglobin and melena at the time. She had this appointment today scheduled for follow-up 3 month surveillance EGD.  Unfortunately in the interim she has required additional units of blood. Her hemoglobin dropped from 10 back down to 7.3. She received 2 units of packed red blood cells on June 1 and also received iron infusion on June 9. Patient denies overt GI bleeding. There is been no report of abdominal pain, appetite is been stable. No weight loss. She continues to have some issues with swallowing both solid food and liquids. She has 2-3 stools daily. No reported aspirin or NSAIDs at this time.  She has a history of iron deficiency anemia related to GI bleeding in the past. Back in 2014 she had gastric AVM with evidence of recent bleeding requiring thermal sealing and hemostasis clip. In 2006 she had a capsule endoscopy with 2 Dieulafoy lesions (never treated) at the ascending colon and specks of blood at the duodenal but without obvious lesion. As above last colonoscopy in 2014 and she had a tubular adenoma removed.  Current Outpatient Prescriptions  Medication Sig Dispense Refill  . alendronate (FOSAMAX) 70 MG tablet TAKE 1 TABLET BY MOUTH EVERY 7 DAYS. TAKE WITH A FULL GLASS OF WATER ONK AN EMPTY STOMACH 12 tablet 0  . ALPRAZolam (XANAX) 0.25 MG tablet TAKE 1 TABLET TWICE A DAY AS NEEDED  (Patient taking differently: 0.125 once every morning (one-half tablet)) 45 tablet 5  . ASCORBIC ACID PO Take 1 capsule by mouth every morning.     . Calcium Carb-Cholecalciferol (CALCIUM + D3 PO) Take 1 capsule by mouth every morning.     . citalopram (CELEXA) 20 MG tablet TAKE 1 TABLET (20 MG TOTAL) BY MOUTH DAILY. (Patient taking differently: TAKE 1 TABLET (20 MG TOTAL) BY MOUTH DAILY IN THE MORNING) 90 tablet 1  . HYDROcodone-acetaminophen (NORCO/VICODIN) 5-325 MG tablet Take 1 tablet by mouth every 4 (four) hours as needed for moderate pain or severe pain. 30 tablet 0  . pantoprazole (PROTONIX) 40 MG tablet Take 1 tablet (40 mg total) by mouth 2 (two) times daily before a meal. For treatment of the ulcer. 60 tablet 6  . PROVENTIL HFA 108 (90 BASE) MCG/ACT inhaler INHALE 2 PUFFS INTO THE LUNGS EVERY 6 (SIX) HOURS AS NEEDED FOR WHEEZING. 20.1 Inhaler 2   No current facility-administered medications for this visit.   Facility-Administered Medications Ordered in Other Visits  Medication Dose Route Frequency Provider Last Rate Last Dose  . sodium chloride flush (NS) 0.9 % injection 10 mL  10 mL Intracatheter PRN Baird Cancer, PA-C        Allergies as of 02/12/2016  . (No Known Allergies)    Past Medical History  Diagnosis Date  . Stroke ([place])   . Hypertension   . Osteoporosis   . COPD (chronic obstructive pulmonary disease) ([place])   .  Anemia   . Hard of hearing   . Iron deficiency anemia due to chronic blood loss 05/27/2014  . Gastric AVM 2014  . Dieulafoy lesion of colon 2006  . Major depression in remission ([place]) 07/24/2015  . Generalized anxiety disorder 07/24/2015  . Upper GI bleed 11/27/2015  . PUD (peptic ulcer disease) 11/27/2015    Gastric and duodenal ulcer on EGD  . Unsteady gait     Past Surgical History  Procedure Laterality Date  . Appendectomy    . Abdominal hysterectomy    . Tonsillectomy    . Colonoscopy  03/2005    Dr. Gala Romney: rectal and rectosigmoid polyps  removed  . Esophagogastroduodenoscopy  03/2005    Dr. Gala Romney: tiny distal esophageal erosions  . Small bowel capsule study  03/2005    Dr. Laural Golden: two small Dieulafoy lesions at ascending colon. tiney specks of blood at duodenal bulb but no identified lesions  . Colonoscopy with esophagogastroduodenoscopy (egd) N/A 08/12/2013    Dr. Gala Romney: colonoscopy with tubular adenoma, colonic diverticulosis. EGD with non-critical Schatzki's ring, non-manipulated, and gastric AVM with evidence of recent bleeding, s/p thermal sealing and hemostasis clip  . Esophagogastroduodenoscopy N/A 11/27/2015    RMR: Cricopharyngeus web, status post dilation. Nonbleeding duodenal and gastric ulcers without stigmata of bleeding. Gastric biopsies benign  . Givens capsule study N/A 11/27/2015    NOT DONE, WAS NOT INDICATED AT THE TIME  . Esophageal dilation  11/27/2015    Procedure: ESOPHAGEAL DILATION;  Surgeon: Daneil Dolin, MD;  Location: [facility];  Service: Endoscopy;;    Family History  Problem Relation Age of Onset  . Hypertension Mother   . Leukemia Father 56  . Stroke Mother   . Colon cancer Neg Hx     Social History   Social History  . Marital Status: Married    Spouse Name: N/A  . Number of Children: 3  . Years of Education: N/A   Occupational History  . retired from [employer] History Main Topics  . Smoking status: Former Smoker -- 0.50 packs/day for 70 years    Types: Cigarettes  . Smokeless tobacco: Never Used     Comment: about 1/2 ppd  . Alcohol Use: No  . Drug Use: No  . Sexual Activity: Not on file   Other Topics Concern  . Not on file   Social History Narrative      ROS:  General: Negative for anorexia, weight loss, fever, chills, fatigue, weakness. Eyes: Negative for vision changes.  ENT: Negative for hoarseness, difficulty swallowing , nasal congestion.Heart of hearing. CV: Negative for chest pain, angina, palpitations, dyspnea on exertion, peripheral edema.   Respiratory: Negative for dyspnea at rest, dyspnea on exertion, cough, sputum, wheezing.  GI: See history of present illness. GU:  Negative for dysuria, hematuria, urinary incontinence, urinary frequency, nocturnal urination.  MS: Negative for joint pain, low back pain.  Derm: Negative for rash or itching.  Neuro: Negative for weakness, abnormal sensation, seizure, frequent headaches, memory loss, confusion.Unsteady gait.  Psych: Negative for anxiety, depression, suicidal ideation, hallucinations.  Endo: Negative for unusual weight change.  Heme: Negative for bruising or bleeding. Allergy: Negative for rash or hives.    Physical Examination:  BP 136/69 mmHg  Pulse 71  Temp(Src) 98.6 F (37 C)  Ht 4\' 10"  (1.473 m)  Wt 99 lb (44.906 kg)  BMI 20.70 kg/m2   General: Well-nourished, well-developed in no acute distress. Accompanied by husband and son. Hard  of hearing. Head: Normocephalic, atraumatic.   Eyes: Conjunctiva pink, no icterus. Mouth: Oropharyngeal mucosa moist and pink , no lesions erythema or exudate. Neck: Supple without thyromegaly, masses, or lymphadenopathy.  Lungs: Clear to auscultation bilaterally.  Heart: Regular rate and rhythm, no murmurs rubs or gallops.  Abdomen: Bowel sounds are normal, nontender, nondistended, no hepatosplenomegaly or masses, no abdominal bruits or    hernia , no rebound or guarding.   Rectal: Not performed Extremities: No lower extremity edema. No clubbing or deformities.  Neuro: Alert and oriented x 4 , grossly normal neurologically.  Skin: Warm and dry, no rash or jaundice.   Psych: Alert and cooperative, normal mood and affect.  Labs: Lab Results  Component Value Date   CREATININE 0.86 01/26/2016   BUN 14 01/26/2016   NA 137 01/26/2016   K 4.0 01/26/2016   CL 103 01/26/2016   CO2 28 01/26/2016   Lab Results  Component Value Date   WBC 4.8 01/26/2016   HGB 10.4* 01/26/2016   HCT 32.9* 01/26/2016   MCV 83.7 01/26/2016   PLT  249 01/26/2016   Lab Results  Component Value Date   ALT 12* 11/27/2015   AST 17 11/27/2015   ALKPHOS 54 11/27/2015   BILITOT 0.4 11/27/2015   Lab Results  Component Value Date   IRON 51 11/24/2015   TIBC 370 11/24/2015   FERRITIN 7* 01/24/2016     Imaging Studies: Dg Chest 2 View  01/26/2016  CLINICAL DATA:  Fatigue.  Weakness since this morning. EXAM: CHEST  2 VIEW COMPARISON:  11/27/2015 FINDINGS: There is cardiomegaly. No confluent airspace opacities, effusions or edema. Scarring at the right lung base. No acute bony abnormality. IMPRESSION: Cardiomegaly.  No active disease. Electronically Signed   By: Rolm Baptise M.D.   On: 01/26/2016 18:16   Ct Head Wo Contrast  01/26/2016  CLINICAL DATA:  Altered mental status EXAM: CT HEAD WITHOUT CONTRAST TECHNIQUE: Contiguous axial images were obtained from the base of the skull through the vertex without intravenous contrast. COMPARISON:  June 14, 2015 FINDINGS: Paranasal sinuses, mastoid air cells, bones, and extracranial soft tissues are normal. Cerebellum, brainstem, and basal cisterns are normal. Mild white matter changes are stable. No acute cortical ischemia or infarct. No mass, mass effect, or midline shift. No subdural, epidural, or subarachnoid hemorrhage. IMPRESSION: No acute abnormality. Electronically Signed   By: Dorise Bullion III M.D   On: 01/26/2016 20:26

## 2016-02-14 NOTE — Interval H&P Note (Signed)
History and Physical Interval Note:  02/14/2016 4:07 PM  Carol Massey  has presented today for surgery, with the diagnosis of PUD/DYSPHAGIA  The various methods of treatment have been discussed with the patient and family. After consideration of risks, benefits and other options for treatment, the patient has consented to  Procedure(s) with comments: ESOPHAGOGASTRODUODENOSCOPY (EGD) (N/A) - 300 SAVORY DILATION (N/A) MALONEY DILATION (N/A) as a surgical intervention .  The patient's history has been reviewed, patient examined, no change in status, stable for surgery.  I have reviewed the patient's chart and labs.  Questions were answered to the patient's satisfaction.     Carol Massey  No change. EGD/ED per plan.  The risks, benefits, limitations, alternatives and imponderables have been reviewed with the patient. Potential for esophageal dilation, biopsy, etc. have also been reviewed.  Questions have been answered. All parties agreeable.

## 2016-02-14 NOTE — Discharge Instructions (Signed)
EGD Discharge instructions Please read the instructions outlined below and refer to this sheet in the next few weeks. These discharge instructions provide you with general information on caring for yourself after you leave the hospital. Your doctor may also give you specific instructions. While your treatment has been planned according to the most current medical practices available, unavoidable complications occasionally occur. If you have any problems or questions after discharge, please call your doctor. ACTIVITY  You may resume your regular activity but move at a slower pace for the next 24 hours.   Take frequent rest periods for the next 24 hours.   Walking will help expel (get rid of) the air and reduce the bloated feeling in your abdomen.   No driving for 24 hours (because of the anesthesia (medicine) used during the test).   You may shower.   Do not sign any important legal documents or operate any machinery for 24 hours (because of the anesthesia used during the test).  NUTRITION  Drink plenty of fluids.   You may resume your normal diet.   Begin with a light meal and progress to your normal diet.   Avoid alcoholic beverages for 24 hours or as instructed by your caregiver.  MEDICATIONS  You may resume your normal medications unless your caregiver tells you otherwise.  WHAT YOU CAN EXPECT TODAY  You may experience abdominal discomfort such as a feeling of fullness or gas pains.  FOLLOW-UP  Your doctor will discuss the results of your test with you.  SEEK IMMEDIATE MEDICAL ATTENTION IF ANY OF THE FOLLOWING OCCUR:  Excessive nausea (feeling sick to your stomach) and/or vomiting.   Severe abdominal pain and distention (swelling).   Trouble swallowing.   Temperature over 101 F (37.8 C).   Rectal bleeding or vomiting of blood.     Decrease Protonix to 40 mg once daily  See your primary care doctor about discontinuing Fosamax by mouth and providing you with  medication for osteoporosis which you do not have to take orally. Fosamax may be damaging your esophagus.  Office visit with [place] in 6 weeks  CBC in 4 weeks

## 2016-02-15 ENCOUNTER — Encounter (HOSPITAL_COMMUNITY): Payer: Self-pay | Admitting: Internal Medicine

## 2016-02-15 ENCOUNTER — Encounter: Payer: Self-pay | Admitting: Internal Medicine

## 2016-02-15 ENCOUNTER — Telehealth: Payer: Self-pay | Admitting: Internal Medicine

## 2016-02-15 NOTE — Telephone Encounter (Signed)
Christine in short stay LMOM that pt needed a FU OV in 6 weeks and I have made that for 8/3 at 11am with EG and patient is to have CBC in 4 weeks.

## 2016-02-18 NOTE — Telephone Encounter (Signed)
Nurse's-patient reviewed recently had EGD done by Dr. Sydell Axon. She had a esophageal way of as well as some erosions. It was recommended for her to stop Fosamax. Discontinue Fosamax from her medicine list. Patient does have severe osteoporosis by history therefore she needs IV Boniva or reclast please refer patient for this and inform family of this I spoke with the son the other day I believe he is anticipating this thank you

## 2016-02-19 ENCOUNTER — Other Ambulatory Visit: Payer: Self-pay | Admitting: Internal Medicine

## 2016-02-19 DIAGNOSIS — D500 Iron deficiency anemia secondary to blood loss (chronic): Secondary | ICD-10-CM

## 2016-02-19 NOTE — Telephone Encounter (Signed)
Removed Fosamax from patient's medication list per Dr.Scott Luking. Referral put in for hematology at [facility] for the patient to have IV Boniva or reclast. Notified patient's son and patient's son verbalized understanding.

## 2016-02-19 NOTE — Addendum Note (Signed)
Addended by: Launa Grill on: 02/19/2016 09:08 AM   Modules accepted: Orders, Medications

## 2016-02-19 NOTE — Telephone Encounter (Signed)
Lab orders on file. 

## 2016-03-01 ENCOUNTER — Other Ambulatory Visit: Payer: Self-pay | Admitting: Family Medicine

## 2016-03-04 ENCOUNTER — Other Ambulatory Visit: Payer: Self-pay

## 2016-03-04 ENCOUNTER — Ambulatory Visit: Payer: Medicare Other | Admitting: Gastroenterology

## 2016-03-04 DIAGNOSIS — D500 Iron deficiency anemia secondary to blood loss (chronic): Secondary | ICD-10-CM

## 2016-03-04 NOTE — Telephone Encounter (Signed)
May have this and 4 refills 

## 2016-03-11 LAB — CBC WITH DIFFERENTIAL/PLATELET
BASOS ABS: 58 {cells}/uL (ref 0–200)
Basophils Relative: 1 %
EOS ABS: 232 {cells}/uL (ref 15–500)
Eosinophils Relative: 4 %
HCT: 39.3 % (ref 35.0–45.0)
Hemoglobin: 12.5 g/dL (ref 11.7–15.5)
LYMPHS PCT: 24 %
Lymphs Abs: 1392 cells/uL (ref 850–3900)
MCH: 28.3 pg (ref 27.0–33.0)
MCHC: 31.8 g/dL — ABNORMAL LOW (ref 32.0–36.0)
MCV: 88.9 fL (ref 80.0–100.0)
MONOS PCT: 8 %
MPV: 9.7 fL (ref 7.5–12.5)
Monocytes Absolute: 464 cells/uL (ref 200–950)
NEUTROS PCT: 63 %
Neutro Abs: 3654 cells/uL (ref 1500–7800)
PLATELETS: 221 10*3/uL (ref 140–400)
RBC: 4.42 MIL/uL (ref 3.80–5.10)
RDW: 22.7 % — AB (ref 11.0–15.0)
WBC: 5.8 10*3/uL (ref 3.8–10.8)

## 2016-03-18 NOTE — Progress Notes (Signed)
Please let patient know her Hgb is now normal. She needs to keep OV with Randall Hiss as scheduled.   Mardelle Matte

## 2016-03-22 ENCOUNTER — Encounter: Payer: Self-pay | Admitting: Family Medicine

## 2016-03-22 ENCOUNTER — Ambulatory Visit (INDEPENDENT_AMBULATORY_CARE_PROVIDER_SITE_OTHER): Payer: Medicare Other | Admitting: Family Medicine

## 2016-03-22 VITALS — BP 108/70 | Ht 60.0 in | Wt 99.0 lb

## 2016-03-22 DIAGNOSIS — R270 Ataxia, unspecified: Secondary | ICD-10-CM

## 2016-03-22 DIAGNOSIS — D649 Anemia, unspecified: Secondary | ICD-10-CM | POA: Diagnosis not present

## 2016-03-22 DIAGNOSIS — Z720 Tobacco use: Secondary | ICD-10-CM | POA: Diagnosis not present

## 2016-03-22 DIAGNOSIS — J438 Other emphysema: Secondary | ICD-10-CM | POA: Diagnosis not present

## 2016-03-22 DIAGNOSIS — Z23 Encounter for immunization: Secondary | ICD-10-CM

## 2016-03-22 DIAGNOSIS — M810 Age-related osteoporosis without current pathological fracture: Secondary | ICD-10-CM

## 2016-03-22 LAB — POCT HEMOGLOBIN: Hemoglobin: 12.6 g/dL (ref 12.2–16.2)

## 2016-03-22 NOTE — Progress Notes (Signed)
Referral put in today to [facility]ity center [facility] for reclast.

## 2016-03-22 NOTE — Progress Notes (Signed)
   Subjective:    Patient ID: Carol Massey, female    DOB: September 09, 1925, 82 y.o.   MRN: [id number]  HPIhaving more sob than normal for the past week. 02 today on room air 96%. She has COPD she still smokes she know she needs to quit Weakness on right side since getting out of hospital in march.  She did not have a stroke. Smelly bowel movements. Seeing Dr. Sydell Axon. Sees again next week. Patient denies any blood in her bowel movements. She does take her Protonix on a daily basis She uses Xanax when necessary for anxiety denies being depressed Celexa doing a good job She uses albuterol on a when necessary basis Last pneumococcal vaccine was when she turns 67 given her lung condition and her age I believe she should get a booster of pneumococcal. Hb today 12.6 Has had some ion deficient anemia follows with specialist for this She also has osteoporosis she cannot tolerate current dosing of oral medicine because of esophageal problems she will need IV medicine.  Review of Systems Relates some cough relates shortness breath with activity relates fatigue tiredness    Objective:   Physical Exam Lungs clear hearts regular pulse normal patient is frail she has weakness on the right side of her body she walks with a cane mild ataxia  25 minutes was spent with the patient. Greater than half the time was spent in discussion and answering questions and counseling regarding the issues that the patient came in for today.      Assessment & Plan:  1. Anemia, unspecified anemia type Hemoglobin here looks good continue follow-up with specialist hemoglobin stable - POCT hemoglobin  2. Other emphysema ([place]) Patient encouraged to use inhaler when necessary patient states she tries to manage her symptoms without using inhaler. She knows she knows she should to quit smoking  3. Osteoporosis She will need the injectable medication for osteoporosis-this patient can no longer take oral biphosphonate's because  she is had esophageal reflux with strictures into esophageal stretching.  4. Tobacco abuse She is been counseled to quit smoking  5. Ataxia She knows to use her cane not much else she can do she is at risk of falling her family tries to do the best they can with her abilities.

## 2016-03-25 ENCOUNTER — Ambulatory Visit (INDEPENDENT_AMBULATORY_CARE_PROVIDER_SITE_OTHER): Payer: Medicare Other | Admitting: Nurse Practitioner

## 2016-03-25 ENCOUNTER — Encounter: Payer: Self-pay | Admitting: Nurse Practitioner

## 2016-03-25 VITALS — BP 122/82 | Temp 98.4°F | Ht 60.0 in | Wt 99.5 lb

## 2016-03-25 DIAGNOSIS — T881XXA Other complications following immunization, not elsewhere classified, initial encounter: Secondary | ICD-10-CM

## 2016-03-26 ENCOUNTER — Encounter: Payer: Self-pay | Admitting: Nurse Practitioner

## 2016-03-26 ENCOUNTER — Encounter (HOSPITAL_COMMUNITY): Payer: Self-pay | Admitting: Oncology

## 2016-03-26 ENCOUNTER — Encounter (HOSPITAL_COMMUNITY): Payer: Medicare Other

## 2016-03-26 ENCOUNTER — Encounter (HOSPITAL_COMMUNITY): Payer: Medicare Other | Attending: Oncology | Admitting: Oncology

## 2016-03-26 DIAGNOSIS — Q2733 Arteriovenous malformation of digestive system vessel: Secondary | ICD-10-CM | POA: Diagnosis not present

## 2016-03-26 DIAGNOSIS — D500 Iron deficiency anemia secondary to blood loss (chronic): Secondary | ICD-10-CM

## 2016-03-26 LAB — RENAL FUNCTION PANEL
Albumin: 4.1 g/dL (ref 3.5–5.0)
Anion gap: 7 (ref 5–15)
BUN: 11 mg/dL (ref 6–20)
CHLORIDE: 103 mmol/L (ref 101–111)
CO2: 27 mmol/L (ref 22–32)
CREATININE: 0.83 mg/dL (ref 0.44–1.00)
Calcium: 9.3 mg/dL (ref 8.9–10.3)
Glucose, Bld: 91 mg/dL (ref 65–99)
Phosphorus: 3.1 mg/dL (ref 2.5–4.6)
Potassium: 4.1 mmol/L (ref 3.5–5.1)
Sodium: 137 mmol/L (ref 135–145)

## 2016-03-26 LAB — CBC WITH DIFFERENTIAL/PLATELET
Basophils Absolute: 0.1 10*3/uL (ref 0.0–0.1)
Basophils Relative: 1 %
EOS ABS: 0.2 10*3/uL (ref 0.0–0.7)
EOS PCT: 3 %
HCT: 39.9 % (ref 36.0–46.0)
HEMOGLOBIN: 12.7 g/dL (ref 12.0–15.0)
LYMPHS ABS: 1.1 10*3/uL (ref 0.7–4.0)
Lymphocytes Relative: 24 %
MCH: 29 pg (ref 26.0–34.0)
MCHC: 31.8 g/dL (ref 30.0–36.0)
MCV: 91.1 fL (ref 78.0–100.0)
MONO ABS: 0.4 10*3/uL (ref 0.1–1.0)
MONOS PCT: 9 %
Neutro Abs: 3 10*3/uL (ref 1.7–7.7)
Neutrophils Relative %: 63 %
PLATELETS: 195 10*3/uL (ref 150–400)
RBC: 4.38 MIL/uL (ref 3.87–5.11)
RDW: 21.9 % — ABNORMAL HIGH (ref 11.5–15.5)
WBC: 4.8 10*3/uL (ref 4.0–10.5)

## 2016-03-26 LAB — IRON AND TIBC
IRON: 88 ug/dL (ref 28–170)
Saturation Ratios: 29 % (ref 10.4–31.8)
TIBC: 308 ug/dL (ref 250–450)
UIBC: 220 ug/dL

## 2016-03-26 LAB — FERRITIN: FERRITIN: 108 ng/mL (ref 11–307)

## 2016-03-26 NOTE — Patient Instructions (Signed)
[place] at [facility] Discharge Instructions  RECOMMENDATIONS MADE BY THE CONSULTANT AND ANY TEST RESULTS WILL BE SENT TO YOUR REFERRING PHYSICIAN.  Exam done and seen today by [name] today  Monthly labs Return to see the Doctor in 4 months Call the clinic for any concerns or questions.  Thank you for choosing [place] at [facility] to provide your oncology and hematology care.  To afford each patient quality time with our provider, please arrive at least 15 minutes before your scheduled appointment time.   Beginning January 23rd 2017 lab work for the [facility] will be done in the  Main lab at [facility] on 1st floor. If you have a lab appointment with the [facility] please come in thru the  Main Entrance and check in at the main information desk  You need to re-schedule your appointment should you arrive 10 or more minutes late.  We strive to give you quality time with our providers, and arriving late affects you and other patients whose appointments are after yours.  Also, if you no show three or more times for appointments you may be dismissed from the clinic at the providers discretion.     Again, thank you for choosing [facility].  Our hope is that these requests will decrease the amount of time that you wait before being seen by our physicians.       _____________________________________________________________  Should you have questions after your visit to [facility], please contact our office at [phone] between the hours of 8:30 a.m. and 4:30 p.m.  Voicemails left after 4:30 p.m. will not be returned until the following business day.  For prescription refill requests, have your pharmacy contact our office.         Resources For Cancer Patients and their Caregivers ? American Cancer Society: Can assist with transportation, wigs, general needs, runs Look Good Feel Better.         440-567-0062 ? Cancer Care: Provides financial assistance, online support groups, medication/co-pay assistance.  1-800-813-HOPE 910 691 2552) ? [place] Assists [place] cancer patients and their families through emotional , educational and financial support.  (201)226-2419 ? [place] DSS Where to apply for food stamps, Medicaid and utility assistance. 343-510-4537 ? [facility]: Transportation to medical appointments. (401)728-2546 ? Social Security Administration: May apply for disability if have a Stage IV cancer. (313)436-4944 870-458-7619 ? [employer], Disability and Transit Services: Assists with nutrition, care and transit needs. [place] Support Programs: @10RELATIVEDAYS @ > Cancer Support Group  2nd Tuesday of the month 1pm-2pm, Journey Room  > Creative Journey  3rd Tuesday of the month 1130am-1pm, Journey Room  > Look Good Feel Better  1st Wednesday of the month 10am-12 noon, Journey Room (Call [place] to register [phone])

## 2016-03-26 NOTE — Progress Notes (Signed)
Subjective:  Presents with her son for complaints of possible allergic reaction to her recent pneumonia vaccine. Had the vaccine on 7/28. Developed some redness and warmth at the site of injection. No fever. No other rash. No hives. No trouble breathing. Area has started to improve.  Objective:   BP 122/82   Temp 98.4 F (36.9 C) (Oral)   Ht 5' (1.524 m)   Wt 99 lb 8 oz (45.1 kg)   BMI 19.43 kg/m  NAD. Alert, oriented. A faint ecchymotic area noted on the left upper arm at the site of her pneumonia vaccine injection. There is faint erythema which is fading at the top of the arm minimal warmth and minimally tender. No evidence of cellulitis.  Assessment: Local reaction to immunization, initial encounter  Plan: Explained localized reaction to pneumonia vaccine. Patient has a very loose skin in the upper arm which may have led it to be a little larger than usual. There is no evidence of a systemic reaction. Also advised patient this will be her last pneumonia vaccine. Warning signs reviewed. Call back if further problems.

## 2016-03-26 NOTE — Progress Notes (Signed)
Sallee Lange, MD [place] [address]  Iron deficiency anemia due to chronic blood loss - Plan: CBC with Differential, Iron and TIBC, Ferritin, Renal function panel  CURRENT THERAPY: IV iron replacement as indicated  INTERVAL HISTORY: Carol Massey 82 y.o. 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 On her prior EGD gastric lesions were seen.  She reports right leg and arm weakness which dates back to March 2017.  She notes that it is stable, possibly slightly worse.  This is confirmed by family.    "They don't know where my blood is going."  I have educated the patient, again, about her mechanism of blood loss from AVMs.  She is provided education regarding AVMs.  She is appreciative of the information.  Review of Systems  Constitutional: Negative for chills, fever and weight loss.  HENT: Negative.  Negative for nosebleeds.   Eyes: Negative.   Respiratory: Negative.  Negative for hemoptysis.   Cardiovascular: Negative.   Gastrointestinal: Negative.  Negative for blood in stool and melena.  Genitourinary: Negative.  Negative for hematuria.  Musculoskeletal: Negative.   Skin: Negative.   Neurological: Positive for weakness (right leg and arm, chronic dating back to March 2017.).  Endo/Heme/Allergies: Negative.  Does not bruise/bleed easily.  Psychiatric/Behavioral: Negative.     Past Medical History:  Diagnosis Date  . Anemia   . COPD (chronic obstructive pulmonary disease) ([place])   . Dieulafoy lesion of colon 2006  . Gastric AVM 2014  . Generalized anxiety disorder 07/24/2015  . Hard of hearing   . Hypertension   . Iron deficiency anemia due to chronic blood loss 05/27/2014  . Major depression in remission ([place]) 07/24/2015  . Osteoporosis   . PUD (peptic ulcer disease) 11/27/2015   Gastric and duodenal ulcer on EGD  . Stroke ([place])   . Unsteady  gait   . Upper GI bleed 11/27/2015    Past Surgical History:  Procedure Laterality Date  . ABDOMINAL HYSTERECTOMY    . APPENDECTOMY    . COLONOSCOPY  03/2005   Dr. Gala Romney: rectal and rectosigmoid polyps removed  . COLONOSCOPY WITH ESOPHAGOGASTRODUODENOSCOPY (EGD) N/A 08/12/2013   Dr. Gala Romney: colonoscopy with tubular adenoma, colonic diverticulosis. EGD with non-critical Schatzki's ring, non-manipulated, and gastric AVM with evidence of recent bleeding, s/p thermal sealing and hemostasis clip  . ESOPHAGEAL DILATION  11/27/2015   Procedure: ESOPHAGEAL DILATION;  Surgeon: Daneil Dolin, MD;  [facility];  Service: Endoscopy;;  . ESOPHAGOGASTRODUODENOSCOPY  03/2005   Dr. Gala Romney: tiny distal esophageal erosions  . ESOPHAGOGASTRODUODENOSCOPY N/A 11/27/2015   RMR: Cricopharyngeus web, status post dilation. Nonbleeding duodenal and gastric ulcers without stigmata of bleeding. Gastric biopsies benign  . ESOPHAGOGASTRODUODENOSCOPY N/A 02/14/2016   Procedure: ESOPHAGOGASTRODUODENOSCOPY (EGD);  Surgeon: Daneil Dolin, MD;  Location: [facility];  Service: Endoscopy;  Laterality: N/A;  300  . GIVENS CAPSULE STUDY N/A 11/27/2015   NOT DONE, WAS NOT INDICATED AT THE TIME  . MALONEY DILATION N/A 02/14/2016   Procedure: Venia Minks DILATION;  Surgeon: Daneil Dolin, MD;  Location[facility];  Service: Endoscopy;  Laterality: N/A;  . small bowel capsule study  03/2005   Dr. Laural Golden: two small Dieulafoy lesions at ascending colon. tiney specks of blood at duodenal bulb but no identified lesions  . TONSILLECTOMY      Family History  Problem Relation  Age of Onset  . Hypertension Mother   . Stroke Mother   . Leukemia Father 27  . Colon cancer Neg Hx     Social History   Social History  . Marital status: Married    Spouse name: N/A  . Number of children: 3  . Years of education: N/A   Occupational History  . retired from [employer] History Main Topics  . Smoking status: Former  Smoker    Packs/day: 0.50    Years: 70.00    Types: Cigarettes  . Smokeless tobacco: Never Used     Comment: about 1/2 ppd  . Alcohol use No  . Drug use: No  . Sexual activity: Not Asked   Other Topics Concern  . None   Social History Narrative  . None     PHYSICAL EXAMINATION  ECOG PERFORMANCE STATUS: 2 - Symptomatic, <50% confined to bed  Vitals:   03/26/16 0900  BP: (!) 152/56  Pulse: (!) 56  Resp: 16  Temp: 98.4 F (36.9 C)    GENERAL:alert, comfortable, cooperative, smiling and in wheelchair, accompanied by her husband and son. SKIN: skin color, texture, turgor are normal, no rashes or significant lesions HEAD: Normocephalic, No masses, lesions, tenderness or abnormalities EYES: normal, EOMI, Conjunctiva are pink and non-injected EARS: External ears normal OROPHARYNX:lips, buccal mucosa, and tongue normal and mucous membranes are moist  NECK: supple, trachea midline LYMPH:  no palpable lymphadenopathy BREAST:not examined LUNGS: clear to auscultation  HEART: regular rate & rhythm ABDOMEN:abdomen soft, non-tender and normal bowel sounds BACK: Back symmetric, no curvature. EXTREMITIES:less then 2 second capillary refill, no joint deformities, effusion, or inflammation, no skin discoloration, no cyanosis  NEURO: alert & oriented x 3 with fluent speech, no focal motor/sensory deficits, in a wheelchair   LABORATORY DATA: CBC    Component Value Date/Time   WBC 4.8 03/26/2016 0845   RBC 4.38 03/26/2016 0845   HGB 12.7 03/26/2016 0845   HCT 39.9 03/26/2016 0845   HCT 36.5 02/12/2016 1140   PLT 195 03/26/2016 0845   PLT 303 02/12/2016 1140   MCV 91.1 03/26/2016 0845   MCV 87 02/12/2016 1140   MCH 29.0 03/26/2016 0845   MCHC 31.8 03/26/2016 0845   RDW 21.9 (H) 03/26/2016 0845   RDW 19.2 (H) 02/12/2016 1140   LYMPHSABS 1.1 03/26/2016 0845   LYMPHSABS 1.7 02/12/2016 1140   MONOABS 0.4 03/26/2016 0845   EOSABS 0.2 03/26/2016 0845   EOSABS 0.1 02/12/2016 1140    BASOSABS 0.1 03/26/2016 0845   BASOSABS 0.1 02/12/2016 1140      Chemistry      Component Value Date/Time   NA 137 03/26/2016 0846   NA 138 05/22/2015 0948   K 4.1 03/26/2016 0846   CL 103 03/26/2016 0846   CO2 27 03/26/2016 0846   BUN 11 03/26/2016 0846   BUN 10 05/22/2015 0948   CREATININE 0.83 03/26/2016 0846   CREATININE 0.60 06/17/2013 1241      Component Value Date/Time   CALCIUM 9.3 03/26/2016 0846   ALKPHOS 54 11/27/2015 0734   AST 17 11/27/2015 0734   ALT 12 (L) 11/27/2015 0734   BILITOT 0.4 11/27/2015 0734        PENDING LABS:   RADIOGRAPHIC STUDIES:  No results found.   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  On her prior EGD gastric lesions were seen.  Oncology Flowsheet 02/02/2016  Ferric Carboxymaltose IV [ 750 mg ]   H/O medical noncompliance.  She does not have documented chronic renal disease documented and therefore, we cannot use ferric gluconate anymore due to insurance conflicts.  Labs today: CBC diff, iron/TIBC, ferritin.  I personally reviewed and went over laboratory results with the patient.  The results are noted within this dictation.  Labs every 4 weeks: CBC diff, iron/TIBC, ferritin.  Additional lab in 16 weeks: renal function panel.  The patient's son is interested in monthly labs as well.  She notes right arm and leg weakness that has been an ongoing issue for her since March 2017.  She notes that it is stable and has not progressed.  She has a history of TIAs in the past.  Return in 16 weeks for follow-up.   ORDERS PLACED FOR THIS ENCOUNTER: Orders Placed This Encounter  Procedures  . CBC with Differential  . Iron and TIBC  . Ferritin  . Renal function panel    MEDICATIONS PRESCRIBED THIS ENCOUNTER: No orders of the defined types were placed in this  encounter.   THERAPY PLAN:  Will continue to monitor labs and iron studies and replace iron as indicated.  All questions were answered. The patient knows to call the clinic with any problems, questions or concerns. We can certainly see the patient much sooner if necessary.  Patient and plan discussed with Dr. Ancil Linsey and she is in agreement with the aforementioned.   This note is electronically signed by: Doy Mince 03/26/2016 1:44 PM

## 2016-03-26 NOTE — Assessment & Plan Note (Addendum)
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 On her prior EGD gastric lesions were seen.  Oncology Flowsheet 02/02/2016  Ferric Carboxymaltose IV [ 750 mg ]   H/O medical noncompliance.  She does not have documented chronic renal disease documented and therefore, we cannot use ferric gluconate anymore due to insurance conflicts.  Labs today: CBC diff, iron/TIBC, ferritin.  I personally reviewed and went over laboratory results with the patient.  The results are noted within this dictation.  Labs every 4 weeks: CBC diff, iron/TIBC, ferritin.  Additional lab in 16 weeks: renal function panel.  The patient's son is interested in monthly labs as well.  She notes right arm and leg weakness that has been an ongoing issue for her since March 2017.  She notes that it is stable and has not progressed.  She has a history of TIAs in the past.  Return in 16 weeks for follow-up.

## 2016-03-28 ENCOUNTER — Ambulatory Visit (INDEPENDENT_AMBULATORY_CARE_PROVIDER_SITE_OTHER): Payer: Medicare Other | Admitting: Nurse Practitioner

## 2016-03-28 ENCOUNTER — Encounter: Payer: Self-pay | Admitting: Nurse Practitioner

## 2016-03-28 VITALS — BP 159/67 | HR 59 | Temp 98.0°F | Ht 59.0 in | Wt 98.2 lb

## 2016-03-28 DIAGNOSIS — K279 Peptic ulcer, site unspecified, unspecified as acute or chronic, without hemorrhage or perforation: Secondary | ICD-10-CM

## 2016-03-28 DIAGNOSIS — R1310 Dysphagia, unspecified: Secondary | ICD-10-CM

## 2016-03-28 DIAGNOSIS — K922 Gastrointestinal hemorrhage, unspecified: Secondary | ICD-10-CM | POA: Diagnosis not present

## 2016-03-28 DIAGNOSIS — D500 Iron deficiency anemia secondary to blood loss (chronic): Secondary | ICD-10-CM | POA: Diagnosis not present

## 2016-03-28 NOTE — Assessment & Plan Note (Addendum)
Chronic iron deficiency anemia from blood loss with exacerbations of severe anemia. Her hemoglobin has continued to trend up with every lab draw, currently hemoglobin 12.7, iron, ferritin all normal. Continues to see hematology for lab work on a monthly basis with IV iron and transfusions as needed. Recommend she continue doing so. Has a history of AVMs, healed peptic ulcer disease. Given her known history and likely source of GI bleeding and the fact that her hemoglobin and iron studies are all normal, we will hold off on given study for now. Explained we may need to explore [place] in the future if she has continued issues. Return for follow-up in 3 months or sooner if needed.

## 2016-03-28 NOTE — Patient Instructions (Signed)
1. Continue seen the cancer center for your blood tests, blood cells, and iron as needed. 2. We will try to help with getting a medication IV at the hospital. 3. Return for follow-up in 3 months. 4. Call us if you need as any sooner.

## 2016-03-28 NOTE — Assessment & Plan Note (Signed)
With significant drop in hemoglobin. Upper endoscopy with peptic ulcer disease. Surveillance EGD found healing of ulcers, continues on Protonix once a day. No further bleeding noted. Currently seen hematology/oncology for blood transfusions and IV iron as needed. Continue to monitor, continue seen hematology, return for follow-up in 3 months.

## 2016-03-28 NOTE — Assessment & Plan Note (Signed)
Rare intermittent dysphagia if she takes too big of a bite. Her son tries to keep her foods cut up very small with soft foods. States dysphagia is significantly improved. Continue to monitor, diet precautions as previously explained, return for follow-up in 3 months or sooner if needed.  Of note her by mouth bisphosphonate therapy was discontinued and her primary care is working on changing to parenteral. They have not heard anything in 7 weeks. We will have our office try to contact and expedite initiation of IV bisphosphonates.

## 2016-03-28 NOTE — Progress Notes (Signed)
Referring Provider: [name], MD Primary Care Physician:  Sallee Lange, MD Primary GI:  Dr. Gala Romney  Chief Complaint  Patient presents with  . Follow-up    doing ok    HPI:   Carol Massey is a 82 y.o. female who presents for post-procedure follow-up. She was last seen in our office on 02/12/2016 for iron deficiency anemia due to chronic blood loss. At that time she was due for surveillance EGD for history of gastric ulcer during previous hospitalization this year. She had another drop in hemoglobin between auscultation and her last visit from 10-7 requiring 2 additional units of blood and IV iron. Followed closely by hematology. Has a history of AVMs in the past but last colonoscopy 2014 also notes ongoing dysphagia with a history of web requiring dilation previously. She was arranged for upper endoscopy with plans for possible Givens capsule in the future if EGD unrevealing. Hemoglobin on her last visit was 11.2. It is since continued to increase to 12.5, 12.6, and 12.7 (sees most recent on 03/26/2016).   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 Recommended continue Protonix at a decreased dose of 40 mg once a day. Recommend change bisphosphonate therapy to parenteral route due to esophageal web and difficulties. No need for further upper endoscopy, return to office for follow-up and CBC in 4 weeks.  Today she states she's doing well overall.  Complains of osteoporosis. Her bisphosphonate was discontinued and is pending IV infusion. Her PCP is checking on it and we will call to help check on the status. Energy is "not too good". Occasional dyspnea. She has not noted hematochezia or melena, but she has not noted it in the past either. Her son notes "bowel blood" odor but has not noted it much lately. Denies abdominal pain and chest pain. Denies  chest pain, dizziness, lightheadedness, syncope, near syncope. Has occasional falls related to right-sided weakness with history of TIAs and cessation of bisphosphonates. Weakness started with acute anemia episode but has progressively continued to worsen. Dysphagia is significantly improved. Denies any other upper or lower GI symptoms.  Past Medical History:  Diagnosis Date  . Anemia   . COPD (chronic obstructive pulmonary disease) ([place])   . Dieulafoy lesion of colon 2006  . Gastric AVM 2014  . Generalized anxiety disorder 07/24/2015  . Hard of hearing   . Hypertension   . Iron deficiency anemia due to chronic blood loss 05/27/2014  . Major depression in remission ([place]) 07/24/2015  . Osteoporosis   . PUD (peptic ulcer disease) 11/27/2015   Gastric and duodenal ulcer on EGD  . Stroke ([place])   . Unsteady gait   . Upper GI bleed 11/27/2015    Past Surgical History:  Procedure Laterality Date  . ABDOMINAL HYSTERECTOMY    . APPENDECTOMY    . COLONOSCOPY  03/2005   Dr. Gala Romney: rectal and rectosigmoid polyps removed  . COLONOSCOPY WITH ESOPHAGOGASTRODUODENOSCOPY (EGD) N/A 08/12/2013   Dr. Gala Romney: colonoscopy with tubular adenoma, colonic diverticulosis. EGD with non-critical Schatzki's ring, non-manipulated, and gastric AVM with evidence of recent bleeding, s/p thermal sealing and hemostasis clip  . ESOPHAGEAL DILATION  11/27/2015   Procedure: ESOPHAGEAL DILATION;  Surgeon: Daneil Dolin, MD;  [facility];  Service: Endoscopy;;  . ESOPHAGOGASTRODUODENOSCOPY  03/2005   Dr. Gala Romney: tiny distal esophageal erosions  . ESOPHAGOGASTRODUODENOSCOPY  N/A 11/27/2015   RMR: Cricopharyngeus web, status post dilation. Nonbleeding duodenal and gastric ulcers without stigmata of bleeding. Gastric biopsies benign  . ESOPHAGOGASTRODUODENOSCOPY N/A 02/14/2016   Procedure: ESOPHAGOGASTRODUODENOSCOPY (EGD);  Surgeon: Daneil Dolin, MD;  [facility];  Service: Endoscopy;  Laterality: N/A;  300  .  GIVENS CAPSULE STUDY N/A 11/27/2015   NOT DONE, WAS NOT INDICATED AT THE TIME  . MALONEY DILATION N/A 02/14/2016   Procedure: Venia Minks DILATION;  Surgeon: Daneil Dolin, MD;  [facility];  Service: Endoscopy;  Laterality: N/A;  . small bowel capsule study  03/2005   Dr. Laural Golden: two small Dieulafoy lesions at ascending colon. tiney specks of blood at duodenal bulb but no identified lesions  . TONSILLECTOMY      Current Outpatient Prescriptions  Medication Sig Dispense Refill  . acetaminophen (TYLENOL) 500 MG tablet Take 500 mg by mouth 2 (two) times daily.    [name] ALPRAZolam (XANAX) 0.25 MG tablet TAKE 1 TABLET BY MOUTH TWICE A DAY AS NEEDED 45 tablet 4  . ASCORBIC ACID PO Take 1 capsule by mouth every morning.     . Calcium Carb-Cholecalciferol (CALCIUM + D3 PO) Take 1 capsule by mouth every morning.     . citalopram (CELEXA) 20 MG tablet TAKE 1 TABLET (20 MG TOTAL) BY MOUTH DAILY. (Patient taking differently: TAKE 1 TABLET (20 MG TOTAL) BY MOUTH DAILY IN THE MORNING) 90 tablet 1  . HYDROcodone-acetaminophen (NORCO/VICODIN) 5-325 MG tablet Take 1 tablet by mouth every 4 (four) hours as needed for moderate pain or severe pain. 30 tablet 0  . pantoprazole (PROTONIX) 40 MG tablet Take 1 tablet (40 mg total) by mouth 2 (two) times daily before a meal. For treatment of the ulcer. (Patient taking differently: Take 40 mg by mouth daily. For treatment of the ulcer.) 60 tablet 6  . PROVENTIL HFA 108 (90 BASE) MCG/ACT inhaler INHALE 2 PUFFS INTO THE LUNGS EVERY 6 (SIX) HOURS AS NEEDED FOR WHEEZING. 20.1 Inhaler 2   No current facility-administered medications for this visit.    Facility-Administered Medications Ordered in Other Visits  Medication Dose Route Frequency Provider Last Rate Last Dose  . sodium chloride flush (NS) 0.9 % injection 10 mL  10 mL Intracatheter PRN [facility], PA-C        Allergies as of 03/28/2016  . (No Known Allergies)    Family History  Problem Relation Age  of Onset  . Hypertension Mother   . Stroke Mother   . Leukemia Father 81  . Colon cancer Neg Hx     Social History   Social History  . Marital status: Married    Spouse name: N/A  . Number of children: 3  . Years of education: N/A   Occupational History  . retired from [employer] History Main Topics  . Smoking status: Former Smoker    Packs/day: 0.50    Years: 70.00    Types: Cigarettes  . Smokeless tobacco: Never Used     Comment: about 1/2 ppd  . Alcohol use No  . Drug use: No  . Sexual activity: Not Asked   Other Topics Concern  . None   Social History Narrative  . None    Review of Systems: General: Negative for anorexia, fever, chills. ENT: Negative for hoarseness. Swallowing much improved. CV: Negative for chest pain, angina, palpitations, peripheral edema.  Respiratory: Negative for dyspnea at rest, cough, sputum, wheezing.  GI: See  history of present illness. Endo: Negative for unusual weight change.  Heme: Negative for bruising or bleeding.  Physical Exam: BP (!) 159/67   Pulse (!) 59   Temp 98 F (36.7 C) (Oral)   Ht 4\' 11"  (1.499 m)   Wt 98 lb 3.2 oz (44.5 kg)   BMI 19.83 kg/m  General:   Alert and oriented. Pleasant and cooperative. Well-nourished and well-developed. Thin female. Ears:  Hard of hearing. Cardiovascular:  S1, S2 present without murmurs appreciated. Extremities without clubbing or edema. Respiratory:  Clear to auscultation bilaterally. No wheezes, rales, or rhonchi. No distress.  Gastrointestinal:  +BS, soft, non-tender and non-distended. No guarding or rebound. No masses appreciated.  Rectal:  Deferred  Musculoskalatal:  Symmetrical without gross deformities. Ambulates with a cane. Neurologic:  Alert and oriented x4;  grossly normal neurologically. Psych:  Alert and cooperative. Normal mood and affect. Heme/Lymph/Immune: No excessive bruising noted.    03/28/2016 11:12 AM   Disclaimer: This note was dictated  with voice recognition software. Similar sounding words can inadvertently be transcribed and may not be corrected upon review.

## 2016-03-28 NOTE — Assessment & Plan Note (Signed)
Previous peptic ulcer disease, documented healing on recent EGD. Continue to monitor for further bleeding. 10 you Protonix once a day. Return for follow-up in 3 months.

## 2016-03-28 NOTE — Progress Notes (Signed)
cc'ed to pcp °

## 2016-04-11 ENCOUNTER — Encounter (HOSPITAL_COMMUNITY)
Admission: RE | Admit: 2016-04-11 | Discharge: 2016-04-11 | Disposition: A | Discharge status: Home / Self Care | Payer: Medicare Other | Source: Ambulatory Visit | Attending: Family Medicine | Admitting: Family Medicine

## 2016-04-11 DIAGNOSIS — M810 Age-related osteoporosis without current pathological fracture: Secondary | ICD-10-CM | POA: Insufficient documentation

## 2016-04-11 MED ORDER — SODIUM CHLORIDE 0.9 % IV SOLN: INTRAVENOUS | Status: DC

## 2016-04-11 MED ORDER — ZOLEDRONIC ACID 5 MG/100ML IV SOLN
5.0000 mg | Freq: Once | INTRAVENOUS | Status: AC
  Administered 2016-04-11: 5 mg via INTRAVENOUS
  Filled 2016-04-11: qty 100

## 2016-04-11 NOTE — Discharge Instructions (Signed)

## 2016-04-26 ENCOUNTER — Encounter (HOSPITAL_COMMUNITY): Payer: Medicare Other | Attending: Hematology & Oncology

## 2016-04-26 DIAGNOSIS — D500 Iron deficiency anemia secondary to blood loss (chronic): Secondary | ICD-10-CM | POA: Diagnosis not present

## 2016-04-26 LAB — CBC WITH DIFFERENTIAL/PLATELET
BASOS ABS: 0 10*3/uL (ref 0.0–0.1)
Basophils Relative: 1 %
Eosinophils Absolute: 0.1 10*3/uL (ref 0.0–0.7)
Eosinophils Relative: 2 %
HEMATOCRIT: 38.4 % (ref 36.0–46.0)
Hemoglobin: 12.5 g/dL (ref 12.0–15.0)
LYMPHS PCT: 21 %
Lymphs Abs: 1.4 10*3/uL (ref 0.7–4.0)
MCH: 30.1 pg (ref 26.0–34.0)
MCHC: 32.6 g/dL (ref 30.0–36.0)
MCV: 92.5 fL (ref 78.0–100.0)
MONO ABS: 0.5 10*3/uL (ref 0.1–1.0)
Monocytes Relative: 7 %
NEUTROS ABS: 4.6 10*3/uL (ref 1.7–7.7)
Neutrophils Relative %: 69 %
Platelets: 226 10*3/uL (ref 150–400)
RBC: 4.15 MIL/uL (ref 3.87–5.11)
RDW: 17.9 % — ABNORMAL HIGH (ref 11.5–15.5)
WBC: 6.6 10*3/uL (ref 4.0–10.5)

## 2016-04-26 LAB — FERRITIN: FERRITIN: 39 ng/mL (ref 11–307)

## 2016-04-26 LAB — IRON AND TIBC
Iron: 61 ug/dL (ref 28–170)
SATURATION RATIOS: 19 % (ref 10.4–31.8)
TIBC: 319 ug/dL (ref 250–450)
UIBC: 258 ug/dL

## 2016-04-30 ENCOUNTER — Other Ambulatory Visit (HOSPITAL_COMMUNITY): Payer: Self-pay | Admitting: Oncology

## 2016-05-07 ENCOUNTER — Encounter (HOSPITAL_BASED_OUTPATIENT_CLINIC_OR_DEPARTMENT_OTHER): Payer: Medicare Other

## 2016-05-07 VITALS — BP 158/46 | HR 60 | Temp 98.3°F | Resp 18

## 2016-05-07 DIAGNOSIS — D500 Iron deficiency anemia secondary to blood loss (chronic): Secondary | ICD-10-CM | POA: Diagnosis not present

## 2016-05-07 DIAGNOSIS — Z23 Encounter for immunization: Secondary | ICD-10-CM

## 2016-05-07 DIAGNOSIS — Q2733 Arteriovenous malformation of digestive system vessel: Secondary | ICD-10-CM

## 2016-05-07 MED ORDER — INFLUENZA VAC SPLIT QUAD 0.5 ML IM SUSY
0.5000 mL | PREFILLED_SYRINGE | Freq: Once | INTRAMUSCULAR | Status: AC
  Administered 2016-05-07: 0.5 mL via INTRAMUSCULAR
  Filled 2016-05-07: qty 0.5

## 2016-05-07 MED ORDER — SODIUM CHLORIDE 0.9 % IV SOLN
510.0000 mg | Freq: Once | INTRAVENOUS | Status: AC
  Administered 2016-05-07: 510 mg via INTRAVENOUS
  Filled 2016-05-07: qty 17

## 2016-05-07 MED ORDER — SODIUM CHLORIDE 0.9 % IV SOLN
Freq: Once | INTRAVENOUS | Status: AC
  Administered 2016-05-07: 15:00:00 via INTRAVENOUS

## 2016-05-07 NOTE — Patient Instructions (Signed)
[place] at [facility] Discharge Instructions  RECOMMENDATIONS MADE BY THE CONSULTANT AND ANY TEST RESULTS WILL BE SENT TO YOUR REFERRING PHYSICIAN.  IV iron infusion today.   You also received your flu vaccination.    Thank you for choosing [place] at [facility] to provide your oncology and hematology care.  To afford each patient quality time with our provider, please arrive at least 15 minutes before your scheduled appointment time.   Beginning January 23rd 2017 lab work for the [facility] will be done in the  Main lab at [facility] on 1st floor. If you have a lab appointment with the [facility] please come in thru the  Main Entrance and check in at the main information desk  You need to re-schedule your appointment should you arrive 10 or more minutes late.  We strive to give you quality time with our providers, and arriving late affects you and other patients whose appointments are after yours.  Also, if you no show three or more times for appointments you may be dismissed from the clinic at the providers discretion.     Again, thank you for choosing [facility].  Our hope is that these requests will decrease the amount of time that you wait before being seen by our physicians.       _____________________________________________________________  Should you have questions after your visit to [facility], please contact our office at [phone] between the hours of 8:30 a.m. and 4:30 p.m.  Voicemails left after 4:30 p.m. will not be returned until the following business day.  For prescription refill requests, have your pharmacy contact our office.         Resources For Cancer Patients and their Caregivers ? American Cancer Society: Can assist with transportation, wigs, general needs, runs Look Good Feel Better.        339-198-2539 ? Cancer Care: Provides financial assistance, online support  groups, medication/co-pay assistance.  1-800-813-HOPE 828-063-1044) ? [place] Assists [place] cancer patients and their families through emotional , educational and financial support.  510-510-0858 ? [place] DSS Where to apply for food stamps, Medicaid and utility assistance. 561-427-7819 ? RCATS: Transportation to medical appointments. 424-686-6643 ? Social Security Administration: May apply for disability if have a Stage IV cancer. 256-742-6290 332-453-3665 ? [employer], Disability and Transit Services: Assists with nutrition, care and transit needs. [place] Support Programs: @10RELATIVEDAYS @ > Cancer Support Group  2nd Tuesday of the month 1pm-2pm, Journey Room  > Creative Journey  3rd Tuesday of the month 1130am-1pm, Journey Room  > Look Good Feel Better  1st Wednesday of the month 10am-12 noon, Journey Room (Call [place] to register [phone])

## 2016-05-07 NOTE — Progress Notes (Signed)
Carol Massey presents today for injection per MD orders. Flu vaccination administered IM in right Upper Arm. Administration without incident. Patient tolerated infusion and injection well.  VSS  Patient stable and wheeled out via wheelchair by the family at discharge from clinic.

## 2016-05-26 ENCOUNTER — Other Ambulatory Visit: Payer: Self-pay | Admitting: Family Medicine

## 2016-05-27 ENCOUNTER — Encounter (HOSPITAL_COMMUNITY): Payer: Medicare Other | Attending: Hematology & Oncology

## 2016-05-27 DIAGNOSIS — D500 Iron deficiency anemia secondary to blood loss (chronic): Secondary | ICD-10-CM | POA: Insufficient documentation

## 2016-05-27 LAB — CBC WITH DIFFERENTIAL/PLATELET
Basophils Absolute: 0.1 10*3/uL (ref 0.0–0.1)
Basophils Relative: 1 %
Eosinophils Absolute: 0.2 10*3/uL (ref 0.0–0.7)
Eosinophils Relative: 3 %
HCT: 43.4 % (ref 36.0–46.0)
Hemoglobin: 14.3 g/dL (ref 12.0–15.0)
Lymphocytes Relative: 22 %
Lymphs Abs: 1.3 10*3/uL (ref 0.7–4.0)
MCH: 32.3 pg (ref 26.0–34.0)
MCHC: 32.9 g/dL (ref 30.0–36.0)
MCV: 98 fL (ref 78.0–100.0)
Monocytes Absolute: 0.4 10*3/uL (ref 0.1–1.0)
Monocytes Relative: 7 %
Neutro Abs: 4 10*3/uL (ref 1.7–7.7)
Neutrophils Relative %: 67 %
Platelets: 226 10*3/uL (ref 150–400)
RBC: 4.43 MIL/uL (ref 3.87–5.11)
RDW: 13.6 % (ref 11.5–15.5)
WBC: 6 10*3/uL (ref 4.0–10.5)

## 2016-05-27 LAB — IRON AND TIBC
Iron: 97 ug/dL (ref 28–170)
Saturation Ratios: 32 % — ABNORMAL HIGH (ref 10.4–31.8)
TIBC: 305 ug/dL (ref 250–450)
UIBC: 208 ug/dL

## 2016-05-27 LAB — FERRITIN: FERRITIN: 357 ng/mL — AB (ref 11–307)

## 2016-05-28 ENCOUNTER — Telehealth (HOSPITAL_COMMUNITY): Payer: Self-pay | Admitting: *Deleted

## 2016-05-28 NOTE — Telephone Encounter (Signed)
-----   Message from Baird Cancer, PA-C sent at 05/27/2016  4:59 PM EDT ----- Kermit Balo!

## 2016-05-28 NOTE — Telephone Encounter (Signed)
Pt aware of results 

## 2016-06-21 ENCOUNTER — Ambulatory Visit (INDEPENDENT_AMBULATORY_CARE_PROVIDER_SITE_OTHER): Payer: Medicare Other | Admitting: Family Medicine

## 2016-06-21 ENCOUNTER — Encounter: Payer: Self-pay | Admitting: Family Medicine

## 2016-06-21 VITALS — BP 110/72 | Ht 60.0 in | Wt 101.4 lb

## 2016-06-21 DIAGNOSIS — R270 Ataxia, unspecified: Secondary | ICD-10-CM

## 2016-06-21 DIAGNOSIS — D500 Iron deficiency anemia secondary to blood loss (chronic): Secondary | ICD-10-CM | POA: Diagnosis not present

## 2016-06-21 LAB — POCT HEMOGLOBIN: HEMOGLOBIN: 12.5 g/dL (ref 12.2–16.2)

## 2016-06-21 NOTE — Progress Notes (Signed)
   Subjective:    Patient ID: Carol Massey, female    DOB: [birth year], 82 y.o.   MRN: [id number]  HPI Patient in today for a medication check for hemoglobin.  She is concerned about the possibility of anemia. She has seen the hematologist on a regular basis and her blood work has been looking good recently she's been getting weak and falling. She's had several different falls. Patient has also had some falls. Has some c/o of pain to head.  Patient has been having some weakness more on the right leg in the right arm. Has more difficult time getting around. She uses a cane. Would also like to discuss having someone come to the home to help patient with some ADL's   Review of Systems She denies any chest tightness pressure pain she does get a little short of breath when she tries push herself. She denies any other particular troubles currently no vomiting or diarrhea fever chills no cough    Objective:   Physical Exam Lungs are clear no crackles heart regular pulse normal blood pressure good patient does have moderate ataxia when she walks she uses a cane but sometimes has to put both hands on the cane to steady herself       Assessment & Plan:  I don't find any evidence of stroke Patient's blood pressure under good control Patient would benefit from physical therapy  Face-to-face evaluation was done with this patient. She is homebound. She suffering with weakness and ataxia. She would benefit from physical therapy to help her with her walking. She would also benefit from a walker in addition to this also wouldn't benefit from having a to help her with showering once or twice a week

## 2016-06-27 ENCOUNTER — Encounter (HOSPITAL_COMMUNITY): Payer: Medicare Other | Attending: Hematology & Oncology

## 2016-06-27 DIAGNOSIS — D500 Iron deficiency anemia secondary to blood loss (chronic): Secondary | ICD-10-CM | POA: Insufficient documentation

## 2016-06-27 LAB — IRON AND TIBC
Iron: 92 ug/dL (ref 28–170)
SATURATION RATIOS: 31 % (ref 10.4–31.8)
TIBC: 295 ug/dL (ref 250–450)
UIBC: 203 ug/dL

## 2016-06-27 LAB — CBC WITH DIFFERENTIAL/PLATELET
BASOS ABS: 0.1 10*3/uL (ref 0.0–0.1)
BASOS PCT: 1 %
Eosinophils Absolute: 0.1 10*3/uL (ref 0.0–0.7)
Eosinophils Relative: 1 %
HEMATOCRIT: 41.1 % (ref 36.0–46.0)
HEMOGLOBIN: 13.4 g/dL (ref 12.0–15.0)
LYMPHS PCT: 23 %
Lymphs Abs: 1.5 10*3/uL (ref 0.7–4.0)
MCH: 32.2 pg (ref 26.0–34.0)
MCHC: 32.6 g/dL (ref 30.0–36.0)
MCV: 98.8 fL (ref 78.0–100.0)
MONOS PCT: 8 %
Monocytes Absolute: 0.5 10*3/uL (ref 0.1–1.0)
NEUTROS ABS: 4.2 10*3/uL (ref 1.7–7.7)
NEUTROS PCT: 67 %
Platelets: 224 10*3/uL (ref 150–400)
RBC: 4.16 MIL/uL (ref 3.87–5.11)
RDW: 13 % (ref 11.5–15.5)
WBC: 6.3 10*3/uL (ref 4.0–10.5)

## 2016-06-27 LAB — FERRITIN: FERRITIN: 156 ng/mL (ref 11–307)

## 2016-06-28 ENCOUNTER — Telehealth: Payer: Self-pay | Admitting: Family Medicine

## 2016-06-28 NOTE — Telephone Encounter (Signed)
Per Dr.Scott Luking-May have verbal order for PT. Left message for [name] to return the call.

## 2016-06-28 NOTE — Telephone Encounter (Signed)
Physical therapy at [facility] is needing verbal orders for a treatment plan of 2 times a week for 4 weeks, and then 1 time a week for one week to address balance and gait deficits.

## 2016-07-01 ENCOUNTER — Encounter: Payer: Self-pay | Admitting: Nurse Practitioner

## 2016-07-01 ENCOUNTER — Ambulatory Visit (INDEPENDENT_AMBULATORY_CARE_PROVIDER_SITE_OTHER): Payer: Medicare Other | Admitting: Nurse Practitioner

## 2016-07-01 VITALS — BP 136/67 | HR 60 | Temp 98.2°F | Ht 59.0 in | Wt 100.0 lb

## 2016-07-01 DIAGNOSIS — R1310 Dysphagia, unspecified: Secondary | ICD-10-CM | POA: Diagnosis not present

## 2016-07-01 DIAGNOSIS — K279 Peptic ulcer, site unspecified, unspecified as acute or chronic, without hemorrhage or perforation: Secondary | ICD-10-CM | POA: Diagnosis not present

## 2016-07-01 DIAGNOSIS — D500 Iron deficiency anemia secondary to blood loss (chronic): Secondary | ICD-10-CM

## 2016-07-01 DIAGNOSIS — K922 Gastrointestinal hemorrhage, unspecified: Secondary | ICD-10-CM

## 2016-07-01 NOTE — Assessment & Plan Note (Signed)
Patient with iron deficiency anemia likely due to chronic losses. Follows with hematology blood work monthly and iron/transfusion as needed. Her last CBC showed an excellent hemoglobin in the 12 some. Iron noted to be normal. Recommend continue seen hematology for ongoing monitoring and treatment. Return for follow-up in 6 months or as needed for any recurrent or new symptoms.

## 2016-07-01 NOTE — Telephone Encounter (Signed)
[name] notified

## 2016-07-01 NOTE — Assessment & Plan Note (Signed)
Resolved as of last endoscopy. Continue to monitor. Return for follow-up in 6 months.

## 2016-07-01 NOTE — Patient Instructions (Signed)
1. Continue your current medications. 2. Continue to see hematology/oncology to monitor your anemia and give you iron and/or transfusions as needed. 3. Call us if you have any recurrent or new symptoms. 4. We will see back in 6 months regardless. 5. Have a great holiday season!

## 2016-07-01 NOTE — Progress Notes (Signed)
CC'ED TO PCP 

## 2016-07-01 NOTE — Progress Notes (Signed)
Referring Provider: [name], MD Primary Care Physician:  Sallee Lange, MD Primary GI:  Dr. Gala Romney  Chief Complaint  Patient presents with  . Follow-up    IDA better, had labs done last week    HPI:   Carol Massey is a 82 y.o. female who presents for follow-up on iron deficiency anemia. Patient was last seen in our office on 03/28/2016 for upper GI bleed, dysphagia, peptic ulcer disease, anemia. That was a post procedure follow-up visit. She did recently undergo surveillance endoscopy for gastric ulcer with a history of AVMs and esophageal web requiring dilation. She is followed closely by hematology for anemia. Her hemoglobin was noted to be stable and increasing, most recently 12.7 on 03/26/2016. EGD found known web status post dilation, healed peptic ulcer disease. Recommended continue Protonix 40 mg once a day and change bisphosphonate therapy to help prevent recurrence of esophageal web. Recommend she continue to see hematology for lab work and IV iron/transfusion as needed. Dysphagia found to be significantly improved after dilation and her bisphosphonate therapy was being transitioned to parenteral from oral. Continue Protonix.  Last CBC on 06/27/2016 found a hemoglobin of 13.4, iron/TIBC/iron sat normal, ferritin normal at 156.  Today she states she's doing well overall. Last labs are best they've been recently. Still with some low energy. Has had recurrent falls and dyspnea recently. They have discussed this with their PCP. Swallowing doing really well. Rare dysphagia, but "99.9% of the time it does really well." Denies GERD symptoms. Denies abdominal pain, N/V, hematochezia, melena. Rarely she'll have intermittent loose stools with odd/foul odor, which is much imrpoved from hospitalization. Does have some dizziness when she had rapid head movement "likely related to her history of TIAs." She has a PT starting again this week related to recent falls. Denies chest pain, syncope,  near syncope. Denies any other upper or lower GI symptoms.  Past Medical History:  Diagnosis Date  . Anemia   . COPD (chronic obstructive pulmonary disease) ([place])   . Dieulafoy lesion of colon 2006  . Gastric AVM 2014  . Generalized anxiety disorder 07/24/2015  . Hard of hearing   . Hypertension   . Iron deficiency anemia due to chronic blood loss 05/27/2014  . Major depression in remission ([place]) 07/24/2015  . Osteoporosis   . PUD (peptic ulcer disease) 11/27/2015   Gastric and duodenal ulcer on EGD  . Stroke ([place])   . Unsteady gait   . Upper GI bleed 11/27/2015    Past Surgical History:  Procedure Laterality Date  . ABDOMINAL HYSTERECTOMY    . APPENDECTOMY    . COLONOSCOPY  03/2005   Dr. Gala Romney: rectal and rectosigmoid polyps removed  . COLONOSCOPY WITH ESOPHAGOGASTRODUODENOSCOPY (EGD) N/A 08/12/2013   Dr. Gala Romney: colonoscopy with tubular adenoma, colonic diverticulosis. EGD with non-critical Schatzki's ring, non-manipulated, and gastric AVM with evidence of recent bleeding, s/p thermal sealing and hemostasis clip  . ESOPHAGEAL DILATION  11/27/2015   Procedure: ESOPHAGEAL DILATION;  Surgeon: Daneil Dolin, MD;  Location: [facility];  Service: Endoscopy;;  . ESOPHAGOGASTRODUODENOSCOPY  03/2005   Dr. Gala Romney: tiny distal esophageal erosions  . ESOPHAGOGASTRODUODENOSCOPY N/A 11/27/2015   RMR: Cricopharyngeus web, status post dilation. Nonbleeding duodenal and gastric ulcers without stigmata of bleeding. Gastric biopsies benign  . ESOPHAGOGASTRODUODENOSCOPY N/A 02/14/2016   Procedure: ESOPHAGOGASTRODUODENOSCOPY (EGD);  Surgeon: Daneil Dolin, MD;  Location: [facility] END[facility] SUITE;  Service: Endoscopy;  Laterality: N/A;  300  . GIVENS CAPSULE STUDY N/A  11/27/2015   NOT DONE, WAS NOT INDICATED AT THE TIME  . MALONEY DILATION N/A 02/14/2016   Procedure: Venia Minks DILATION;  Surgeon: Daneil Dolin, MD;  Location: [facility];  Service: Endoscopy;  Laterality: N/A;  . small bowel capsule study  03/2005    Dr. Laural Golden: two small Dieulafoy lesions at ascending colon. tiney specks of blood at duodenal bulb but no identified lesions  . TONSILLECTOMY      Current Outpatient Prescriptions  Medication Sig Dispense Refill  . acetaminophen (TYLENOL) 325 MG tablet Take 325 mg by mouth every 6 (six) hours as needed.    . ALPRAZolam (XANAX) 0.25 MG tablet TAKE 1 TABLET BY MOUTH TWICE A DAY AS NEEDED 45 tablet 4  . ASCORBIC ACID PO Take 1 capsule by mouth every morning.     . Calcium Carb-Cholecalciferol (CALCIUM + D3 PO) Take 1 capsule by mouth every morning.     . citalopram (CELEXA) 20 MG tablet TAKE 1 TABLET (20 MG TOTAL) BY MOUTH DAILY. 90 tablet 1  . pantoprazole (PROTONIX) 40 MG tablet Take 1 tablet (40 mg total) by mouth 2 (two) times daily before a meal. For treatment of the ulcer. (Patient taking differently: Take 40 mg by mouth daily. For treatment of the ulcer.) 60 tablet 6  . PROVENTIL HFA 108 (90 BASE) MCG/ACT inhaler INHALE 2 PUFFS INTO THE LUNGS EVERY 6 (SIX) HOURS AS NEEDED FOR WHEEZING. 20.1 Inhaler 2   No current facility-administered medications for this visit.    Facility-Administered Medications Ordered in Other Visits  Medication Dose Route Frequency Provider Last Rate Last Dose  . sodium chloride flush (NS) 0.9 % injection 10 mL  10 mL Intracatheter PRN Baird Cancer, PA-C        Allergies as of 07/01/2016  . (No Known Allergies)    Family History  Problem Relation Age of Onset  . Hypertension Mother   . Stroke Mother   . Leukemia Father 61  . Colon cancer Neg Hx     Social History   Social History  . Marital status: Married    Spouse name: N/A  . Number of children: 3  . Years of education: N/A   Occupational History  . retired from [employer] History Main Topics  . Smoking status: Former Smoker    Packs/day: 0.50    Years: 70.00    Types: Cigarettes  . Smokeless tobacco: Never Used     Comment: about 1/2 ppd  . Alcohol use No  . Drug use:  No  . Sexual activity: Not Asked   Other Topics Concern  . None   Social History Narrative  . None    Review of Systems: General: Negative for anorexia, weight loss, fever, chills. ENT: Negative for hoarseness, difficulty swallowing. CV: Negative for chest pain, angina, palpitations, peripheral edema.  Respiratory: Negative for dyspnea at rest, cough, sputum, wheezing.  GI: See history of present illness. MS: Chronic right-sided weakness from history of TIAs.  Derm: Negative for rash or itching.  Endo: Negative for unusual weight change.  Heme: Negative for bruising or bleeding.   Physical Exam: BP 136/67   Pulse 60   Temp 98.2 F (36.8 C) (Oral)   Ht 4\' 11"  (1.499 m)   Wt 100 lb (45.4 kg)   BMI 20.20 kg/m  General:   Alert and oriented. Pleasant and cooperative. Well-nourished and well-developed.  Eyes:  Without icterus, sclera clear and conjunctiva pink.  Ears:  Hard of hearing. Cardiovascular:  S1, S2 present with blowing 3/6 systolic murmur appreciated. Extremities without clubbing or edema. Respiratory:  Clear to auscultation bilaterally. No wheezes, rales, or rhonchi. No distress.  Gastrointestinal:  +BS, soft, non-tender and non-distended. No HSM noted. No guarding or rebound. No masses appreciated.  Rectal:  Deferred  Musculoskalatal:  Ambulates with a cane. Neurologic:  Alert and oriented x4;  grossly normal neurologically. Psych:  Alert and cooperative. Normal mood and affect. Heme/Lymph/Immune: No excessive bruising noted.    07/01/2016 11:28 AM   Disclaimer: This note was dictated with voice recognition software. Similar sounding words can inadvertently be transcribed and may not be corrected upon review.

## 2016-07-01 NOTE — Assessment & Plan Note (Signed)
No obvious GI bleed noted by patient or family. States her stools less have a "foul odor" and thinks this is related to decrease in GI bleed. Continue monitoring for any obvious recurrent GI bleeding, call us if any noted.

## 2016-07-01 NOTE — Assessment & Plan Note (Signed)
Significantly improved after endoscopy with dilation. Continue to monitor, return for follow-up in 6 months or as needed for worsening or recurrent symptoms.

## 2016-07-04 DIAGNOSIS — G8191 Hemiplegia, unspecified affecting right dominant side: Secondary | ICD-10-CM | POA: Diagnosis not present

## 2016-07-04 DIAGNOSIS — Z8673 Personal history of transient ischemic attack (TIA), and cerebral infarction without residual deficits: Secondary | ICD-10-CM | POA: Diagnosis not present

## 2016-07-04 DIAGNOSIS — M810 Age-related osteoporosis without current pathological fracture: Secondary | ICD-10-CM | POA: Diagnosis not present

## 2016-07-04 DIAGNOSIS — D649 Anemia, unspecified: Secondary | ICD-10-CM | POA: Diagnosis not present

## 2016-07-04 DIAGNOSIS — J449 Chronic obstructive pulmonary disease, unspecified: Secondary | ICD-10-CM | POA: Diagnosis not present

## 2016-07-04 DIAGNOSIS — R260 Ataxic gait: Secondary | ICD-10-CM | POA: Diagnosis not present

## 2016-07-04 DIAGNOSIS — R296 Repeated falls: Secondary | ICD-10-CM | POA: Diagnosis not present

## 2016-07-05 ENCOUNTER — Telehealth: Payer: Self-pay | Admitting: Family Medicine

## 2016-07-05 NOTE — Telephone Encounter (Signed)
[facility] ([name]) requesting order for patient a 4 wheel walker with seat to be faxed to them [phone]. Attn: [name]

## 2016-07-07 NOTE — Telephone Encounter (Signed)
Please write this out and please forward appropriately-I will sign it

## 2016-07-09 NOTE — Telephone Encounter (Signed)
Script written and faxed per Dr.Scott Wolfgang [place]

## 2016-07-10 ENCOUNTER — Telehealth: Payer: Self-pay | Admitting: Family Medicine

## 2016-07-10 NOTE — Telephone Encounter (Signed)
Tramadol and Celexa can interact together. I would recommend a lower dose of Celexa to lessen the risk of interaction. It is important that the patient and family understand that if tramadol causes nausea excessive sweating palpitations to stop the medicine. I would recommend reducing Celexa-new dose 10 mg daily, tramadol may be used 1 twice daily when necessary, #30 with 2 refills

## 2016-07-10 NOTE — Telephone Encounter (Signed)
[facility] nurse called stating that pt states that she is needing something stronger than tylenol for pain. Pt has had hydrocodone in the past but states that it is too strong for her. Home health nurse is wanting to know if the pt can get tramadol or something similar for pain. Please advise.

## 2016-07-11 ENCOUNTER — Telehealth: Payer: Self-pay | Admitting: Family Medicine

## 2016-07-11 MED ORDER — CITALOPRAM HYDROBROMIDE 10 MG PO TABS: 10.0000 mg | ORAL_TABLET | Freq: Every day | ORAL | 0 refills | Status: DC

## 2016-07-11 MED ORDER — TRAMADOL HCL 50 MG PO TABS: ORAL_TABLET | ORAL | 2 refills | Status: DC

## 2016-07-11 NOTE — Telephone Encounter (Signed)
Spoke with patient's son and informed him that medication was called in. Patient's son verbalized understanding.

## 2016-07-11 NOTE — Telephone Encounter (Signed)
FYI patient's son wanted to notify you that the patient had a fall on yesterday. States she has no injuries and he does not feel she needs to be seen but he wanted to make Dr.Scott aware.

## 2016-07-11 NOTE — Telephone Encounter (Signed)
[facility] are telling patients son that they do not have the Rx for Tramadol that was sent over yesterday.  Carol Massey wants to know if we can resend.

## 2016-07-11 NOTE — Telephone Encounter (Signed)
Spoke with patient's son and informed him per Dr.Scott Luking- Tramadol and Celexa can interact together. Dr. Nicki Reaper would recommend a lower dose of Celexa to lessen the risk of interaction. It is important that the family understand that if tramadol causes nausea excessive sweating palpitations to stop the medicine. Dr.Scott would recommend reducing the Celexa to the new dose of 10 mg daily and tramadol 1 twice daily when necessary. Patient's son verbalized understanding.

## 2016-07-11 NOTE — Telephone Encounter (Signed)
noted 

## 2016-07-12 ENCOUNTER — Telehealth: Payer: Self-pay | Admitting: Family Medicine

## 2016-07-12 NOTE — Telephone Encounter (Signed)
Spoke with [name] at [facility] and informed her per Dr.Scott Luking- May have home health aid for one week. [name] verbalized understanding.

## 2016-07-12 NOTE — Telephone Encounter (Signed)
Requesting extension for home health aid for one week.

## 2016-07-12 NOTE — Telephone Encounter (Signed)
May give verbal order for this.

## 2016-07-24 ENCOUNTER — Encounter (HOSPITAL_COMMUNITY): Payer: Medicare Other

## 2016-07-24 ENCOUNTER — Other Ambulatory Visit (HOSPITAL_COMMUNITY): Payer: Self-pay

## 2016-07-24 DIAGNOSIS — D500 Iron deficiency anemia secondary to blood loss (chronic): Secondary | ICD-10-CM

## 2016-07-24 LAB — CBC WITH DIFFERENTIAL/PLATELET
BASOS ABS: 0.1 10*3/uL (ref 0.0–0.1)
BASOS PCT: 1 %
EOS ABS: 0.1 10*3/uL (ref 0.0–0.7)
Eosinophils Relative: 2 %
HCT: 40.2 % (ref 36.0–46.0)
Hemoglobin: 13.3 g/dL (ref 12.0–15.0)
Lymphocytes Relative: 26 %
Lymphs Abs: 1.4 10*3/uL (ref 0.7–4.0)
MCH: 32 pg (ref 26.0–34.0)
MCHC: 33.1 g/dL (ref 30.0–36.0)
MCV: 96.9 fL (ref 78.0–100.0)
MONO ABS: 0.6 10*3/uL (ref 0.1–1.0)
MONOS PCT: 11 %
NEUTROS PCT: 60 %
Neutro Abs: 3.4 10*3/uL (ref 1.7–7.7)
PLATELETS: 210 10*3/uL (ref 150–400)
RBC: 4.15 MIL/uL (ref 3.87–5.11)
RDW: 12.8 % (ref 11.5–15.5)
WBC: 5.6 10*3/uL (ref 4.0–10.5)

## 2016-07-24 LAB — SAMPLE TO BLOOD BANK

## 2016-07-24 LAB — FERRITIN: FERRITIN: 72 ng/mL (ref 11–307)

## 2016-07-25 ENCOUNTER — Other Ambulatory Visit (HOSPITAL_COMMUNITY): Payer: Self-pay | Admitting: Oncology

## 2016-07-26 ENCOUNTER — Other Ambulatory Visit (HOSPITAL_COMMUNITY): Payer: Self-pay

## 2016-07-26 ENCOUNTER — Telehealth (HOSPITAL_COMMUNITY): Payer: Self-pay | Admitting: *Deleted

## 2016-07-29 ENCOUNTER — Other Ambulatory Visit (HOSPITAL_COMMUNITY): Payer: Medicare Other

## 2016-07-29 ENCOUNTER — Encounter (HOSPITAL_COMMUNITY): Payer: Self-pay | Admitting: Oncology

## 2016-07-29 ENCOUNTER — Encounter (HOSPITAL_COMMUNITY): Payer: Medicare Other | Attending: Hematology & Oncology | Admitting: Oncology

## 2016-07-29 VITALS — BP 134/62 | HR 62 | Temp 98.1°F | Resp 18 | Wt 99.8 lb

## 2016-07-29 DIAGNOSIS — D500 Iron deficiency anemia secondary to blood loss (chronic): Secondary | ICD-10-CM

## 2016-07-29 DIAGNOSIS — Q2733 Arteriovenous malformation of digestive system vessel: Secondary | ICD-10-CM | POA: Diagnosis not present

## 2016-07-29 NOTE — Addendum Note (Signed)
Addended by: Josephina Shih A on: 07/29/2016 11:13 AM   Modules accepted: Orders

## 2016-07-29 NOTE — Patient Instructions (Signed)
[place] at [facility] Discharge Instructions  RECOMMENDATIONS MADE BY THE CONSULTANT AND ANY TEST RESULTS WILL BE SENT TO YOUR REFERRING PHYSICIAN.  Exam with Robynn Pane, PA. IV iron as scheduled.   Return in 4 months for follow up. Please see [name] as you leave for appointments.    Thank you for choosing [place] at [facility] to provide your oncology and hematology care.  To afford each patient quality time with our provider, please arrive at least 15 minutes before your scheduled appointment time.   Beginning January 23rd 2017 lab work for the [facility] will be done in the  Main lab at [facility] on 1st floor. If you have a lab appointment with the [place] please come in thru the  Main Entrance and check in at the main information desk  You need to re-schedule your appointment should you arrive 10 or more minutes late.  We strive to give you quality time with our providers, and arriving late affects you and other patients whose appointments are after yours.  Also, if you no show three or more times for appointments you may be dismissed from the clinic at the providers discretion.     Again, thank you for choosing [facility].  Our hope is that these requests will decrease the amount of time that you wait before being seen by our physicians.       _____________________________________________________________  Should you have questions after your visit to [facility], please contact our office at [phone] between the hours of 8:30 a.m. and 4:30 p.m.  Voicemails left after 4:30 p.m. will not be returned until the following business day.  For prescription refill requests, have your pharmacy contact our office.         Resources For Cancer Patients and their Caregivers ? American Cancer Society: Can assist with transportation, wigs, general needs, runs Look Good Feel Better.         563-606-2026 ? Cancer Care: Provides financial assistance, online support groups, medication/co-pay assistance.  1-800-813-HOPE 8561650714) ? [place] Assists [place] cancer patients and their families through emotional , educational and financial support.  ([phone] ? [place] DSS Where to apply for food stamps, Medicaid and utility assistance. 2703318250 ? RCATS: Transportation to medical appointments. 952-825-9530 ? Social Security Administration: May apply for disability if have a Stage IV cancer. 651-650-4401 4345465023 ? [employer], Disability and Transit Services: Assists with nutrition, care and transit needs. [place] Support Programs: @10RELATIVEDAYS @ > Cancer Support Group  2nd Tuesday of the month 1pm-2pm, Journey Room  > Creative Journey  3rd Tuesday of the month 1130am-1pm, Journey Room  > Look Good Feel Better  1st Wednesday of the month 10am-12 noon, Journey Room (Call [place] to register [phone])

## 2016-07-29 NOTE — Assessment & Plan Note (Addendum)
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 On her prior EGD gastric lesions were seen.  H/O medical noncompliance.  She does not have documented chronic renal disease documented and therefore, we cannot use ferric gluconate anymore due to insurance conflicts.  Labs from 07/24/2016 reviewed: CBC diff, iron/TIBC, ferritin.  I personally reviewed and went over laboratory results with the patient.  The results are noted within this dictation.  She is minimally iron deficient based upon her calculation, BUT her HGB is NORMAL.  She is scheduled for IV feraheme on 08/01/2016.  Labs every 4 weeks: CBC diff, iron/TIBC, ferritin.  Additional lab in 16 weeks: renal function panel.  The patient's son is interested in monthly labs as well.  She will follow-up with Dr. Wolfgang [place] regarding her right sided weakness that is chronic.  She is advised that her lab work does not explain this unilateral weakness that has been ongoing since March/April 2017.  She does have a history of TIAs.  Return in 16 weeks for follow-up.

## 2016-07-29 NOTE — Progress Notes (Signed)
Sallee Lange, MD [place] [address]  Iron deficiency anemia due to chronic blood loss - Plan: CBC with Differential, Iron and TIBC, Ferritin, CBC with Differential, Iron and TIBC, Ferritin  CURRENT THERAPY: IV iron replacement as indicated  INTERVAL HISTORY: Carol Massey 82 y.o. 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 On her prior EGD gastric lesions were seen.  She continues to report right sided weakness that is stable to worse since March/April 2017.  She is advised that her lab work does not explain this unilateral weakness.  She is advised to follow-up with her primary care provider about this.  She otherwise denies any issues.  Review of Systems  Constitutional: Negative for chills, fever and weight loss.  HENT: Negative.  Negative for nosebleeds.   Eyes: Negative.   Respiratory: Negative.  Negative for hemoptysis.   Cardiovascular: Negative.  Negative for chest pain.  Gastrointestinal: Negative.  Negative for blood in stool and melena.  Genitourinary: Negative.  Negative for hematuria.  Musculoskeletal: Negative.   Skin: Negative.   Neurological: Positive for focal weakness (right sided (since March/April 2017)) and weakness (right leg and arm, chronic dating back to March 2017.).  Endo/Heme/Allergies: Negative.  Does not bruise/bleed easily.  Psychiatric/Behavioral: Negative.     Past Medical History:  Diagnosis Date  . Anemia   . COPD (chronic obstructive pulmonary disease) ([place])   . Dieulafoy lesion of colon 2006  . Gastric AVM 2014  . Generalized anxiety disorder 07/24/2015  . Hard of hearing   . Hypertension   . Iron deficiency anemia due to chronic blood loss 05/27/2014  . Major depression in remission ([place]) 07/24/2015  . Osteoporosis   . PUD (peptic ulcer disease) 11/27/2015   Gastric and duodenal ulcer on EGD  . Stroke ([place])    . Unsteady gait   . Upper GI bleed 11/27/2015    Past Surgical History:  Procedure Laterality Date  . ABDOMINAL HYSTERECTOMY    . APPENDECTOMY    . COLONOSCOPY  03/2005   Dr. Gala Romney: rectal and rectosigmoid polyps removed  . COLONOSCOPY WITH ESOPHAGOGASTRODUODENOSCOPY (EGD) N/A 08/12/2013   Dr. Gala Romney: colonoscopy with tubular adenoma, colonic diverticulosis. EGD with non-critical Schatzki's ring, non-manipulated, and gastric AVM with evidence of recent bleeding, s/p thermal sealing and hemostasis clip  . ESOPHAGEAL DILATION  11/27/2015   Procedure: ESOPHAGEAL DILATION;  Surgeon: Daneil Dolin, MD;  Location: [facility];  Service: Endoscopy;;  . ESOPHAGOGASTRODUODENOSCOPY  03/2005   Dr. Gala Romney: tiny distal esophageal erosions  . ESOPHAGOGASTRODUODENOSCOPY N/A 11/27/2015   RMR: Cricopharyngeus web, status post dilation. Nonbleeding duodenal and gastric ulcers without stigmata of bleeding. Gastric biopsies benign  . ESOPHAGOGASTRODUODENOSCOPY N/A 02/14/2016   Procedure: ESOPHAGOGASTRODUODENOSCOPY (EGD);  Surgeon: Daneil Dolin, MD;  Location: [facility];  Service: Endoscopy;  Laterality: N/A;  300  . GIVENS CAPSULE STUDY N/A 11/27/2015   NOT DONE, WAS NOT INDICATED AT THE TIME  . MALONEY DILATION N/A 02/14/2016   Procedure: Venia Minks DILATION;  Surgeon: Daneil Dolin, MD;  Location: [facility];  Service: Endoscopy;  Laterality: N/A;  . small bowel capsule study  03/2005   Dr. Laural Golden: two small Dieulafoy lesions at ascending colon. tiney specks of blood at duodenal bulb but no identified lesions  . TONSILLECTOMY      Family History  Problem Relation Age of Onset  .  Hypertension Mother   . Stroke Mother   . Leukemia Father 63  . Colon cancer Neg Hx     Social History   Social History  . Marital status: Married    Spouse name: N/A  . Number of children: 3  . Years of education: N/A   Occupational History  . retired from [employer] History Main Topics  . Smoking  status: Former Smoker    Packs/day: 0.50    Years: 70.00    Types: Cigarettes  . Smokeless tobacco: Never Used     Comment: about 1/2 ppd  . Alcohol use No  . Drug use: No  . Sexual activity: Not on file   Other Topics Concern  . Not on file   Social History Narrative  . No narrative on file     PHYSICAL EXAMINATION  ECOG PERFORMANCE STATUS: 2 - Symptomatic, <50% confined to bed  Vitals:   07/29/16 1029  BP: 134/62  Pulse: 62  Resp: 18  Temp: 98.1 F (36.7 C)    GENERAL:alert, comfortable, cooperative, smiling and in wheelchair, accompanied by her son. SKIN: skin color, texture, turgor are normal, no rashes or significant lesions HEAD: Normocephalic, No masses, lesions, tenderness or abnormalities EYES: normal, EOMI, Conjunctiva are pink and non-injected EARS: External ears normal OROPHARYNX:lips, buccal mucosa, and tongue normal and mucous membranes are moist  NECK: supple, trachea midline LYMPH:  no palpable lymphadenopathy BREAST:not examined LUNGS: clear to auscultation  HEART: regular rate & rhythm ABDOMEN:abdomen soft, non-tender and normal bowel sounds BACK: Back symmetric, no curvature. EXTREMITIES:less then 2 second capillary refill, no joint deformities, effusion, or inflammation, no skin discoloration, no cyanosis  NEURO: alert & oriented x 3 with fluent speech, no focal motor/sensory deficits, in a wheelchair   LABORATORY DATA: CBC    Component Value Date/Time   WBC 5.6 07/24/2016 1413   RBC 4.15 07/24/2016 1413   HGB 13.3 07/24/2016 1413   HCT 40.2 07/24/2016 1413   HCT 36.5 02/12/2016 1140   PLT 210 07/24/2016 1413   PLT 303 02/12/2016 1140   MCV 96.9 07/24/2016 1413   MCV 87 02/12/2016 1140   MCH 32.0 07/24/2016 1413   MCHC 33.1 07/24/2016 1413   RDW 12.8 07/24/2016 1413   RDW 19.2 (H) 02/12/2016 1140   LYMPHSABS 1.4 07/24/2016 1413   LYMPHSABS 1.7 02/12/2016 1140   MONOABS 0.6 07/24/2016 1413   EOSABS 0.1 07/24/2016 1413   EOSABS 0.1  02/12/2016 1140   BASOSABS 0.1 07/24/2016 1413   BASOSABS 0.1 02/12/2016 1140      Chemistry      Component Value Date/Time   NA 137 03/26/2016 0846   NA 138 05/22/2015 0948   K 4.1 03/26/2016 0846   CL 103 03/26/2016 0846   CO2 27 03/26/2016 0846   BUN 11 03/26/2016 0846   BUN 10 05/22/2015 0948   CREATININE 0.83 03/26/2016 0846   CREATININE 0.60 06/17/2013 1241      Component Value Date/Time   CALCIUM 9.3 03/26/2016 0846   ALKPHOS 54 11/27/2015 0734   AST 17 11/27/2015 0734   ALT 12 (L) 11/27/2015 0734   BILITOT 0.4 11/27/2015 0734     Lab Results  Component Value Date   IRON 92 06/27/2016   TIBC 295 06/27/2016   FERRITIN 72 07/24/2016     PENDING LABS:   RADIOGRAPHIC STUDIES:  No results found.   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 On her prior EGD gastric lesions were seen.  H/O medical noncompliance.  She does not have documented chronic renal disease documented and therefore, we cannot use ferric gluconate anymore due to insurance conflicts.  Labs from 07/24/2016 reviewed: CBC diff, iron/TIBC, ferritin.  I personally reviewed and went over laboratory results with the patient.  The results are noted within this dictation.  She is minimally iron deficient based upon her calculation, BUT her HGB is NORMAL.  She is scheduled for IV feraheme on 08/01/2016.  Labs every 4 weeks: CBC diff, iron/TIBC, ferritin.  Additional lab in 16 weeks: renal function panel.  The patient's son is interested in monthly labs as well.  She will follow-up with Dr. Wolfgang Phoenix regarding her right sided weakness that is chronic.  She is advised that her lab work does not explain this unilateral weakness that has been ongoing since March/April 2017.  She does have a history of TIAs.  Return in 16 weeks for follow-up.   ORDERS  PLACED FOR THIS ENCOUNTER: Orders Placed This Encounter  Procedures  . CBC with Differential  . Iron and TIBC  . Ferritin  . CBC with Differential  . Iron and TIBC  . Ferritin    MEDICATIONS PRESCRIBED THIS ENCOUNTER: No orders of the defined types were placed in this encounter.   THERAPY PLAN:  Will continue to monitor labs and iron studies and replace iron as indicated.  All questions were answered. The patient knows to call the clinic with any problems, questions or concerns. We can certainly see the patient much sooner if necessary.  Patient and plan discussed with Dr. Ancil Linsey and she is in agreement with the aforementioned.   This note is electronically signed by: Doy Mince 07/29/2016 10:57 AM

## 2016-08-01 ENCOUNTER — Encounter (HOSPITAL_BASED_OUTPATIENT_CLINIC_OR_DEPARTMENT_OTHER): Payer: Medicare Other

## 2016-08-01 VITALS — BP 151/52 | HR 54 | Temp 98.4°F | Resp 16

## 2016-08-01 DIAGNOSIS — D500 Iron deficiency anemia secondary to blood loss (chronic): Secondary | ICD-10-CM | POA: Diagnosis not present

## 2016-08-01 DIAGNOSIS — Q2733 Arteriovenous malformation of digestive system vessel: Secondary | ICD-10-CM | POA: Diagnosis not present

## 2016-08-01 MED ORDER — SODIUM CHLORIDE 0.9 % IV SOLN
Freq: Once | INTRAVENOUS | Status: AC
  Administered 2016-08-01: 14:00:00 via INTRAVENOUS

## 2016-08-01 MED ORDER — SODIUM CHLORIDE 0.9 % IV SOLN
510.0000 mg | Freq: Once | INTRAVENOUS | Status: AC
  Administered 2016-08-01: 510 mg via INTRAVENOUS
  Filled 2016-08-01: qty 17

## 2016-08-01 NOTE — Progress Notes (Signed)
IV feraheme given today per orders. Patient tolerated it well, no problems. Vitals stable and discharged from clinic via wheelchair with family at side.

## 2016-08-01 NOTE — Patient Instructions (Signed)
[facility] at [facility] Discharge Instructions  RECOMMENDATIONS MADE BY THE CONSULTANT AND ANY TEST RESULTS WILL BE SENT TO YOUR REFERRING PHYSICIAN.  IV iron given today . Follow up as scheduled.  Thank you for choosing [facility] at [facility] to provide your oncology and hematology care.  To afford each patient quality time with our provider, please arrive at least 15 minutes before your scheduled appointment time.   Beginning January 23rd 2017 lab work for the Cancer Center will be done in the  Main lab at [place] on 1st floor. If you have a lab appointment with the Cancer Center please come in thru the  Main Entrance and check in at the main information desk  You need to re-schedule your appointment should you arrive 10 or more minutes late.  We strive to give you quality time with our providers, and arriving late affects you and other patients whose appointments are after yours.  Also, if you no show three or more times for appointments you may be dismissed from the clinic at the providers discretion.     Again, thank you for choosing [facility].  Our hope is that these requests will decrease the amount of time that you wait before being seen by our physicians.       _____________________________________________________________  Should you have questions after your visit to [facility], please contact our office at [phone] between the hours of 8:30 a.m. and 4:30 p.m.  Voicemails left after 4:30 p.m. will not be returned until the following business day.  For prescription refill requests, have your pharmacy contact our office.         Resources For Cancer Patients and their Caregivers ? American Cancer Society: Can assist with transportation, wigs, general needs, runs Look Good Feel Better.        1-888-227-6333 ? Cancer Care: Provides financial assistance, online support groups,  medication/co-pay assistance.  1-800-813-HOPE (4673) ? [facility] Assists [place] cancer patients and their families through emotional , educational and financial support.  336-427-4357 ? [place] DSS Where to apply for food stamps, Medicaid and utility assistance. 336-342-1394 ? RCATS: Transportation to medical appointments. 336-347-2287 ? Social Security Administration: May apply for disability if have a Stage IV cancer. 336-342-7796 1-800-772-1213 ? [place] Aging, Disability and Transit Services: Assists with nutrition, care and transit needs. 336-349-2343  Cancer Center Support Programs: @10RELATIVEDAYS@ > Cancer Support Group  2nd Tuesday of the month 1pm-2pm, Journey Room  > Creative Journey  3rd Tuesday of the month 1130am-1pm, Journey Room  > Look Good Feel Better  1st Wednesday of the month 10am-12 noon, Journey Room (Call American Cancer Society to register 1-800-395-5775)   

## 2016-08-16 ENCOUNTER — Telehealth: Payer: Self-pay | Admitting: Nurse Practitioner

## 2016-08-16 NOTE — Telephone Encounter (Signed)
Patient's husband being seen today for urgent care visit. Her son requests a recheck of her hemoglobin to be sure it is not dropped too low. Being followed by hematology, had a recent iron infusion. Hemoglobin and office today is 11.4, the last 2 readings over the past few weeks and hematology were above 13. Son plans to contact hematology next week for recheck. Call back sooner if needed.

## 2016-08-20 ENCOUNTER — Telehealth (HOSPITAL_COMMUNITY): Payer: Self-pay | Admitting: *Deleted

## 2016-08-20 NOTE — Telephone Encounter (Signed)
Appointment made with son for patient to come in for lab work.

## 2016-08-21 ENCOUNTER — Encounter (HOSPITAL_COMMUNITY): Payer: Medicare Other

## 2016-08-21 DIAGNOSIS — D500 Iron deficiency anemia secondary to blood loss (chronic): Secondary | ICD-10-CM | POA: Diagnosis present

## 2016-08-21 LAB — CBC WITH DIFFERENTIAL/PLATELET
Basophils Absolute: 0.1 10*3/uL (ref 0.0–0.1)
Basophils Relative: 1 %
Eosinophils Absolute: 0.2 10*3/uL (ref 0.0–0.7)
Eosinophils Relative: 3 %
HEMATOCRIT: 37 % (ref 36.0–46.0)
HEMOGLOBIN: 12.1 g/dL (ref 12.0–15.0)
LYMPHS ABS: 1.2 10*3/uL (ref 0.7–4.0)
Lymphocytes Relative: 20 %
MCH: 32.3 pg (ref 26.0–34.0)
MCHC: 32.7 g/dL (ref 30.0–36.0)
MCV: 98.7 fL (ref 78.0–100.0)
MONOS PCT: 11 %
Monocytes Absolute: 0.7 10*3/uL (ref 0.1–1.0)
NEUTROS ABS: 4.1 10*3/uL (ref 1.7–7.7)
NEUTROS PCT: 65 %
Platelets: 248 10*3/uL (ref 150–400)
RBC: 3.75 MIL/uL — ABNORMAL LOW (ref 3.87–5.11)
RDW: 12.9 % (ref 11.5–15.5)
WBC: 6.2 10*3/uL (ref 4.0–10.5)

## 2016-08-21 LAB — IRON AND TIBC
Iron: 33 ug/dL (ref 28–170)
Saturation Ratios: 13 % (ref 10.4–31.8)
TIBC: 252 ug/dL (ref 250–450)
UIBC: 219 ug/dL

## 2016-08-21 LAB — FERRITIN: FERRITIN: 665 ng/mL — AB (ref 11–307)

## 2016-08-29 ENCOUNTER — Encounter (HOSPITAL_COMMUNITY): Payer: Medicare Other | Attending: Hematology & Oncology

## 2016-08-29 DIAGNOSIS — D500 Iron deficiency anemia secondary to blood loss (chronic): Secondary | ICD-10-CM | POA: Insufficient documentation

## 2016-08-29 LAB — IRON AND TIBC
IRON: 65 ug/dL (ref 28–170)
Saturation Ratios: 21 % (ref 10.4–31.8)
TIBC: 308 ug/dL (ref 250–450)
UIBC: 243 ug/dL

## 2016-08-29 LAB — CBC WITH DIFFERENTIAL/PLATELET
Basophils Absolute: 0.1 10*3/uL (ref 0.0–0.1)
Basophils Relative: 1 %
EOS ABS: 0.1 10*3/uL (ref 0.0–0.7)
EOS PCT: 2 %
HCT: 37.4 % (ref 36.0–46.0)
Hemoglobin: 12.4 g/dL (ref 12.0–15.0)
LYMPHS ABS: 1.8 10*3/uL (ref 0.7–4.0)
Lymphocytes Relative: 21 %
MCH: 32.9 pg (ref 26.0–34.0)
MCHC: 33.2 g/dL (ref 30.0–36.0)
MCV: 99.2 fL (ref 78.0–100.0)
Monocytes Absolute: 0.6 10*3/uL (ref 0.1–1.0)
Monocytes Relative: 7 %
Neutro Abs: 6 10*3/uL (ref 1.7–7.7)
Neutrophils Relative %: 69 %
PLATELETS: 279 10*3/uL (ref 150–400)
RBC: 3.77 MIL/uL — AB (ref 3.87–5.11)
RDW: 13 % (ref 11.5–15.5)
WBC: 8.6 10*3/uL (ref 4.0–10.5)

## 2016-08-29 LAB — FERRITIN: Ferritin: 311 ng/mL — ABNORMAL HIGH (ref 11–307)

## 2016-09-16 ENCOUNTER — Telehealth: Payer: Self-pay

## 2016-09-16 MED ORDER — PANTOPRAZOLE SODIUM 40 MG PO TBEC: 40.0000 mg | DELAYED_RELEASE_TABLET | Freq: Every day | ORAL | 5 refills | Status: DC

## 2016-09-16 NOTE — Telephone Encounter (Signed)
pts son called and left a voicemail- pts insurance will no longer cover her protonix bid. They are requesting a new rx sent in for once daily.

## 2016-09-16 NOTE — Telephone Encounter (Signed)
RX printed and I have faxed it to [facility] and pt's son is aware.

## 2016-09-16 NOTE — Telephone Encounter (Signed)
Rx changed and sent to pharmacy. Please notify patient.

## 2016-09-20 ENCOUNTER — Ambulatory Visit: Payer: Medicare Other | Admitting: Family Medicine

## 2016-09-25 ENCOUNTER — Other Ambulatory Visit: Payer: Self-pay | Admitting: Family Medicine

## 2016-09-25 NOTE — Telephone Encounter (Signed)
May have 4 refills on each 

## 2016-09-30 ENCOUNTER — Ambulatory Visit (INDEPENDENT_AMBULATORY_CARE_PROVIDER_SITE_OTHER): Payer: Medicare Other | Admitting: Family Medicine

## 2016-09-30 ENCOUNTER — Encounter: Payer: Self-pay | Admitting: Family Medicine

## 2016-09-30 ENCOUNTER — Other Ambulatory Visit (HOSPITAL_COMMUNITY): Payer: Self-pay | Admitting: Oncology

## 2016-09-30 ENCOUNTER — Encounter (HOSPITAL_COMMUNITY): Payer: Medicare Other | Attending: Oncology

## 2016-09-30 VITALS — BP 100/70 | Ht 60.0 in | Wt 102.5 lb

## 2016-09-30 DIAGNOSIS — D500 Iron deficiency anemia secondary to blood loss (chronic): Secondary | ICD-10-CM | POA: Diagnosis not present

## 2016-09-30 DIAGNOSIS — J438 Other emphysema: Secondary | ICD-10-CM

## 2016-09-30 LAB — CBC WITH DIFFERENTIAL/PLATELET
BASOS ABS: 0.1 10*3/uL (ref 0.0–0.1)
BASOS PCT: 1 %
EOS PCT: 2 %
Eosinophils Absolute: 0.1 10*3/uL (ref 0.0–0.7)
HCT: 38.6 % (ref 36.0–46.0)
Hemoglobin: 12.9 g/dL (ref 12.0–15.0)
LYMPHS PCT: 29 %
Lymphs Abs: 1.5 10*3/uL (ref 0.7–4.0)
MCH: 32.4 pg (ref 26.0–34.0)
MCHC: 33.4 g/dL (ref 30.0–36.0)
MCV: 97 fL (ref 78.0–100.0)
Monocytes Absolute: 0.4 10*3/uL (ref 0.1–1.0)
Monocytes Relative: 8 %
Neutro Abs: 3.2 10*3/uL (ref 1.7–7.7)
Neutrophils Relative %: 60 %
Platelets: 208 10*3/uL (ref 150–400)
RBC: 3.98 MIL/uL (ref 3.87–5.11)
RDW: 13.1 % (ref 11.5–15.5)
WBC: 5.3 10*3/uL (ref 4.0–10.5)

## 2016-09-30 LAB — IRON AND TIBC
IRON: 55 ug/dL (ref 28–170)
SATURATION RATIOS: 18 % (ref 10.4–31.8)
TIBC: 308 ug/dL (ref 250–450)
UIBC: 253 ug/dL

## 2016-09-30 LAB — FERRITIN: Ferritin: 98 ng/mL (ref 11–307)

## 2016-09-30 NOTE — Progress Notes (Signed)
   Subjective:    Patient ID: Carol Massey, female    DOB: 01-12-1926, 83 y.o.   MRN: [id number]  HPI Patient is here today for a follow up visit on anemia.  Patient had blood work completed this morning. She would like the results.   Patient is having pain on her whole right side.Intermittent pain down the right leg. Also at times intermittent pain in the right arm. No headaches. No nausea vomiting.   Patient is having trouble with controlling bowel movements also. Has happened 2 times in the past month. Sometimes when she eats certain foods like coleslaw she tends to have more loose stools   Review of Systems She denies bloody stools denies wheezing difficulty breathing vomiting or diarrhea except what is listed above    Objective:   Physical Exam Lungs are clear hearts regular pulse normal extremities no edema skin warm dry  Her emphysema stable.     Assessment & Plan:  Right leg sciatica intermittent Tylenol when necessary  Right arm weakness persisted Mabe had a small stroke in the past nothing acute currently I do not feel the patient needs any type of scan  Hemoglobin stable on her bloodwork she will follow-up with hematology for this  Follow-up here 4-5 months  Occasional loose stools-low refuse diet this should help if ever having any bloody stools or other problems follow-up immediately here or ER if from urgent  Patient did quit smoking which is excellent

## 2016-10-10 ENCOUNTER — Encounter (HOSPITAL_BASED_OUTPATIENT_CLINIC_OR_DEPARTMENT_OTHER): Payer: Medicare Other

## 2016-10-10 VITALS — BP 155/53 | HR 59 | Temp 98.3°F | Resp 20

## 2016-10-10 DIAGNOSIS — Q2733 Arteriovenous malformation of digestive system vessel: Secondary | ICD-10-CM | POA: Diagnosis not present

## 2016-10-10 DIAGNOSIS — D500 Iron deficiency anemia secondary to blood loss (chronic): Secondary | ICD-10-CM | POA: Diagnosis not present

## 2016-10-10 MED ORDER — SODIUM CHLORIDE 0.9 % IV SOLN
Freq: Once | INTRAVENOUS | Status: AC
Start: 2016-10-10 — End: 2016-10-10
  Administered 2016-10-10: 14:00:00 via INTRAVENOUS

## 2016-10-10 MED ORDER — SODIUM CHLORIDE 0.9 % IV SOLN
510.0000 mg | Freq: Once | INTRAVENOUS | Status: AC
  Administered 2016-10-10: 510 mg via INTRAVENOUS
  Filled 2016-10-10: qty 17

## 2016-10-10 NOTE — Progress Notes (Signed)
Carol Massey tolerated Feraheme infusion well but did mention a warm almost burning feeling in her upper right and lower right leg during the infusion which she reported comes and goes and she had experienced before. IV site with good blood return and skin to these areas appear WNL's.Pt denied any other issues during infusion VSS upon discharge. Pt discharged via wheelchair in satisfactory condition accompanied by her son

## 2016-10-10 NOTE — Patient Instructions (Signed)
[facility] at [facility] Discharge Instructions  RECOMMENDATIONS MADE BY THE CONSULTANT AND ANY TEST RESULTS WILL BE SENT TO YOUR REFERRING PHYSICIAN. Received Feraheme today. Follow-up as scheduled. Call clinic for any questions or concerns  Thank you for choosing [facility] at [facility] to provide your oncology and hematology care.  To afford each patient quality time with our provider, please arrive at least 15 minutes before your scheduled appointment time.    If you have a lab appointment with the Cancer Center please come in thru the  Main Entrance and check in at the main information desk  You need to re-schedule your appointment should you arrive 10 or more minutes late.  We strive to give you quality time with our providers, and arriving late affects you and other patients whose appointments are after yours.  Also, if you no show three or more times for appointments you may be dismissed from the clinic at the providers discretion.     Again, thank you for choosing [facility].  Our hope is that these requests will decrease the amount of time that you wait before being seen by our physicians.       _____________________________________________________________  Should you have questions after your visit to [facility], please contact our office at [phone] between the hours of 8:30 a.m. and 4:30 p.m.  Voicemails left after 4:30 p.m. will not be returned until the following business day.  For prescription refill requests, have your pharmacy contact our office.       Resources For Cancer Patients and their Caregivers ? American Cancer Society: Can assist with transportation, wigs, general needs, runs Look Good Feel Better.        1-888-227-6333 ? Cancer Care: Provides financial assistance, online support groups, medication/co-pay assistance.  1-800-813-HOPE (4673) ? [facility] Assists [place] cancer patients and their families through emotional , educational and financial support.  336-427-4357 ? [place] DSS Where to apply for food stamps, Medicaid and utility assistance. 336-342-1394 ? RCATS: Transportation to medical appointments. 336-347-2287 ? Social Security Administration: May apply for disability if have a Stage IV cancer. 336-342-7796 1-800-772-1213 ? [place] Aging, Disability and Transit Services: Assists with nutrition, care and transit needs. 336-349-2343  Cancer Center Support Programs: @10RELATIVEDAYS@ > Cancer Support Group  2nd Tuesday of the month 1pm-2pm, Journey Room  > Creative Journey  3rd Tuesday of the month 1130am-1pm, Journey Room  > Look Good Feel Better  1st Wednesday of the month 10am-12 noon, Journey Room (Call American Cancer Society to register 1-800-395-5775)   

## 2016-10-28 ENCOUNTER — Encounter (HOSPITAL_COMMUNITY): Payer: Medicare Other | Attending: Oncology

## 2016-10-28 DIAGNOSIS — D500 Iron deficiency anemia secondary to blood loss (chronic): Secondary | ICD-10-CM | POA: Diagnosis present

## 2016-10-28 LAB — CBC WITH DIFFERENTIAL/PLATELET
BASOS ABS: 0 10*3/uL (ref 0.0–0.1)
Basophils Relative: 1 %
Eosinophils Absolute: 0.1 10*3/uL (ref 0.0–0.7)
Eosinophils Relative: 2 %
HEMATOCRIT: 42 % (ref 36.0–46.0)
Hemoglobin: 13.4 g/dL (ref 12.0–15.0)
Lymphocytes Relative: 26 %
Lymphs Abs: 1.4 10*3/uL (ref 0.7–4.0)
MCH: 31.2 pg (ref 26.0–34.0)
MCHC: 31.9 g/dL (ref 30.0–36.0)
MCV: 97.9 fL (ref 78.0–100.0)
MONO ABS: 0.5 10*3/uL (ref 0.1–1.0)
Monocytes Relative: 10 %
Neutro Abs: 3.2 10*3/uL (ref 1.7–7.7)
Neutrophils Relative %: 61 %
Platelets: 192 10*3/uL (ref 150–400)
RBC: 4.29 MIL/uL (ref 3.87–5.11)
RDW: 12.5 % (ref 11.5–15.5)
WBC: 5.2 10*3/uL (ref 4.0–10.5)

## 2016-10-28 LAB — FERRITIN: FERRITIN: 486 ng/mL — AB (ref 11–307)

## 2016-10-28 LAB — IRON AND TIBC
Iron: 77 ug/dL (ref 28–170)
SATURATION RATIOS: 28 % (ref 10.4–31.8)
TIBC: 280 ug/dL (ref 250–450)
UIBC: 203 ug/dL

## 2016-11-15 ENCOUNTER — Encounter: Payer: Self-pay | Admitting: Family Medicine

## 2016-11-15 ENCOUNTER — Ambulatory Visit (INDEPENDENT_AMBULATORY_CARE_PROVIDER_SITE_OTHER): Payer: Medicare Other | Admitting: Family Medicine

## 2016-11-15 ENCOUNTER — Ambulatory Visit (HOSPITAL_COMMUNITY)
Admission: RE | Admit: 2016-11-15 | Discharge: 2016-11-15 | Disposition: A | Discharge status: Home / Self Care | Payer: Medicare Other | Source: Ambulatory Visit | Attending: Family Medicine | Admitting: Family Medicine

## 2016-11-15 VITALS — BP 100/58 | Ht 60.0 in | Wt 102.0 lb

## 2016-11-15 DIAGNOSIS — S0093XA Contusion of unspecified part of head, initial encounter: Secondary | ICD-10-CM

## 2016-11-15 DIAGNOSIS — S8011XA Contusion of right lower leg, initial encounter: Secondary | ICD-10-CM | POA: Diagnosis not present

## 2016-11-15 DIAGNOSIS — M79604 Pain in right leg: Secondary | ICD-10-CM | POA: Insufficient documentation

## 2016-11-15 NOTE — Progress Notes (Signed)
   Subjective:    Patient ID: Carol Massey, female    DOB: 1926-01-07, 83 y.o.   MRN: [id number]  HPI Patient arrives with c/o fall last night.Patient did not have any loss of consciousness. She fell at home. She recalls what happened. She was able to get up and get her family's attention. She describes pain to the right side of her face and right lower leg she denies headache denies loss of consciousness denies vomiting. The son was a little concerned that she seemed a little confused earlier today but she does not seem to be currently according to the son  patient had a fall she landed n her face she had Review of Systems Please see above    Objective:   Physical Exam Bruising on right side face no hematoma on the scalp patient oriented she was her usual self lungs clear heart regular bruising on the right lower leg with tenderness to palpation. Able to walk.       Assessment & Plan:  Leg contusion x-rays ordered Facial contusion no need for x-rays Possible mild concussion but I doubt anything serious Warning signs regarding intracranial injury were discussed in detail if these occur immediately go to ER. No need for CAT scan currently.

## 2016-11-28 ENCOUNTER — Other Ambulatory Visit (HOSPITAL_COMMUNITY): Payer: Medicare Other

## 2016-11-28 ENCOUNTER — Ambulatory Visit (HOSPITAL_COMMUNITY): Payer: Medicare Other | Admitting: Oncology

## 2016-12-03 ENCOUNTER — Encounter (HOSPITAL_COMMUNITY): Payer: Medicare Other

## 2016-12-03 ENCOUNTER — Encounter (HOSPITAL_COMMUNITY): Payer: Self-pay | Admitting: Oncology

## 2016-12-03 ENCOUNTER — Encounter (HOSPITAL_COMMUNITY): Payer: Medicare Other | Attending: Oncology | Admitting: Oncology

## 2016-12-03 VITALS — BP 173/66 | HR 64 | Temp 98.2°F | Resp 16 | Ht 60.0 in | Wt 101.9 lb

## 2016-12-03 DIAGNOSIS — Q2733 Arteriovenous malformation of digestive system vessel: Secondary | ICD-10-CM | POA: Diagnosis not present

## 2016-12-03 DIAGNOSIS — D500 Iron deficiency anemia secondary to blood loss (chronic): Secondary | ICD-10-CM | POA: Diagnosis present

## 2016-12-03 DIAGNOSIS — E876 Hypokalemia: Secondary | ICD-10-CM | POA: Diagnosis not present

## 2016-12-03 LAB — RENAL FUNCTION PANEL
Albumin: 3.9 g/dL (ref 3.5–5.0)
Anion gap: 8 (ref 5–15)
BUN: 14 mg/dL (ref 6–20)
CALCIUM: 9.6 mg/dL (ref 8.9–10.3)
CO2: 28 mmol/L (ref 22–32)
CREATININE: 0.82 mg/dL (ref 0.44–1.00)
Chloride: 102 mmol/L (ref 101–111)
GFR calc Af Amer: >60 mL/min (ref >60)
Glucose, Bld: 115 mg/dL — ABNORMAL HIGH (ref 65–99)
PHOSPHORUS: 3.3 mg/dL (ref 2.5–4.6)
Potassium: 3.3 mmol/L — ABNORMAL LOW (ref 3.5–5.1)
SODIUM: 138 mmol/L (ref 135–145)

## 2016-12-03 LAB — CBC
HEMATOCRIT: 42 % (ref 36.0–46.0)
Hemoglobin: 14 g/dL (ref 12.0–15.0)
MCH: 32.2 pg (ref 26.0–34.0)
MCHC: 33.3 g/dL (ref 30.0–36.0)
MCV: 96.6 fL (ref 78.0–100.0)
Platelets: 190 10*3/uL (ref 150–400)
RBC: 4.35 MIL/uL (ref 3.87–5.11)
RDW: 12.7 % (ref 11.5–15.5)
WBC: 5.4 10*3/uL (ref 4.0–10.5)

## 2016-12-03 LAB — FERRITIN: Ferritin: 183 ng/mL (ref 11–307)

## 2016-12-03 MED ORDER — POTASSIUM CHLORIDE CRYS ER 20 MEQ PO TBCR: 20.0000 meq | EXTENDED_RELEASE_TABLET | Freq: Every day | ORAL | 0 refills | Status: DC

## 2016-12-03 NOTE — Patient Instructions (Signed)
 at [facility] Discharge Instructions  RECOMMENDATIONS MADE BY THE CONSULTANT AND ANY TEST RESULTS WILL BE SENT TO YOUR REFERRING PHYSICIAN.  You were seen today by [name], PA-C Lab work every 4 weeks Follow up in 4 months Take potassium by mouth daily for 2 weeks See [name] up front for appointments   Thank you for choosing [place] at [facility] to provide your oncology and hematology care.  To afford each patient quality time with our provider, please arrive at least 15 minutes before your scheduled appointment time.    If you have a lab appointment with the [facility] please come in thru the  Main Entrance and check in at the main information desk  You need to re-schedule your appointment should you arrive 10 or more minutes late.  We strive to give you quality time with our providers, and arriving late affects you and other patients whose appointments are after yours.  Also, if you no show three or more times for appointments you may be dismissed from the clinic at the providers discretion.     Again, thank you for choosing [facility].  Our hope is that these requests will decrease the amount of time that you wait before being seen by our physicians.       _____________________________________________________________  Should you have questions after your visit to [facility], please contact our office at [phone] between the hours of 8:30 a.m. and 4:30 p.m.  Voicemails left after 4:30 p.m. will not be returned until the following business day.  For prescription refill requests, have your pharmacy contact our office.       Resources For Cancer Patients and their Caregivers ? American Cancer Society: Can assist with transportation, wigs, general needs, runs Look Good Feel Better.        (279) 745-4512 ? Cancer Care: Provides financial assistance, online support groups,  medication/co-pay assistance.  1-800-813-HOPE (254) 085-7320) ? [facility] Assists [place] cancer patients and their families through emotional , educational and financial support.  3606667460 ? [place] DSS Where to apply for food stamps, Medicaid and utility assistance. 613-292-1566 ? RCATS: Transportation to medical appointments. 317-538-6620 ? Social Security Administration: May apply for disability if have a Stage IV cancer. (681) 474-4938 (216)024-5371 ? [employer], Disability and Transit Services: Assists with nutrition, care and transit needs. [facility] Support Programs: @10RELATIVEDAYS @ > Cancer Support Group  2nd Tuesday of the month 1pm-2pm, Journey Room  > Creative Journey  3rd Tuesday of the month 1130am-1pm, Journey Room  > Look Good Feel Better  1st Wednesday of the month 10am-12 noon, Journey Room (Call [place] to register [phone])

## 2016-12-03 NOTE — Assessment & Plan Note (Addendum)
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 On her prior EGD gastric lesions were seen.  H/O medical noncompliance.  Since being seen last in December, her monthly labs have been reviewed and on two occassions, she was found to be iron deficient requiring 2 doses of IV iron replacement therapy: Oncology Flowsheet 08/01/2016 10/10/2016  ferumoxytol [facility]) IV 510 mg 510 mg   Labs from 10/28/2016: CBC diff, iron/TIBC, ferritin.  I personally reviewed and went over laboratory results with the patient.  The results are noted within this dictation.  Labs today: renal function panel.  I am unsure why no other labs were drawn.  I have called the lab and discussed with Hillary.  She confirms that a CBC and ferritin can be added to labs drawn today.  Orders are placed and released.  Hypokalemia is noted today.  Kdur 20 mEq daily is prescribed x 2 weeks.  Will defer further management and evaluation to PCP.  Labs every 4 weeks: CBC, iron/TIBC, ferritin.  Labs in 4 months: CBC diff, BMET, iron/TIBC, ferritin.  Vitals today demonstrate a hypertension above her baseline.  She is asymptomatic at this time.  She also reports increased fatigue over the past 3 weeks.  She has a follow-up appointment with her primary care provider in June 2018.  We will see if we can get her an appointment sooner than scheduled for follow-up regarding these issues.  Chart is reviewed and xrays are noted.  I personally reviewed and went over radiographic studies with the patient.  The results are noted within this dictation.  No evidence of fracture of right tib/fib.  Problem list reviewed with patient and edited accordingly.  Medications are reviewed with the patient and edited accordingly.  Return in 4 months for follow-up.  More than 50% of the time spent with the patient was utilized for counseling and coordination of care.

## 2016-12-03 NOTE — Progress Notes (Signed)
Carol Lange, MD [place] [address]  Iron deficiency anemia due to chronic blood loss - Plan: CBC, Ferritin, CBC, Iron and TIBC, Ferritin, CBC with Differential, Iron and TIBC, Ferritin, CBC with Differential, Basic metabolic panel, Iron and TIBC, Ferritin, CBC, Ferritin  Hypokalemia - Plan: potassium chloride SA (K-DUR,KLOR-CON) 20 MEQ tablet  CURRENT THERAPY: IV iron replacement as indicated  INTERVAL HISTORY: Carol Massey 83 y.o. 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 On her prior EGD gastric lesions were seen.  Approximately 2-3 weeks ago, the patient fell.  She reports that she got up from a sitting position without the assistance of her walker.  She lost her balance and fell.  She thinks she fell on the heating baseboard of her house.  She does have a right lower extremity pain is improving.  She did undergo x-rays which were negative for any fracture.  Her ecchymosis is certainly improving according to the patient.  Of note, her blood pressure today is higher than typical.  Therefore, we will try to get her an appoint with her primary care physician sooner than planned.  From a cardiac standpoint, she is asymptomatic.  She also reports increased fatigue and tiredness.  Based upon her past CBCs and iron levels, I do not think this is iron deficiency related.  I will defer this also to her primary care physician.  There is no doubt that her age is playing a part in this as well.  She denies any blood in her stools or black stools.  She denies any hemoptysis or hematemesis.  She denies any gross hematuria.  She denies any blood loss.  I have encouraged her to use her walker at all times at home.  Review of Systems  Constitutional: Positive for malaise/fatigue. Negative for chills, fever and weight loss.  HENT: Negative.  Negative for nosebleeds.     Eyes: Negative.   Respiratory: Negative.  Negative for hemoptysis.   Cardiovascular: Negative.  Negative for chest pain.  Gastrointestinal: Negative.  Negative for blood in stool and melena.  Genitourinary: Negative.  Negative for hematuria.  Musculoskeletal: Positive for falls.  Skin: Negative.   Neurological: Positive for focal weakness (right sided (since March/April 2017)) and weakness (right leg and arm, chronic dating back to March 2017.).  Endo/Heme/Allergies: Negative.  Does not bruise/bleed easily.  Psychiatric/Behavioral: Negative.     Past Medical History:  Diagnosis Date  . Anemia   . COPD (chronic obstructive pulmonary disease) ([place])   . Dieulafoy lesion of colon 2006  . Gastric AVM 2014  . Generalized anxiety disorder 07/24/2015  . Hard of hearing   . Hypertension   . Iron deficiency anemia due to chronic blood loss 05/27/2014  . Major depression in remission ([place] [facility]) 07/24/2015  . Osteoporosis   . PUD (peptic ulcer disease) 11/27/2015   Gastric and duodenal ulcer on EGD  . Stroke ([place])   . Unsteady gait   . Upper GI bleed 11/27/2015    Past Surgical History:  Procedure Laterality Date  . ABDOMINAL HYSTERECTOMY    . APPENDECTOMY    . COLONOSCOPY  03/2005   Dr. Gala Romney: rectal and rectosigmoid polyps removed  . COLONOSCOPY WITH ESOPHAGOGASTRODUODENOSCOPY (EGD) N/A 08/12/2013   Dr. Gala Romney: colonoscopy with tubular adenoma, colonic diverticulosis. EGD with non-critical Schatzki's ring, non-manipulated, and gastric AVM with evidence of recent bleeding, s/p thermal  sealing and hemostasis clip  . ESOPHAGEAL DILATION  11/27/2015   Procedure: ESOPHAGEAL DILATION;  Surgeon: Daneil Dolin, MD;  Location: [facility];  Service: Endoscopy;;  . ESOPHAGOGASTRODUODENOSCOPY  03/2005   Dr. Gala Romney: tiny distal esophageal erosions  . ESOPHAGOGASTRODUODENOSCOPY N/A 11/27/2015   RMR: Cricopharyngeus web, status post dilation. Nonbleeding duodenal and gastric ulcers without stigmata of  bleeding. Gastric biopsies benign  . ESOPHAGOGASTRODUODENOSCOPY N/A 02/14/2016   Procedure: ESOPHAGOGASTRODUODENOSCOPY (EGD);  Surgeon: Daneil Dolin, MD;  Location: [facility];  Service: Endoscopy;  Laterality: N/A;  300  . GIVENS CAPSULE STUDY N/A 11/27/2015   NOT DONE, WAS NOT INDICATED AT THE TIME  . MALONEY DILATION N/A 02/14/2016   Procedure: Venia Minks DILATION;  Surgeon: Daneil Dolin, MD;  Location: [facility];  Service: Endoscopy;  Laterality: N/A;  . small bowel capsule study  03/2005   Dr. Laural Golden: two small Dieulafoy lesions at ascending colon. tiney specks of blood at duodenal bulb but no identified lesions  . TONSILLECTOMY      Family History  Problem Relation Age of Onset  . Hypertension Mother   . Stroke Mother   . Leukemia Father 46  . Colon cancer Neg Hx     Social History   Social History  . Marital status: Married    Spouse name: N/A  . Number of children: 3  . Years of education: N/A   Occupational History  . retired from [employer] History Main Topics  . Smoking status: Former Smoker    Packs/day: 0.50    Years: 70.00    Types: Cigarettes  . Smokeless tobacco: Never Used     Comment: about 1/2 ppd  . Alcohol use No  . Drug use: No  . Sexual activity: Not on file   Other Topics Concern  . Not on file   Social History Narrative  . No narrative on file     PHYSICAL EXAMINATION  ECOG PERFORMANCE STATUS: 2 - Symptomatic, <50% confined to bed  There were no vitals filed for this visit.  GENERAL:alert, comfortable, cooperative, smiling and in wheelchair, accompanied by her son and husband. SKIN: skin color, texture, turgor are normal, no rashes or significant lesions.  Right lower extremity ecchymosis nearly resolved with patient reported tenderness on the anterolateral lower third of right lower leg. HEAD: Normocephalic, No masses, lesions, tenderness or abnormalities EYES: normal, EOMI, Conjunctiva are pink and  non-injected EARS: External ears normal OROPHARYNX:lips, buccal mucosa, and tongue normal and mucous membranes are moist  NECK: supple, trachea midline LYMPH:  no palpable lymphadenopathy BREAST:not examined LUNGS: clear to auscultation without wheezes, rales, or rhonchi. HEART: regular rate & rhythm ABDOMEN:abdomen soft, non-tender and normal bowel sounds BACK: Back symmetric, no curvature. EXTREMITIES:less then 2 second capillary refill, no joint deformities, effusion, or inflammation, no skin discoloration, no cyanosis  NEURO: alert & oriented x 3 with fluent speech, no focal motor/sensory deficits, in a wheelchair   LABORATORY DATA: CBC    Component Value Date/Time   WBC 5.4 12/03/2016 1326   RBC 4.35 12/03/2016 1326   HGB 14.0 12/03/2016 1326   HCT 42.0 12/03/2016 1326   HCT 36.5 02/12/2016 1140   PLT 190 12/03/2016 1326   PLT 303 02/12/2016 1140   MCV 96.6 12/03/2016 1326   MCV 87 02/12/2016 1140   MCH 32.2 12/03/2016 1326   MCHC 33.3 12/03/2016 1326   RDW 12.7 12/03/2016 1326   RDW 19.2 (H) 02/12/2016 1140  LYMPHSABS 1.4 10/28/2016 1112   LYMPHSABS 1.7 02/12/2016 1140   MONOABS 0.5 10/28/2016 1112   EOSABS 0.1 10/28/2016 1112   EOSABS 0.1 02/12/2016 1140   BASOSABS 0.0 10/28/2016 1112   BASOSABS 0.1 02/12/2016 1140      Chemistry      Component Value Date/Time   NA 138 12/03/2016 1324   NA 138 05/22/2015 0948   K 3.3 (L) 12/03/2016 1324   CL 102 12/03/2016 1324   CO2 28 12/03/2016 1324   BUN 14 12/03/2016 1324   BUN 10 05/22/2015 0948   CREATININE 0.82 12/03/2016 1324   CREATININE 0.60 06/17/2013 1241      Component Value Date/Time   CALCIUM 9.6 12/03/2016 1324   ALKPHOS 54 11/27/2015 0734   AST 17 11/27/2015 0734   ALT 12 (L) 11/27/2015 0734   BILITOT 0.4 11/27/2015 0734     Lab Results  Component Value Date   IRON 77 10/28/2016   TIBC 280 10/28/2016   FERRITIN 486 (H) 10/28/2016     PENDING LABS:   RADIOGRAPHIC STUDIES:  Dg  Tibia/fibula Right  Result Date: 11/15/2016 CLINICAL DATA:  Recent fall with right leg pain, initial encounter EXAM: RIGHT TIBIA AND FIBULA - 2 VIEW COMPARISON:  None. FINDINGS: There is no evidence of fracture or other focal bone lesions. Soft tissues are unremarkable. IMPRESSION: No acute abnormality noted. Electronically Signed   By: Inez Catalina M.D.   On: 11/15/2016 09:50     PATHOLOGY:    ASSESSMENT AND PLAN:  Iron deficiency anemia due to chronic blood loss Iron deficiency anemia secondary to chronic blood loss from AV malformations of the gastrointestinal tract. On her prior EGD gastric lesions were seen.  H/O medical noncompliance.  Since being seen last in December, her monthly labs have been reviewed and on two occassions, she was found to be iron deficient requiring 2 doses of IV iron replacement therapy: Oncology Flowsheet 08/01/2016 10/10/2016  ferumoxytol [facility]) IV 510 mg 510 mg   Labs from 10/28/2016: CBC diff, iron/TIBC, ferritin.  I personally reviewed and went over laboratory results with the patient.  The results are noted within this dictation.  Labs today: renal function panel.  I am unsure why no other labs were drawn.  I have called the lab and discussed with Hillary.  She confirms that a CBC and ferritin can be added to labs drawn today.  Orders are placed and released.  Hypokalemia is noted today.  Kdur 20 mEq daily is prescribed x 2 weeks.  Will defer further management and evaluation to PCP.  Labs every 4 weeks: CBC, iron/TIBC, ferritin.  Labs in 4 months: CBC diff, BMET, iron/TIBC, ferritin.  Vitals today demonstrate a hypertension above her baseline.  She is asymptomatic at this time.  She also reports increased fatigue over the past 3 weeks.  She has a follow-up appointment with her primary care provider in June 2018.  We will see if we can get her an appointment sooner than scheduled for follow-up regarding these issues.  Chart is reviewed and xrays are  noted.  I personally reviewed and went over radiographic studies with the patient.  The results are noted within this dictation.  No evidence of fracture of right tib/fib.  Problem list reviewed with patient and edited accordingly.  Medications are reviewed with the patient and edited accordingly.  Return in 4 months for follow-up.  More than 50% of the time spent with the patient was utilized for counseling and coordination of  care.   ORDERS PLACED FOR THIS ENCOUNTER: Orders Placed This Encounter  Procedures  . CBC  . Ferritin  . CBC  . Iron and TIBC  . Ferritin  . CBC with Differential  . Iron and TIBC  . Ferritin  . CBC with Differential  . Basic metabolic panel  . Iron and TIBC  . Ferritin    MEDICATIONS PRESCRIBED THIS ENCOUNTER: Meds ordered this encounter  Medications  . potassium chloride SA (K-DUR,KLOR-CON) 20 MEQ tablet    Sig: Take 1 tablet (20 mEq total) by mouth daily.    Dispense:  14 tablet    Refill:  0    Order Specific Question:   Supervising Provider    Answer:   Brunetta Genera [[id number]]    THERAPY PLAN:  Will continue to monitor labs and iron studies and replace iron as indicated.  All questions were answered. The patient knows to call the clinic with any problems, questions or concerns. We can certainly see the patient much sooner if necessary.  Patient and plan discussed with Dr. Twana First and she is in agreement with the aforementioned.   This note is electronically signed by: Doy Mince 12/03/2016 2:46 PM

## 2016-12-12 ENCOUNTER — Encounter: Payer: Self-pay | Admitting: Family Medicine

## 2016-12-12 ENCOUNTER — Ambulatory Visit (INDEPENDENT_AMBULATORY_CARE_PROVIDER_SITE_OTHER): Payer: Medicare Other | Admitting: Family Medicine

## 2016-12-12 VITALS — BP 166/86 | Ht 60.0 in | Wt 99.6 lb

## 2016-12-12 DIAGNOSIS — I10 Essential (primary) hypertension: Secondary | ICD-10-CM | POA: Diagnosis not present

## 2016-12-12 MED ORDER — AMLODIPINE BESYLATE 2.5 MG PO TABS: 2.5000 mg | ORAL_TABLET | Freq: Every day | ORAL | 5 refills | Status: DC

## 2016-12-12 NOTE — Progress Notes (Addendum)
   Subjective:    Patient ID: Carol Massey, female    DOB: 05/19/1926, 83 y.o.   MRN: [id number]  HPI  Patient arrives with c/o elevated blood pressure and disorientation during visit at cancer center. Patient had significant elevated blood pressure at the cancer center. Her son is also check blood pressure several times in 166/80 range no chest tightness pressure pain shortness breath has a history of TIA  Patient did have a fall that was seen in ER she's had some right-sided weakness from the bruising Review of Systems Denies any current chest tightness pressure pain shortness breath    Objective:   Physical Exam Lungs are clear hearts regular extremities no edema  Blood pressure 166/86     Assessment & Plan:  Systolic hypertension-pathophysiology explained-amlodipine 2.5 mg tablet 1 daily, follow-up in 3 months if blood pressures are too low to notify us  Follow-up with cancer Center regarding blood counts  Face-to-face evaluation was done regarding this patient. She is becoming weaker. She is having a hard time getting around. The patient would benefit from home physical therapy. The patient would also benefit from a home health consultation with the nurse. I do not know she qualifies for any aids.

## 2016-12-24 ENCOUNTER — Telehealth (HOSPITAL_COMMUNITY): Payer: Self-pay

## 2016-12-24 ENCOUNTER — Encounter (HOSPITAL_COMMUNITY): Payer: Medicare Other | Attending: Oncology

## 2016-12-24 ENCOUNTER — Other Ambulatory Visit (HOSPITAL_COMMUNITY): Payer: Self-pay

## 2016-12-24 DIAGNOSIS — D500 Iron deficiency anemia secondary to blood loss (chronic): Secondary | ICD-10-CM | POA: Insufficient documentation

## 2016-12-24 LAB — CBC WITH DIFFERENTIAL/PLATELET
Basophils Absolute: 0.1 10*3/uL (ref 0.0–0.1)
Basophils Relative: 1 %
EOS ABS: 0.1 10*3/uL (ref 0.0–0.7)
EOS PCT: 2 %
HCT: 42.4 % (ref 36.0–46.0)
HEMOGLOBIN: 14 g/dL (ref 12.0–15.0)
LYMPHS ABS: 1.6 10*3/uL (ref 0.7–4.0)
Lymphocytes Relative: 27 %
MCH: 31.9 pg (ref 26.0–34.0)
MCHC: 33 g/dL (ref 30.0–36.0)
MCV: 96.6 fL (ref 78.0–100.0)
MONOS PCT: 8 %
Monocytes Absolute: 0.5 10*3/uL (ref 0.1–1.0)
NEUTROS PCT: 62 %
Neutro Abs: 3.6 10*3/uL (ref 1.7–7.7)
Platelets: 182 10*3/uL (ref 150–400)
RBC: 4.39 MIL/uL (ref 3.87–5.11)
RDW: 12.7 % (ref 11.5–15.5)
WBC: 5.9 10*3/uL (ref 4.0–10.5)

## 2016-12-24 LAB — IRON AND TIBC
IRON: 71 ug/dL (ref 28–170)
Saturation Ratios: 24 % (ref 10.4–31.8)
TIBC: 300 ug/dL (ref 250–450)
UIBC: 229 ug/dL

## 2016-12-24 LAB — FERRITIN: FERRITIN: 157 ng/mL (ref 11–307)

## 2016-12-24 NOTE — Telephone Encounter (Signed)
Patients son called stating his mom was weak and acting like she was the last time she needed an Iron transusion. She has labs scheduled for 5/8 but son wants to know if she can have them today. Moved appt to today at 1210 and linked labs. Son was thankful and verbalized understanding of new appt time.

## 2016-12-25 ENCOUNTER — Telehealth: Payer: Self-pay | Admitting: Family Medicine

## 2016-12-25 DIAGNOSIS — R270 Ataxia, unspecified: Secondary | ICD-10-CM

## 2016-12-25 DIAGNOSIS — W19XXXD Unspecified fall, subsequent encounter: Secondary | ICD-10-CM

## 2016-12-25 DIAGNOSIS — R531 Weakness: Secondary | ICD-10-CM

## 2016-12-25 NOTE — Telephone Encounter (Signed)
FYI-patient is stumbling a lot more but blood work came out good from specialist.Just wanted you to know if she needs to be seen for further test.

## 2016-12-26 NOTE — Telephone Encounter (Signed)
Spoke with patient and informed him per Dr.Scott Luking- we can help set patient up for physical therapy to help with walking and strengthening.Patient's son stated he would like that but he would also like a referral for patient to have home health come in to help patient with ADL's. May we have referral

## 2016-12-26 NOTE — Telephone Encounter (Signed)
Top with the son please-we can help set her up for physical therapy to be done at the home if he would like to help her with walking and strengthening

## 2016-12-29 NOTE — Telephone Encounter (Signed)
Nurse's-please put in consultation for physical therapy also putting consultation for home health nursing in addition to this I did do an addendum for her last visit in April to help cover for this-diagnosis ataxia weakness

## 2016-12-30 ENCOUNTER — Ambulatory Visit (INDEPENDENT_AMBULATORY_CARE_PROVIDER_SITE_OTHER): Payer: Self-pay | Admitting: Nurse Practitioner

## 2016-12-30 ENCOUNTER — Encounter: Payer: Self-pay | Admitting: Nurse Practitioner

## 2016-12-30 VITALS — BP 127/65 | HR 58 | Temp 98.0°F | Ht <= 58 in | Wt 101.8 lb

## 2016-12-30 DIAGNOSIS — K922 Gastrointestinal hemorrhage, unspecified: Secondary | ICD-10-CM

## 2016-12-30 NOTE — Addendum Note (Signed)
Addended by: Dairl Ponder on: 12/30/2016 09:05 AM   Modules accepted: Orders

## 2016-12-30 NOTE — Progress Notes (Signed)
Patient had to leave appointment due to other obligations. Appointment cancelled.

## 2016-12-30 NOTE — Telephone Encounter (Signed)
Referral ordered in EPIC. 

## 2016-12-31 ENCOUNTER — Other Ambulatory Visit (HOSPITAL_COMMUNITY): Payer: Medicare Other

## 2017-01-06 ENCOUNTER — Telehealth: Payer: Self-pay | Admitting: Family Medicine

## 2017-01-06 NOTE — Telephone Encounter (Signed)
Requesting verbal orders for [employer] and Gait training. 3 times a week for 2 weeks, 2 x a week for 2 weeks and then 1x a week for 1 week.

## 2017-01-06 NOTE — Telephone Encounter (Signed)
May have verbal orders for this 

## 2017-01-07 NOTE — Telephone Encounter (Signed)
Spoke with Kasandra Knudsen at [facility] and gave verbal orders per Dr.Scott Luking for the [employer]. [place] verbalized understanding.

## 2017-01-17 ENCOUNTER — Encounter (HOSPITAL_COMMUNITY): Payer: Self-pay | Admitting: Emergency Medicine

## 2017-01-17 ENCOUNTER — Emergency Department (HOSPITAL_COMMUNITY): Payer: Medicare Other

## 2017-01-17 ENCOUNTER — Other Ambulatory Visit: Payer: Self-pay

## 2017-01-17 ENCOUNTER — Telehealth: Payer: Self-pay | Admitting: *Deleted

## 2017-01-17 ENCOUNTER — Emergency Department (HOSPITAL_COMMUNITY)
Admission: EM | Admit: 2017-01-17 | Discharge: 2017-01-17 | Disposition: A | Discharge status: Home / Self Care | Payer: Medicare Other | Attending: Emergency Medicine | Admitting: Emergency Medicine

## 2017-01-17 DIAGNOSIS — H02402 Unspecified ptosis of left eyelid: Secondary | ICD-10-CM | POA: Insufficient documentation

## 2017-01-17 DIAGNOSIS — I10 Essential (primary) hypertension: Secondary | ICD-10-CM | POA: Diagnosis not present

## 2017-01-17 DIAGNOSIS — Z87891 Personal history of nicotine dependence: Secondary | ICD-10-CM | POA: Diagnosis not present

## 2017-01-17 DIAGNOSIS — J449 Chronic obstructive pulmonary disease, unspecified: Secondary | ICD-10-CM | POA: Diagnosis not present

## 2017-01-17 DIAGNOSIS — R410 Disorientation, unspecified: Secondary | ICD-10-CM | POA: Insufficient documentation

## 2017-01-17 DIAGNOSIS — Z79899 Other long term (current) drug therapy: Secondary | ICD-10-CM | POA: Diagnosis not present

## 2017-01-17 DIAGNOSIS — Z5181 Encounter for therapeutic drug level monitoring: Secondary | ICD-10-CM | POA: Insufficient documentation

## 2017-01-17 DIAGNOSIS — R29810 Facial weakness: Secondary | ICD-10-CM | POA: Insufficient documentation

## 2017-01-17 LAB — COMPREHENSIVE METABOLIC PANEL
ALBUMIN: 3.9 g/dL (ref 3.5–5.0)
ALT: 11 U/L — AB (ref 14–54)
AST: 19 U/L (ref 15–41)
Alkaline Phosphatase: 78 U/L (ref 38–126)
Anion gap: 7 (ref 5–15)
BUN: 17 mg/dL (ref 6–20)
CHLORIDE: 101 mmol/L (ref 101–111)
CO2: 28 mmol/L (ref 22–32)
CREATININE: 0.69 mg/dL (ref 0.44–1.00)
Calcium: 9.6 mg/dL (ref 8.9–10.3)
GFR calc Af Amer: >60 mL/min (ref >60)
GLUCOSE: 106 mg/dL — AB (ref 65–99)
Potassium: 3.3 mmol/L — ABNORMAL LOW (ref 3.5–5.1)
Sodium: 136 mmol/L (ref 135–145)
Total Bilirubin: 0.7 mg/dL (ref 0.3–1.2)
Total Protein: 6.9 g/dL (ref 6.5–8.1)

## 2017-01-17 LAB — URINALYSIS, ROUTINE W REFLEX MICROSCOPIC
Bilirubin Urine: NEGATIVE
GLUCOSE, UA: NEGATIVE mg/dL
HGB URINE DIPSTICK: NEGATIVE
Ketones, ur: NEGATIVE mg/dL
Nitrite: NEGATIVE
PH: 7 (ref 5.0–8.0)
Protein, ur: NEGATIVE mg/dL
SPECIFIC GRAVITY, URINE: 1.004 — AB (ref 1.005–1.030)
SQUAMOUS EPITHELIAL / LPF: NONE SEEN

## 2017-01-17 LAB — DIFFERENTIAL
BASOS ABS: 0.1 10*3/uL (ref 0.0–0.1)
BASOS PCT: 1 %
Eosinophils Absolute: 0.1 10*3/uL (ref 0.0–0.7)
Eosinophils Relative: 2 %
LYMPHS ABS: 1.7 10*3/uL (ref 0.7–4.0)
Lymphocytes Relative: 31 %
MONOS PCT: 12 %
Monocytes Absolute: 0.7 10*3/uL (ref 0.1–1.0)
NEUTROS ABS: 3 10*3/uL (ref 1.7–7.7)
NEUTROS PCT: 54 %

## 2017-01-17 LAB — I-STAT CHEM 8, ED
BUN: 17 mg/dL (ref 6–20)
CHLORIDE: 101 mmol/L (ref 101–111)
CREATININE: 0.8 mg/dL (ref 0.44–1.00)
Calcium, Ion: 1.23 mmol/L (ref 1.15–1.40)
GLUCOSE: 103 mg/dL — AB (ref 65–99)
HCT: 39 % (ref 36.0–46.0)
Hemoglobin: 13.3 g/dL (ref 12.0–15.0)
POTASSIUM: 3.4 mmol/L — AB (ref 3.5–5.1)
Sodium: 139 mmol/L (ref 135–145)
TCO2: 28 mmol/L (ref 0–100)

## 2017-01-17 LAB — I-STAT TROPONIN, ED: TROPONIN I, POC: 0.01 ng/mL (ref 0.00–0.08)

## 2017-01-17 LAB — PROTIME-INR
INR: 1.01
Prothrombin Time: 13.3 seconds (ref 11.4–15.2)

## 2017-01-17 LAB — RAPID URINE DRUG SCREEN, HOSP PERFORMED
AMPHETAMINES: NOT DETECTED
BARBITURATES: NOT DETECTED
BENZODIAZEPINES: NOT DETECTED
COCAINE: NOT DETECTED
Opiates: NOT DETECTED
TETRAHYDROCANNABINOL: NOT DETECTED

## 2017-01-17 LAB — CBC
HCT: 39.4 % (ref 36.0–46.0)
HEMOGLOBIN: 13.1 g/dL (ref 12.0–15.0)
MCH: 31.9 pg (ref 26.0–34.0)
MCHC: 33.2 g/dL (ref 30.0–36.0)
MCV: 95.9 fL (ref 78.0–100.0)
Platelets: 200 10*3/uL (ref 150–400)
RBC: 4.11 MIL/uL (ref 3.87–5.11)
RDW: 12.8 % (ref 11.5–15.5)
WBC: 5.6 10*3/uL (ref 4.0–10.5)

## 2017-01-17 LAB — APTT: APTT: 34 s (ref 24–36)

## 2017-01-17 LAB — CBG MONITORING, ED: Glucose-Capillary: 108 mg/dL — ABNORMAL HIGH (ref 65–99)

## 2017-01-17 LAB — ETHANOL: Alcohol, Ethyl (B): <5 mg/dL (ref <5)

## 2017-01-17 NOTE — ED Notes (Signed)
Pt was able to swallow water without incident. Pt took three sips with about 4secs in between.

## 2017-01-17 NOTE — ED Notes (Addendum)
Son speaking with Neuro speaks of R sided weakness, of recent falls

## 2017-01-17 NOTE — Progress Notes (Signed)
CODE STROKE Call time  [id number] Beeper time 1630 Exam started 1632 Exam finished 1634,images sent to [facility] Exam completed in Epic , and [facility] called spoke with Linus Orn, 16:38

## 2017-01-17 NOTE — ED Notes (Signed)
Pt returned from CT- incontinent of urine, Pt to bedpain cleansed and bed changed- Currently seeking paper scrubs to send home in

## 2017-01-17 NOTE — ED Triage Notes (Addendum)
Patient's family member states patient became confused upon awakening at 53 today. States the left side of her face is "swollen." States she was last seen normal around 1200 today prior to falling asleep for 20 minutes. Dr Thurnell Garbe at bedside.

## 2017-01-17 NOTE — ED Notes (Signed)
neurosessment-

## 2017-01-17 NOTE — ED Notes (Signed)
Out of bed ambulating with walker- pt stumbled sever times and family staTES SHE IS NOT AMBULATING AS USUAL

## 2017-01-17 NOTE — Discharge Instructions (Signed)
Take your usual prescriptions as previously directed.  Call your regular medical doctor on Tuesday to schedule a follow up appointment within the next 3 days.  Return to the Emergency Department immediately sooner if worsening.

## 2017-01-17 NOTE — ED Notes (Signed)
Continues in Radiology 

## 2017-01-17 NOTE — Telephone Encounter (Signed)
Home health nurse nicole called to report she was out seeing pt and pt reported she had a fall this morning. Did not hit her head and no apparent injury. Pt keeps saying today is not a good day. Seems more confused and complaining of a headache. Left eye seems to be more lazy than the right eye. bp 162/80. Called son [name] and advised son to take her to ED. Son agreed to take her

## 2017-01-17 NOTE — ED Notes (Signed)
Neuro assessment Dr Nicole Kindred

## 2017-01-17 NOTE — ED Notes (Signed)
From CT 

## 2017-01-17 NOTE — ED Notes (Signed)
Pt ambulated across nurses station, "stumbled" 2 times and complains of her right side being weak. Family states her walking isn't her normal.

## 2017-01-17 NOTE — ED Notes (Addendum)
After speaking with family, Dr Nicole Kindred, neuro, suggest and recommends an MRI

## 2017-01-17 NOTE — ED Notes (Signed)
During swallow screen, pt coughed after sipping water- She stated, "I do that sometimes" requested another sip and coughed again.  Pt states she eats then sips pos after eating to "wash it down". Pt assisted to a higher HOB She is conversant and clear voiced- however, due to cough and inability to swallow without difficulty, she is failed swallow test

## 2017-01-17 NOTE — ED Notes (Signed)
Code stroke cancelled by Dr Nicole Kindred- Neuro

## 2017-01-17 NOTE — ED Notes (Signed)
To CT

## 2017-01-17 NOTE — ED Provider Notes (Signed)
[facility] DEPT Provider Note   CSN: [id number] Arrival date & time: 01/17/17  1616   An emergency department physician performed an initial assessment on this suspected stroke patient at 1630.  History   Chief Complaint Chief Complaint  Patient presents with  . Code Stroke    HPI Carol Massey is a 83 y.o. female.  The history is provided by the patient, a caregiver and a relative. The history is limited by the condition of the patient (confusion).    Pt was seen at 1620. Per pt's family: Pt LKW approximately 1200 today. Pt laid down and took a nap, woke up approximately 1230 "confused" per family. 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  Pt herself denies any complaints.   Past Medical History:  Diagnosis Date  . Anemia   . COPD (chronic obstructive pulmonary disease) ([place])   . Dieulafoy lesion of colon 2006  . Gastric AVM 2014  . Generalized anxiety disorder 07/24/2015  . Hard of hearing   . Hypertension   . Iron deficiency anemia due to chronic blood loss 05/27/2014  . Major depression in remission ([place]) 07/24/2015  . Osteoporosis   . PUD (peptic ulcer disease) 11/27/2015   Gastric and duodenal ulcer on EGD  . Stroke ([place])   . Unsteady gait   . Upper GI bleed 11/27/2015    Patient Active Problem List   Diagnosis Date Noted  . Essential hypertension 12/12/2016  . History of peptic ulcer disease   . Ataxia 12/01/2015  . PUD (peptic ulcer disease)   . Melena 11/27/2015  . Upper GI bleed 11/27/2015  . Tobacco abuse 11/27/2015  . Azotemia 11/27/2015  . Right-sided chest pain 11/27/2015  . Dysphagia   . Generalized anxiety disorder 07/24/2015  . Major depression in remission ([place]) 07/24/2015  . Iron deficiency anemia due to chronic blood loss 05/27/2014  . Hyperlipemia 03/18/2013  . COPD (chronic obstructive pulmonary disease) ([place]) 01/12/2013    . Osteoporosis 01/12/2013    Past Surgical History:  Procedure Laterality Date  . ABDOMINAL HYSTERECTOMY    . APPENDECTOMY    . COLONOSCOPY  03/2005   Dr. Gala Romney: rectal and rectosigmoid polyps removed  . COLONOSCOPY WITH ESOPHAGOGASTRODUODENOSCOPY (EGD) N/A 08/12/2013   Dr. Gala Romney: colonoscopy with tubular adenoma, colonic diverticulosis. EGD with non-critical Schatzki's ring, non-manipulated, and gastric AVM with evidence of recent bleeding, s/p thermal sealing and hemostasis clip  . ESOPHAGEAL DILATION  11/27/2015   Procedure: ESOPHAGEAL DILATION;  Surgeon: Daneil Dolin, MD;  Location: [facility];  Service: Endoscopy;;  . ESOPHAGOGASTRODUODENOSCOPY  03/2005   Dr. Gala Romney: tiny distal esophageal erosions  . ESOPHAGOGASTRODUODENOSCOPY N/A 11/27/2015   RMR: Cricopharyngeus web, status post dilation. Nonbleeding duodenal and gastric ulcers without stigmata of bleeding. Gastric biopsies benign  . ESOPHAGOGASTRODUODENOSCOPY N/A 02/14/2016   Procedure: ESOPHAGOGASTRODUODENOSCOPY (EGD);  Surgeon: Daneil Dolin, MD;  Location: [facility];  Service: Endoscopy;  Laterality: N/A;  300  . GIVENS CAPSULE STUDY N/A 11/27/2015   NOT DONE, WAS NOT INDICATED AT THE TIME  . MALONEY DILATION N/A 02/14/2016   Procedure: Venia Minks DILATION;  Surgeon: Daneil Dolin, MD;  Location: [facility];  Service: Endoscopy;  Laterality: N/A;  . small bowel capsule study  03/2005   Dr. Laural Golden: two small Dieulafoy lesions at ascending colon. tiney specks of blood at duodenal bulb but no identified  lesions  . TONSILLECTOMY      OB History    No data available       Home Medications    Prior to Admission medications   Medication Sig Start Date End Date Taking? Authorizing Provider  acetaminophen (TYLENOL) 325 MG tablet Take 650 mg by mouth daily.    Yes [provider]  ALPRAZolam (XANAX) 0.25 MG tablet TAKE 1 TABLET BY MOUTH TWICE A DAY AS NEEDED Patient taking differently: Takes one tablet every  morning. *TAKE 1 TABLET BY MOUTH TWICE A DAY AS NEEDED. 09/25/16  Yes Luking, Scott A, MD  amLODipine (NORVASC) 2.5 MG tablet Take 1 tablet (2.5 mg total) by mouth daily. 12/12/16  Yes Luking, Elayne Snare, MD  ASCORBIC ACID PO Take 1 capsule by mouth every morning.    Yes [provider]  Calcium Carb-Cholecalciferol (CALCIUM + D3 PO) Take 1 capsule by mouth every morning.    Yes [provider]  citalopram (CELEXA) 10 MG tablet TAKE 1 TABLET BY MOUTH EVERY DAY 09/25/16  Yes Luking, Scott A, MD  pantoprazole (PROTONIX) 40 MG tablet Take 1 tablet (40 mg total) by mouth daily. For treatment of the ulcer. 09/16/16  Yes Carlis Stable, NP  PROVENTIL HFA 108 (90 BASE) MCG/ACT inhaler INHALE 2 PUFFS INTO THE LUNGS EVERY 6 (SIX) HOURS AS NEEDED FOR WHEEZING. 04/24/15  Yes Mikey Kirschner, MD  potassium chloride SA (K-DUR,KLOR-CON) 20 MEQ tablet Take 1 tablet (20 mEq total) by mouth daily. Patient not taking: Reported on 12/30/2016 12/03/16   Baird Cancer, PA-C    Family History Family History  Problem Relation Age of Onset  . Hypertension Mother   . Stroke Mother   . Leukemia Father 41  . Colon cancer Neg Hx     Social History Social History  Substance Use Topics  . Smoking status: Former Smoker    Packs/day: 0.50    Years: 70.00    Types: Cigarettes  . Smokeless tobacco: Never Used     Comment: about 1/2 ppd  . Alcohol use No     Allergies   Patient has no known allergies.   Review of Systems Review of Systems  Unable to perform ROS: Mental status change     Physical Exam Updated Vital Signs BP (!) 178/60   Pulse (!) 55   Temp 98.4 F (36.9 C)   Resp (!) 27   Ht 4\' 10"  (1.473 m)   Wt 45.8 kg (101 lb)   SpO2 94%   BMI 21.11 kg/m   Physical Exam 1625; Physical examination:  Nursing notes reviewed; Vital signs and O2 SAT reviewed;  Constitutional: Well developed, Well nourished, Well hydrated, In no acute distress; Head:  Normocephalic, atraumatic; Eyes:  EOMI, PERRL, No scleral icterus; ENMT: Mouth and pharynx normal, Mucous membranes moist; Neck: Supple, Full range of motion, No lymphadenopathy; Cardiovascular: Regular rate and rhythm, No gallop; Respiratory: Breath sounds clear & equal bilaterally, No wheezes.  Speaking full sentences with ease, Normal respiratory effort/excursion; Chest: Nontender, Movement normal; Abdomen: Soft, Nontender, Nondistended, Normal bowel sounds; Genitourinary: No CVA tenderness; Extremities: Pulses normal, No tenderness, No edema, No calf edema or asymmetry.; Neuro: Awake, alert, confused re: events. +right facial droop, +left eyelid ptosis. Speech clear. Right grip weaker than left. 4/5 strength RUE and RLE with mild decreased sensation, per hx. 5/5 strength LUE and LLE, no gross sensory deficits.; Skin: Color normal, Warm, Dry.   ED Treatments / Results  Labs (all labs ordered  are listed, but only abnormal results are displayed)   EKG  EKG Interpretation None       Radiology   Procedures Procedures (including critical care time)  Medications Ordered in ED Medications - No data to display   Initial Impression / Assessment and Plan / ED Course  I have reviewed the triage vital signs and the nursing notes.  Pertinent labs & imaging results that were available during my care of the patient were reviewed by me and considered in my medical decision making (see chart for details).  MDM Reviewed: previous chart, nursing note and vitals Reviewed previous: labs and ECG Interpretation: labs, ECG, CT scan and MRI    ED ECG REPORT   Date: 01/17/2017  Rate: 64  Rhythm: normal sinus rhythm  QRS Axis: normal  Intervals: normal  ST/T Wave abnormalities: normal  Conduction Disutrbances:none  Narrative Interpretation:   Old EKG Reviewed: unchanged from previous EKG dated 01/26/2016.    Results for orders placed or performed during the hospital encounter of 01/17/17  Ethanol  Result Value Ref Range    Alcohol, Ethyl (B) <5 <5 mg/dL  Protime-INR  Result Value Ref Range   Prothrombin Time 13.3 11.4 - 15.2 seconds   INR 1.01   APTT  Result Value Ref Range   aPTT 34 24 - 36 seconds  CBC  Result Value Ref Range   WBC 5.6 4.0 - 10.5 K/uL   RBC 4.11 3.87 - 5.11 MIL/uL   Hemoglobin 13.1 12.0 - 15.0 g/dL   HCT 39.4 36.0 - 46.0 %   MCV 95.9 78.0 - 100.0 fL   MCH 31.9 26.0 - 34.0 pg   MCHC 33.2 30.0 - 36.0 g/dL   RDW 12.8 11.5 - 15.5 %   Platelets 200 150 - 400 K/uL  Differential  Result Value Ref Range   Neutrophils Relative % 54 %   Neutro Abs 3.0 1.7 - 7.7 K/uL   Lymphocytes Relative 31 %   Lymphs Abs 1.7 0.7 - 4.0 K/uL   Monocytes Relative 12 %   Monocytes Absolute 0.7 0.1 - 1.0 K/uL   Eosinophils Relative 2 %   Eosinophils Absolute 0.1 0.0 - 0.7 K/uL   Basophils Relative 1 %   Basophils Absolute 0.1 0.0 - 0.1 K/uL  Comprehensive metabolic panel  Result Value Ref Range   Sodium 136 135 - 145 mmol/L   Potassium 3.3 (L) 3.5 - 5.1 mmol/L   Chloride 101 101 - 111 mmol/L   CO2 28 22 - 32 mmol/L   Glucose, Bld 106 (H) 65 - 99 mg/dL   BUN 17 6 - 20 mg/dL   Creatinine, Ser 0.69 0.44 - 1.00 mg/dL   Calcium 9.6 8.9 - 10.3 mg/dL   Total Protein 6.9 6.5 - 8.1 g/dL   Albumin 3.9 3.5 - 5.0 g/dL   AST 19 15 - 41 U/L   ALT 11 (L) 14 - 54 U/L   Alkaline Phosphatase 78 38 - 126 U/L   Total Bilirubin 0.7 0.3 - 1.2 mg/dL   GFR calc non Af Amer >60 >60 mL/min   GFR calc Af Amer >60 >60 mL/min   Anion gap 7 5 - 15  Urine rapid drug screen (hosp performed)not at [facility]  Result Value Ref Range   Opiates NONE DETECTED NONE DETECTED   Cocaine NONE DETECTED NONE DETECTED   Benzodiazepines NONE DETECTED NONE DETECTED   Amphetamines NONE DETECTED NONE DETECTED   Tetrahydrocannabinol NONE DETECTED NONE DETECTED   Barbiturates NONE  DETECTED NONE DETECTED  Urinalysis, Routine w reflex microscopic  Result Value Ref Range   Color, Urine COLORLESS (A) YELLOW   APPearance CLEAR CLEAR   Specific  Gravity, Urine 1.004 (L) 1.005 - 1.030   pH 7.0 5.0 - 8.0   Glucose, UA NEGATIVE NEGATIVE mg/dL   Hgb urine dipstick NEGATIVE NEGATIVE   Bilirubin Urine NEGATIVE NEGATIVE   Ketones, ur NEGATIVE NEGATIVE mg/dL   Protein, ur NEGATIVE NEGATIVE mg/dL   Nitrite NEGATIVE NEGATIVE   Leukocytes, UA SMALL (A) NEGATIVE   RBC / HPF 0-5 0 - 5 RBC/hpf   WBC, UA 6-30 0 - 5 WBC/hpf   Bacteria, UA RARE (A) NONE SEEN   Squamous Epithelial / LPF NONE SEEN NONE SEEN  I-Stat Chem 8, ED  (not at [facility])  Result Value Ref Range   Sodium 139 135 - 145 mmol/L   Potassium 3.4 (L) 3.5 - 5.1 mmol/L   Chloride 101 101 - 111 mmol/L   BUN 17 6 - 20 mg/dL   Creatinine, Ser 0.80 0.44 - 1.00 mg/dL   Glucose, Bld 103 (H) 65 - 99 mg/dL   Calcium, Ion 1.23 1.15 - 1.40 mmol/L   TCO2 28 0 - 100 mmol/L   Hemoglobin 13.3 12.0 - 15.0 g/dL   HCT 39.0 36.0 - 46.0 %  I-stat troponin, ED (not at [facility])  Result Value Ref Range   Troponin i, poc 0.01 0.00 - 0.08 ng/mL   Comment 3          CBG monitoring, ED  Result Value Ref Range   Glucose-Capillary 108 (H) 65 - 99 mg/dL    Dg Chest 2 View Result Date: 01/17/2017 CLINICAL DATA:  Confusion EXAM: CHEST  2 VIEW COMPARISON:  01/26/2016 FINDINGS: Scoliosis of the spine. No acute consolidation or effusion. Minimal basilar scarring. Cardiomegaly with atherosclerosis. A faint oval opacity overlies the right lung apex and could be related to monitoring lead. Spine is osteopenic. IMPRESSION: 1. No acute infiltrate or edema 2. Mild cardiomegaly 3. Oval opacity over the right upper lung, could relate to a monitoring lead, clinical correlation recommended. Electronically Signed   By: Donavan Foil M.D.   On: 01/17/2017 19:28   Mr Brain Wo Contrast (neuro Protocol) Result Date: 01/17/2017 CLINICAL DATA:  82 y/o  F; frequent falls and confusion. EXAM: MRI HEAD WITHOUT CONTRAST TECHNIQUE: Multiplanar, multiecho pulse sequences of the brain and surrounding structures were obtained  without intravenous contrast. COMPARISON:  01/17/2017 CT of the head. FINDINGS: Brain: Motion degradation on multiple pulse sequences. No acute infarction, hemorrhage, hydrocephalus, extra-axial collection or mass lesion. Nonspecific foci of T2 FLAIR hyperintense signal abnormality in subcortical and periventricular white matter are compatible with moderate chronic microvascular ischemic changes. Moderate brain parenchymal volume loss, parietal predominance. Vascular: Normal flow voids. Skull and upper cervical spine: Normal marrow signal. Sinuses/Orbits: Negative. Other: None. IMPRESSION: 1. No acute intracranial abnormality. 2.  Moderate chronic microvascular ischemic changes. 3. Moderate parenchymal volume loss of the brain with parietal predominance. Electronically Signed   By: Kristine Garbe M.D.   On: 01/17/2017 18:47   Ct Head Code Stroke W/o Cm Result Date: 01/17/2017 CLINICAL DATA:  Code stroke. 83 y/o F; right facial droop and confusion. EXAM: CT HEAD WITHOUT CONTRAST TECHNIQUE: Contiguous axial images were obtained from the base of the skull through the vertex without intravenous contrast. COMPARISON:  01/26/2016 CT head. FINDINGS: Brain: No evidence of acute infarction, hemorrhage, hydrocephalus, extra-axial collection or mass lesion/mass effect. Stable foci of  hypoattenuation in subcortical and periventricular white matter compatible with mild chronic microvascular ischemic changes for age. Mild brain parenchymal volume loss. Stable small foci of hypoattenuation in basal ganglia which may represent prominent perivascular spaces or chronic lacunar infarctions. Vascular: Extensive calcific atherosclerosis of carotid siphons. No hyperdense vessel identified. Skull: Normal. Negative for fracture or focal lesion. Sinuses/Orbits: No acute finding. Other: None. ASPECTS [facility] Stroke Program Early CT Score) - Ganglionic level infarction (caudate, lentiform nuclei, internal capsule, insula, M1-M3  cortex): 7 - Supraganglionic infarction (M4-M6 cortex): 3 Total score (0-10 with 10 being normal): 10 IMPRESSION: 1. No acute intracranial abnormality identified. 2. ASPECTS is 10 3. Mild for age chronic microvascular ischemic changes and mild parenchymal volume loss of the brain. These results were called by telephone at the time of interpretation on 01/17/2017 at 4:45 pm to Dr. Lacinda Axon, who verbally acknowledged these results. Electronically Signed   By: Kristine Garbe M.D.   On: 01/17/2017 16:46    1710:  Code stroke called on pt's arrival. [facility] Neuro MD has evaluated pt: requests to obtain MRI brain, likely will be negative and pt can be d/c (does not need admit for stroke/TIA workup); requests to also ambulate pt, as she has been "falling a lot," if cannot walk safely she will need admit and PT consult, otherwise can be d/c home with outpt f/u.    1950:  Initial swallow screen: pt coughed after sips. Upon my re-evaluation, I discussed this:  pt's family states they "have been feeding her just fine" since that initial swallow screen, and she has not choked/gagged/coughed. Will re-evaluate swallow screen. Pt ambulated with walker, stumbled a few times.  I discussed this with family: Family states they stood by watching as pt "jumped out of bed" and walked; family states pt's gait was her baseline "just a little slower than usual."  Pt's workup reassuring. No clear UTI on Udip; UC pending. Admission vs d/c discussed with pt and her family. Pt and family would like pt to be discharged home.   2000:  Pt has tol PO well without choking/gagging/coughing. Pt's family would like to take her home now and pt wants to leave. Dx and testing d/w pt and family.  Questions answered.  Verb understanding, agreeable to d/c home with outpt f/u.    Final Clinical Impressions(s) / ED Diagnoses   Final diagnoses:  None    New Prescriptions New Prescriptions   No medications on file      Francine Graven,  DO 01/22/17 1510

## 2017-01-20 LAB — URINE CULTURE: Culture: 70000 — AB

## 2017-01-21 NOTE — Telephone Encounter (Signed)
Post ED Visit - Positive Culture Follow-up  Culture report reviewed by antimicrobial stewardship pharmacist:  []  Elenor Quinones, Pharm.D. [x]  Heide Guile, Pharm.D., BCPS AQ-ID []  Parks Neptune, Pharm.D., BCPS []  Alycia Rossetti, Pharm.D., BCPS []  [place], Pharm.D., BCPS, AAHIVP []  [name], Pharm.D., BCPS, AAHIVP []  Salome Arnt, PharmD, BCPS []  Dimitri Ped, PharmD, BCPS []  Vincenza Hews, PharmD, BCPS  Positive urine culture Treated with none, asymptomatic, no further patient follow-up is required at this time.  Hazle Nordmann 01/21/2017, 10:20 AM

## 2017-01-22 ENCOUNTER — Ambulatory Visit (INDEPENDENT_AMBULATORY_CARE_PROVIDER_SITE_OTHER): Payer: Medicare Other | Admitting: Family Medicine

## 2017-01-22 ENCOUNTER — Encounter: Payer: Self-pay | Admitting: Family Medicine

## 2017-01-22 VITALS — BP 132/86 | Ht <= 58 in | Wt 101.0 lb

## 2017-01-22 DIAGNOSIS — N3000 Acute cystitis without hematuria: Secondary | ICD-10-CM | POA: Diagnosis not present

## 2017-01-22 DIAGNOSIS — E876 Hypokalemia: Secondary | ICD-10-CM

## 2017-01-22 MED ORDER — CEFDINIR 300 MG PO CAPS: 300.0000 mg | ORAL_CAPSULE | Freq: Two times a day (BID) | ORAL | 0 refills | Status: DC

## 2017-01-22 MED ORDER — POTASSIUM CHLORIDE ER 10 MEQ PO TBCR: 10.0000 meq | EXTENDED_RELEASE_TABLET | Freq: Every day | ORAL | 4 refills | Status: DC

## 2017-01-22 MED ORDER — ALBUTEROL SULFATE HFA 108 (90 BASE) MCG/ACT IN AERS: 2.0000 | INHALATION_SPRAY | RESPIRATORY_TRACT | 4 refills | Status: DC | PRN

## 2017-01-22 MED ORDER — PANTOPRAZOLE SODIUM 40 MG PO TBEC: 40.0000 mg | DELAYED_RELEASE_TABLET | Freq: Every day | ORAL | 5 refills | Status: DC

## 2017-01-22 NOTE — Progress Notes (Signed)
   Subjective:    Patient ID: Carol Massey, female    DOB: 1926-07-03, 83 y.o.   MRN: [id number]  HPIFollow up ED visit. Having confusion, weakness on right side.  Patient was in the ER was some weakness also some confusion she was tested with MRI lab work ultrasounds in addition this had urine culture done she now denies any high fever chills sweats vomiting diarrhea   Review of Systems  Constitutional: Negative for activity change, fatigue and fever.  Respiratory: Negative for cough and shortness of breath.   Cardiovascular: Negative for chest pain and leg swelling.  Neurological: Negative for headaches.       Objective:   Physical Exam  Constitutional: She appears well-nourished. No distress.  Cardiovascular: Normal rate, regular rhythm and normal heart sounds.   No murmur heard. Pulmonary/Chest: Effort normal and breath sounds normal. No respiratory distress.  Musculoskeletal: She exhibits no edema.  Lymphadenopathy:    She has no cervical adenopathy.  Neurological: She is alert. She exhibits normal muscle tone.  Psychiatric: Her behavior is normal.  Vitals reviewed.  Mild right-sided weakness chronic       Assessment & Plan:  No stroke Mild weakness from previous stroke Patient still benefits from physical therapy  Recent ER visit low potassium check metabolic 7 and magnesium Recent ER visit UTI treated with antibiotics this may have caused some of her confusion

## 2017-01-23 ENCOUNTER — Telehealth: Payer: Self-pay | Admitting: Family Medicine

## 2017-01-23 DIAGNOSIS — R531 Weakness: Secondary | ICD-10-CM

## 2017-01-23 DIAGNOSIS — R270 Ataxia, unspecified: Secondary | ICD-10-CM

## 2017-01-23 NOTE — Telephone Encounter (Signed)
Delilah Shan, Home health nurse with [facility] was asked to call and give an update on how patient is doing.  Her BP is 164/80, she is having difficulty swallowing, less coordination and this is lasting longer.  Her symptoms are not as bad, but are similar to what was going on last Friday.

## 2017-01-24 NOTE — Telephone Encounter (Signed)
I would recommend home health to draw her lab work or the family to bring her to the lab to do the follow-up met 7 and magnesium somewhere next week. If ongoing symptoms may need to be seen sooner. Also recommend consultation with neurology because of ongoing symptomatology-of weakness difficulty swallowing less coordination

## 2017-01-24 NOTE — Telephone Encounter (Signed)
Spoke with Delilah Shan at home health. Home health will draw the requested labs on the patient next week. Referral ordered in EPIC.

## 2017-01-28 ENCOUNTER — Encounter (HOSPITAL_COMMUNITY): Payer: Medicare Other | Attending: Oncology

## 2017-01-28 ENCOUNTER — Other Ambulatory Visit (HOSPITAL_COMMUNITY)
Admission: RE | Admit: 2017-01-28 | Discharge: 2017-01-28 | Disposition: A | Discharge status: Home / Self Care | Payer: Medicare Other | Source: Ambulatory Visit | Attending: Family Medicine | Admitting: Family Medicine

## 2017-01-28 DIAGNOSIS — E876 Hypokalemia: Secondary | ICD-10-CM | POA: Diagnosis present

## 2017-01-28 DIAGNOSIS — D500 Iron deficiency anemia secondary to blood loss (chronic): Secondary | ICD-10-CM | POA: Diagnosis present

## 2017-01-28 LAB — BASIC METABOLIC PANEL
Anion gap: 9 (ref 5–15)
BUN: 15 mg/dL (ref 6–20)
CHLORIDE: 102 mmol/L (ref 101–111)
CO2: 27 mmol/L (ref 22–32)
Calcium: 10.2 mg/dL (ref 8.9–10.3)
Creatinine, Ser: 0.83 mg/dL (ref 0.44–1.00)
GFR calc Af Amer: >60 mL/min (ref >60)
GFR calc non Af Amer: >60 mL/min (ref >60)
Glucose, Bld: 121 mg/dL — ABNORMAL HIGH (ref 65–99)
POTASSIUM: 4 mmol/L (ref 3.5–5.1)
SODIUM: 138 mmol/L (ref 135–145)

## 2017-01-28 LAB — IRON AND TIBC
Iron: 68 ug/dL (ref 28–170)
Saturation Ratios: 21 % (ref 10.4–31.8)
TIBC: 323 ug/dL (ref 250–450)
UIBC: 255 ug/dL

## 2017-01-28 LAB — CBC
HCT: 41.6 % (ref 36.0–46.0)
Hemoglobin: 13.7 g/dL (ref 12.0–15.0)
MCH: 31.7 pg (ref 26.0–34.0)
MCHC: 32.9 g/dL (ref 30.0–36.0)
MCV: 96.3 fL (ref 78.0–100.0)
PLATELETS: 217 10*3/uL (ref 150–400)
RBC: 4.32 MIL/uL (ref 3.87–5.11)
RDW: 13 % (ref 11.5–15.5)
WBC: 6.2 10*3/uL (ref 4.0–10.5)

## 2017-01-28 LAB — FERRITIN: Ferritin: 115 ng/mL (ref 11–307)

## 2017-01-28 LAB — MAGNESIUM: MAGNESIUM: 2.2 mg/dL (ref 1.7–2.4)

## 2017-01-29 ENCOUNTER — Ambulatory Visit: Payer: Medicare Other | Admitting: Family Medicine

## 2017-01-30 DIAGNOSIS — R270 Ataxia, unspecified: Secondary | ICD-10-CM | POA: Diagnosis not present

## 2017-01-30 DIAGNOSIS — I10 Essential (primary) hypertension: Secondary | ICD-10-CM | POA: Diagnosis not present

## 2017-02-10 ENCOUNTER — Telehealth: Payer: Self-pay | Admitting: Family Medicine

## 2017-02-10 NOTE — Telephone Encounter (Signed)
Calling to let Dr. Nicki Reaper know that she is discharging Carol Massey today from home health PT because she has met her max potential.

## 2017-02-25 ENCOUNTER — Encounter (HOSPITAL_COMMUNITY): Payer: Medicare Other | Attending: Oncology

## 2017-02-25 DIAGNOSIS — Z87891 Personal history of nicotine dependence: Secondary | ICD-10-CM | POA: Diagnosis not present

## 2017-02-25 DIAGNOSIS — I10 Essential (primary) hypertension: Secondary | ICD-10-CM | POA: Insufficient documentation

## 2017-02-25 DIAGNOSIS — D509 Iron deficiency anemia, unspecified: Secondary | ICD-10-CM | POA: Insufficient documentation

## 2017-02-25 DIAGNOSIS — Z8249 Family history of ischemic heart disease and other diseases of the circulatory system: Secondary | ICD-10-CM | POA: Insufficient documentation

## 2017-02-25 DIAGNOSIS — K228 Other specified diseases of esophagus: Secondary | ICD-10-CM | POA: Diagnosis not present

## 2017-02-25 DIAGNOSIS — R531 Weakness: Secondary | ICD-10-CM | POA: Insufficient documentation

## 2017-02-25 DIAGNOSIS — Z9889 Other specified postprocedural states: Secondary | ICD-10-CM | POA: Insufficient documentation

## 2017-02-25 DIAGNOSIS — D500 Iron deficiency anemia secondary to blood loss (chronic): Secondary | ICD-10-CM

## 2017-02-25 DIAGNOSIS — R1310 Dysphagia, unspecified: Secondary | ICD-10-CM | POA: Insufficient documentation

## 2017-02-25 DIAGNOSIS — Z806 Family history of leukemia: Secondary | ICD-10-CM | POA: Insufficient documentation

## 2017-02-25 DIAGNOSIS — E876 Hypokalemia: Secondary | ICD-10-CM | POA: Diagnosis not present

## 2017-02-25 DIAGNOSIS — Z8673 Personal history of transient ischemic attack (TIA), and cerebral infarction without residual deficits: Secondary | ICD-10-CM | POA: Diagnosis not present

## 2017-02-25 DIAGNOSIS — Z79899 Other long term (current) drug therapy: Secondary | ICD-10-CM | POA: Insufficient documentation

## 2017-02-25 DIAGNOSIS — Z823 Family history of stroke: Secondary | ICD-10-CM | POA: Insufficient documentation

## 2017-02-25 DIAGNOSIS — J449 Chronic obstructive pulmonary disease, unspecified: Secondary | ICD-10-CM | POA: Diagnosis not present

## 2017-02-25 DIAGNOSIS — Z90710 Acquired absence of both cervix and uterus: Secondary | ICD-10-CM | POA: Diagnosis not present

## 2017-02-25 LAB — FERRITIN: FERRITIN: 114 ng/mL (ref 11–307)

## 2017-02-25 LAB — CBC WITH DIFFERENTIAL/PLATELET
BASOS ABS: 0.1 10*3/uL (ref 0.0–0.1)
Basophils Relative: 1 %
EOS PCT: 3 %
Eosinophils Absolute: 0.1 10*3/uL (ref 0.0–0.7)
HCT: 39.5 % (ref 36.0–46.0)
HEMOGLOBIN: 12.9 g/dL (ref 12.0–15.0)
LYMPHS ABS: 1.3 10*3/uL (ref 0.7–4.0)
LYMPHS PCT: 26 %
MCH: 31.5 pg (ref 26.0–34.0)
MCHC: 32.7 g/dL (ref 30.0–36.0)
MCV: 96.3 fL (ref 78.0–100.0)
Monocytes Absolute: 0.4 10*3/uL (ref 0.1–1.0)
Monocytes Relative: 8 %
Neutro Abs: 3.2 10*3/uL (ref 1.7–7.7)
Neutrophils Relative %: 62 %
PLATELETS: 207 10*3/uL (ref 150–400)
RBC: 4.1 MIL/uL (ref 3.87–5.11)
RDW: 13.1 % (ref 11.5–15.5)
WBC: 5.1 10*3/uL (ref 4.0–10.5)

## 2017-02-25 LAB — IRON AND TIBC
Iron: 53 ug/dL (ref 28–170)
SATURATION RATIOS: 18 % (ref 10.4–31.8)
TIBC: 300 ug/dL (ref 250–450)
UIBC: 247 ug/dL

## 2017-03-07 ENCOUNTER — Encounter: Payer: Self-pay | Admitting: Neurology

## 2017-03-07 ENCOUNTER — Ambulatory Visit (INDEPENDENT_AMBULATORY_CARE_PROVIDER_SITE_OTHER): Payer: Medicare Other | Admitting: Neurology

## 2017-03-07 VITALS — BP 149/69 | HR 58 | Ht 59.0 in | Wt 104.0 lb

## 2017-03-07 DIAGNOSIS — R269 Unspecified abnormalities of gait and mobility: Secondary | ICD-10-CM | POA: Diagnosis not present

## 2017-03-07 DIAGNOSIS — R29818 Other symptoms and signs involving the nervous system: Secondary | ICD-10-CM | POA: Diagnosis not present

## 2017-03-07 DIAGNOSIS — R200 Anesthesia of skin: Secondary | ICD-10-CM | POA: Insufficient documentation

## 2017-03-07 NOTE — Progress Notes (Signed)
Reason for visit: Right sided weakness and numbness  Referring physician: Dr. Carolan Shiver Carol Massey is a 83 y.o. female  History of present illness:  Carol Massey is an 83 year old right-handed white female with a history of right-sided weakness and numbness and visual disturbance that dates back about 5 years. On 01/17/2017, the patient has had worsening of these deficits. The patient went to the emergency room and underwent MRI of the brain that was unremarkable. The patient does have chronic moderate level small vessel disease, no acute changes were seen. The patient has had persistent increased weakness and vision changes off of the right. The patient reports that she cannot see on the right side with the right eye, she has normal vision with the left eye, and she has right-sided numbness and weakness involving the face, arm, and leg. The patient has significant gait instability, she has been using a walker for 18 months. The patient reports dizziness and vertigo. She has had some minimal change in speech. She has not had any significant changes in bowel or bladder control. She is not on an antiplatelet agent as she has had problems with a bleeding ulcer. She comes to this office for an evaluation. The patient also reports onset of decreased hearing in the right ear since the 25th of May 2018.  Past Medical History:  Diagnosis Date  . Anemia   . COPD (chronic obstructive pulmonary disease) ([place])   . Dieulafoy lesion of colon 2006  . Gastric AVM 2014  . Generalized anxiety disorder 07/24/2015  . Hard of hearing   . Hypertension   . Iron deficiency anemia due to chronic blood loss 05/27/2014  . Major depression in remission ([place]) 07/24/2015  . Osteoporosis   . PUD (peptic ulcer disease) 11/27/2015   Gastric and duodenal ulcer on EGD  . Stroke ([place])   . Unsteady gait   . Upper GI bleed 11/27/2015    Past Surgical History:  Procedure Laterality Date  . ABDOMINAL HYSTERECTOMY    .  APPENDECTOMY    . COLONOSCOPY  03/2005   Dr. Gala Romney: rectal and rectosigmoid polyps removed  . COLONOSCOPY WITH ESOPHAGOGASTRODUODENOSCOPY (EGD) N/A 08/12/2013   Dr. Gala Romney: colonoscopy with tubular adenoma, colonic diverticulosis. EGD with non-critical Schatzki's ring, non-manipulated, and gastric AVM with evidence of recent bleeding, s/p thermal sealing and hemostasis clip  . ESOPHAGEAL DILATION  11/27/2015   Procedure: ESOPHAGEAL DILATION;  Surgeon: Daneil Dolin, MD;  [facility];  Service: Endoscopy;;  . ESOPHAGOGASTRODUODENOSCOPY  03/2005   Dr. Gala Romney: tiny distal esophageal erosions  . ESOPHAGOGASTRODUODENOSCOPY N/A 11/27/2015   RMR: Cricopharyngeus web, status post dilation. Nonbleeding duodenal and gastric ulcers without stigmata of bleeding. Gastric biopsies benign  . ESOPHAGOGASTRODUODENOSCOPY N/A 02/14/2016   Procedure: ESOPHAGOGASTRODUODENOSCOPY (EGD);  Surgeon: Daneil Dolin, MD;  [facility];  Service: Endoscopy;  Laterality: N/A;  300  . GIVENS CAPSULE STUDY N/A 11/27/2015   NOT DONE, WAS NOT INDICATED AT THE TIME  . MALONEY DILATION N/A 02/14/2016   Procedure: Venia Minks DILATION;  Surgeon: Daneil Dolin, MD;  [facility];  Service: Endoscopy;  Laterality: N/A;  . small bowel capsule study  03/2005   Dr. Laural Golden: two small Dieulafoy lesions at ascending colon. tiney specks of blood at duodenal bulb but no identified lesions  . TONSILLECTOMY      Family History  Problem Relation Age of Onset  . Hypertension Mother   . Stroke Mother   .  Leukemia Father 37  . Colon cancer Neg Hx     Social history:  reports that she quit smoking about 18 months ago. Her smoking use included Cigarettes. She has a 35.00 pack-year smoking history. She has never used smokeless tobacco. She reports that she does not drink alcohol or use drugs.  Medications:  Prior to Admission medications   Medication Sig Start Date End Date Taking? Authorizing Provider  acetaminophen  (TYLENOL) 500 MG tablet Take 500 mg by mouth every 6 (six) hours as needed.   Yes [provider]  albuterol (PROVENTIL HFA) 108 (90 Base) MCG/ACT inhaler Inhale 2 puffs into the lungs every 4 (four) hours as needed for wheezing or shortness of breath. 01/22/17  Yes Luking, Elayne Snare, MD  ALPRAZolam (XANAX) 0.25 MG tablet TAKE 1 TABLET BY MOUTH TWICE A DAY AS NEEDED Patient taking differently: Takes one tablet every morning. *TAKE 1 TABLET BY MOUTH TWICE A DAY AS NEEDED. 09/25/16  Yes Luking, Scott A, MD  amLODipine (NORVASC) 2.5 MG tablet Take 1 tablet (2.5 mg total) by mouth daily. 12/12/16  Yes Luking, Elayne Snare, MD  ASCORBIC ACID PO Take 500 mg by mouth every morning.    Yes [provider]  Calcium Carb-Cholecalciferol (CALCIUM + D3 PO) Take 1,200 mg by mouth every morning.    Yes [provider]  citalopram (CELEXA) 10 MG tablet TAKE 1 TABLET BY MOUTH EVERY DAY 09/25/16  Yes Luking, Scott A, MD  pantoprazole (PROTONIX) 40 MG tablet Take 1 tablet (40 mg total) by mouth daily. For treatment of the ulcer. 01/22/17  Yes [name], MD     No Known Allergies  ROS:  Out of a complete 14 system review of symptoms, the patient complains only of the following symptoms, and all other reviewed systems are negative.  Fatigue Hearing loss on the right Skin rash Blurred vision, loss of vision Shortness of breath, cough Confusion, weakness, slurred speech Anxiety, decreased energy  Blood pressure (!) 149/69, pulse (!) 58, height 4\' 11"  (1.499 m), weight 104 lb (47.2 kg).  Physical Exam  General: The patient is alert and cooperative at the time of the examination.  Eyes: Pupils are equal, round, and reactive to light. Discs are flat bilaterally.  Neck: The neck is supple, no carotid bruits are noted.  Respiratory: The respiratory examination is clear.  Cardiovascular: The cardiovascular examination reveals a regular rate and rhythm, no obvious murmurs or rubs are  noted.  Skin: Extremities are without significant edema.  Neurologic Exam  Mental status: The patient is alert and oriented x 3 at the time of the examination. The patient has apparent normal recent and remote memory, with an apparently normal attention span and concentration ability.  Cranial nerves: Facial symmetry is present. There is good sensation of the face to pinprick on the left, decreased on the right, soft touch is decreased on the left, better on the right. The patient splits the midline with vibration sensation on the forehead, decreased on the right. The strength of the facial muscles and the muscles to head turning and shoulder shrug are normal bilaterally. Speech is well enunciated, no aphasia or dysarthria is noted. Extraocular movements are full. Visual fields are full with the left eye, the patient has a right homonymous visual field deficit with the right eye, and when using both eyes together there is a right homonymous visual field deficit. The tongue is midline, and the patient has symmetric elevation of the soft palate. No  obvious hearing deficits are noted.  Motor: The motor testing reveals 5 over 5 strength of all 4 extremities, the patient has poor effort or giveaway weakness on the right arm and right leg, no true weakness is seen. Good symmetric motor tone is noted throughout.  Sensory: Sensory testing is intact to pinprick, soft touch, vibration sensation, and position sense on the left extremities. The patient reports anesthesia to soft touch and pinprick on the right arm and right leg, she reports decreased vibration sensation and position sensation on the right arm and right leg.   Coordination: Cerebellar testing reveals good finger-nose-finger and heel-to-shin bilaterally. The patient is able to manipulate objects in the right hand normally.  Gait and station: Gait is wide-based and staggery, the patient staggers about the room but does not actually fall down,  normally she uses a walker for ambulation. Tandem gait was not attempted. Romberg is negative. No drift is seen.  Reflexes: Deep tendon reflexes are symmetric and normal bilaterally, with exception that there is some decrease in the right biceps reflex as compared to the left. Toes are downgoing bilaterally.   MRI brain 01/17/17:  IMPRESSION: 1. No acute intracranial abnormality. 2.  Moderate chronic microvascular ischemic changes. 3. Moderate parenchymal volume loss of the brain with parietal predominance.  * MRI scan images were reviewed online. I agree with the written report.    Assessment/Plan:  1. Right visual field deficit, right hemisensory deficit, nonorganic neurologic examination  2. Gait disorder  The clinical examination appears to be consistent with a nonorganic deficit. The patient has a nonorganic visual field testing, she splits the midline with vibration sensation on the right forehead, she has reported total anesthesia to pinprick and soft touch on the right side the body, yet she has no evidence of a sensory ataxia, she has giveaway weakness on the right arm and right leg, and a somewhat functional gait disorder. 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 The patient will undergo a carotid Doppler study, I will set her up for home health physical therapy for her gait. She will follow-up through this office if needed. She should be on an antiplatelet agent when deemed safe given her peptic ulcer disease.  Jill Alexanders MD 03/07/2017 11:09 AM  [facility] [address] [place], [place] [address]  Phone [phone] Fax [phone]

## 2017-03-11 ENCOUNTER — Telehealth: Payer: Self-pay | Admitting: Neurology

## 2017-03-11 NOTE — Telephone Encounter (Signed)
Hinton Dyer- can you look into this? Looks like you sent referral to [facility] that Dr Jannifer Franklin placed

## 2017-03-11 NOTE — Telephone Encounter (Signed)
I called and gave verbal orders for physical therapy for this patient. I talked to Jackson.

## 2017-03-11 NOTE — Telephone Encounter (Signed)
[email or web address] calling re: the orders for PT that she received that were not signed, please call so that she can complete

## 2017-03-11 NOTE — Telephone Encounter (Signed)
Dr Jannifer Franklin- okay for me to call and give verbal order?

## 2017-03-11 NOTE — Telephone Encounter (Signed)
Emma call vicky back back she a verbal on order please. See Telephone note below. Verbal has to be from RN or MD. Maris Berger.

## 2017-03-12 NOTE — Telephone Encounter (Signed)
Received fax order for CW,MD to sign for PT to assess needs for admission to home health for gait training from [facility].  Faxed signed order back at [phone]. Received confirmation.

## 2017-03-17 NOTE — Telephone Encounter (Signed)
Lottie at [facility] would like last PT order faxed to [phone]. It was  previously faxed to [phone] which is not a good fax number.

## 2017-03-17 NOTE — Telephone Encounter (Signed)
[name] with [facility] said she just evaluated patient and she does not need PT. She is at baseline. Her son is independent with exercise program [name] set up with patient.

## 2017-03-17 NOTE — Telephone Encounter (Signed)
Re-faxed signed order to [phone] as requested. Received fax confirmation.

## 2017-03-17 NOTE — Telephone Encounter (Signed)
Events noted

## 2017-03-18 NOTE — Telephone Encounter (Signed)
Received fax from [facility] requesting start of care orders for to start 03/17/17. I called and spoke with Jari Sportsman, RN and she stated to disregard this. That since they evaluated her yesterday and does not need PT, order request can be disregarded. Nothing further needed at this time.

## 2017-03-19 ENCOUNTER — Ambulatory Visit (INDEPENDENT_AMBULATORY_CARE_PROVIDER_SITE_OTHER): Payer: Medicare Other | Admitting: Family Medicine

## 2017-03-19 ENCOUNTER — Encounter: Payer: Self-pay | Admitting: Family Medicine

## 2017-03-19 VITALS — BP 142/76 | Ht <= 58 in | Wt 104.0 lb

## 2017-03-19 DIAGNOSIS — I10 Essential (primary) hypertension: Secondary | ICD-10-CM | POA: Diagnosis not present

## 2017-03-19 NOTE — Progress Notes (Signed)
   Subjective:    Patient ID: Carol Massey, female    DOB: 1925/10/20, 83 y.o.   MRN: [id number]  Hypertension  This is a recurrent problem. The current episode started more than 1 year ago. Pertinent negatives include no chest pain, headaches or shortness of breath.  Diet is good,has gained a few lbs in the last month. Gets some exercise. Had a hospitalization a month in a half ago which they had thought was a TIA,but had some right side weakness,and is now stronger in that side. No other concerns.   Review of Systems  Constitutional: Negative for activity change, fatigue and fever.  Respiratory: Negative for cough and shortness of breath.   Cardiovascular: Negative for chest pain and leg swelling.  Neurological: Negative for headaches.       Objective:   Physical Exam  Constitutional: She appears well-nourished. No distress.  Cardiovascular: Normal rate, regular rhythm and normal heart sounds.   No murmur heard. Pulmonary/Chest: Effort normal and breath sounds normal. No respiratory distress.  Musculoskeletal: She exhibits no edema.  Lymphadenopathy:    She has no cervical adenopathy.  Neurological: She is alert. She exhibits normal muscle tone.  Psychiatric: Her behavior is normal.  Vitals reviewed.         Assessment & Plan:   Blood pressure decent control We will discuss with gastroenterology if low-dose aspirin acceptable patient had peptic ulcer disease over year ago currently on PPI Moods are doing good continue citalopram follow-up 3 months

## 2017-03-25 ENCOUNTER — Encounter (HOSPITAL_COMMUNITY): Payer: Medicare Other

## 2017-03-25 ENCOUNTER — Encounter (HOSPITAL_BASED_OUTPATIENT_CLINIC_OR_DEPARTMENT_OTHER): Payer: Medicare Other | Admitting: Adult Health

## 2017-03-25 ENCOUNTER — Encounter (HOSPITAL_COMMUNITY): Payer: Self-pay | Admitting: Adult Health

## 2017-03-25 VITALS — BP 140/55 | HR 57 | Resp 16 | Ht 59.0 in | Wt 105.0 lb

## 2017-03-25 DIAGNOSIS — D508 Other iron deficiency anemias: Secondary | ICD-10-CM | POA: Diagnosis not present

## 2017-03-25 DIAGNOSIS — D500 Iron deficiency anemia secondary to blood loss (chronic): Secondary | ICD-10-CM

## 2017-03-25 DIAGNOSIS — E876 Hypokalemia: Secondary | ICD-10-CM

## 2017-03-25 DIAGNOSIS — D509 Iron deficiency anemia, unspecified: Secondary | ICD-10-CM | POA: Diagnosis not present

## 2017-03-25 DIAGNOSIS — Q2733 Arteriovenous malformation of digestive system vessel: Secondary | ICD-10-CM

## 2017-03-25 LAB — BASIC METABOLIC PANEL
Anion gap: 9 (ref 5–15)
BUN: 14 mg/dL (ref 6–20)
CALCIUM: 10.1 mg/dL (ref 8.9–10.3)
CHLORIDE: 101 mmol/L (ref 101–111)
CO2: 28 mmol/L (ref 22–32)
CREATININE: 0.76 mg/dL (ref 0.44–1.00)
GFR calc non Af Amer: >60 mL/min (ref >60)
Glucose, Bld: 128 mg/dL — ABNORMAL HIGH (ref 65–99)
Potassium: 3.2 mmol/L — ABNORMAL LOW (ref 3.5–5.1)
SODIUM: 138 mmol/L (ref 135–145)

## 2017-03-25 LAB — IRON AND TIBC
Iron: 51 ug/dL (ref 28–170)
SATURATION RATIOS: 17 % (ref 10.4–31.8)
TIBC: 304 ug/dL (ref 250–450)
UIBC: 253 ug/dL

## 2017-03-25 LAB — CBC WITH DIFFERENTIAL/PLATELET
BASOS PCT: 1 %
Basophils Absolute: 0.1 10*3/uL (ref 0.0–0.1)
EOS ABS: 0.1 10*3/uL (ref 0.0–0.7)
EOS PCT: 1 %
HCT: 39.2 % (ref 36.0–46.0)
Hemoglobin: 13 g/dL (ref 12.0–15.0)
LYMPHS ABS: 1.5 10*3/uL (ref 0.7–4.0)
Lymphocytes Relative: 22 %
MCH: 32 pg (ref 26.0–34.0)
MCHC: 33.2 g/dL (ref 30.0–36.0)
MCV: 96.6 fL (ref 78.0–100.0)
MONO ABS: 0.4 10*3/uL (ref 0.1–1.0)
MONOS PCT: 7 %
Neutro Abs: 4.6 10*3/uL (ref 1.7–7.7)
Neutrophils Relative %: 69 %
Platelets: 218 10*3/uL (ref 150–400)
RBC: 4.06 MIL/uL (ref 3.87–5.11)
RDW: 13 % (ref 11.5–15.5)
WBC: 6.6 10*3/uL (ref 4.0–10.5)

## 2017-03-25 LAB — FERRITIN: Ferritin: 74 ng/mL (ref 11–307)

## 2017-03-25 MED ORDER — POTASSIUM CHLORIDE CRYS ER 20 MEQ PO TBCR: 20.0000 meq | EXTENDED_RELEASE_TABLET | Freq: Two times a day (BID) | ORAL | 0 refills | Status: DC

## 2017-03-25 NOTE — Progress Notes (Signed)
[place], [place] [address]   CLINIC:  Medical Oncology/Hematology  PCP:  [name], MD [address] [place] [address] [phone]   REASON FOR VISIT:  Follow-up for Iron deficiency anemia   CURRENT THERAPY: IV iron prn     HISTORY OF PRESENT ILLNESS:  (From [name], PA-C's last note on 12/03/16)     INTERVAL HISTORY:  Ms. Telleria 82 y.o. female returns for follow-up for iron deficiency anemia.   She is here today with her husband and her son.  Of note, patient's son does most of the talking during our visit today at the request of the patient. "I don't hear too well and he helps me with everything." Patient's son lives with his parents.    Her son tells me that she has reported some fatigue; energy levels 50%. Her appetite is excellent at 100%.  She was having trouble swallowing (since h/o stroke & TIAs), but it is improved.  She has residual right-sided weakness that "comes and goes." She has associated pain to her right side, arm, leg, and head at times; this has been chronic since most recent TIA.  She has intermittent dizziness as well, which has been chronic and is largely unchanged.    Chart reviewed; she did have ED visit on 01/17/17 for confusion, which was possibly secondary to ? TIA vs medications.   Denies any bleeding episodes including blood in her stool, dark/tarry stools, hematuria, dysuria, vaginal bleeding, nosebleeds, or gingival bleeding.   Dr. Wolfgang Phoenix is her PCP and she sees him regularly.    REVIEW OF SYSTEMS:  Review of Systems  Constitutional: Positive for fatigue. Negative for chills and fever.  HENT:   Positive for trouble swallowing. Negative for lump/mass and nosebleeds.   Eyes: Negative.   Respiratory: Negative.  Negative for cough and shortness of breath.   Cardiovascular: Negative.  Negative for chest pain and leg swelling.  Gastrointestinal: Negative.  Negative for abdominal pain,  blood in stool, constipation, diarrhea, nausea and vomiting.  Endocrine: Negative.   Genitourinary: Negative.  Negative for dysuria and hematuria.   Musculoskeletal: Negative for arthralgias.  Skin: Negative.  Negative for rash.  Neurological: Positive for dizziness and extremity weakness. Negative for headaches.  Hematological: Negative.  Negative for adenopathy. Does not bruise/bleed easily.  Psychiatric/Behavioral: Negative.  Negative for depression. The patient is not nervous/anxious.      PAST MEDICAL/SURGICAL HISTORY:  Past Medical History:  Diagnosis Date  . Anemia   . COPD (chronic obstructive pulmonary disease) ([place])   . Dieulafoy lesion of colon 2006  . Gastric AVM 2014  . Generalized anxiety disorder 07/24/2015  . Hard of hearing   . Hypertension   . Iron deficiency anemia due to chronic blood loss 05/27/2014  . Major depression in remission ([place]) 07/24/2015  . Osteoporosis   . PUD (peptic ulcer disease) 11/27/2015   Gastric and duodenal ulcer on EGD  . Stroke ([place])   . Unsteady gait   . Upper GI bleed 11/27/2015   Past Surgical History:  Procedure Laterality Date  . ABDOMINAL HYSTERECTOMY    . APPENDECTOMY    . COLONOSCOPY  03/2005   Dr. Gala Romney: rectal and rectosigmoid polyps removed  . COLONOSCOPY WITH ESOPHAGOGASTRODUODENOSCOPY (EGD) N/A 08/12/2013   Dr. Gala Romney: colonoscopy with tubular adenoma, colonic diverticulosis. EGD with non-critical Schatzki's ring, non-manipulated, and gastric AVM with evidence of recent bleeding, s/p thermal sealing and hemostasis clip  . ESOPHAGEAL  DILATION  11/27/2015   Procedure: ESOPHAGEAL DILATION;  Surgeon: Daneil Dolin, MD;  [facility];  Service: Endoscopy;;  . ESOPHAGOGASTRODUODENOSCOPY  03/2005   Dr. Gala Romney: tiny distal esophageal erosions  . ESOPHAGOGASTRODUODENOSCOPY N/A 11/27/2015   RMR: Cricopharyngeus web, status post dilation. Nonbleeding duodenal and gastric ulcers without stigmata of bleeding. Gastric biopsies  benign  . ESOPHAGOGASTRODUODENOSCOPY N/A 02/14/2016   Procedure: ESOPHAGOGASTRODUODENOSCOPY (EGD);  Surgeon: Daneil Dolin, MD;  [facility];  Service: Endoscopy;  Laterality: N/A;  300  . GIVENS CAPSULE STUDY N/A 11/27/2015   NOT DONE, WAS NOT INDICATED AT THE TIME  . MALONEY DILATION N/A 02/14/2016   Procedure: Venia Minks DILATION;  Surgeon: Daneil Dolin, MD;  [facility];  Service: Endoscopy;  Laterality: N/A;  . small bowel capsule study  03/2005   Dr. Laural Golden: two small Dieulafoy lesions at ascending colon. tiney specks of blood at duodenal bulb but no identified lesions  . TONSILLECTOMY       SOCIAL HISTORY:  Social History   Social History  . Marital status: Married    Spouse name: N/A  . Number of children: 3  . Years of education: N/A   Occupational History  . retired from [employer] History Main Topics  . Smoking status: Former Smoker    Packs/day: 0.50    Years: 70.00    Types: Cigarettes    Quit date: 08/27/2015  . Smokeless tobacco: Never Used     Comment: about 1/2 ppd  . Alcohol use No  . Drug use: No  . Sexual activity: Not on file   Other Topics Concern  . Not on file   Social History Narrative   Lives with husband and son   Caffeine use: 1 cup a day of coffee   Diet coke- 16oz daily   Right handed    FAMILY HISTORY:  Family History  Problem Relation Age of Onset  . Hypertension Mother   . Stroke Mother   . Leukemia Father 56  . Colon cancer Neg Hx     CURRENT MEDICATIONS:  Outpatient Encounter Prescriptions as of 03/25/2017  Medication Sig  . acetaminophen (TYLENOL) 500 MG tablet Take 500 mg by mouth every 6 (six) hours as needed.  [name] albuterol (PROVENTIL HFA) 108 (90 Base) MCG/ACT inhaler Inhale 2 puffs into the lungs every 4 (four) hours as needed for wheezing or shortness of breath.  . ALPRAZolam (XANAX) 0.25 MG tablet TAKE 1 TABLET BY MOUTH TWICE A DAY AS NEEDED (Patient taking differently: Takes one tablet  every morning. *TAKE 1 TABLET BY MOUTH TWICE A DAY AS NEEDED.)  . amLODipine (NORVASC) 2.5 MG tablet Take 1 tablet (2.5 mg total) by mouth daily.  . ASCORBIC ACID PO Take 500 mg by mouth every morning.   . Calcium Carb-Cholecalciferol (CALCIUM + D3 PO) Take 1,200 mg by mouth every morning.   . citalopram (CELEXA) 10 MG tablet TAKE 1 TABLET BY MOUTH EVERY DAY (Patient taking differently: takes 1/2 tablet every morning)  . pantoprazole (PROTONIX) 40 MG tablet Take 1 tablet (40 mg total) by mouth daily. For treatment of the ulcer.  . potassium chloride SA (K-DUR,KLOR-CON) 20 MEQ tablet Take 1 tablet (20 mEq total) by mouth 2 (two) times daily.   Facility-Administered Encounter Medications as of 03/25/2017  Medication  . sodium chloride flush (NS) 0.9 % injection 10 mL    ALLERGIES:  No Known Allergies   PHYSICAL EXAM:  ECOG  Performance status: 3 - Symptomatic; requires regular assistance   Vitals:   03/25/17 1301  BP: (!) 140/55  Pulse: (!) 57  Resp: 16   Filed Weights   03/25/17 1301  Weight: 105 lb (47.6 kg)    Physical Exam  Constitutional: She is oriented to person, place, and time.  Elderly female in no acute distress -Examined seated in wheelchair   HENT:  Head: Normocephalic.  Mouth/Throat: Oropharynx is clear and moist. No oropharyngeal exudate.  Hard of hearing   Eyes: Pupils are equal, round, and reactive to light. Conjunctivae are normal. No scleral icterus.  Neck: Normal range of motion. Neck supple.  Cardiovascular: Normal rate and regular rhythm.   Pulmonary/Chest: Effort normal. No respiratory distress. She has no wheezes.  Diminished breath sounds to bilat bases   Abdominal: Soft. Bowel sounds are normal. There is no tenderness. There is no rebound.  Musculoskeletal: She exhibits no edema.  Lymphadenopathy:    She has no cervical adenopathy.  Neurological: She is alert and oriented to person, place, and time.  (R) sided weakness   Skin: Skin is warm and  dry.  Psychiatric: Mood, memory, affect and judgment normal.  Nursing note and vitals reviewed.    LABORATORY DATA:  I have reviewed the labs as listed.  CBC    Component Value Date/Time   WBC 6.6 03/25/2017 1152   RBC 4.06 03/25/2017 1152   HGB 13.0 03/25/2017 1152   HGB 11.2 02/12/2016 1140   HCT 39.2 03/25/2017 1152   HCT 36.5 02/12/2016 1140   PLT 218 03/25/2017 1152   PLT 303 02/12/2016 1140   MCV 96.6 03/25/2017 1152   MCV 87 02/12/2016 1140   MCH 32.0 03/25/2017 1152   MCHC 33.2 03/25/2017 1152   RDW 13.0 03/25/2017 1152   RDW 19.2 (H) 02/12/2016 1140   LYMPHSABS 1.5 03/25/2017 1152   LYMPHSABS 1.7 02/12/2016 1140   MONOABS 0.4 03/25/2017 1152   EOSABS 0.1 03/25/2017 1152   EOSABS 0.1 02/12/2016 1140   BASOSABS 0.1 03/25/2017 1152   BASOSABS 0.1 02/12/2016 1140   CMP Latest Ref Rng & Units 03/25/2017 01/28/2017 01/17/2017  Glucose 65 - 99 mg/dL 128(H) 121(H) 103(H)  BUN 6 - 20 mg/dL 14 15 17   Creatinine 0.44 - 1.00 mg/dL 0.76 0.83 0.80  Sodium 135 - 145 mmol/L 138 138 139  Potassium 3.5 - 5.1 mmol/L 3.2(L) 4.0 3.4(L)  Chloride 101 - 111 mmol/L 101 102 101  CO2 22 - 32 mmol/L 28 27 -  Calcium 8.9 - 10.3 mg/dL 10.1 10.2 -  Total Protein 6.5 - 8.1 g/dL - - -  Total Bilirubin 0.3 - 1.2 mg/dL - - -  Alkaline Phos 38 - 126 U/L - - -  AST 15 - 41 U/L - - -  ALT 14 - 54 U/L - - -    PENDING LABS:    DIAGNOSTIC IMAGING:    PATHOLOGY:     ASSESSMENT & PLAN:   Iron deficiency anemia:  -Thought to be secondary to GI AVMs. Gastric lesions were seen on prior EGD.  -Iron studies are pending for today. Ferritin has been >100 for several months. We will contact her with lab results and make arrangements for IV iron, if needed.  -Last IV iron dose was in 09/2016 Oncology Flowsheet 10/10/2016  ferumoxytol [facility]) IV 510 mg  -Hgb is normal at 13 g/dL. Remainder of CBC is normal as well.   -Given stability of labs in recent months, will move her  labs to every 2  months.  -Return to cancer center in 4 months for follow-up with labs.    Hypokalemia:  -Could be secondary to anti-hypertensive medications.  -K 3.2 today. Will replete with K-Dur 20 mEq BID x 2 weeks. Recommended that she contact PCP for continued management of hypokalemia as they deem necessary.      Dispo:  -Labs every 2 months (standing orders placed today; CBC with diff, BMP, iron studies) -Return to cancer center in 4 months with labs.    All questions were answered to patient's stated satisfaction. Encouraged patient to call with any new concerns or questions before her next visit to the cancer center and we can certain see her sooner, if needed.    Plan of care discussed with Dr. Talbert Cage, who agrees with the above aforementioned.    Orders placed this encounter:  Orders Placed This Encounter  Procedures  . Basic metabolic panel  . CBC with Differential/Platelet  . Iron and TIBC  . Ferritin      Mike Craze, NP [place] [phone]

## 2017-03-25 NOTE — Patient Instructions (Addendum)
[place] at [facility] Discharge Instructions  RECOMMENDATIONS MADE BY THE CONSULTANT AND ANY TEST RESULTS WILL BE SENT TO YOUR REFERRING PHYSICIAN.  You were seen today by Mike Craze NP. Follow up with PCP about low potassium. Potassium prescription sent to her pharmacy. Labs every 2 months. Return in 4 months for labs and follow up.    Thank you for choosing [place] at [facility] to provide your oncology and hematology care.  To afford each patient quality time with our provider, please arrive at least 15 minutes before your scheduled appointment time.    If you have a lab appointment with the [place] please come in thru the  Main Entrance and check in at the main information desk  You need to re-schedule your appointment should you arrive 10 or more minutes late.  We strive to give you quality time with our providers, and arriving late affects you and other patients whose appointments are after yours.  Also, if you no show three or more times for appointments you may be dismissed from the clinic at the providers discretion.     Again, thank you for choosing [facility].  Our hope is that these requests will decrease the amount of time that you wait before being seen by our physicians.       _____________________________________________________________  Should you have questions after your visit to [facility], please contact our office at [phone] between the hours of 8:30 a.m. and 4:30 p.m.  Voicemails left after 4:30 p.m. will not be returned until the following business day.  For prescription refill requests, have your pharmacy contact our office.       Resources For Cancer Patients and their Caregivers ? American Cancer Society: Can assist with transportation, wigs, general needs, runs Look Good Feel Better.        434-066-8661 ? Cancer Care: Provides financial assistance, online  support groups, medication/co-pay assistance.  1-800-813-HOPE 445-741-0714) ? [place] [facility] Assists [place] cancer patients and their families through emotional , educational and financial support.  973-620-9059 ? [place] DSS Where to apply for food stamps, Medicaid and utility assistance. 7156174606 ? [employer]: Transportation to medical appointments. (978) 771-5755 ? Social Security Administration: May apply for disability if have a Stage IV cancer. 438-465-9362 (303)129-9902 ? [employer], Disability and Transit Services: Assists with nutrition, care and transit needs. [place] Support Programs: @10RELATIVEDAYS @ > Cancer Support Group  2nd Tuesday of the month 1pm-2pm, Journey Room  > Creative Journey  3rd Tuesday of the month 1130am-1pm, Journey Room  > Look Good Feel Better  1st Wednesday of the month 10am-12 noon, Journey Room (Call [place] to register [phone])

## 2017-03-26 ENCOUNTER — Telehealth: Payer: Self-pay | Admitting: Neurology

## 2017-03-26 ENCOUNTER — Ambulatory Visit (HOSPITAL_COMMUNITY)
Admission: RE | Admit: 2017-03-26 | Discharge: 2017-03-26 | Disposition: A | Discharge status: Home / Self Care | Payer: Medicare Other | Source: Ambulatory Visit | Attending: Neurology | Admitting: Neurology

## 2017-03-26 DIAGNOSIS — I6523 Occlusion and stenosis of bilateral carotid arteries: Secondary | ICD-10-CM | POA: Insufficient documentation

## 2017-03-26 DIAGNOSIS — R29818 Other symptoms and signs involving the nervous system: Secondary | ICD-10-CM

## 2017-03-26 LAB — VAS US CAROTID
LCCADSYS: 78 cm/s
LCCAPDIAS: 13 cm/s
LEFT ECA DIAS: -4 cm/s
LEFT VERTEBRAL DIAS: 7 cm/s
Left CCA dist dias: 15 cm/s
Left CCA prox sys: 93 cm/s
Left ICA dist dias: -16 cm/s
Left ICA dist sys: -82 cm/s
Left ICA prox dias: 17 cm/s
Left ICA prox sys: 81 cm/s
RCCADSYS: -60 cm/s
RCCAPDIAS: 8 cm/s
RCCAPSYS: 55 cm/s
RIGHT ECA DIAS: -7 cm/s
RIGHT VERTEBRAL DIAS: 9 cm/s

## 2017-03-26 NOTE — Telephone Encounter (Signed)
I called the family, carotid Doppler study is normal.  Carotid doppler 03/26/17:  Summary:  - The vertebral arteries appear patent with antegrade flow. - Findings consistent with 1-39 percent stenosis involving the right internal carotid artery and the left internal carotid artery.

## 2017-03-26 NOTE — Progress Notes (Signed)
*  PRELIMINARY RESULTS* Vascular Ultrasound Carotid Duplex (Doppler) has been completed.  Preliminary findings: Bilateral: No significant (1-39%) ICA stenosis. Antegrade vertebral flow.    Landry Mellow, RDMS, RVT  03/26/2017, 1:19 PM

## 2017-03-27 ENCOUNTER — Other Ambulatory Visit (HOSPITAL_COMMUNITY): Payer: Self-pay | Admitting: Adult Health

## 2017-03-28 ENCOUNTER — Telehealth: Payer: Self-pay | Admitting: Family Medicine

## 2017-03-28 NOTE — Telephone Encounter (Signed)
[place] ONLY  Son called and just wanted to let us know that the [place] Doctor has put his mom back on Potassium and they are giving her a unit of Iron next week.

## 2017-04-02 ENCOUNTER — Encounter (HOSPITAL_COMMUNITY): Payer: Medicare Other | Attending: Oncology

## 2017-04-02 ENCOUNTER — Encounter (HOSPITAL_COMMUNITY): Payer: Self-pay

## 2017-04-02 VITALS — BP 142/57 | HR 58 | Temp 98.4°F | Resp 18

## 2017-04-02 DIAGNOSIS — D500 Iron deficiency anemia secondary to blood loss (chronic): Secondary | ICD-10-CM | POA: Insufficient documentation

## 2017-04-02 DIAGNOSIS — Q2733 Arteriovenous malformation of digestive system vessel: Secondary | ICD-10-CM

## 2017-04-02 MED ORDER — SODIUM CHLORIDE 0.9 % IV SOLN
510.0000 mg | Freq: Once | INTRAVENOUS | Status: AC
  Administered 2017-04-02: 510 mg via INTRAVENOUS
  Filled 2017-04-02: qty 17

## 2017-04-02 MED ORDER — SODIUM CHLORIDE 0.9% FLUSH: 3.0000 mL | Freq: Once | INTRAVENOUS | Status: DC | PRN

## 2017-04-02 MED ORDER — SODIUM CHLORIDE 0.9 % IV SOLN
Freq: Once | INTRAVENOUS | Status: AC
  Administered 2017-04-02: 14:00:00 via INTRAVENOUS

## 2017-04-02 NOTE — Patient Instructions (Signed)
[facility] at [facility] Discharge Instructions  RECOMMENDATIONS MADE BY THE CONSULTANT AND ANY TEST RESULTS WILL BE SENT TO YOUR REFERRING PHYSICIAN.  You received your iron infusion today. Follow up as scheduled.  Thank you for choosing [facility] at [facility] to provide your oncology and hematology care.  To afford each patient quality time with our provider, please arrive at least 15 minutes before your scheduled appointment time.    If you have a lab appointment with the Cancer Center please come in thru the  Main Entrance and check in at the main information desk  You need to re-schedule your appointment should you arrive 10 or more minutes late.  We strive to give you quality time with our providers, and arriving late affects you and other patients whose appointments are after yours.  Also, if you no show three or more times for appointments you may be dismissed from the clinic at the providers discretion.     Again, thank you for choosing [facility].  Our hope is that these requests will decrease the amount of time that you wait before being seen by our physicians.       _____________________________________________________________  Should you have questions after your visit to [facility], please contact our office at [phone] between the hours of 8:30 a.m. and 4:30 p.m.  Voicemails left after 4:30 p.m. will not be returned until the following business day.  For prescription refill requests, have your pharmacy contact our office.       Resources For Cancer Patients and their Caregivers ? American Cancer Society: Can assist with transportation, wigs, general needs, runs Look Good Feel Better.        1-888-227-6333 ? Cancer Care: Provides financial assistance, online support groups, medication/co-pay assistance.  1-800-813-HOPE (4673) ? [facility] Assists [place]  cancer patients and their families through emotional , educational and financial support.  336-427-4357 ? [place] DSS Where to apply for food stamps, Medicaid and utility assistance. 336-342-1394 ? RCATS: Transportation to medical appointments. 336-347-2287 ? Social Security Administration: May apply for disability if have a Stage IV cancer. 336-342-7796 1-800-772-1213 ? [place] Aging, Disability and Transit Services: Assists with nutrition, care and transit needs. 336-349-2343  Cancer Center Support Programs: @10RELATIVEDAYS@ > Cancer Support Group  2nd Tuesday of the month 1pm-2pm, Journey Room  > Creative Journey  3rd Tuesday of the month 1130am-1pm, Journey Room  > Look Good Feel Better  1st Wednesday of the month 10am-12 noon, Journey Room (Call American [employer] to register 1-800-395-5775)    

## 2017-04-03 NOTE — Progress Notes (Signed)
Late entry- Feraheme given as ordered. Patient tolerated well without problems. Vitals stable and discharged home from clinic via wheelchair with family at side. Follow up as scheduled.

## 2017-04-11 ENCOUNTER — Encounter (HOSPITAL_COMMUNITY)
Admission: RE | Admit: 2017-04-11 | Discharge: 2017-04-11 | Disposition: A | Discharge status: Home / Self Care | Payer: Medicare Other | Source: Ambulatory Visit | Attending: Family Medicine | Admitting: Family Medicine

## 2017-04-11 DIAGNOSIS — M810 Age-related osteoporosis without current pathological fracture: Secondary | ICD-10-CM | POA: Diagnosis present

## 2017-04-11 MED ORDER — SODIUM CHLORIDE 0.9 % IV SOLN
INTRAVENOUS | Status: DC
  Administered 2017-04-11: 250 mL via INTRAVENOUS

## 2017-04-11 MED ORDER — ZOLEDRONIC ACID 5 MG/100ML IV SOLN
INTRAVENOUS | Status: AC
  Filled 2017-04-11: qty 100

## 2017-04-11 MED ORDER — ZOLEDRONIC ACID 5 MG/100ML IV SOLN
5.0000 mg | Freq: Once | INTRAVENOUS | Status: AC
  Administered 2017-04-11: 5 mg via INTRAVENOUS

## 2017-04-27 ENCOUNTER — Other Ambulatory Visit (HOSPITAL_COMMUNITY): Payer: Self-pay | Admitting: Adult Health

## 2017-04-27 DIAGNOSIS — E876 Hypokalemia: Secondary | ICD-10-CM

## 2017-04-27 DIAGNOSIS — D500 Iron deficiency anemia secondary to blood loss (chronic): Secondary | ICD-10-CM

## 2017-05-23 ENCOUNTER — Other Ambulatory Visit: Payer: Self-pay | Admitting: *Deleted

## 2017-05-23 MED ORDER — AMLODIPINE BESYLATE 2.5 MG PO TABS: 2.5000 mg | ORAL_TABLET | Freq: Every day | ORAL | 0 refills | Status: DC

## 2017-05-26 ENCOUNTER — Other Ambulatory Visit (HOSPITAL_COMMUNITY): Payer: Self-pay | Admitting: Adult Health

## 2017-05-26 DIAGNOSIS — E876 Hypokalemia: Secondary | ICD-10-CM

## 2017-05-26 DIAGNOSIS — D500 Iron deficiency anemia secondary to blood loss (chronic): Secondary | ICD-10-CM

## 2017-05-27 ENCOUNTER — Encounter (HOSPITAL_COMMUNITY): Payer: Medicare Other | Attending: Oncology

## 2017-05-27 DIAGNOSIS — D500 Iron deficiency anemia secondary to blood loss (chronic): Secondary | ICD-10-CM | POA: Insufficient documentation

## 2017-05-27 DIAGNOSIS — E876 Hypokalemia: Secondary | ICD-10-CM

## 2017-05-27 LAB — CBC WITH DIFFERENTIAL/PLATELET
Basophils Absolute: 0 10*3/uL (ref 0.0–0.1)
Basophils Relative: 1 %
EOS PCT: 3 %
Eosinophils Absolute: 0.1 10*3/uL (ref 0.0–0.7)
HEMATOCRIT: 39.4 % (ref 36.0–46.0)
Hemoglobin: 12.9 g/dL (ref 12.0–15.0)
LYMPHS ABS: 1.3 10*3/uL (ref 0.7–4.0)
LYMPHS PCT: 25 %
MCH: 31.9 pg (ref 26.0–34.0)
MCHC: 32.7 g/dL (ref 30.0–36.0)
MCV: 97.3 fL (ref 78.0–100.0)
MONO ABS: 0.4 10*3/uL (ref 0.1–1.0)
Monocytes Relative: 8 %
NEUTROS ABS: 3.3 10*3/uL (ref 1.7–7.7)
Neutrophils Relative %: 63 %
PLATELETS: 185 10*3/uL (ref 150–400)
RBC: 4.05 MIL/uL (ref 3.87–5.11)
RDW: 12.9 % (ref 11.5–15.5)
WBC: 5.2 10*3/uL (ref 4.0–10.5)

## 2017-05-27 LAB — BASIC METABOLIC PANEL
ANION GAP: 8 (ref 5–15)
BUN: 13 mg/dL (ref 6–20)
CO2: 27 mmol/L (ref 22–32)
Calcium: 9.7 mg/dL (ref 8.9–10.3)
Chloride: 101 mmol/L (ref 101–111)
Creatinine, Ser: 0.74 mg/dL (ref 0.44–1.00)
GFR calc Af Amer: >60 mL/min (ref >60)
GFR calc non Af Amer: >60 mL/min (ref >60)
GLUCOSE: 117 mg/dL — AB (ref 65–99)
POTASSIUM: 3.9 mmol/L (ref 3.5–5.1)
Sodium: 136 mmol/L (ref 135–145)

## 2017-05-27 LAB — IRON AND TIBC
Iron: 79 ug/dL (ref 28–170)
SATURATION RATIOS: 30 % (ref 10.4–31.8)
TIBC: 267 ug/dL (ref 250–450)
UIBC: 188 ug/dL

## 2017-05-27 LAB — FERRITIN: Ferritin: 214 ng/mL (ref 11–307)

## 2017-06-06 ENCOUNTER — Other Ambulatory Visit (HOSPITAL_COMMUNITY): Payer: Self-pay | Admitting: *Deleted

## 2017-06-06 DIAGNOSIS — D500 Iron deficiency anemia secondary to blood loss (chronic): Secondary | ICD-10-CM

## 2017-06-06 DIAGNOSIS — E876 Hypokalemia: Secondary | ICD-10-CM

## 2017-06-06 MED ORDER — POTASSIUM CHLORIDE CRYS ER 20 MEQ PO TBCR: 20.0000 meq | EXTENDED_RELEASE_TABLET | Freq: Two times a day (BID) | ORAL | 0 refills | Status: DC

## 2017-06-10 ENCOUNTER — Other Ambulatory Visit: Payer: Self-pay | Admitting: Family Medicine

## 2017-06-10 NOTE — Telephone Encounter (Signed)
Last seen 03/19/17 

## 2017-06-10 NOTE — Telephone Encounter (Signed)
May have this +3 additional refills 

## 2017-06-24 ENCOUNTER — Ambulatory Visit (INDEPENDENT_AMBULATORY_CARE_PROVIDER_SITE_OTHER): Payer: Medicare Other | Admitting: Family Medicine

## 2017-06-24 ENCOUNTER — Encounter: Payer: Self-pay | Admitting: Family Medicine

## 2017-06-24 VITALS — BP 106/72 | Ht 59.0 in | Wt 105.2 lb

## 2017-06-24 DIAGNOSIS — D500 Iron deficiency anemia secondary to blood loss (chronic): Secondary | ICD-10-CM | POA: Diagnosis not present

## 2017-06-24 DIAGNOSIS — M6281 Muscle weakness (generalized): Secondary | ICD-10-CM

## 2017-06-24 DIAGNOSIS — E876 Hypokalemia: Secondary | ICD-10-CM

## 2017-06-24 DIAGNOSIS — R531 Weakness: Secondary | ICD-10-CM | POA: Diagnosis not present

## 2017-06-24 DIAGNOSIS — Z23 Encounter for immunization: Secondary | ICD-10-CM | POA: Diagnosis not present

## 2017-06-24 MED ORDER — POTASSIUM CHLORIDE CRYS ER 20 MEQ PO TBCR: 20.0000 meq | EXTENDED_RELEASE_TABLET | Freq: Two times a day (BID) | ORAL | 5 refills | Status: DC

## 2017-06-24 NOTE — Progress Notes (Signed)
   Subjective:    Patient ID: Carol Massey, female    DOB: 1926-01-13, 83 y.o.   MRN: [id number]  Hypertension  This is a chronic problem. The current episode started more than 1 year ago. Risk factors for coronary artery disease include post-menopausal state. Treatments tried: norvasc. There are no compliance problems.    Patient has been having onset of right side weakness progressive over the past week or 2.  Has had some weakness earlier this year did have an MRI earlier this year with code stroke did not find any evidence of a stroke at that time relates a lot of fatigue tiredness with right side weakness difficulty seeing in her right eye difficulty with balance family comes in today for further evaluation History of peripheral vascular disease and stroke  Also history of hypokalemia on taking potassium once daily currently.   Review of Systems Patient denies headache does have some difficulty thinking also visual difficulties especially the right visual field balance issues weakness issues right leg right arm weak denies chest tightness does have some shortness of breath associated with COPD no abdominal pain or rectal bleeding    Objective:   Physical Exam Neck no masses eardrums normal throat is normal visual fields right side significantly depressed lungs clear no crackles heart regular pulse normal weakness in the right arm is noted significant change compared to previous visit   25 minutes was spent with the patient. Greater than half the time was spent in discussion and answering questions and counseling regarding the issues that the patient came in for today.     Assessment & Plan:  Right-sided weakness Indications for MRI MRI without contrast Suspect stroke Doubt cancer Await findings. Continue current medications

## 2017-07-01 ENCOUNTER — Ambulatory Visit (HOSPITAL_COMMUNITY)
Admission: RE | Admit: 2017-07-01 | Discharge: 2017-07-01 | Disposition: A | Discharge status: Home / Self Care | Payer: Medicare Other | Source: Ambulatory Visit | Attending: Family Medicine | Admitting: Family Medicine

## 2017-07-01 DIAGNOSIS — M6281 Muscle weakness (generalized): Secondary | ICD-10-CM | POA: Diagnosis present

## 2017-07-01 DIAGNOSIS — R9389 Abnormal findings on diagnostic imaging of other specified body structures: Secondary | ICD-10-CM | POA: Insufficient documentation

## 2017-07-04 NOTE — Addendum Note (Signed)
Addended by: Karle Barr on: 07/04/2017 10:51 AM   Modules accepted: Orders

## 2017-07-04 NOTE — Progress Notes (Signed)
I called and spoke with the pt son and let him know that we have sched the pt for MRI c spine with out contrast for July 09, 2017 at 2:45.Son states he can not do that date he will call [facility] to reschedule. He will then call our office to sched the follow up visit with Dr.Scott.

## 2017-07-08 ENCOUNTER — Ambulatory Visit (HOSPITAL_COMMUNITY)
Admission: RE | Admit: 2017-07-08 | Discharge: 2017-07-08 | Disposition: A | Discharge status: Home / Self Care | Payer: Medicare Other | Source: Ambulatory Visit | Attending: Family Medicine | Admitting: Family Medicine

## 2017-07-08 DIAGNOSIS — M438X2 Other specified deforming dorsopathies, cervical region: Secondary | ICD-10-CM | POA: Insufficient documentation

## 2017-07-08 DIAGNOSIS — M4802 Spinal stenosis, cervical region: Secondary | ICD-10-CM | POA: Insufficient documentation

## 2017-07-08 DIAGNOSIS — M6281 Muscle weakness (generalized): Secondary | ICD-10-CM | POA: Diagnosis present

## 2017-07-08 DIAGNOSIS — M4310 Spondylolisthesis, site unspecified: Secondary | ICD-10-CM | POA: Insufficient documentation

## 2017-07-09 ENCOUNTER — Ambulatory Visit (HOSPITAL_COMMUNITY): Payer: Medicare Other

## 2017-07-10 ENCOUNTER — Other Ambulatory Visit: Payer: Self-pay | Admitting: Family Medicine

## 2017-07-10 DIAGNOSIS — G542 Cervical root disorders, not elsewhere classified: Secondary | ICD-10-CM

## 2017-07-14 ENCOUNTER — Ambulatory Visit: Payer: Medicare Other | Admitting: Family Medicine

## 2017-07-15 ENCOUNTER — Encounter: Payer: Self-pay | Admitting: Family Medicine

## 2017-07-25 ENCOUNTER — Other Ambulatory Visit: Payer: Self-pay | Admitting: Neurological Surgery

## 2017-07-25 ENCOUNTER — Telehealth: Payer: Self-pay | Admitting: Family Medicine

## 2017-07-25 DIAGNOSIS — Z01818 Encounter for other preprocedural examination: Secondary | ICD-10-CM

## 2017-07-25 NOTE — Telephone Encounter (Signed)
Patient went to the neurologist today.  He looked at her MRI and said her arthritis is causing curvature issues in her spine and neck and suggests she have surgery.  The neurologist believes that if she does not have this surgery then she will shortly end up in a wheelchair.  The surgery is scheduled for 08/11/17 tentatively.  They are going to clear this with the [place] as well.

## 2017-07-28 NOTE — Telephone Encounter (Signed)
I put in the referral

## 2017-07-28 NOTE — Telephone Encounter (Signed)
I discussed this with the son.  I believe the patient needs a urgent preoperative visit with cardiology.  I have concerns whether or not this patient from a cardiopulmonary standpoint can withstand the surgery.  Please go ahead with referral and asked that cardiology try to see her as soon as possible so if they feel she can do the surgery it can be completed

## 2017-07-29 ENCOUNTER — Ambulatory Visit (HOSPITAL_COMMUNITY): Payer: Medicare Other | Admitting: Adult Health

## 2017-07-29 ENCOUNTER — Other Ambulatory Visit (HOSPITAL_COMMUNITY): Payer: Medicare Other

## 2017-07-29 ENCOUNTER — Encounter: Payer: Self-pay | Admitting: Family Medicine

## 2017-07-30 ENCOUNTER — Encounter (HOSPITAL_BASED_OUTPATIENT_CLINIC_OR_DEPARTMENT_OTHER): Payer: Medicare Other | Admitting: Adult Health

## 2017-07-30 ENCOUNTER — Encounter (HOSPITAL_COMMUNITY): Payer: Self-pay | Admitting: Adult Health

## 2017-07-30 ENCOUNTER — Encounter (HOSPITAL_COMMUNITY): Payer: Medicare Other | Attending: Oncology

## 2017-07-30 VITALS — BP 134/53 | HR 65 | Temp 98.8°F | Resp 16 | Wt 106.9 lb

## 2017-07-30 DIAGNOSIS — E876 Hypokalemia: Secondary | ICD-10-CM | POA: Diagnosis not present

## 2017-07-30 DIAGNOSIS — Q2733 Arteriovenous malformation of digestive system vessel: Secondary | ICD-10-CM | POA: Diagnosis not present

## 2017-07-30 DIAGNOSIS — D500 Iron deficiency anemia secondary to blood loss (chronic): Secondary | ICD-10-CM | POA: Diagnosis present

## 2017-07-30 DIAGNOSIS — D508 Other iron deficiency anemias: Secondary | ICD-10-CM

## 2017-07-30 LAB — CBC WITH DIFFERENTIAL/PLATELET
BASOS ABS: 0 10*3/uL (ref 0.0–0.1)
BASOS PCT: 1 %
EOS ABS: 0.1 10*3/uL (ref 0.0–0.7)
EOS PCT: 2 %
HCT: 42.8 % (ref 36.0–46.0)
Hemoglobin: 13.6 g/dL (ref 12.0–15.0)
Lymphocytes Relative: 24 %
Lymphs Abs: 1.5 10*3/uL (ref 0.7–4.0)
MCH: 31.3 pg (ref 26.0–34.0)
MCHC: 31.8 g/dL (ref 30.0–36.0)
MCV: 98.6 fL (ref 78.0–100.0)
Monocytes Absolute: 0.5 10*3/uL (ref 0.1–1.0)
Monocytes Relative: 8 %
NEUTROS PCT: 65 %
Neutro Abs: 4 10*3/uL (ref 1.7–7.7)
PLATELETS: 196 10*3/uL (ref 150–400)
RBC: 4.34 MIL/uL (ref 3.87–5.11)
RDW: 12.3 % (ref 11.5–15.5)
WBC: 6.2 10*3/uL (ref 4.0–10.5)

## 2017-07-30 LAB — FERRITIN: Ferritin: 82 ng/mL (ref 11–307)

## 2017-07-30 LAB — BASIC METABOLIC PANEL
Anion gap: 8 (ref 5–15)
BUN: 17 mg/dL (ref 6–20)
CO2: 27 mmol/L (ref 22–32)
CREATININE: 0.74 mg/dL (ref 0.44–1.00)
Calcium: 10.2 mg/dL (ref 8.9–10.3)
Chloride: 102 mmol/L (ref 101–111)
Glucose, Bld: 129 mg/dL — ABNORMAL HIGH (ref 65–99)
Potassium: 3.8 mmol/L (ref 3.5–5.1)
Sodium: 137 mmol/L (ref 135–145)

## 2017-07-30 LAB — IRON AND TIBC
IRON: 55 ug/dL (ref 28–170)
SATURATION RATIOS: 17 % (ref 10.4–31.8)
TIBC: 321 ug/dL (ref 250–450)
UIBC: 266 ug/dL

## 2017-07-30 NOTE — Patient Instructions (Addendum)
[place] at [facility] Discharge Instructions  RECOMMENDATIONS MADE BY THE CONSULTANT AND ANY TEST RESULTS WILL BE SENT TO YOUR REFERRING PHYSICIAN.  You saw Mike Craze, NP, today. Return in 3 months for labs. Return in 6 months for labs and follow up.  See [name] at checkout for appointments.   Thank you for choosing [place] at [facility] to provide your oncology and hematology care.  To afford each patient quality time with our provider, please arrive at least 15 minutes before your scheduled appointment time.    If you have a lab appointment with the [place] please come in thru the  Main Entrance and check in at the main information desk  You need to re-schedule your appointment should you arrive 10 or more minutes late.  We strive to give you quality time with our providers, and arriving late affects you and other patients whose appointments are after yours.  Also, if you no show three or more times for appointments you may be dismissed from the clinic at the providers discretion.     Again, thank you for choosing [facility].  Our hope is that these requests will decrease the amount of time that you wait before being seen by our physicians.       _____________________________________________________________  Should you have questions after your visit to [facility], please contact our office at [phone] between the hours of 8:30 a.m. and 4:30 p.m.  Voicemails left after 4:30 p.m. will not be returned until the following business day.  For prescription refill requests, have your pharmacy contact our office.       Resources For Cancer Patients and their Caregivers ? American Cancer Society: Can assist with transportation, wigs, general needs, runs Look Good Feel Better.        6064790973 ? Cancer Care: Provides financial assistance, online support groups, medication/co-pay assistance.   1-800-813-HOPE 952-613-8515) ? [place] Assists [place] cancer patients and their families through emotional , educational and financial support.  (614)364-9810 ? [place] DSS Where to apply for food stamps, Medicaid and utility assistance. 505-531-3886 ? [employer]ATS: Transportation to medical appointments. 604-863-3619 ? Social Security Administration: May apply for disability if have a Stage IV cancer. 989-230-4960 209-880-3667 ? [employer], Disability and Transit Services: Assists with nutrition, care and transit needs. [place] Support Programs: @10RELATIVEDAYS @ > Cancer Support Group  2nd Tuesday of the month 1pm-2pm, Journey Room  > Creative Journey  3rd Tuesday of the month 1130am-1pm, Journey Room  > Look Good Feel Better  1st Wednesday of the month 10am-12 noon, Journey Room (Call [place] to register [phone])

## 2017-07-30 NOTE — Progress Notes (Addendum)
Carol Massey, [place] [id number]   CLINIC:  Medical Oncology/Hematology  PCP:  [name], MD [address] [place] [address] [phone]   REASON FOR VISIT:  Follow-up for Iron deficiency anemia secondary to chronic GI blood loss   CURRENT THERAPY: IV iron prn     HISTORY OF PRESENT ILLNESS:  (From [name], PA-C's note on 12/03/16)       INTERVAL HISTORY:  Carol Massey 83 y.o. female returns for follow-up for iron deficiency anemia.   Here today with her husband and her son.  Her son helps care for his parents and helps coordinate their care.  Carol Massey does not hear well, so her son answers many of the questions during our conversation today.    Overall, she tells me she has been feeling fair. Appetite 100%; energy levels 50%.  She has pain on the (R) side of her body to her right arm and right leg. She has numbness to her right face, right arm/hand, and right leg with decreased muscle strength.  She has h/o stroke and TIAs.  She also recently underwent MRI spine, which showed multilevel spondylolisthesis.  She has seen Dr. Cyndy Freeze with [facility] and they are planning to do cervical laminectomy in a few weeks.  Dr. Hewitt Shorts team is requesting surgical clearance from a hematologic standpoint.    Denies any recent active bleeding episodes including blood in her stools, hematuria, vaginal bleeding, nosebleeds, or gingival bleeding.    She continues to see Dr. Wolfgang Phoenix, her PCP, regularly.      REVIEW OF SYSTEMS:  Review of Systems  Constitutional: Positive for fatigue. Negative for chills and fever.  HENT:         Very hard of hearing (hears better in left ear than right)   Eyes: Negative.   Respiratory: Positive for shortness of breath (dyspnea with exertion). Negative for cough.   Cardiovascular: Negative.  Negative for leg swelling.  Gastrointestinal: Negative.  Negative for abdominal pain, blood in stool,  constipation, diarrhea, nausea and vomiting.  Endocrine: Negative.   Genitourinary: Negative.  Negative for hematuria and vaginal bleeding.   Musculoskeletal: Positive for arthralgias and gait problem.  Skin: Negative.  Negative for rash.  Neurological: Positive for dizziness, extremity weakness, gait problem and numbness.  Hematological: Negative.   Psychiatric/Behavioral: Negative.      PAST MEDICAL/SURGICAL HISTORY:  Past Medical History:  Diagnosis Date  . Anemia   . COPD (chronic obstructive pulmonary disease) ([place])   . Dieulafoy lesion of colon 2006  . Gastric AVM 2014  . Generalized anxiety disorder 07/24/2015  . Hard of hearing   . Hypertension   . Iron deficiency anemia due to chronic blood loss 05/27/2014  . Major depression in remission ([place]) 07/24/2015  . Osteoporosis   . PUD (peptic ulcer disease) 11/27/2015   Gastric and duodenal ulcer on EGD  . Stroke ([place])   . Unsteady gait   . Upper GI bleed 11/27/2015   Past Surgical History:  Procedure Laterality Date  . ABDOMINAL HYSTERECTOMY    . APPENDECTOMY    . COLONOSCOPY  03/2005   Dr. Gala Romney: rectal and rectosigmoid polyps removed  . COLONOSCOPY WITH ESOPHAGOGASTRODUODENOSCOPY (EGD) N/A 08/12/2013   Dr. Gala Romney: colonoscopy with tubular adenoma, colonic diverticulosis. EGD with non-critical Schatzki's ring, non-manipulated, and gastric AVM with evidence of recent bleeding, s/p thermal sealing and hemostasis clip  . ESOPHAGEAL DILATION  11/27/2015   Procedure: ESOPHAGEAL DILATION;  Surgeon: Daneil Dolin, MD;  [facility];  Service: Endoscopy;;  . ESOPHAGOGASTRODUODENOSCOPY  03/2005   Dr. Gala Romney: tiny distal esophageal erosions  . ESOPHAGOGASTRODUODENOSCOPY N/A 11/27/2015   RMR: Cricopharyngeus web, status post dilation. Nonbleeding duodenal and gastric ulcers without stigmata of bleeding. Gastric biopsies benign  . ESOPHAGOGASTRODUODENOSCOPY N/A 02/14/2016   Procedure: ESOPHAGOGASTRODUODENOSCOPY (EGD);  Surgeon:  Daneil Dolin, MD;  [facility];  Service: Endoscopy;  Laterality: N/A;  300  . GIVENS CAPSULE STUDY N/A 11/27/2015   NOT DONE, WAS NOT INDICATED AT THE TIME  . MALONEY DILATION N/A 02/14/2016   Procedure: Venia Minks DILATION;  Surgeon: Daneil Dolin, MD;  [facility];  Service: Endoscopy;  Laterality: N/A;  . small bowel capsule study  03/2005   Dr. Laural Golden: two small Dieulafoy lesions at ascending colon. tiney specks of blood at duodenal bulb but no identified lesions  . TONSILLECTOMY       SOCIAL HISTORY:  Social History   Socioeconomic History  . Marital status: Married    Spouse name: Not on file  . Number of children: 3  . Years of education: Not on file  . Highest education level: Not on file  Social Needs  . Financial resource strain: Not hard at all  . Food insecurity - worry: Never true  . Food insecurity - inability: Never true  . Transportation needs - medical: No  . Transportation needs - non-medical: No  Occupational History  . Occupation: retired from [employer]  . Smoking status: Former Smoker    Packs/day: 0.50    Years: 70.00    Pack years: 35.00    Types: Cigarettes    Last attempt to quit: 08/27/2015    Years since quitting: 1.9  . Smokeless tobacco: Never Used  . Tobacco comment: about 1/2 ppd  Substance and Sexual Activity  . Alcohol use: No    Alcohol/week: 0.0 oz  . Drug use: No  . Sexual activity: Not on file    Comment: married  Other Topics Concern  . Not on file  Social History Narrative   Lives with husband and son   Caffeine use: 1 cup a day of coffee   Diet coke- 16oz daily   Right handed    FAMILY HISTORY:  Family History  Problem Relation Age of Onset  . Hypertension Mother   . Stroke Mother   . Leukemia Father 46  . Colon cancer Neg Hx     CURRENT MEDICATIONS:  Outpatient Encounter Medications as of 07/30/2017  Medication Sig  . acetaminophen (TYLENOL) 500 MG tablet Take 500 mg by mouth  every 6 (six) hours as needed.  [name] albuterol (PROVENTIL HFA) 108 (90 Base) MCG/ACT inhaler Inhale 2 puffs into the lungs every 4 (four) hours as needed for wheezing or shortness of breath.  . ALPRAZolam (XANAX) 0.25 MG tablet TAKE 1 TABLET BY MOUTH TWICE A DAY AS NEEDED  . amLODipine (NORVASC) 2.5 MG tablet Take 1 tablet (2.5 mg total) by mouth daily.  . ASCORBIC ACID PO Take 500 mg by mouth every morning.   . Calcium Carb-Cholecalciferol (CALCIUM + D3 PO) Take 1,200 mg by mouth every morning.   . citalopram (CELEXA) 10 MG tablet TAKE 1 TABLET BY MOUTH EVERY DAY (Patient taking differently: takes 1/2 tablet every morning)  . pantoprazole (PROTONIX) 40 MG tablet Take 1 tablet (40 mg total) by mouth daily. For treatment of the ulcer.  . potassium chloride SA (  KLOR-CON M20) 20 MEQ tablet Take 1 tablet (20 mEq total) by mouth 2 (two) times daily.   Facility-Administered Encounter Medications as of 07/30/2017  Medication  . sodium chloride flush (NS) 0.9 % injection 10 mL    ALLERGIES:  No Known Allergies   PHYSICAL EXAM:  ECOG Performance status: 3 - Symptomatic; requires regular assistance   Vitals:   07/30/17 1308  BP: (!) 134/53  Pulse: 65  Resp: 16  Temp: 98.8 F (37.1 C)  SpO2: 96%   Filed Weights   07/30/17 1308  Weight: 106 lb 14.4 oz (48.5 kg)    Physical Exam  Constitutional: She is oriented to person, place, and time.  Elderly female in no acute distress -Physical exam performed with patient in wheelchair d/t right-sided weakness and pt's intermittent dizziness/increased fall risk.   HENT:  Head: Normocephalic.  Mouth/Throat: Oropharynx is clear and moist. No oropharyngeal exudate.  Hard of hearing   Eyes: Conjunctivae are normal. Pupils are equal, round, and reactive to light. No scleral icterus.  Neck: Normal range of motion. Neck supple.  Cardiovascular: Normal rate and regular rhythm.  Pulmonary/Chest: Effort normal and breath sounds normal. No respiratory  distress. She has no wheezes.  Abdominal: Soft. Bowel sounds are normal. There is no tenderness.  Musculoskeletal: She exhibits no edema.  Lymphadenopathy:    She has no cervical adenopathy.       Right: No supraclavicular adenopathy present.       Left: No supraclavicular adenopathy present.  Neurological: She is alert and oriented to person, place, and time. No cranial nerve deficit.  (R) sided weakness   Skin: Skin is warm and dry. No rash noted.  Psychiatric: Mood and affect normal.  Nursing note and vitals reviewed.    LABORATORY DATA:  I have reviewed the labs as listed.  CBC    Component Value Date/Time   WBC 6.2 07/30/2017 1225   RBC 4.34 07/30/2017 1225   HGB 13.6 07/30/2017 1225   HGB 11.2 02/12/2016 1140   HCT 42.8 07/30/2017 1225   HCT 36.5 02/12/2016 1140   PLT 196 07/30/2017 1225   PLT 303 02/12/2016 1140   MCV 98.6 07/30/2017 1225   MCV 87 02/12/2016 1140   MCH 31.3 07/30/2017 1225   MCHC 31.8 07/30/2017 1225   RDW 12.3 07/30/2017 1225   RDW 19.2 (H) 02/12/2016 1140   LYMPHSABS 1.5 07/30/2017 1225   LYMPHSABS 1.7 02/12/2016 1140   MONOABS 0.5 07/30/2017 1225   EOSABS 0.1 07/30/2017 1225   EOSABS 0.1 02/12/2016 1140   BASOSABS 0.0 07/30/2017 1225   BASOSABS 0.1 02/12/2016 1140   CMP Latest Ref Rng & Units 07/30/2017 05/27/2017 03/25/2017  Glucose 65 - 99 mg/dL 129(H) 117(H) 128(H)  BUN 6 - 20 mg/dL 17 13 14   Creatinine 0.44 - 1.00 mg/dL 0.74 0.74 0.76  Sodium 135 - 145 mmol/L 137 136 138  Potassium 3.5 - 5.1 mmol/L 3.8 3.9 3.2(L)  Chloride 101 - 111 mmol/L 102 101 101  CO2 22 - 32 mmol/L 27 27 28   Calcium 8.9 - 10.3 mg/dL 10.2 9.7 10.1  Total Protein 6.5 - 8.1 g/dL - - -  Total Bilirubin 0.3 - 1.2 mg/dL - - -  Alkaline Phos 38 - 126 U/L - - -  AST 15 - 41 U/L - - -  ALT 14 - 54 U/L - - -     PENDING LABS:    DIAGNOSTIC IMAGING:    PATHOLOGY:     ASSESSMENT &  PLAN:   Iron deficiency anemia:  -Thought to be secondary to GI AVMs. Gastric  lesions were seen on prior EGD.  -Hgb 13.6 g/dL today. Clinically, she feels well. Denies any recent bleeding episodes. Iron studies are pending for today. Goal is to keep ferritin >100.  We will contact her with lab results when they are available and make arrangements for IV iron if needed.  -Last IV iron dose was in 03/2017; she tolerated infusion well.  Oncology Flowsheet 04/02/2017  ferumoxytol [facility]) IV 510 mg  -Previous ferritin and other lab work trends reviewed.  Given the stability of her iron deficiency and having only required 1 dose of IV iron in 09/2016 and again 6 months later in 03/2017, I will space her labs and visits out accordingly.  -Labs only in 3 months.  -Return to cancer center in 6 months with labs.  Of course, if she has any new or concerning symptoms associated with her anemia, then we would be happy to see her sooner if needed. She agrees with this plan.   Upcoming cervical laminectomy surgery:  -Scheduled for C3-C7 laminectomy surgery with Dr. Cyndy Freeze on 08/11/17.  -From a hematologic standpoint, she is cleared for surgery.  Her CBC is completely within normal limits today.  We received a faxed form from [facility] requesting surgical clearance. This form has been completed and faxed back to their office, along with today's office visit note.    Hypokalemia:  -Resolved. K normal today at 3.8.   -Managed by her PCP.       Dispo:  -Labs only in 3 months.  -Return to cancer center in 6 months for follow-up with labs.    All questions were answered to patient's stated satisfaction. Encouraged patient to call with any new concerns or questions before her next visit to the cancer center and we can certain see her sooner, if needed.    Plan of care discussed with Dr. Talbert Cage, who agrees with the above aforementioned.    Orders placed this encounter:  Orders Placed This Encounter  Procedures  . CBC with Differential/Platelet  . Ferritin  . Iron and TIBC    . Basic metabolic panel      Mike Craze, NP [place] [phone]

## 2017-07-30 NOTE — Progress Notes (Signed)
Yellow bracelet applied due to patient being a fall risk. Family at side.

## 2017-07-31 ENCOUNTER — Other Ambulatory Visit (HOSPITAL_COMMUNITY): Payer: Self-pay | Admitting: Adult Health

## 2017-08-06 ENCOUNTER — Telehealth: Payer: Self-pay | Admitting: Family Medicine

## 2017-08-06 NOTE — Telephone Encounter (Signed)
Pt's daughter Freida Busman is requesting a call from the Dr. Regarding a surgery that the pt is suppose to have on Monday.

## 2017-08-07 ENCOUNTER — Other Ambulatory Visit: Payer: Self-pay

## 2017-08-07 ENCOUNTER — Encounter (HOSPITAL_COMMUNITY): Payer: Medicare Other | Attending: Oncology

## 2017-08-07 ENCOUNTER — Encounter (HOSPITAL_COMMUNITY): Payer: Self-pay

## 2017-08-07 VITALS — BP 137/53 | HR 63 | Temp 98.3°F | Resp 18

## 2017-08-07 DIAGNOSIS — Q2733 Arteriovenous malformation of digestive system vessel: Secondary | ICD-10-CM | POA: Diagnosis not present

## 2017-08-07 DIAGNOSIS — D508 Other iron deficiency anemias: Secondary | ICD-10-CM | POA: Diagnosis not present

## 2017-08-07 DIAGNOSIS — D500 Iron deficiency anemia secondary to blood loss (chronic): Secondary | ICD-10-CM

## 2017-08-07 MED ORDER — SODIUM CHLORIDE 0.9 % IV SOLN
510.0000 mg | Freq: Once | INTRAVENOUS | Status: AC
  Administered 2017-08-07: 510 mg via INTRAVENOUS
  Filled 2017-08-07: qty 17

## 2017-08-07 MED ORDER — SODIUM CHLORIDE 0.9 % IV SOLN
INTRAVENOUS | Status: DC
  Administered 2017-08-07: 14:00:00 via INTRAVENOUS

## 2017-08-07 NOTE — Progress Notes (Signed)
Tolerated infusion w/o adverse reaction.  Alert, in no distress.  VSS.  Discharged via wheelchair in c/o family.  

## 2017-08-08 ENCOUNTER — Encounter: Payer: Self-pay | Admitting: Cardiovascular Disease

## 2017-08-08 ENCOUNTER — Inpatient Hospital Stay (HOSPITAL_COMMUNITY)
Admission: RE | Admit: 2017-08-08 | Discharge: 2017-08-08 | Disposition: A | Discharge status: Home / Self Care | Payer: Medicare Other | Source: Ambulatory Visit

## 2017-08-08 ENCOUNTER — Encounter (HOSPITAL_COMMUNITY): Payer: Self-pay | Admitting: Vascular Surgery

## 2017-08-08 ENCOUNTER — Ambulatory Visit: Payer: Medicare Other | Admitting: Cardiovascular Disease

## 2017-08-08 VITALS — BP 128/64 | HR 74 | Ht <= 58 in | Wt 108.0 lb

## 2017-08-08 DIAGNOSIS — R0600 Dyspnea, unspecified: Secondary | ICD-10-CM

## 2017-08-08 DIAGNOSIS — R0609 Other forms of dyspnea: Secondary | ICD-10-CM | POA: Diagnosis not present

## 2017-08-08 DIAGNOSIS — R011 Cardiac murmur, unspecified: Secondary | ICD-10-CM

## 2017-08-08 DIAGNOSIS — Z01818 Encounter for other preprocedural examination: Secondary | ICD-10-CM

## 2017-08-08 NOTE — Patient Instructions (Signed)
Your physician recommends that you schedule a follow-up appointment in:  1 month with Dr Bronson Ing   Your physician has requested that you have an echocardiogram. Echocardiography is a painless test that uses sound waves to create images of your heart. It provides your doctor with information about the size and shape of your heart and how well your heart's chambers and valves are working. This procedure takes approximately one hour. There are no restrictions for this procedure.  Your physician has requested that you have a lexiscan myoview. For further information please visit [email or web address]. Please follow instruction sheet, as given.    Your physician recommends that you continue on your current medications as directed. Please refer to the Current Medication list given to you today.   If you need a refill on your cardiac medications before your next appointment, please call your pharmacy.        Thank you for choosing [facility] !

## 2017-08-08 NOTE — Progress Notes (Signed)
CARDIOLOGY CONSULT NOTE  Patient ID: MATAYAH REYBURN MRN: [id number] DOB/AGE: [birth year] 83 y.o.  Admit date: (Not on file) Primary Physician: [name], MD Referring Physician: Dr. Cyndy Freeze  Reason for Consultation: Preoperative risk stratification  HPI: LATOYIA TECSON is a 83 y.o. female who is being seen today for the evaluation of preoperative risk stratification at the request of Dr. Cyndy Freeze.  She is being scheduled to undergo C3-C7 laminectomy with posterior segmental instrumented fusion.  Past medical history includes hypertension and gastroesophageal reflux disease.  She has iron deficiency anemia from chronic GI blood loss due to AV malformations.  She has no known history of coronary artery disease.  Most recent nuclear stress test was normal and performed on 04/10/2005.  Carotid Dopplers 03/26/17 demonstrated mild bilateral carotid artery stenosis, 1-39%.  ECG on 01/17/17 which I personally interpreted demonstrated sinus rhythm with no ischemic ST segment or T wave abnormalities, nor any arrhythmias.  She apparently has a history of COPD and TIAs.  She said she gets short of breath when changing clothes.  She walks with a walker.  She complains of drooling out of the right side of her mouth and an inability to close her right eye.  She also complains of right hand and arm shaking.  She is here with her son and her husband.  No Known Allergies  Current Outpatient Medications  Medication Sig Dispense Refill  . acetaminophen (TYLENOL) 500 MG tablet Take 500 mg by mouth daily.     [name] albuterol (PROVENTIL HFA) 108 (90 Base) MCG/ACT inhaler Inhale 2 puffs into the lungs every 4 (four) hours as needed for wheezing or shortness of breath. 20.1 Inhaler 4  . ALPRAZolam (XANAX) 0.25 MG tablet TAKE 1 TABLET BY MOUTH TWICE A DAY AS NEEDED (Patient taking differently: TAKE 0.125 MG BY MOUTH TWICE A DAY AS NEEDED FOR ANXIETY) 45 tablet 3  . amLODipine (NORVASC) 2.5 MG tablet Take 1  tablet (2.5 mg total) by mouth daily. 90 tablet 0  . Ascorbic Acid (VITAMIN C PLUS WILD ROSE HIPS PO) Take 1 tablet by mouth daily.    . Calcium Carb-Cholecalciferol (CALCIUM + D3 PO) Take 1 tablet by mouth daily.     . citalopram (CELEXA) 10 MG tablet TAKE 1 TABLET BY MOUTH EVERY DAY (Patient taking differently: Take 10 mg by mouth every day) 90 tablet 4  . pantoprazole (PROTONIX) 40 MG tablet Take 1 tablet (40 mg total) by mouth daily. For treatment of the ulcer. 30 tablet 5  . potassium chloride SA (KLOR-CON M20) 20 MEQ tablet Take 1 tablet (20 mEq total) by mouth 2 (two) times daily. (Patient taking differently: Take 20 mEq by mouth daily. ) 30 tablet 5   No current facility-administered medications for this visit.    Facility-Administered Medications Ordered in Other Visits  Medication Dose Route Frequency Provider Last Rate Last Dose  . sodium chloride flush (NS) 0.9 % injection 10 mL  10 mL Intracatheter PRN [facility], PA-C        Past Medical History:  Diagnosis Date  . Anemia   . COPD (chronic obstructive pulmonary disease) ([place])   . Dieulafoy lesion of colon 2006  . Gastric AVM 2014  . Generalized anxiety disorder 07/24/2015  . Hard of hearing   . Hypertension   . Iron deficiency anemia due to chronic blood loss 05/27/2014  . Major depression in remission ([place]) 07/24/2015  . Osteoporosis   . PUD (peptic  ulcer disease) 11/27/2015   Gastric and duodenal ulcer on EGD  . Stroke ([place])   . Unsteady gait   . Upper GI bleed 11/27/2015    Past Surgical History:  Procedure Laterality Date  . ABDOMINAL HYSTERECTOMY    . APPENDECTOMY    . COLONOSCOPY  03/2005   Dr. Gala Romney: rectal and rectosigmoid polyps removed  . COLONOSCOPY WITH ESOPHAGOGASTRODUODENOSCOPY (EGD) N/A 08/12/2013   Dr. Gala Romney: colonoscopy with tubular adenoma, colonic diverticulosis. EGD with non-critical Schatzki's ring, non-manipulated, and gastric AVM with evidence of recent bleeding, s/p thermal sealing and  hemostasis clip  . ESOPHAGEAL DILATION  11/27/2015   Procedure: ESOPHAGEAL DILATION;  Surgeon: Daneil Dolin, MD;  [facility];  Service: Endoscopy;;  . ESOPHAGOGASTRODUODENOSCOPY  03/2005   Dr. Gala Romney: tiny distal esophageal erosions  . ESOPHAGOGASTRODUODENOSCOPY N/A 11/27/2015   RMR: Cricopharyngeus web, status post dilation. Nonbleeding duodenal and gastric ulcers without stigmata of bleeding. Gastric biopsies benign  . ESOPHAGOGASTRODUODENOSCOPY N/A 02/14/2016   Procedure: ESOPHAGOGASTRODUODENOSCOPY (EGD);  Surgeon: Daneil Dolin, MD;  [facility];  Service: Endoscopy;  Laterality: N/A;  300  . GIVENS CAPSULE STUDY N/A 11/27/2015   NOT DONE, WAS NOT INDICATED AT THE TIME  . MALONEY DILATION N/A 02/14/2016   Procedure: Venia Minks DILATION;  Surgeon: Daneil Dolin, MD;  [facility];  Service: Endoscopy;  Laterality: N/A;  . small bowel capsule study  03/2005   Dr. Laural Golden: two small Dieulafoy lesions at ascending colon. tiney specks of blood at duodenal bulb but no identified lesions  . TONSILLECTOMY      Social History   Socioeconomic History  . Marital status: Married    Spouse name: Not on file  . Number of children: 3  . Years of education: Not on file  . Highest education level: Not on file  Social Needs  . Financial resource strain: Not hard at all  . Food insecurity - worry: Never true  . Food insecurity - inability: Never true  . Transportation needs - medical: No  . Transportation needs - non-medical: No  Occupational History  . Occupation: retired from [employer]  . Smoking status: Former Smoker    Packs/day: 0.50    Years: 70.00    Pack years: 35.00    Types: Cigarettes    Last attempt to quit: 08/26/2014    Years since quitting: 2.9  . Smokeless tobacco: Never Used  . Tobacco comment: about 1/2 ppd  Substance and Sexual Activity  . Alcohol use: No    Alcohol/week: 0.0 oz  . Drug use: No  . Sexual activity: Not on file     Comment: married  Other Topics Concern  . Not on file  Social History Narrative   Lives with husband and son   Caffeine use: 1 cup a day of coffee   Diet coke- 16oz daily   Right handed     No family history of premature CAD in 1st degree relatives.  Current Meds  Medication Sig  . acetaminophen (TYLENOL) 500 MG tablet Take 500 mg by mouth daily.   [name] albuterol (PROVENTIL HFA) 108 (90 Base) MCG/ACT inhaler Inhale 2 puffs into the lungs every 4 (four) hours as needed for wheezing or shortness of breath.  . ALPRAZolam (XANAX) 0.25 MG tablet TAKE 1 TABLET BY MOUTH TWICE A DAY AS NEEDED (Patient taking differently: TAKE 0.125 MG BY MOUTH TWICE A DAY AS NEEDED FOR ANXIETY)  . amLODipine (NORVASC) 2.5  MG tablet Take 1 tablet (2.5 mg total) by mouth daily.  . Ascorbic Acid (VITAMIN C PLUS WILD ROSE HIPS PO) Take 1 tablet by mouth daily.  . Calcium Carb-Cholecalciferol (CALCIUM + D3 PO) Take 1 tablet by mouth daily.   . citalopram (CELEXA) 10 MG tablet TAKE 1 TABLET BY MOUTH EVERY DAY (Patient taking differently: Take 10 mg by mouth every day)  . pantoprazole (PROTONIX) 40 MG tablet Take 1 tablet (40 mg total) by mouth daily. For treatment of the ulcer.  . potassium chloride SA (KLOR-CON M20) 20 MEQ tablet Take 1 tablet (20 mEq total) by mouth 2 (two) times daily. (Patient taking differently: Take 20 mEq by mouth daily. )      Review of systems complete and found to be negative unless listed above in HPI    Physical exam Blood pressure 128/64, pulse 74, height 4\' 10"  (1.473 m), weight 108 lb (49 kg), SpO2 92 %. General: NAD Neck: No JVD, no thyromegaly or thyroid nodule.  Lungs: Clear to auscultation bilaterally with normal respiratory effort. CV: Nondisplaced PMI. Regular rate and rhythm, normal S1/S2, no Z6/X0, 2/6 systolic murmur loudest over right upper sternal border.  No peripheral edema.      Abdomen: Soft, nontender, no distention.  Skin: Intact without lesions or rashes.    Neurologic: Alert and oriented x 3.  Psych: Normal affect. Extremities: No clubbing or cyanosis.  HEENT: Normal.   ECG: Most recent ECG reviewed.   Labs: Lab Results  Component Value Date/Time   K 3.8 07/30/2017 12:25 PM   BUN 17 07/30/2017 12:25 PM   BUN 10 05/22/2015 09:48 AM   CREATININE 0.74 07/30/2017 12:25 PM   CREATININE 0.60 06/17/2013 12:41 PM   ALT 11 (L) 01/17/2017 04:37 PM   HGB 13.6 07/30/2017 12:25 PM   HGB 11.2 02/12/2016 11:40 AM     Lipids: Lab Results  Component Value Date/Time   LDLCALC 79 12/03/2014 08:05 AM   CHOL 183 12/03/2014 08:05 AM   TRIG 81 12/03/2014 08:05 AM   HDL 88 12/03/2014 08:05 AM        ASSESSMENT AND PLAN:   1.  Preoperative risk stratification: She is at a  low risk for perioperative myocardial infarction or cardiac arrest using both the geriatric sensitive perioperative cardiac risk index and the revised cardiac risk index.  That being said, I am unable to assess her functional capacity and she has a history of cerebrovascular disease and COPD.  I think it would be prudent to assess any potential ischemic heart disease with a Lexiscan Myoview stress test.  2.  Cardiac murmur: I will obtain an echocardiogram to assess cardiac structure and function.  3.  Hypertension: Controlled.  No change to therapy.    Disposition: Follow up in 1 month  Signed: Kate Sable, M.D., F.A.C.C.  08/08/2017, 10:33 AM

## 2017-08-08 NOTE — Pre-Procedure Instructions (Signed)
Carol Massey  08/08/2017      [id number] - [place], [place] [address] [place] [address] Phone: [phone] Fax: [phone]    Your procedure is scheduled on Monday, 08/11/2017.  Report to [facility] Admitting at [place].M.  Call this number if you have problems the morning of surgery:  [phone]   Remember:   Continue all medications as directed by your physician except follow these medication instructions before surgery below   Do not eat food or drink liquids after midnight.   Take these medicines the morning of surgery with A SIP OF WATER: Acetaminophen (Tylenol) - if needed Albuterol inhlaer - if needed (Bring with you to the hospital the day of surgery) Alprazolam (xanax) - if needed Amlodipine (Norvasc) Citalopram (Celexa) Pantoprazole (Protonix)  7 days prior to surgery STOP taking any Aspirin(unless otherwise instructed by your surgeon), Aleve, Naproxen, Ibuprofen, Motrin, Advil, Goody's, BC's, all herbal medications, fish oil, and all vitamins    Do not wear jewelry, make-up or nail polish.  Do not wear lotions, powders, or perfumes, or deodorant.  Do not shave 48 hours prior to surgery.    Do not bring valuables to the hospital.  [facility] is not responsible for any belongings or valuables.  Contacts, eyeglasses, dentures or bridgework, hearing aids may not be worn into surgery.  Leave your suitcase in the car.  After surgery it may be brought to your room.  For patients admitted to the hospital, discharge time will be determined by your treatment team.  Patients discharged the day of surgery will not be allowed to drive home.   Name and phone number of your driver:    Special instructions:   [facility]- Preparing For Surgery  Before surgery, you can play an important role. Because skin is not sterile, your skin needs to be as free of germs as possible. You can reduce the number of  germs on your skin by washing with CHG (chlorahexidine gluconate) Soap before surgery.  CHG is an antiseptic cleaner which kills germs and bonds with the skin to continue killing germs even after washing.  Please do not use if you have an allergy to CHG or antibacterial soaps. If your skin becomes reddened/irritated stop using the CHG.  Do not shave (including legs and underarms) for at least 48 hours prior to first CHG shower. It is OK to shave your face.  Please follow these instructions carefully.   1. Shower the NIGHT BEFORE SURGERY and the MORNING OF SURGERY with CHG.   2. If you chose to wash your hair, wash your hair first as usual with your normal shampoo.  3. After you shampoo, rinse your hair and body thoroughly to remove the shampoo.  4. Use CHG as you would any other liquid soap. You can apply CHG directly to the skin and wash gently with a scrungie or a clean washcloth.   5. Apply the CHG Soap to your body ONLY FROM THE NECK DOWN.  Do not use on open wounds or open sores. Avoid contact with your eyes, ears, mouth and genitals (private parts). Wash Face and genitals (private parts)  with your normal soap.  6. Wash thoroughly, paying special attention to the area where your surgery will be performed.  7. Thoroughly rinse your body with warm water from the neck down.  8. DO NOT shower/wash with your normal soap after using and rinsing off  the CHG Soap.  9. Pat yourself dry with a CLEAN TOWEL.  10. Wear CLEAN PAJAMAS to bed the night before surgery, wear comfortable clothes the morning of surgery  11. Place CLEAN SHEETS on your bed the night of your first shower and DO NOT SLEEP WITH PETS.    Day of Surgery: Shower as stated above. Do not apply any deodorants/lotions. Please wear clean clothes to the hospital/surgery center.      Please read over the following fact sheets that you were given.

## 2017-08-09 NOTE — Telephone Encounter (Signed)
I discussed this case with the daughter as well as the son they are delaying surgery at this time.  They will be doing the full cardiology workup then they will discuss with [place] the possibility of not doing the surgery versus getting a second opinion before progressing forward-they will let us know once cardiology evaluation is finished

## 2017-08-11 ENCOUNTER — Inpatient Hospital Stay (HOSPITAL_COMMUNITY): Admission: RE | Admit: 2017-08-11 | Payer: Medicare Other | Source: Ambulatory Visit | Admitting: Neurological Surgery

## 2017-08-11 ENCOUNTER — Encounter (HOSPITAL_COMMUNITY): Admission: RE | Payer: Self-pay | Source: Ambulatory Visit

## 2017-08-11 SURGERY — POSTERIOR CERVICAL FUSION/FORAMINOTOMY LEVEL 4
Anesthesia: General

## 2017-08-20 ENCOUNTER — Other Ambulatory Visit: Payer: Self-pay

## 2017-08-20 MED ORDER — PANTOPRAZOLE SODIUM 40 MG PO TBEC: 40.0000 mg | DELAYED_RELEASE_TABLET | Freq: Every day | ORAL | 5 refills | Status: DC

## 2017-08-22 ENCOUNTER — Other Ambulatory Visit (HOSPITAL_COMMUNITY): Payer: Medicare Other

## 2017-08-22 ENCOUNTER — Encounter (HOSPITAL_COMMUNITY): Payer: Medicare Other

## 2017-08-22 ENCOUNTER — Ambulatory Visit (HOSPITAL_COMMUNITY): Payer: Medicare Other

## 2017-08-25 ENCOUNTER — Other Ambulatory Visit: Payer: Self-pay | Admitting: *Deleted

## 2017-08-25 MED ORDER — AMLODIPINE BESYLATE 2.5 MG PO TABS: 2.5000 mg | ORAL_TABLET | Freq: Every day | ORAL | 0 refills | Status: DC

## 2017-09-19 ENCOUNTER — Ambulatory Visit: Payer: Medicare Other | Admitting: Cardiovascular Disease

## 2017-09-24 ENCOUNTER — Ambulatory Visit: Payer: Medicare Other | Admitting: Family Medicine

## 2017-09-25 ENCOUNTER — Ambulatory Visit: Payer: Medicare Other | Admitting: Family Medicine

## 2017-09-25 VITALS — BP 132/90 | Ht <= 58 in | Wt 107.0 lb

## 2017-09-25 DIAGNOSIS — I10 Essential (primary) hypertension: Secondary | ICD-10-CM

## 2017-09-25 DIAGNOSIS — R54 Age-related physical debility: Secondary | ICD-10-CM | POA: Diagnosis not present

## 2017-09-25 DIAGNOSIS — J438 Other emphysema: Secondary | ICD-10-CM

## 2017-09-25 MED ORDER — BUDESONIDE-FORMOTEROL FUMARATE 80-4.5 MCG/ACT IN AERO: 2.0000 | INHALATION_SPRAY | Freq: Two times a day (BID) | RESPIRATORY_TRACT | 3 refills | Status: DC

## 2017-09-25 NOTE — Progress Notes (Signed)
   Subjective:    Patient ID: Carol Massey, female    DOB: [birth year], 84 y.o.   MRN: [id number]  Hypertension  This is a chronic problem. The current episode started more than 1 year ago. Associated symptoms include shortness of breath. Pertinent negatives include no chest pain or headaches.   Pt states she decided she was not going to neck surgery.  She still has right arm pain and right arm weakness related to the nerve impingement it does cause some difficulty in day-to-day living  She does have some shortness of breath with activity long history of smoking she does have COPD she denies coughing but does get short of breath.  Review of Systems  Constitutional: Negative for activity change, fatigue and fever.  HENT: Negative for congestion.   Respiratory: Positive for shortness of breath. Negative for cough and chest tightness.   Cardiovascular: Negative for chest pain and leg swelling.  Gastrointestinal: Negative for abdominal pain.  Skin: Negative for color change.  Neurological: Negative for headaches.  Psychiatric/Behavioral: Negative for behavioral problems.       Objective:   Physical Exam  Constitutional: She appears well-developed and well-nourished. No distress.  HENT:  Head: Normocephalic and atraumatic.  Eyes: Right eye exhibits no discharge. Left eye exhibits no discharge.  Neck: No tracheal deviation present.  Cardiovascular: Normal rate, regular rhythm and normal heart sounds.  No murmur heard. Pulmonary/Chest: Effort normal and breath sounds normal. No respiratory distress. She has no wheezes. She has no rales.  Musculoskeletal: She exhibits no edema.  Lymphadenopathy:    She has no cervical adenopathy.  Neurological: She is alert. She exhibits normal muscle tone.  Skin: Skin is warm and dry. No erythema.  Psychiatric: Her behavior is normal.  Vitals reviewed.         Assessment & Plan:  Emphysema patient would like to try Symbicort to see how this  does  Blood pressure good control continue current measures  Frailty-avoid falls  Follow-up in 4 months

## 2017-10-09 ENCOUNTER — Other Ambulatory Visit: Payer: Self-pay | Admitting: *Deleted

## 2017-10-09 DIAGNOSIS — D500 Iron deficiency anemia secondary to blood loss (chronic): Secondary | ICD-10-CM

## 2017-10-09 DIAGNOSIS — E876 Hypokalemia: Secondary | ICD-10-CM

## 2017-10-09 MED ORDER — POTASSIUM CHLORIDE CRYS ER 20 MEQ PO TBCR: 20.0000 meq | EXTENDED_RELEASE_TABLET | Freq: Two times a day (BID) | ORAL | 1 refills | Status: DC

## 2017-10-28 ENCOUNTER — Inpatient Hospital Stay (HOSPITAL_COMMUNITY): Payer: Medicare Other | Attending: Internal Medicine

## 2017-10-28 DIAGNOSIS — Q2733 Arteriovenous malformation of digestive system vessel: Secondary | ICD-10-CM | POA: Insufficient documentation

## 2017-10-28 DIAGNOSIS — D500 Iron deficiency anemia secondary to blood loss (chronic): Secondary | ICD-10-CM | POA: Diagnosis present

## 2017-10-28 LAB — FERRITIN: FERRITIN: 237 ng/mL (ref 11–307)

## 2017-10-28 LAB — BASIC METABOLIC PANEL
Anion gap: 10 (ref 5–15)
BUN: 16 mg/dL (ref 6–20)
CALCIUM: 10.2 mg/dL (ref 8.9–10.3)
CO2: 27 mmol/L (ref 22–32)
Chloride: 102 mmol/L (ref 101–111)
Creatinine, Ser: 0.9 mg/dL (ref 0.44–1.00)
GFR calc non Af Amer: 55 mL/min — ABNORMAL LOW (ref >60)
Glucose, Bld: 110 mg/dL — ABNORMAL HIGH (ref 65–99)
Potassium: 3.8 mmol/L (ref 3.5–5.1)
SODIUM: 139 mmol/L (ref 135–145)

## 2017-10-28 LAB — IRON AND TIBC
Iron: 49 ug/dL (ref 28–170)
SATURATION RATIOS: 16 % (ref 10.4–31.8)
TIBC: 301 ug/dL (ref 250–450)
UIBC: 252 ug/dL

## 2017-10-28 LAB — CBC WITH DIFFERENTIAL/PLATELET
BASOS PCT: 1 %
Basophils Absolute: 0 10*3/uL (ref 0.0–0.1)
EOS PCT: 2 %
Eosinophils Absolute: 0.1 10*3/uL (ref 0.0–0.7)
HCT: 43.1 % (ref 36.0–46.0)
HEMOGLOBIN: 13.8 g/dL (ref 12.0–15.0)
Lymphocytes Relative: 20 %
Lymphs Abs: 1.2 10*3/uL (ref 0.7–4.0)
MCH: 31.2 pg (ref 26.0–34.0)
MCHC: 32 g/dL (ref 30.0–36.0)
MCV: 97.5 fL (ref 78.0–100.0)
MONOS PCT: 9 %
Monocytes Absolute: 0.5 10*3/uL (ref 0.1–1.0)
NEUTROS PCT: 70 %
Neutro Abs: 4.2 10*3/uL (ref 1.7–7.7)
PLATELETS: 221 10*3/uL (ref 150–400)
RBC: 4.42 MIL/uL (ref 3.87–5.11)
RDW: 13.3 % (ref 11.5–15.5)
WBC: 6 10*3/uL (ref 4.0–10.5)

## 2017-11-17 ENCOUNTER — Other Ambulatory Visit: Payer: Self-pay

## 2017-11-17 MED ORDER — AMLODIPINE BESYLATE 2.5 MG PO TABS: 2.5000 mg | ORAL_TABLET | Freq: Every day | ORAL | 0 refills | Status: DC

## 2017-11-25 ENCOUNTER — Other Ambulatory Visit: Payer: Self-pay | Admitting: Family Medicine

## 2017-12-12 ENCOUNTER — Other Ambulatory Visit: Payer: Self-pay | Admitting: Family Medicine

## 2017-12-15 NOTE — Telephone Encounter (Signed)
Ok times one with one ref 

## 2017-12-16 ENCOUNTER — Other Ambulatory Visit: Payer: Self-pay | Admitting: Family Medicine

## 2017-12-28 ENCOUNTER — Telehealth: Payer: Self-pay | Admitting: Family Medicine

## 2017-12-28 ENCOUNTER — Other Ambulatory Visit: Payer: Self-pay | Admitting: Family Medicine

## 2017-12-28 NOTE — Telephone Encounter (Signed)
Please let the son-Michael-be aware I do not request any additional lab work before office visit later this month I reviewed over her chart she had recent lab work earlier this spring

## 2017-12-29 NOTE — Telephone Encounter (Signed)
Pt son [name] informed and verbalized understanding

## 2018-01-21 ENCOUNTER — Encounter: Payer: Self-pay | Admitting: Family Medicine

## 2018-01-21 ENCOUNTER — Ambulatory Visit: Payer: Medicare Other | Admitting: Family Medicine

## 2018-01-21 VITALS — BP 100/64 | Ht <= 58 in | Wt 107.0 lb

## 2018-01-21 DIAGNOSIS — D638 Anemia in other chronic diseases classified elsewhere: Secondary | ICD-10-CM | POA: Diagnosis not present

## 2018-01-21 DIAGNOSIS — J438 Other emphysema: Secondary | ICD-10-CM | POA: Diagnosis not present

## 2018-01-21 DIAGNOSIS — R5383 Other fatigue: Secondary | ICD-10-CM | POA: Diagnosis not present

## 2018-01-21 HISTORY — DX: Anemia in other chronic diseases classified elsewhere: D63.8

## 2018-01-21 LAB — POCT HEMOGLOBIN: Hemoglobin: 12.3 g/dL (ref 12.2–16.2)

## 2018-01-21 NOTE — Progress Notes (Signed)
   Subjective:    Patient ID: Carol Massey, female    DOB: 06-08-1926, 84 y.o.   MRN: [id number]  HPI Patient is here today to follow up on her chronic illnesses.She is currently on Norvasc 2.5 daily. Eats healthy and exercises. Per son she has been a little lethargic lately and would like to know what hgb level is.  Patient is doing well.  Trying to be healthy.  She is advanced in age.  Patient recently had a birthday. Review of Systems Currently the patient denies any major issues no chest tightness pressure pain shortness breath nausea vomiting diarrhea has history of anemia    Results for orders placed or performed in visit on 01/21/18  POCT hemoglobin  Result Value Ref Range   Hemoglobin 12.3 12.2 - 16.2 g/dL    Objective:   Physical Exam  Lungs clear respiratory rate normal heart regular no murmurs extremities no edema skin warm dry neurologic grossly normal patient able to walk with a walker well without falling Patient COPD stable currently Anemia chronic disease stable    Assessment & Plan:  Anemia stable continue current measures  Frailty mild cachexia encourage nutritional intake patient at increased risk of falling uses walker  HTN good control follow-up in 4 months

## 2018-01-27 ENCOUNTER — Inpatient Hospital Stay (HOSPITAL_COMMUNITY): Payer: Medicare Other | Attending: Hematology

## 2018-01-27 DIAGNOSIS — D500 Iron deficiency anemia secondary to blood loss (chronic): Secondary | ICD-10-CM | POA: Insufficient documentation

## 2018-01-27 DIAGNOSIS — Q2733 Arteriovenous malformation of digestive system vessel: Secondary | ICD-10-CM | POA: Insufficient documentation

## 2018-01-27 LAB — CBC WITH DIFFERENTIAL/PLATELET
Basophils Absolute: 0 10*3/uL (ref 0.0–0.1)
Basophils Relative: 1 %
EOS ABS: 0.2 10*3/uL (ref 0.0–0.7)
Eosinophils Relative: 3 %
HCT: 40.6 % (ref 36.0–46.0)
HEMOGLOBIN: 12.8 g/dL (ref 12.0–15.0)
LYMPHS ABS: 1.3 10*3/uL (ref 0.7–4.0)
LYMPHS PCT: 24 %
MCH: 30.5 pg (ref 26.0–34.0)
MCHC: 31.5 g/dL (ref 30.0–36.0)
MCV: 96.7 fL (ref 78.0–100.0)
Monocytes Absolute: 0.5 10*3/uL (ref 0.1–1.0)
Monocytes Relative: 9 %
NEUTROS PCT: 63 %
Neutro Abs: 3.6 10*3/uL (ref 1.7–7.7)
Platelets: 241 10*3/uL (ref 150–400)
RBC: 4.2 MIL/uL (ref 3.87–5.11)
RDW: 12.9 % (ref 11.5–15.5)
WBC: 5.7 10*3/uL (ref 4.0–10.5)

## 2018-01-27 LAB — IRON AND TIBC
Iron: 35 ug/dL (ref 28–170)
SATURATION RATIOS: 11 % (ref 10.4–31.8)
TIBC: 318 ug/dL (ref 250–450)
UIBC: 283 ug/dL

## 2018-01-27 LAB — BASIC METABOLIC PANEL
Anion gap: 8 (ref 5–15)
BUN: 16 mg/dL (ref 6–20)
CALCIUM: 10.2 mg/dL (ref 8.9–10.3)
CO2: 25 mmol/L (ref 22–32)
CREATININE: 0.79 mg/dL (ref 0.44–1.00)
Chloride: 105 mmol/L (ref 101–111)
GFR calc Af Amer: >60 mL/min (ref >60)
GLUCOSE: 120 mg/dL — AB (ref 65–99)
Potassium: 3.8 mmol/L (ref 3.5–5.1)
Sodium: 138 mmol/L (ref 135–145)

## 2018-01-27 LAB — FERRITIN: FERRITIN: 48 ng/mL (ref 11–307)

## 2018-01-28 ENCOUNTER — Inpatient Hospital Stay (HOSPITAL_BASED_OUTPATIENT_CLINIC_OR_DEPARTMENT_OTHER): Payer: Medicare Other | Admitting: Hematology

## 2018-01-28 ENCOUNTER — Other Ambulatory Visit (HOSPITAL_COMMUNITY): Payer: Medicare Other

## 2018-01-28 ENCOUNTER — Encounter (HOSPITAL_COMMUNITY): Payer: Self-pay | Admitting: Hematology

## 2018-01-28 ENCOUNTER — Other Ambulatory Visit: Payer: Self-pay

## 2018-01-28 ENCOUNTER — Telehealth: Payer: Self-pay | Admitting: Family Medicine

## 2018-01-28 VITALS — BP 116/77 | HR 66 | Temp 98.3°F | Resp 16

## 2018-01-28 DIAGNOSIS — Q2733 Arteriovenous malformation of digestive system vessel: Secondary | ICD-10-CM | POA: Diagnosis not present

## 2018-01-28 DIAGNOSIS — D500 Iron deficiency anemia secondary to blood loss (chronic): Secondary | ICD-10-CM | POA: Diagnosis not present

## 2018-01-28 NOTE — Progress Notes (Signed)
[address] [place], [place] [address]   CLINIC:  Medical Oncology/Hematology  PCP:  [name], MD [address] [place] [address] [phone]   REASON FOR VISIT:  Follow-up for iron deficiency anemia from GI blood loss.  CURRENT THERAPY: Intermittent Feraheme infusions.    INTERVAL HISTORY:  Carol Massey 85 y.o. female returns for follow-up of iron deficiency anemia.  She is accompanied by her husband and son.  According to the son she is sleeping a lot.  She denies any bleeding per rectum but has dark stools.  Patient is hard at hearing.  Denies any fevers or night sweats.  Reported that she had some numbness in the hand after last Feraheme infusion.  However she also reports some numbness in her right side of her body from cervical spine issues.  She decided not to have laminectomy done.  Denies any recent hospitalizations.   REVIEW OF SYSTEMS:  Review of Systems  Constitutional: Positive for fatigue.  Neurological: Positive for numbness.  All other systems reviewed and are negative.    PAST MEDICAL/SURGICAL HISTORY:  Past Medical History:  Diagnosis Date  . Anemia   . Anemia, chronic disease 01/21/2018  . COPD (chronic obstructive pulmonary disease) ([place])   . Dieulafoy lesion of colon 2006  . Gastric AVM 2014  . Generalized anxiety disorder 07/24/2015  . Hard of hearing   . Hypertension   . Iron deficiency anemia due to chronic blood loss 05/27/2014  . Major depression in remission ([place]) 07/24/2015  . Osteoporosis   . PUD (peptic ulcer disease) 11/27/2015   Gastric and duodenal ulcer on EGD  . Stroke ([place])   . Unsteady gait   . Upper GI bleed 11/27/2015   Past Surgical History:  Procedure Laterality Date  . ABDOMINAL HYSTERECTOMY    . APPENDECTOMY    . COLONOSCOPY  03/2005   Dr. Gala Romney: rectal and rectosigmoid polyps removed  . COLONOSCOPY WITH ESOPHAGOGASTRODUODENOSCOPY (EGD) N/A 08/12/2013   Dr. Gala Romney: colonoscopy  with tubular adenoma, colonic diverticulosis. EGD with non-critical Schatzki's ring, non-manipulated, and gastric AVM with evidence of recent bleeding, s/p thermal sealing and hemostasis clip  . ESOPHAGEAL DILATION  11/27/2015   Procedure: ESOPHAGEAL DILATION;  Surgeon: Daneil Dolin, MD;  [facility];  Service: Endoscopy;;  . ESOPHAGOGASTRODUODENOSCOPY  03/2005   Dr. Gala Romney: tiny distal esophageal erosions  . ESOPHAGOGASTRODUODENOSCOPY N/A 11/27/2015   RMR: Cricopharyngeus web, status post dilation. Nonbleeding duodenal and gastric ulcers without stigmata of bleeding. Gastric biopsies benign  . ESOPHAGOGASTRODUODENOSCOPY N/A 02/14/2016   Procedure: ESOPHAGOGASTRODUODENOSCOPY (EGD);  Surgeon: Daneil Dolin, MD;  Location[facility];  Service: Endoscopy;  Laterality: N/A;  300  . GIVENS CAPSULE STUDY N/A 11/27/2015   NOT DONE, WAS NOT INDICATED AT THE TIME  . MALONEY DILATION N/A 02/14/2016   Procedure: Venia Minks DILATION;  Surgeon: Daneil Dolin, MD;  Location: [facility];  Service: Endoscopy;  Laterality: N/A;  . small bowel capsule study  03/2005   Dr. Laural Golden: two small Dieulafoy lesions at ascending colon. tiney specks of blood at duodenal bulb but no identified lesions  . TONSILLECTOMY       SOCIAL HISTORY:  Social History   Socioeconomic History  . Marital status: Married    Spouse name: Not on file  . Number of children: 3  . Years of education: Not on file  . Highest education level: Not on file  Occupational History  .  Occupation: retired from [employer]  . Financial resource strain: Not hard at all  . Food insecurity:    Worry: Never true    Inability: Never true  . Transportation needs:    Medical: No    Non-medical: No  Tobacco Use  . Smoking status: Former Smoker    Packs/day: 0.50    Years: 70.00    Pack years: 35.00    Types: Cigarettes    Last attempt to quit: 08/26/2014    Years since quitting: 3.4  . Smokeless tobacco: Never Used    . Tobacco comment: about 1/2 ppd  Substance and Sexual Activity  . Alcohol use: No    Alcohol/week: 0.0 oz  . Drug use: No  . Sexual activity: Not on file    Comment: married  Lifestyle  . Physical activity:    Days per week: 0 days    Minutes per session: 0 min  . Stress: Not at all  Relationships  . Social connections:    Talks on phone: More than three times a week    Gets together: More than three times a week    Attends religious service: More than 4 times per year    Active member of club or organization: Yes    Attends meetings of clubs or organizations: 1 to 4 times per year    Relationship status: Married  . Intimate partner violence:    Fear of current or ex partner: No    Emotionally abused: No    Physically abused: No    Forced sexual activity: No  Other Topics Concern  . Not on file  Social History Narrative   Lives with husband and son   Caffeine use: 1 cup a day of coffee   Diet coke- 16oz daily   Right handed    FAMILY HISTORY:  Family History  Problem Relation Age of Onset  . Hypertension Mother   . Stroke Mother   . Leukemia Father 72  . Colon cancer Neg Hx     CURRENT MEDICATIONS:  Outpatient Encounter Medications as of 01/28/2018  Medication Sig  . acetaminophen (TYLENOL) 500 MG tablet Take 500 mg by mouth daily.   [name] albuterol (PROVENTIL HFA;VENTOLIN HFA) 108 (90 Base) MCG/ACT inhaler INHALE 2 PUFFS INTO THE LUNGS EVERY 4 HOURS AS NEEDED FOR WHEEZING OR SHORTNESS OF BREATH.  [name] ALPRAZolam (XANAX) 0.25 MG tablet TAKE 0.125 MG BY MOUTH TWICE A DAY AS NEEDED FOR ANXIETY  . amLODipine (NORVASC) 2.5 MG tablet Take 1 tablet (2.5 mg total) by mouth daily.  . Ascorbic Acid (VITAMIN C PLUS WILD ROSE HIPS PO) Take 1 tablet by mouth daily.  . Calcium Carb-Cholecalciferol (CALCIUM + D3 PO) Take 1 tablet by mouth daily.   . citalopram (CELEXA) 10 MG tablet Take 10 mg by mouth every day  . pantoprazole (PROTONIX) 40 MG tablet Take 1 tablet (40 mg total) by  mouth daily. For treatment of the ulcer.  . potassium chloride SA (KLOR-CON M20) 20 MEQ tablet Take 1 tablet (20 mEq total) by mouth 2 (two) times daily.  . SYMBICORT 80-4.5 MCG/ACT inhaler TAKE 2 PUFFS BY MOUTH TWICE A DAY   Facility-Administered Encounter Medications as of 01/28/2018  Medication  . sodium chloride flush (NS) 0.9 % injection 10 mL    ALLERGIES:  No Known Allergies   PHYSICAL EXAM:  ECOG Performance status: 2  Vitals:   01/28/18 1420  BP: 116/77  Pulse: 66  Resp: 16  Temp: 98.3 F (36.8 C)  SpO2: 96%   There were no vitals filed for this visit.  Physical Exam Deferred.  LABORATORY DATA:  I have reviewed the labs as listed.  CBC    Component Value Date/Time   WBC 5.7 01/27/2018 1405   RBC 4.20 01/27/2018 1405   HGB 12.8 01/27/2018 1405   HGB 11.2 02/12/2016 1140   HCT 40.6 01/27/2018 1405   HCT 36.5 02/12/2016 1140   PLT 241 01/27/2018 1405   PLT 303 02/12/2016 1140   MCV 96.7 01/27/2018 1405   MCV 87 02/12/2016 1140   MCH 30.5 01/27/2018 1405   MCHC 31.5 01/27/2018 1405   RDW 12.9 01/27/2018 1405   RDW 19.2 (H) 02/12/2016 1140   LYMPHSABS 1.3 01/27/2018 1405   LYMPHSABS 1.7 02/12/2016 1140   MONOABS 0.5 01/27/2018 1405   EOSABS 0.2 01/27/2018 1405   EOSABS 0.1 02/12/2016 1140   BASOSABS 0.0 01/27/2018 1405   BASOSABS 0.1 02/12/2016 1140   CMP Latest Ref Rng & Units 01/27/2018 10/28/2017 07/30/2017  Glucose 65 - 99 mg/dL 120(H) 110(H) 129(H)  BUN 6 - 20 mg/dL 16 16 17   Creatinine 0.44 - 1.00 mg/dL 0.79 0.90 0.74  Sodium 135 - 145 mmol/L 138 139 137  Potassium 3.5 - 5.1 mmol/L 3.8 3.8 3.8  Chloride 101 - 111 mmol/L 105 102 102  CO2 22 - 32 mmol/L 25 27 27   Calcium 8.9 - 10.3 mg/dL 10.2 10.2 10.2  Total Protein 6.5 - 8.1 g/dL - - -  Total Bilirubin 0.3 - 1.2 mg/dL - - -  Alkaline Phos 38 - 126 U/L - - -  AST 15 - 41 U/L - - -  ALT 14 - 54 U/L - - -        ASSESSMENT & PLAN:   Iron deficiency anemia due to chronic blood loss 1.   Iron deficiency state secondary to chronic GI blood loss: - Found to have multiple GI AVMs resulting in chronic blood loss. -Requires intermittent Feraheme infusions, last infusion on 08/07/2017. -Denies any bleeding per rectum but has dark stools. - Ferritin has come down to 48 from 237 March.  Hemoglobin is 12.8.  Patient is feeling tired and sleeps a lot according to the son. - I have recommended 2 infusions of Feraheme 1 week apart.  After that we will plan to repeat blood work in 4 to 5 months.      Orders placed this encounter:  Orders Placed This Encounter  Procedures  . Comprehensive metabolic panel  . CBC with Differential  . Vitamin B12  . Folate  . Iron and TIBC  . Ferritin      Derek Jack, MD [place] [phone]

## 2018-01-28 NOTE — Telephone Encounter (Signed)
Carol Massey wanted to let Dr. Nicki Reaper know that [facility] had her labs done yesterday and her iron was down to 40.  He said she is going to have 2 infusions, 1 each week for the next 2 weeks.

## 2018-01-28 NOTE — Telephone Encounter (Signed)
so noted thank you

## 2018-01-28 NOTE — Assessment & Plan Note (Signed)
1.  Iron deficiency state secondary to chronic GI blood loss: - Found to have multiple GI AVMs resulting in chronic blood loss. -Requires intermittent Feraheme infusions, last infusion on 08/07/2017. -Denies any bleeding per rectum but has dark stools. - Ferritin has come down to 48 from 237 March.  Hemoglobin is 12.8.  Patient is feeling tired and sleeps a lot according to the son. - I have recommended 2 infusions of Feraheme 1 week apart.  After that we will plan to repeat blood work in 4 to 5 months.

## 2018-02-03 ENCOUNTER — Inpatient Hospital Stay (HOSPITAL_COMMUNITY): Payer: Medicare Other

## 2018-02-03 ENCOUNTER — Other Ambulatory Visit: Payer: Self-pay

## 2018-02-03 ENCOUNTER — Encounter (HOSPITAL_COMMUNITY): Payer: Self-pay

## 2018-02-03 DIAGNOSIS — Q2733 Arteriovenous malformation of digestive system vessel: Secondary | ICD-10-CM | POA: Diagnosis not present

## 2018-02-03 MED ORDER — SODIUM CHLORIDE 0.9 % IV SOLN
INTRAVENOUS | Status: DC
Start: 2018-02-03 — End: 2018-02-03
  Administered 2018-02-03: 14:00:00 via INTRAVENOUS

## 2018-02-03 MED ORDER — SODIUM CHLORIDE 0.9 % IV SOLN
510.0000 mg | Freq: Once | INTRAVENOUS | Status: AC
  Administered 2018-02-03: 510 mg via INTRAVENOUS
  Filled 2018-02-03: qty 17

## 2018-02-03 NOTE — Progress Notes (Signed)
Tolerated infusion w/o adverse reaction.  Alert, in no distress.  VSS.  Discharged via wheelchair in c/o family.  

## 2018-02-09 ENCOUNTER — Ambulatory Visit: Payer: Medicare Other | Admitting: Family Medicine

## 2018-02-09 ENCOUNTER — Encounter: Payer: Self-pay | Admitting: Family Medicine

## 2018-02-09 VITALS — BP 118/76 | Ht <= 58 in | Wt 106.0 lb

## 2018-02-09 DIAGNOSIS — D509 Iron deficiency anemia, unspecified: Secondary | ICD-10-CM | POA: Diagnosis not present

## 2018-02-09 DIAGNOSIS — G542 Cervical root disorders, not elsewhere classified: Secondary | ICD-10-CM | POA: Diagnosis not present

## 2018-02-09 DIAGNOSIS — J431 Panlobular emphysema: Secondary | ICD-10-CM

## 2018-02-09 MED ORDER — HYDROCODONE-ACETAMINOPHEN 5-325 MG PO TABS: 1.0000 | ORAL_TABLET | Freq: Four times a day (QID) | ORAL | 0 refills | Status: AC | PRN

## 2018-02-09 MED ORDER — ALBUTEROL SULFATE HFA 108 (90 BASE) MCG/ACT IN AERS: INHALATION_SPRAY | RESPIRATORY_TRACT | 4 refills | Status: DC

## 2018-02-09 NOTE — Progress Notes (Signed)
   Subjective:    Patient ID: Carol Massey, female    DOB: February 10, 1926, 84 y.o.   MRN: [id number]  HPIneck pain ongoing on right side.   Shaking on right side from head down to leg.   Insurance will not pay for ventolin.  Apparently they are not paying for pro-air we will send in a new prescription hopefully will pay for this  She states her breathing seems to be doing okay she denies high fever chills sweats denies wheezing difficulty breathing.  She uses Ventolin as needed needs a new prescription Review of Systems     Objective:   Physical Exam Lungs are clear respiratory rate normal heart regular pulse normal BP good subjective discomfort in her neck has some weakness on the right arm right leg also has some involuntary movement of the right arm       Assessment & Plan:  Significant neck pain with nerve impingement patient does not want to do surgery if she feels that she can tolerate things as best she can  If her symptoms persist or progress may need specialist opinion may need to try gabapentin  Hydrocodone for infrequent use caution drowsiness son will help administer  COPD stable  Family concerned about iron deficient anemia we will check CBC before next office visit  Patient follows with hematology they will do comprehensive lab work later this year

## 2018-02-10 ENCOUNTER — Inpatient Hospital Stay (HOSPITAL_COMMUNITY): Payer: Medicare Other

## 2018-02-10 VITALS — BP 132/44 | HR 57 | Temp 98.2°F | Resp 18

## 2018-02-10 DIAGNOSIS — Q2733 Arteriovenous malformation of digestive system vessel: Secondary | ICD-10-CM | POA: Diagnosis not present

## 2018-02-10 DIAGNOSIS — D500 Iron deficiency anemia secondary to blood loss (chronic): Secondary | ICD-10-CM

## 2018-02-10 MED ORDER — SODIUM CHLORIDE 0.9 % IV SOLN
INTRAVENOUS | Status: DC
  Administered 2018-02-10: 14:00:00 via INTRAVENOUS

## 2018-02-10 MED ORDER — SODIUM CHLORIDE 0.9% FLUSH
10.0000 mL | Freq: Once | INTRAVENOUS | Status: AC
  Administered 2018-02-10: 10 mL via INTRAVENOUS

## 2018-02-10 MED ORDER — SODIUM CHLORIDE 0.9 % IV SOLN
510.0000 mg | Freq: Once | INTRAVENOUS | Status: AC
  Administered 2018-02-10: 510 mg via INTRAVENOUS
  Filled 2018-02-10: qty 17

## 2018-02-10 NOTE — Progress Notes (Signed)
Patient tolerated iron infusion with no complaints voiced.  Peripheral IV site clean and dry with good blood return noted before and after administration of iron.  No complaints of pain at site.  Band aid applied.  VSS with discharge and left by wheelchair with family.  No s/s of distress noted.

## 2018-02-10 NOTE — Patient Instructions (Signed)
[facility] at [facility]  Discharge Instructions:  You received an iron infusion today.  _______________________________________________________________  Thank you for choosing [facility] at [facility] to provide your oncology and hematology care.  To afford each patient quality time with our providers, please arrive at least 15 minutes before your scheduled appointment.  You need to re-schedule your appointment if you arrive 10 or more minutes late.  We strive to give you quality time with our providers, and arriving late affects you and other patients whose appointments are after yours.  Also, if you no show three or more times for appointments you may be dismissed from the clinic.  Again, thank you for choosing [facility] at [facility]. Our hope is that these requests will allow you access to exceptional care and in a timely manner. _______________________________________________________________  If you have questions after your visit, please contact our office at [phone] between the hours of 8:30 a.m. and 5:00 p.m. Voicemails left after 4:30 p.m. will not be returned until the following business day. _______________________________________________________________  For prescription refill requests, have your pharmacy contact our office. _______________________________________________________________  Recommendations made by the consultant and any test results will be sent to your referring physician. _______________________________________________________________ 

## 2018-02-14 ENCOUNTER — Other Ambulatory Visit: Payer: Self-pay | Admitting: Family Medicine

## 2018-02-14 DIAGNOSIS — E876 Hypokalemia: Secondary | ICD-10-CM

## 2018-02-14 DIAGNOSIS — D500 Iron deficiency anemia secondary to blood loss (chronic): Secondary | ICD-10-CM

## 2018-02-18 ENCOUNTER — Other Ambulatory Visit: Payer: Self-pay | Admitting: Family Medicine

## 2018-03-02 ENCOUNTER — Encounter: Payer: Self-pay | Admitting: Family Medicine

## 2018-03-02 ENCOUNTER — Ambulatory Visit: Payer: Medicare Other | Admitting: Family Medicine

## 2018-03-02 VITALS — Temp 100.0°F | Ht <= 58 in | Wt 105.0 lb

## 2018-03-02 DIAGNOSIS — J189 Pneumonia, unspecified organism: Secondary | ICD-10-CM | POA: Diagnosis not present

## 2018-03-02 DIAGNOSIS — J441 Chronic obstructive pulmonary disease with (acute) exacerbation: Secondary | ICD-10-CM

## 2018-03-02 DIAGNOSIS — R05 Cough: Secondary | ICD-10-CM

## 2018-03-02 DIAGNOSIS — R059 Cough, unspecified: Secondary | ICD-10-CM

## 2018-03-02 MED ORDER — CEFTRIAXONE SODIUM 500 MG IJ SOLR
500.0000 mg | Freq: Once | INTRAMUSCULAR | Status: AC
  Administered 2018-03-02: 500 mg via INTRAMUSCULAR

## 2018-03-02 MED ORDER — PREDNISONE 10 MG PO TABS: ORAL_TABLET | ORAL | 0 refills | Status: DC

## 2018-03-02 MED ORDER — DOXYCYCLINE HYCLATE 100 MG PO TABS: ORAL_TABLET | ORAL | 0 refills | Status: DC

## 2018-03-02 NOTE — Progress Notes (Signed)
   Subjective:    Patient ID: Carol Massey, female    DOB: 11/21/1925, 84 y.o.   MRN: [id number]  HPI  Patient brought in today by son with complaints of a cough, wheezing, and runny nose for the last 1-2 weeks. Has been taking mucinex. Further history has had cough and congestion for the last 5 or 6 days. Positive known history of COPD.  Diminished energy the last 2 days with mild sleepiness at times.  Worsening wheezing.  Compliant with Symbicort and occasional albuterol.  Noted to have fever today.  Cough productive at times.  Review of Systems No vomiting no diarrhea no rash no chest pain    Objective:   Physical Exam Alert and oriented, vitals reviewed and stable, NAD ENT-TM's and ext canals WNL bilat via otoscopic exam Soft palate, tonsils and post pharynx WNL via oropharyngeal exam Neck-symmetric, no masses; thyroid nonpalpable and nontender Pulmonary-no tachypnea or accessory muscle use; No inspiratory crackles but positive active Tory wheezes and intermittent deep bronchial cough during exam impression potential Card--no abnrml murmurs, rhythm reg and rate WNL Carotid pulses symmetric, without bruits        Assessment & Plan:  1 impression potential early pneumonia with element of reactive airways and exacerbation of COPD.  Discussed the great length.  Warning signs discussed the great length.  Will give modified prednisone taper for age and size, will give Rocephin injection and then doxy twice daily.  Symptoms are discussed.  Albuterol 4 times daily  Greater than 50% of this 25 minute face to face visit was spent in counseling and discussion and coordination of care regarding the above diagnosis/diagnosies

## 2018-03-09 ENCOUNTER — Telehealth: Payer: Self-pay | Admitting: Family Medicine

## 2018-03-09 NOTE — Telephone Encounter (Signed)
Pt's son would like to know if Shyah should come back in and have are recheck on her cough from last week. She is still taking antibiotic.

## 2018-03-09 NOTE — Telephone Encounter (Signed)
I was able to review this with them while the patient was present the lungs sounded good the son will give [place] an update if it is an ongoing issue otherwise finish out the antibiotics they are aware of this already

## 2018-03-11 ENCOUNTER — Ambulatory Visit (INDEPENDENT_AMBULATORY_CARE_PROVIDER_SITE_OTHER): Payer: Medicare Other | Admitting: Family Medicine

## 2018-03-11 ENCOUNTER — Telehealth: Payer: Self-pay | Admitting: Family Medicine

## 2018-03-11 DIAGNOSIS — J209 Acute bronchitis, unspecified: Secondary | ICD-10-CM

## 2018-03-11 DIAGNOSIS — J441 Chronic obstructive pulmonary disease with (acute) exacerbation: Secondary | ICD-10-CM | POA: Diagnosis not present

## 2018-03-11 DIAGNOSIS — J440 Chronic obstructive pulmonary disease with acute lower respiratory infection: Secondary | ICD-10-CM | POA: Diagnosis not present

## 2018-03-11 MED ORDER — PREDNISONE 10 MG PO TABS: ORAL_TABLET | ORAL | 0 refills | Status: DC

## 2018-03-11 MED ORDER — CEFPROZIL 500 MG PO TABS: 500.0000 mg | ORAL_TABLET | Freq: Two times a day (BID) | ORAL | 0 refills | Status: DC

## 2018-03-11 NOTE — Telephone Encounter (Signed)
Patient is still having issues with a wet cough.  Son would like Rx for another antibiotic called in to [facility].

## 2018-03-11 NOTE — Telephone Encounter (Signed)
The patient was seen today 

## 2018-03-11 NOTE — Telephone Encounter (Signed)
Per son she is not running a fever. She has one day of antibx left from last week. Would like to know if you could call in something.

## 2018-03-11 NOTE — Progress Notes (Signed)
  Subjective:     Patient ID: Carol Massey, female   DOB: 1926/06/26, 84 y.o.   MRN: [id number]  HPI This patient has had several weeks of head congestion chest congestion coughing up phlegm now having some low-grade fevers recently on doxycycline and prednisone has been going backwards in her health over the past few days eating some some shortness of breath former smokes  Review of Systems She relates chest congestion coughing wheezing bringing up phlegm she denies high fever chills sweats relates low-grade fever no vomiting or diarrhea muscle pain    Objective:   Physical Exam Ears normal throat normal neck normal lungs clear no crackles congested cough noted no respiratory distress heart regular no murmurs    Assessment:     Progressive bronchitis with secondary infection COPD exacerbation    Plan:     Cefzil 500 mg twice daily 10 days Prednisone taper 6 days

## 2018-03-12 ENCOUNTER — Other Ambulatory Visit: Payer: Self-pay

## 2018-03-12 ENCOUNTER — Telehealth: Payer: Self-pay | Admitting: Family Medicine

## 2018-03-12 MED ORDER — CEFPROZIL 500 MG PO TABS: 500.0000 mg | ORAL_TABLET | Freq: Two times a day (BID) | ORAL | 0 refills | Status: DC

## 2018-03-12 NOTE — Telephone Encounter (Signed)
Sent to [facility] as requested.Son aware.

## 2018-03-12 NOTE — Telephone Encounter (Signed)
Son requesting that the antibiotic we ordered yesterday to be sent to [facility] ° °([facility] doesn't have in stock & didn't order for it to arrive today either) ° °Please call Mike when done  °

## 2018-03-16 ENCOUNTER — Encounter: Payer: Self-pay | Admitting: Family Medicine

## 2018-03-16 ENCOUNTER — Ambulatory Visit: Payer: Medicare Other | Admitting: Family Medicine

## 2018-03-16 VITALS — BP 136/68 | Temp 99.1°F | Wt 107.0 lb

## 2018-03-16 DIAGNOSIS — J019 Acute sinusitis, unspecified: Secondary | ICD-10-CM | POA: Diagnosis not present

## 2018-03-16 MED ORDER — AZITHROMYCIN 250 MG PO TABS: ORAL_TABLET | ORAL | 0 refills | Status: DC

## 2018-03-16 NOTE — Progress Notes (Signed)
   Subjective:    Patient ID: Carol Massey, female    DOB: 19-Oct-1925, 84 y.o.   MRN: [id number]  HPI  Pt is here today to follow up on chest congestion she has had for 2 weeks now. Please see previous notes patient with head congestion drainage coughing intermittent wheezing no other particular troubles no vomiting or diarrhea has been on antibiotics does not seem to be improving but does not seem to be getting worse low-grade fever intermittently Review of Systems  Constitutional: Negative for activity change and fever.  HENT: Positive for congestion and rhinorrhea. Negative for ear pain.   Eyes: Negative for discharge.  Respiratory: Positive for cough. Negative for shortness of breath and wheezing.   Cardiovascular: Negative for chest pain.       Objective:   Physical Exam  Constitutional: She appears well-developed.  HENT:  Head: Normocephalic.  Nose: Nose normal.  Mouth/Throat: Oropharynx is clear and moist. No oropharyngeal exudate.  Neck: Neck supple.  Cardiovascular: Normal rate and normal heart sounds.  No murmur heard. Pulmonary/Chest: Effort normal and breath sounds normal. She has no wheezes.  Lymphadenopathy:    She has no cervical adenopathy.  Skin: Skin is warm and dry.  Nursing note and vitals reviewed.         Assessment & Plan:  Acute rhinosinusitis Add azithromycin Use Cefzil for 7 days Hold off on any x-ray lab work If progressive troubles notify us we will help set up further tests

## 2018-04-04 LAB — CBC WITH DIFFERENTIAL/PLATELET
Basophils Absolute: 0 10*3/uL (ref 0.0–0.2)
Basos: 0 %
EOS (ABSOLUTE): 0.1 10*3/uL (ref 0.0–0.4)
EOS: 2 %
HEMOGLOBIN: 12.3 g/dL (ref 11.1–15.9)
Hematocrit: 40.5 % (ref 34.0–46.6)
IMMATURE GRANS (ABS): 0 10*3/uL (ref 0.0–0.1)
IMMATURE GRANULOCYTES: 0 %
LYMPHS: 27 %
Lymphocytes Absolute: 1.3 10*3/uL (ref 0.7–3.1)
MCH: 29.9 pg (ref 26.6–33.0)
MCHC: 30.4 g/dL — AB (ref 31.5–35.7)
MCV: 98 fL — ABNORMAL HIGH (ref 79–97)
Monocytes Absolute: 0.4 10*3/uL (ref 0.1–0.9)
Monocytes: 8 %
NEUTROS PCT: 63 %
Neutrophils Absolute: 3.1 10*3/uL (ref 1.4–7.0)
Platelets: 278 10*3/uL (ref 150–450)
RBC: 4.12 x10E6/uL (ref 3.77–5.28)
RDW: 14.2 % (ref 12.3–15.4)
WBC: 5 10*3/uL (ref 3.4–10.8)

## 2018-04-05 ENCOUNTER — Telehealth: Payer: Self-pay | Admitting: Family Medicine

## 2018-04-05 ENCOUNTER — Encounter: Payer: Self-pay | Admitting: Family Medicine

## 2018-04-05 DIAGNOSIS — Z780 Asymptomatic menopausal state: Secondary | ICD-10-CM

## 2018-04-05 NOTE — Telephone Encounter (Signed)
I was reviewing over information this patient is due for bone density testing please set it up for this fall

## 2018-04-06 NOTE — Telephone Encounter (Signed)
Patient son is aware sched 05/07/2018 at [facility] be there at 10:15 for a 10:30 am.

## 2018-04-06 NOTE — Addendum Note (Signed)
Addended by: Karle Barr on: 04/06/2018 08:23 AM   Modules accepted: Orders

## 2018-04-13 ENCOUNTER — Encounter (HOSPITAL_COMMUNITY): Payer: Medicare Other

## 2018-04-13 ENCOUNTER — Encounter (HOSPITAL_COMMUNITY): Payer: Self-pay

## 2018-04-13 ENCOUNTER — Encounter (HOSPITAL_COMMUNITY)
Admission: RE | Admit: 2018-04-13 | Discharge: 2018-04-13 | Disposition: A | Discharge status: Home / Self Care | Payer: Medicare Other | Source: Ambulatory Visit | Attending: Family Medicine | Admitting: Family Medicine

## 2018-04-13 DIAGNOSIS — M810 Age-related osteoporosis without current pathological fracture: Secondary | ICD-10-CM | POA: Insufficient documentation

## 2018-04-13 MED ORDER — ZOLEDRONIC ACID 5 MG/100ML IV SOLN
INTRAVENOUS | Status: AC
  Filled 2018-04-13: qty 100

## 2018-04-13 MED ORDER — ZOLEDRONIC ACID 5 MG/100ML IV SOLN
5.0000 mg | Freq: Once | INTRAVENOUS | Status: AC
  Administered 2018-04-13: 5 mg via INTRAVENOUS

## 2018-04-13 MED ORDER — SODIUM CHLORIDE 0.9 % IV SOLN
INTRAVENOUS | Status: DC
  Administered 2018-04-13: 14:00:00 via INTRAVENOUS

## 2018-04-25 ENCOUNTER — Other Ambulatory Visit: Payer: Self-pay | Admitting: Family Medicine

## 2018-04-28 ENCOUNTER — Ambulatory Visit: Payer: Medicare Other | Admitting: Family Medicine

## 2018-04-28 ENCOUNTER — Encounter: Payer: Self-pay | Admitting: Family Medicine

## 2018-04-28 VITALS — BP 112/72 | Ht <= 58 in | Wt 107.2 lb

## 2018-04-28 DIAGNOSIS — Z23 Encounter for immunization: Secondary | ICD-10-CM | POA: Diagnosis not present

## 2018-04-28 DIAGNOSIS — J431 Panlobular emphysema: Secondary | ICD-10-CM | POA: Diagnosis not present

## 2018-04-28 MED ORDER — TIOTROPIUM BROMIDE MONOHYDRATE 18 MCG IN CAPS: ORAL_CAPSULE | RESPIRATORY_TRACT | 3 refills | Status: DC

## 2018-04-28 NOTE — Progress Notes (Signed)
   Subjective:    Patient ID: Carol Massey, female    DOB: [birth year], 84 y.o.   MRN: [id number]  HPI PT here for 3 month follow up.  Patient relates intermittent congestion coughing denies high fever chills sweats denies nausea vomiting diarrhea Cough is still there but not as bad.  Pt is going to ENT on Sept 19 2019 Patient taking blood pressure medicine regular basis seems to be helping Patient for blood pressure check up.  The patient does have hypertension.  The patient is on medication.  Patient relates compliance with meds. Todays BP reviewed with the patient. Patient denies issues with medication. Patient relates reasonable diet. Patient tries to minimize salt. Patient aware of BP goals.  Review of Systems  Constitutional: Negative for activity change and fever.  HENT: Negative for congestion, ear pain and rhinorrhea.   Eyes: Negative for discharge.  Respiratory: Positive for cough. Negative for shortness of breath and wheezing.   Cardiovascular: Negative for chest pain.       Objective:   Physical Exam  Constitutional: She appears well-developed.  HENT:  Head: Normocephalic.  Nose: Nose normal.  Mouth/Throat: Oropharynx is clear and moist. No oropharyngeal exudate.  Neck: Neck supple.  Cardiovascular: Normal rate and normal heart sounds.  No murmur heard. Pulmonary/Chest: Effort normal and breath sounds normal. She has no wheezes.  Lymphadenopathy:    She has no cervical adenopathy.  Skin: Skin is warm and dry.  Nursing note and vitals reviewed.         Assessment & Plan:  COPD and Spiriva for the next 3 months see if this helps if it does not we can stop it continue Symbicort and albuterol  Flu vaccine today

## 2018-05-07 ENCOUNTER — Ambulatory Visit (HOSPITAL_COMMUNITY)
Admission: RE | Admit: 2018-05-07 | Discharge: 2018-05-07 | Disposition: A | Discharge status: Home / Self Care | Payer: Medicare Other | Source: Ambulatory Visit | Attending: Family Medicine | Admitting: Family Medicine

## 2018-05-07 DIAGNOSIS — Z780 Asymptomatic menopausal state: Secondary | ICD-10-CM | POA: Insufficient documentation

## 2018-05-07 DIAGNOSIS — M810 Age-related osteoporosis without current pathological fracture: Secondary | ICD-10-CM | POA: Insufficient documentation

## 2018-05-12 ENCOUNTER — Encounter: Payer: Self-pay | Admitting: Family Medicine

## 2018-05-14 ENCOUNTER — Telehealth: Payer: Self-pay | Admitting: Family Medicine

## 2018-05-14 ENCOUNTER — Ambulatory Visit (INDEPENDENT_AMBULATORY_CARE_PROVIDER_SITE_OTHER): Payer: Medicare Other | Admitting: Otolaryngology

## 2018-05-14 DIAGNOSIS — H903 Sensorineural hearing loss, bilateral: Secondary | ICD-10-CM | POA: Diagnosis not present

## 2018-05-14 NOTE — Telephone Encounter (Signed)
Letter was read to son(DPR). Son verbalized understanding.

## 2018-05-14 NOTE — Telephone Encounter (Signed)
(  Message for Nurse to return call)Patient would like verbal results of bone density test .Results were mailed out on 9/17.

## 2018-05-20 ENCOUNTER — Other Ambulatory Visit: Payer: Self-pay | Admitting: Family Medicine

## 2018-05-26 ENCOUNTER — Other Ambulatory Visit: Payer: Self-pay | Admitting: Family Medicine

## 2018-05-26 MED ORDER — TIOTROPIUM BROMIDE MONOHYDRATE 18 MCG IN CAPS
ORAL_CAPSULE | RESPIRATORY_TRACT | 0 refills | Status: DC
Start: 2018-05-26 — End: 2018-08-21

## 2018-05-26 MED ORDER — PANTOPRAZOLE SODIUM 40 MG PO TBEC: DELAYED_RELEASE_TABLET | ORAL | 0 refills | Status: DC

## 2018-05-26 NOTE — Telephone Encounter (Signed)
REFILL REQUEST: tiotropium (SPIRIVA HANDIHALER) 18 MCG inhalation capsule -90 day supply.   pantoprazole (PROTONIX) 40 MG tablet-90 day supply  Preferred pharmacy: [facility]  [address], [place], [place] [address]  Last seen: 04/28/18 next appt 07/28/18

## 2018-05-26 NOTE — Telephone Encounter (Signed)
Prescriptions sent electronically to pharmacy. Son(DPR) notified.

## 2018-06-01 ENCOUNTER — Telehealth: Payer: Self-pay | Admitting: Family Medicine

## 2018-06-01 ENCOUNTER — Other Ambulatory Visit: Payer: Self-pay

## 2018-06-01 MED ORDER — CITALOPRAM HYDROBROMIDE 10 MG PO TABS: ORAL_TABLET | ORAL | 2 refills | Status: DC

## 2018-06-01 NOTE — Telephone Encounter (Signed)
Patient is requesting refill on citalopram 10 mg tablets 90 day supply to [place].

## 2018-06-01 NOTE — Telephone Encounter (Signed)
May have this +2 refills 

## 2018-06-01 NOTE — Telephone Encounter (Signed)
Please advise 

## 2018-06-01 NOTE — Telephone Encounter (Signed)
Medication sent in and patient son is aware

## 2018-06-08 ENCOUNTER — Other Ambulatory Visit: Payer: Self-pay | Admitting: Family Medicine

## 2018-06-08 NOTE — Telephone Encounter (Signed)
May have this plus four refills 

## 2018-06-30 ENCOUNTER — Inpatient Hospital Stay (HOSPITAL_COMMUNITY): Payer: Medicare Other | Attending: Hematology

## 2018-06-30 DIAGNOSIS — K922 Gastrointestinal hemorrhage, unspecified: Secondary | ICD-10-CM | POA: Insufficient documentation

## 2018-06-30 DIAGNOSIS — D500 Iron deficiency anemia secondary to blood loss (chronic): Secondary | ICD-10-CM | POA: Diagnosis present

## 2018-06-30 LAB — CBC WITH DIFFERENTIAL/PLATELET
Abs Immature Granulocytes: 0.01 10*3/uL (ref 0.00–0.07)
Basophils Absolute: 0.1 10*3/uL (ref 0.0–0.1)
Basophils Relative: 1 %
EOS ABS: 0.1 10*3/uL (ref 0.0–0.5)
EOS PCT: 2 %
HCT: 42.4 % (ref 36.0–46.0)
Hemoglobin: 13.3 g/dL (ref 12.0–15.0)
Immature Granulocytes: 0 %
LYMPHS ABS: 1.2 10*3/uL (ref 0.7–4.0)
LYMPHS PCT: 18 %
MCH: 30.9 pg (ref 26.0–34.0)
MCHC: 31.4 g/dL (ref 30.0–36.0)
MCV: 98.6 fL (ref 80.0–100.0)
Monocytes Absolute: 0.5 10*3/uL (ref 0.1–1.0)
Monocytes Relative: 8 %
Neutro Abs: 4.5 10*3/uL (ref 1.7–7.7)
Neutrophils Relative %: 71 %
PLATELETS: 203 10*3/uL (ref 150–400)
RBC: 4.3 MIL/uL (ref 3.87–5.11)
RDW: 12.2 % (ref 11.5–15.5)
WBC: 6.5 10*3/uL (ref 4.0–10.5)
nRBC: 0 % (ref 0.0–0.2)

## 2018-06-30 LAB — IRON AND TIBC
Iron: 63 ug/dL (ref 28–170)
Saturation Ratios: 20 % (ref 10.4–31.8)
TIBC: 309 ug/dL (ref 250–450)
UIBC: 246 ug/dL

## 2018-06-30 LAB — VITAMIN B12: VITAMIN B 12: 318 pg/mL (ref 180–914)

## 2018-06-30 LAB — COMPREHENSIVE METABOLIC PANEL
ALK PHOS: 76 U/L (ref 38–126)
ALT: 17 U/L (ref 0–44)
AST: 21 U/L (ref 15–41)
Albumin: 4.3 g/dL (ref 3.5–5.0)
Anion gap: 10 (ref 5–15)
BUN: 9 mg/dL (ref 8–23)
CALCIUM: 9.7 mg/dL (ref 8.9–10.3)
CHLORIDE: 105 mmol/L (ref 98–111)
CO2: 24 mmol/L (ref 22–32)
CREATININE: 0.81 mg/dL (ref 0.44–1.00)
Glucose, Bld: 105 mg/dL — ABNORMAL HIGH (ref 70–99)
Potassium: 4.1 mmol/L (ref 3.5–5.1)
SODIUM: 139 mmol/L (ref 135–145)
Total Bilirubin: 1 mg/dL (ref 0.3–1.2)
Total Protein: 7.2 g/dL (ref 6.5–8.1)

## 2018-06-30 LAB — FOLATE: FOLATE: 10.1 ng/mL (ref >5.9)

## 2018-06-30 LAB — FERRITIN: Ferritin: 121 ng/mL (ref 11–307)

## 2018-07-07 ENCOUNTER — Encounter (HOSPITAL_COMMUNITY): Payer: Self-pay | Admitting: Hematology

## 2018-07-07 ENCOUNTER — Other Ambulatory Visit: Payer: Self-pay

## 2018-07-07 ENCOUNTER — Inpatient Hospital Stay (HOSPITAL_COMMUNITY): Payer: Medicare Other | Attending: Hematology | Admitting: Hematology

## 2018-07-07 DIAGNOSIS — K922 Gastrointestinal hemorrhage, unspecified: Secondary | ICD-10-CM | POA: Diagnosis present

## 2018-07-07 DIAGNOSIS — D500 Iron deficiency anemia secondary to blood loss (chronic): Secondary | ICD-10-CM | POA: Diagnosis present

## 2018-07-07 DIAGNOSIS — Q2733 Arteriovenous malformation of digestive system vessel: Secondary | ICD-10-CM | POA: Diagnosis present

## 2018-07-07 NOTE — Progress Notes (Signed)
[place],  [id number]   CLINIC:  Medical Oncology/Hematology  PCP:  [name], MD [address] [place] [address] [phone]   REASON FOR VISIT:  Follow-up for iron deficiency anemia from GI blood loss.  CURRENT THERAPY: Intermittent Feraheme infusions.    INTERVAL HISTORY:  Carol Massey 84 y.o. female returns for follow-up of iron deficiency anemia.  She did receive her him in June and tolerated them very well.  She felt better after that.  Denies any lightheadedness or chest pains.  Denies any bleeding per rectum or melena.  Denies any recent hospitalizations.  No fevers, night sweats or weight loss was reported.  Appetite is 100% and energy is 50%.  REVIEW OF SYSTEMS:  Review of Systems  Constitutional: Positive for fatigue.  Respiratory: Positive for shortness of breath.   Gastrointestinal: Positive for constipation and diarrhea.  Neurological: Negative for numbness.  All other systems reviewed and are negative.    PAST MEDICAL/SURGICAL HISTORY:  Past Medical History:  Diagnosis Date  . Anemia   . Anemia, chronic disease 01/21/2018  . COPD (chronic obstructive pulmonary disease) ([place])   . Dieulafoy lesion of colon 2006  . Gastric AVM 2014  . Generalized anxiety disorder 07/24/2015  . Hard of hearing   . Hypertension   . Iron deficiency anemia due to chronic blood loss 05/27/2014  . Major depression in remission ([place]) 07/24/2015  . Osteoporosis   . PUD (peptic ulcer disease) 11/27/2015   Gastric and duodenal ulcer on EGD  . Stroke ([place])   . Unsteady gait   . Upper GI bleed 11/27/2015   Past Surgical History:  Procedure Laterality Date  . ABDOMINAL HYSTERECTOMY    . APPENDECTOMY    . COLONOSCOPY  03/2005   Dr. Gala Romney: rectal and rectosigmoid polyps removed  . COLONOSCOPY WITH ESOPHAGOGASTRODUODENOSCOPY (EGD) N/A 08/12/2013   Dr. Gala Romney: colonoscopy with tubular adenoma, colonic diverticulosis. EGD with  non-critical Schatzki's ring, non-manipulated, and gastric AVM with evidence of recent bleeding, s/p thermal sealing and hemostasis clip  . ESOPHAGEAL DILATION  11/27/2015   Procedure: ESOPHAGEAL DILATION;  Surgeon: Daneil Dolin, MD;  Location: [facility];  Service: Endoscopy;;  . ESOPHAGOGASTRODUODENOSCOPY  03/2005   Dr. Gala Romney: tiny distal esophageal erosions  . ESOPHAGOGASTRODUODENOSCOPY N/A 11/27/2015   RMR: Cricopharyngeus web, status post dilation. Nonbleeding duodenal and gastric ulcers without stigmata of bleeding. Gastric biopsies benign  . ESOPHAGOGASTRODUODENOSCOPY N/A 02/14/2016   Procedure: ESOPHAGOGASTRODUODENOSCOPY (EGD);  Surgeon: Daneil Dolin, MD;  Location: [facility];  Service: Endoscopy;  Laterality: N/A;  300  . GIVENS CAPSULE STUDY N/A 11/27/2015   NOT DONE, WAS NOT INDICATED AT THE TIME  . MALONEY DILATION N/A 02/14/2016   Procedure: Venia Minks DILATION;  Surgeon: Daneil Dolin, MD;  Location: [facility];  Service: Endoscopy;  Laterality: N/A;  . small bowel capsule study  03/2005   Dr. Laural Golden: two small Dieulafoy lesions at ascending colon. tiney specks of blood at duodenal bulb but no identified lesions  . TONSILLECTOMY       SOCIAL HISTORY:  Social History   Socioeconomic History  . Marital status: Married    Spouse name: Not on file  . Number of children: 3  . Years of education: Not on file  . Highest education level: Not on file  Occupational History  . Occupation: retired from [employer]  . Financial resource strain: Not hard at all  .  Food insecurity:    Worry: Never true    Inability: Never true  . Transportation needs:    Medical: No    Non-medical: No  Tobacco Use  . Smoking status: Former Smoker    Packs/day: 0.50    Years: 70.00    Pack years: 35.00    Types: Cigarettes    Last attempt to quit: 08/26/2014    Years since quitting: 3.8  . Smokeless tobacco: Never Used  . Tobacco comment: about 1/2 ppd  Substance and  Sexual Activity  . Alcohol use: No    Alcohol/week: 0.0 standard drinks  . Drug use: No  . Sexual activity: Not on file    Comment: married  Lifestyle  . Physical activity:    Days per week: 0 days    Minutes per session: 0 min  . Stress: Not at all  Relationships  . Social connections:    Talks on phone: More than three times a week    Gets together: More than three times a week    Attends religious service: More than 4 times per year    Active member of club or organization: Yes    Attends meetings of clubs or organizations: 1 to 4 times per year    Relationship status: Married  . Intimate partner violence:    Fear of current or ex partner: No    Emotionally abused: No    Physically abused: No    Forced sexual activity: No  Other Topics Concern  . Not on file  Social History Narrative   Lives with husband and son   Caffeine use: 1 cup a day of coffee   Diet coke- 16oz daily   Right handed    FAMILY HISTORY:  Family History  Problem Relation Age of Onset  . Hypertension Mother   . Stroke Mother   . Leukemia Father 27  . Colon cancer Neg Hx     CURRENT MEDICATIONS:  Outpatient Encounter Medications as of 07/07/2018  Medication Sig  . HYDROcodone-acetaminophen (NORCO/VICODIN) 5-325 MG tablet Take 1 tablet by mouth every 6 (six) hours as needed for moderate pain.  [name] acetaminophen (TYLENOL) 500 MG tablet Take 500 mg by mouth daily.   [name] albuterol (PROVENTIL HFA;VENTOLIN HFA) 108 (90 Base) MCG/ACT inhaler INHALE 2 PUFFS INTO THE LUNGS EVERY 4 HOURS AS NEEDED FOR WHEEZING OR SHORTNESS OF BREATH.  [name] ALPRAZolam (XANAX) 0.25 MG tablet TAKE 1/2 TABLET BY MOUTH 2 TIMES A DAY AS NEEDED FOR ANXIETY.  [name] amLODipine (NORVASC) 2.5 MG tablet TAKE 1 TABLET BY MOUTH EVERY DAY  . Ascorbic Acid (VITAMIN C PLUS WILD ROSE HIPS PO) Take 1 tablet by mouth daily.  [name] azithromycin (ZITHROMAX Z-PAK) 250 MG tablet Take 2 tablets (500 mg) on  Day 1,  followed by 1 tablet (250 mg) once daily on  Days 2 through 5.  . Calcium Carb-Cholecalciferol (CALCIUM + D3 PO) Take 1 tablet by mouth daily.   . cefPROZIL (CEFZIL) 500 MG tablet Take 1 tablet (500 mg total) by mouth 2 (two) times daily. (Patient not taking: Reported on 04/28/2018)  . citalopram (CELEXA) 10 MG tablet Take 10 mg by mouth every day  . KLOR-CON M20 20 MEQ tablet TAKE 1 TABLET BY MOUTH TWICE A DAY  . pantoprazole (PROTONIX) 40 MG tablet TAKE 1 TABLET BY MOUTH DAILY. FOR TREATMENT OF THE ULCER.  . SYMBICORT 80-4.5 MCG/ACT inhaler INHALE 2 PUFFS INTO THE LUNGS TWICE DAILY.  [name] tiotropium (SPIRIVA HANDIHALER) 18 MCG  inhalation capsule One inhalation every day  . [DISCONTINUED] predniSONE (DELTASONE) 10 MG tablet 3qd for 2d then 2qd for 2d then 1qd for 2d (Patient not taking: Reported on 04/28/2018)   Facility-Administered Encounter Medications as of 07/07/2018  Medication  . sodium chloride flush (NS) 0.9 % injection 10 mL    ALLERGIES:  No Known Allergies   PHYSICAL EXAM:  ECOG Performance status: 2  Vitals:   07/07/18 1452  BP: (!) 145/65  Pulse: 68  Resp: 16  Temp: 98.4 F (36.9 C)  SpO2: 96%   Filed Weights   07/07/18 1452  Weight: 108 lb 4.8 oz (49.1 kg)    Physical Exam HEENT: Oropharynx has no thrush. Chest: Bilateral clear to auscultation. CVS: S1-S2 regular rate and rhythm.  Systolic murmur heard. Extremities: No edema or cyanosis.  LABORATORY DATA:  I have reviewed the labs as listed.  CBC    Component Value Date/Time   WBC 6.5 06/30/2018 1059   RBC 4.30 06/30/2018 1059   HGB 13.3 06/30/2018 1059   HGB 12.3 04/03/2018 1002   HCT 42.4 06/30/2018 1059   HCT 40.5 04/03/2018 1002   PLT 203 06/30/2018 1059   PLT 278 04/03/2018 1002   MCV 98.6 06/30/2018 1059   MCV 98 (H) 04/03/2018 1002   MCH 30.9 06/30/2018 1059   MCHC 31.4 06/30/2018 1059   RDW 12.2 06/30/2018 1059   RDW 14.2 04/03/2018 1002   LYMPHSABS 1.2 06/30/2018 1059   LYMPHSABS 1.3 04/03/2018 1002   MONOABS 0.5 06/30/2018 1059     EOSABS 0.1 06/30/2018 1059   EOSABS 0.1 04/03/2018 1002   BASOSABS 0.1 06/30/2018 1059   BASOSABS 0.0 04/03/2018 1002   CMP Latest Ref Rng & Units 06/30/2018 01/27/2018 10/28/2017  Glucose 70 - 99 mg/dL 105(H) 120(H) 110(H)  BUN 8 - 23 mg/dL 9 16 16   Creatinine 0.44 - 1.00 mg/dL 0.81 0.79 0.90  Sodium 135 - 145 mmol/L 139 138 139  Potassium 3.5 - 5.1 mmol/L 4.1 3.8 3.8  Chloride 98 - 111 mmol/L 105 105 102  CO2 22 - 32 mmol/L 24 25 27   Calcium 8.9 - 10.3 mg/dL 9.7 10.2 10.2  Total Protein 6.5 - 8.1 g/dL 7.2 - -  Total Bilirubin 0.3 - 1.2 mg/dL 1.0 - -  Alkaline Phos 38 - 126 U/L 76 - -  AST 15 - 41 U/L 21 - -  ALT 0 - 44 U/L 17 - -        ASSESSMENT & PLAN:   Iron deficiency anemia due to chronic blood loss 1.  Iron deficiency state secondary to chronic GI blood loss: - Found to have multiple GI AVMs resulting in chronic blood loss. - Requires intermittent Feraheme infusions.  Last received on 02/03/2018 and 02/10/2018. - Denies any bleeding per rectum or melena. -I have discussed the results of the blood work from today.  Hemoglobin is 13.3.  Ferritin was 121.  Percent saturation was 20. -I did not recommend any iron infusion at this time.  We will see her back in 3 months for follow-up.      Orders placed this encounter:  No orders of the defined types were placed in this encounter.     Derek Jack, MD [place] [phone]

## 2018-07-07 NOTE — Patient Instructions (Signed)
[facility] at [facility]  Discharge Instructions:  You saw Dr. Katragadda today. _______________________________________________________________  Thank you for choosing [facility] at [facility] to provide your oncology and hematology care.  To afford each patient quality time with our providers, please arrive at least 15 minutes before your scheduled appointment.  You need to re-schedule your appointment if you arrive 10 or more minutes late.  We strive to give you quality time with our providers, and arriving late affects you and other patients whose appointments are after yours.  Also, if you no show three or more times for appointments you may be dismissed from the clinic.  Again, thank you for choosing [facility] at  Hospital. Our hope is that these requests will allow you access to exceptional care and in a timely manner. _______________________________________________________________  If you have questions after your visit, please contact our office at [phone] between the hours of 8:30 a.m. and 5:00 p.m. Voicemails left after 4:30 p.m. will not be returned until the following business day. _______________________________________________________________  For prescription refill requests, have your pharmacy contact our office. _______________________________________________________________  Recommendations made by the consultant and any test results will be sent to your referring physician. _______________________________________________________________ 

## 2018-07-07 NOTE — Assessment & Plan Note (Signed)
1.  Iron deficiency state secondary to chronic GI blood loss: - Found to have multiple GI AVMs resulting in chronic blood loss. - Requires intermittent Feraheme infusions.  Last received on 02/03/2018 and 02/10/2018. - Denies any bleeding per rectum or melena. -I have discussed the results of the blood work from today.  Hemoglobin is 13.3.  Ferritin was 121.  Percent saturation was 20. -I did not recommend any iron infusion at this time.  We will see her back in 3 months for follow-up.

## 2018-07-28 ENCOUNTER — Encounter: Payer: Self-pay | Admitting: Family Medicine

## 2018-07-28 ENCOUNTER — Ambulatory Visit: Payer: Medicare Other | Admitting: Family Medicine

## 2018-07-28 VITALS — BP 128/86 | Ht <= 58 in

## 2018-07-28 DIAGNOSIS — W01198A Fall on same level from slipping, tripping and stumbling with subsequent striking against other object, initial encounter: Secondary | ICD-10-CM | POA: Diagnosis not present

## 2018-07-28 DIAGNOSIS — J431 Panlobular emphysema: Secondary | ICD-10-CM

## 2018-07-28 DIAGNOSIS — I10 Essential (primary) hypertension: Secondary | ICD-10-CM

## 2018-07-28 DIAGNOSIS — R51 Headache: Secondary | ICD-10-CM | POA: Diagnosis not present

## 2018-07-28 DIAGNOSIS — Z23 Encounter for immunization: Secondary | ICD-10-CM | POA: Diagnosis not present

## 2018-07-28 DIAGNOSIS — F325 Major depressive disorder, single episode, in full remission: Secondary | ICD-10-CM

## 2018-07-28 DIAGNOSIS — F411 Generalized anxiety disorder: Secondary | ICD-10-CM

## 2018-07-28 DIAGNOSIS — S01119A Laceration without foreign body of unspecified eyelid and periocular area, initial encounter: Secondary | ICD-10-CM

## 2018-07-28 DIAGNOSIS — S01111A Laceration without foreign body of right eyelid and periocular area, initial encounter: Secondary | ICD-10-CM | POA: Diagnosis not present

## 2018-07-28 MED ORDER — ZOSTER VAC RECOMB ADJUVANTED 50 MCG/0.5ML IM SUSR: 0.5000 mL | Freq: Once | INTRAMUSCULAR | 1 refills | Status: AC

## 2018-07-28 NOTE — Progress Notes (Signed)
Subjective:    Patient ID: Carol Massey, female    DOB: March 16, 1926, 84 y.o.   MRN: [id number]  Hypertension  This is a chronic problem. Pertinent negatives include no chest pain or shortness of breath. There are no compliance problems (takes meds every day and eats healthy).    [place] coming out of the bank today. It was a bump on the ramp. Was wearing sunglasses and they broke and son thinks the sunglasses was what cut her above right eye.   Patient does have COPD she does use her inhalers on a regular basis  Medications reviewed and updated  Moods overall been doing well gets anxious at times uses Xanax intermittently also uses her Celexa on a regular basis    Review of Systems  Constitutional: Negative for activity change, appetite change and fatigue.  HENT: Negative for congestion and rhinorrhea.   Respiratory: Negative for cough and shortness of breath.   Cardiovascular: Negative for chest pain and leg swelling.  Gastrointestinal: Negative for abdominal pain and diarrhea.  Endocrine: Negative for polydipsia and polyphagia.  Skin: Negative for color change.  Neurological: Negative for dizziness and weakness.  Psychiatric/Behavioral: Negative for behavioral problems and confusion.       Objective:   Physical Exam  Constitutional: She appears well-nourished. No distress.  HENT:  Head: Normocephalic and atraumatic.  Eyes: Right eye exhibits no discharge. Left eye exhibits no discharge.  Neck: No tracheal deviation present.  Cardiovascular: Normal rate, regular rhythm and normal heart sounds.  No murmur heard. Pulmonary/Chest: Effort normal and breath sounds normal. No respiratory distress.  Musculoskeletal: She exhibits no edema.  Lymphadenopathy:    She has no cervical adenopathy.  Neurological: She is alert. Coordination normal.  Skin: Skin is warm and dry.  Psychiatric: She has a normal mood and affect. Her behavior is normal.  Vitals reviewed.           Assessment & Plan:  Procedure Patient had laceration around the right eyebrow region Timeout was taken Proper measures were taken it was cleansed with saline 2 sutures were placed 6-0 Ethilon No complications Lidocaine with epinephrine was used to numb the area  COPD stable continue current inhalers  Contusion and fall no x-rays indicated warning signs were discussed  HTN- Patient was seen today as part of a visit regarding hypertension. The importance of healthy diet and regular physical activity was discussed. The importance of compliance with medications discussed.  Ideal goal is to keep blood pressure low elevated levels certainly below 109/32 when possible.  The patient was counseled that keeping blood pressure under control lessen his risk of complications.  The importance of regular follow-ups was discussed with the patient.  Low-salt diet such as DASH recommended.  Regular physical activity was recommended as well.  Patient was advised to keep regular follow-ups.  The patient was seen today for GERD. Patient benefits from medication. Patient to continue medication. Keep all regular follow ups.  Patient on Celexa and Xanax uses it on a regular basis not having any problems  Suture removal in 4 days Depression in remission takes medicine benefits from it continue this Follow-up office visit 3 to 4 months  25 minutes was spent with the patient.  This statement verifies that 25 minutes was indeed spent with the patient.  More than 50% of this visit-total duration of the visit-was spent in counseling and coordination of care. The issues that the patient came in for today as reflected in the diagnosis (s)  please refer to documentation for further details.

## 2018-07-31 ENCOUNTER — Encounter: Payer: Self-pay | Admitting: Family Medicine

## 2018-07-31 ENCOUNTER — Ambulatory Visit: Payer: Medicare Other | Admitting: Family Medicine

## 2018-07-31 VITALS — BP 118/74 | Temp 98.8°F | Wt 107.0 lb

## 2018-07-31 DIAGNOSIS — Z4802 Encounter for removal of sutures: Secondary | ICD-10-CM | POA: Diagnosis not present

## 2018-07-31 DIAGNOSIS — S01119A Laceration without foreign body of unspecified eyelid and periocular area, initial encounter: Secondary | ICD-10-CM

## 2018-07-31 DIAGNOSIS — J431 Panlobular emphysema: Secondary | ICD-10-CM | POA: Diagnosis not present

## 2018-07-31 NOTE — Progress Notes (Addendum)
   Subjective:    Patient ID: Carol Massey, female    DOB: October 09, 1925, 84 y.o.   MRN: [id number]  HPIsuture removal.   Son also states she was short of breath getting out of the shower this morning. He checked her 02 and it was 97% before he gave symbicort. symbicort did help. Checked 02 in the office and it was 97%.   Patient states she is breathing fine now denies any significant troubles Patient does have COPD   Review of Systems    No current shortness of breath coughing fever chills sweats vomiting hemoptysis no dizziness Objective:   Physical Exam   The area is healing without any problems 2 sutures removed without difficulty Steri-Strips placed     Assessment & Plan:  Sutures removed without difficulty No flareup of her COPD Follow-up if progressive troubles Face-to-face evaluation completed today Hospital bed necessary because patient unable to use standard bed.  Patient has cervical stenosis as well as right extremity weakness.  She needs a hospital bed to adjust the angle of the bed to alleviate neck pain she also needs hospital bed to be able to adjust height so that she can safely get in and out of bed she is unable to use a standard bed currently.

## 2018-08-05 ENCOUNTER — Telehealth: Payer: Self-pay | Admitting: Family Medicine

## 2018-08-05 NOTE — Telephone Encounter (Signed)
The strips will gradually fall off Trying to pull them off could cause a skin tear It is okay to get him wet at this point but pat them dry afterwards  If any of the edges peel up he can carefully trim them back

## 2018-08-05 NOTE — Telephone Encounter (Signed)
Discussed with pt's son [name]. He verbalized understanding of all

## 2018-08-05 NOTE — Telephone Encounter (Signed)
Pt's son is calling in to know when he should take the strips off Carol Massey eye from her fall last week.    CB# [phone].

## 2018-08-13 ENCOUNTER — Telehealth: Payer: Self-pay | Admitting: Family Medicine

## 2018-08-13 NOTE — Telephone Encounter (Signed)
Let's do 

## 2018-08-13 NOTE — Telephone Encounter (Signed)
[facility] called to [facility] for hospital bed for patient.

## 2018-08-14 NOTE — Telephone Encounter (Signed)
Script sent to [facility]  

## 2018-08-14 NOTE — Telephone Encounter (Signed)
Script written out and awaiting signature

## 2018-08-17 ENCOUNTER — Telehealth: Payer: Self-pay | Admitting: Family Medicine

## 2018-08-17 NOTE — Telephone Encounter (Signed)
Family requesting a hospital bed prescription for [facility]  Cervical stenosis as well as right arm weakness as a diagnosis  Hospital bed necessary in order to allow for adjustment of angle and height to allow her to be more independent  Currently she is unable to use a standard bed

## 2018-08-17 NOTE — Telephone Encounter (Signed)
rx faxed to [facility]

## 2018-08-21 ENCOUNTER — Other Ambulatory Visit: Payer: Self-pay | Admitting: Family Medicine

## 2018-08-25 ENCOUNTER — Other Ambulatory Visit: Payer: Self-pay | Admitting: Family Medicine

## 2018-08-28 ENCOUNTER — Telehealth: Payer: Self-pay | Admitting: Family Medicine

## 2018-08-28 NOTE — Telephone Encounter (Signed)
Pt's son called in to let Dr. Nicki Reaper know we can wait on ordering hospital bed for pt. Her husband is in the hospital and will be transferred to the [facility].

## 2018-08-28 NOTE — Telephone Encounter (Signed)
Okay so noted 

## 2018-08-28 NOTE — Telephone Encounter (Signed)
FYI

## 2018-09-30 ENCOUNTER — Other Ambulatory Visit (HOSPITAL_COMMUNITY): Payer: Self-pay | Admitting: Emergency Medicine

## 2018-09-30 DIAGNOSIS — D500 Iron deficiency anemia secondary to blood loss (chronic): Secondary | ICD-10-CM

## 2018-10-01 ENCOUNTER — Inpatient Hospital Stay (HOSPITAL_COMMUNITY): Payer: Medicare Other | Attending: Hematology

## 2018-10-01 DIAGNOSIS — K922 Gastrointestinal hemorrhage, unspecified: Secondary | ICD-10-CM | POA: Insufficient documentation

## 2018-10-01 DIAGNOSIS — D500 Iron deficiency anemia secondary to blood loss (chronic): Secondary | ICD-10-CM | POA: Diagnosis present

## 2018-10-01 DIAGNOSIS — E538 Deficiency of other specified B group vitamins: Secondary | ICD-10-CM | POA: Insufficient documentation

## 2018-10-01 LAB — COMPREHENSIVE METABOLIC PANEL
ALT: 14 U/L (ref 0–44)
ANION GAP: 8 (ref 5–15)
AST: 18 U/L (ref 15–41)
Albumin: 4 g/dL (ref 3.5–5.0)
Alkaline Phosphatase: 84 U/L (ref 38–126)
BUN: 13 mg/dL (ref 8–23)
CALCIUM: 9.8 mg/dL (ref 8.9–10.3)
CO2: 26 mmol/L (ref 22–32)
Chloride: 105 mmol/L (ref 98–111)
Creatinine, Ser: 0.69 mg/dL (ref 0.44–1.00)
GFR calc Af Amer: >60 mL/min (ref >60)
GLUCOSE: 104 mg/dL — AB (ref 70–99)
Potassium: 4 mmol/L (ref 3.5–5.1)
Sodium: 139 mmol/L (ref 135–145)
TOTAL PROTEIN: 6.6 g/dL (ref 6.5–8.1)
Total Bilirubin: 0.9 mg/dL (ref 0.3–1.2)

## 2018-10-01 LAB — IRON AND TIBC
Iron: 65 ug/dL (ref 28–170)
Saturation Ratios: 23 % (ref 10.4–31.8)
TIBC: 288 ug/dL (ref 250–450)
UIBC: 223 ug/dL

## 2018-10-01 LAB — VITAMIN B12: Vitamin B-12: 261 pg/mL (ref 180–914)

## 2018-10-01 LAB — FOLATE: Folate: 8.9 ng/mL (ref >5.9)

## 2018-10-01 LAB — FERRITIN: FERRITIN: 62 ng/mL (ref 11–307)

## 2018-10-08 ENCOUNTER — Ambulatory Visit (HOSPITAL_COMMUNITY): Payer: Medicare Other | Admitting: Hematology

## 2018-10-12 ENCOUNTER — Other Ambulatory Visit (HOSPITAL_COMMUNITY): Payer: Self-pay | Admitting: Nurse Practitioner

## 2018-10-13 ENCOUNTER — Other Ambulatory Visit (HOSPITAL_COMMUNITY): Payer: Medicare Other

## 2018-10-15 ENCOUNTER — Inpatient Hospital Stay (HOSPITAL_COMMUNITY): Payer: Medicare Other

## 2018-10-15 DIAGNOSIS — K922 Gastrointestinal hemorrhage, unspecified: Secondary | ICD-10-CM | POA: Diagnosis not present

## 2018-10-15 DIAGNOSIS — D500 Iron deficiency anemia secondary to blood loss (chronic): Secondary | ICD-10-CM

## 2018-10-15 LAB — CBC WITH DIFFERENTIAL/PLATELET
ABS IMMATURE GRANULOCYTES: 0.01 10*3/uL (ref 0.00–0.07)
BASOS ABS: 0.1 10*3/uL (ref 0.0–0.1)
Basophils Relative: 1 %
Eosinophils Absolute: 0.1 10*3/uL (ref 0.0–0.5)
Eosinophils Relative: 2 %
HCT: 39.4 % (ref 36.0–46.0)
Hemoglobin: 11.9 g/dL — ABNORMAL LOW (ref 12.0–15.0)
IMMATURE GRANULOCYTES: 0 %
Lymphocytes Relative: 22 %
Lymphs Abs: 1.2 10*3/uL (ref 0.7–4.0)
MCH: 29.7 pg (ref 26.0–34.0)
MCHC: 30.2 g/dL (ref 30.0–36.0)
MCV: 98.3 fL (ref 80.0–100.0)
Monocytes Absolute: 0.5 10*3/uL (ref 0.1–1.0)
Monocytes Relative: 9 %
NEUTROS ABS: 3.8 10*3/uL (ref 1.7–7.7)
NEUTROS PCT: 66 %
NRBC: 0 % (ref 0.0–0.2)
PLATELETS: 220 10*3/uL (ref 150–400)
RBC: 4.01 MIL/uL (ref 3.87–5.11)
RDW: 13.1 % (ref 11.5–15.5)
WBC: 5.7 10*3/uL (ref 4.0–10.5)

## 2018-10-20 ENCOUNTER — Other Ambulatory Visit: Payer: Self-pay

## 2018-10-20 ENCOUNTER — Inpatient Hospital Stay (HOSPITAL_COMMUNITY): Payer: Medicare Other | Admitting: Hematology

## 2018-10-20 ENCOUNTER — Encounter (HOSPITAL_COMMUNITY): Payer: Self-pay | Admitting: Hematology

## 2018-10-20 VITALS — BP 132/56 | HR 70 | Temp 99.2°F | Resp 16 | Wt 100.0 lb

## 2018-10-20 DIAGNOSIS — D500 Iron deficiency anemia secondary to blood loss (chronic): Secondary | ICD-10-CM | POA: Diagnosis not present

## 2018-10-20 DIAGNOSIS — K922 Gastrointestinal hemorrhage, unspecified: Secondary | ICD-10-CM | POA: Diagnosis not present

## 2018-10-20 DIAGNOSIS — E538 Deficiency of other specified B group vitamins: Secondary | ICD-10-CM

## 2018-10-20 MED ORDER — CYANOCOBALAMIN 1000 MCG/ML IJ SOLN
1000.0000 ug | Freq: Once | INTRAMUSCULAR | Status: AC
  Administered 2018-10-20: 1000 ug via INTRAMUSCULAR
  Filled 2018-10-20: qty 1

## 2018-10-20 NOTE — Progress Notes (Signed)
[place], [place] [address]   CLINIC:  Medical Oncology/Hematology  PCP:  [name], MD [address] [place] [address] [phone]   REASON FOR VISIT: Follow-up for iron deficiency anemia from GI blood loss.  CURRENT THERAPY: Intermittent Feraheme infusions.   INTERVAL HISTORY:  Ms. Ainley 85 y.o. female returns for routine follow-up for iron deficiency anemia from GI blood loss. She has been doing well since her last visit. She reports SOB with exertion due to her COPD. She also has depression and sleep disturbances. She was stressed due to her husband being placed in a nursing home for rehab but he is home now and she is feeling better. She Denies any nausea, vomiting, or diarrhea. Denies any new pains. Had not noticed any recent bleeding such as epistaxis, hematuria or hematochezia. Denies recent chest pain on exertion, shortness of breath on minimal exertion, pre-syncopal episodes, or palpitations. Denies any numbness or tingling in hands or feet. Denies any recent fevers, infections, or recent hospitalizations. Patient reports appetite at 100% and energy level at 50%. Her appetite is slowly increasing.    REVIEW OF SYSTEMS:  Review of Systems  Constitutional: Positive for fatigue.  Respiratory: Positive for shortness of breath.   Neurological: Positive for numbness.  Hematological: Bruises/bleeds easily.  Psychiatric/Behavioral: Positive for depression and sleep disturbance.  All other systems reviewed and are negative.    PAST MEDICAL/SURGICAL HISTORY:  Past Medical History:  Diagnosis Date  . Anemia   . Anemia, chronic disease 01/21/2018  . COPD (chronic obstructive pulmonary disease) ([place])   . Dieulafoy lesion of colon 2006  . Gastric AVM 2014  . Generalized anxiety disorder 07/24/2015  . Hard of hearing   . Hypertension   . Iron deficiency anemia due to chronic blood loss 05/27/2014  . Major depression in  remission ([place]) 07/24/2015  . Osteoporosis   . PUD (peptic ulcer disease) 11/27/2015   Gastric and duodenal ulcer on EGD  . Stroke ([place])   . Unsteady gait   . Upper GI bleed 11/27/2015   Past Surgical History:  Procedure Laterality Date  . ABDOMINAL HYSTERECTOMY    . APPENDECTOMY    . COLONOSCOPY  03/2005   Dr. Gala Romney: rectal and rectosigmoid polyps removed  . COLONOSCOPY WITH ESOPHAGOGASTRODUODENOSCOPY (EGD) N/A 08/12/2013   Dr. Gala Romney: colonoscopy with tubular adenoma, colonic diverticulosis. EGD with non-critical Schatzki's ring, non-manipulated, and gastric AVM with evidence of recent bleeding, s/p thermal sealing and hemostasis clip  . ESOPHAGEAL DILATION  11/27/2015   Procedure: ESOPHAGEAL DILATION;  Surgeon: Daneil Dolin, MD;  Location[facility];  Service: Endoscopy;;  . ESOPHAGOGASTRODUODENOSCOPY  03/2005   Dr. Gala Romney: tiny distal esophageal erosions  . ESOPHAGOGASTRODUODENOSCOPY N/A 11/27/2015   RMR: Cricopharyngeus web, status post dilation. Nonbleeding duodenal and gastric ulcers without stigmata of bleeding. Gastric biopsies benign  . ESOPHAGOGASTRODUODENOSCOPY N/A 02/14/2016   Procedure: ESOPHAGOGASTRODUODENOSCOPY (EGD);  Surgeon: Daneil Dolin, MD;  Location: [facility];  Service: Endoscopy;  Laterality: N/A;  300  . GIVENS CAPSULE STUDY N/A 11/27/2015   NOT DONE, WAS NOT INDICATED AT THE TIME  . MALONEY DILATION N/A 02/14/2016   Procedure: Venia Minks DILATION;  Surgeon: Daneil Dolin, MD;  Location[facility];  Service: Endoscopy;  Laterality: N/A;  . small bowel capsule study  03/2005   Dr. Laural Golden: two small Dieulafoy lesions at ascending colon. tiney specks of blood at duodenal bulb but no identified lesions  .  TONSILLECTOMY       SOCIAL HISTORY:  Social History   Socioeconomic History  . Marital status: Married    Spouse name: Not on file  . Number of children: 3  . Years of education: Not on file  . Highest education level: Not on file  Occupational History   . Occupation: retired from [employer]  . Financial resource strain: Not hard at all  . Food insecurity:    Worry: Never true    Inability: Never true  . Transportation needs:    Medical: No    Non-medical: No  Tobacco Use  . Smoking status: Former Smoker    Packs/day: 0.50    Years: 70.00    Pack years: 35.00    Types: Cigarettes    Last attempt to quit: 08/26/2014    Years since quitting: 4.1  . Smokeless tobacco: Never Used  . Tobacco comment: about 1/2 ppd  Substance and Sexual Activity  . Alcohol use: No    Alcohol/week: 0.0 standard drinks  . Drug use: No  . Sexual activity: Not on file    Comment: married  Lifestyle  . Physical activity:    Days per week: 0 days    Minutes per session: 0 min  . Stress: Not at all  Relationships  . Social connections:    Talks on phone: More than three times a week    Gets together: More than three times a week    Attends religious service: More than 4 times per year    Active member of club or organization: Yes    Attends meetings of clubs or organizations: 1 to 4 times per year    Relationship status: Married  . Intimate partner violence:    Fear of current or ex partner: No    Emotionally abused: No    Physically abused: No    Forced sexual activity: No  Other Topics Concern  . Not on file  Social History Narrative   Lives with husband and son   Caffeine use: 1 cup a day of coffee   Diet coke- 16oz daily   Right handed    FAMILY HISTORY:  Family History  Problem Relation Age of Onset  . Hypertension Mother   . Stroke Mother   . Leukemia Father 17  . Colon cancer Neg Hx     CURRENT MEDICATIONS:  Outpatient Encounter Medications as of 10/20/2018  Medication Sig  . acetaminophen (TYLENOL) 500 MG tablet Take 500 mg by mouth daily.   [name] albuterol (PROVENTIL HFA;VENTOLIN HFA) 108 (90 Base) MCG/ACT inhaler INHALE 2 PUFFS INTO THE LUNGS EVERY 4 HOURS AS NEEDED FOR WHEEZING OR SHORTNESS OF BREATH.  [name]  ALPRAZolam (XANAX) 0.25 MG tablet TAKE 1/2 TABLET BY MOUTH 2 TIMES A DAY AS NEEDED FOR ANXIETY.  [name] amLODipine (NORVASC) 2.5 MG tablet TAKE 1 TABLET BY MOUTH ONCE A DAY.  [name] Ascorbic Acid (VITAMIN C PLUS WILD ROSE HIPS PO) Take 1 tablet by mouth daily.  . Calcium Carb-Cholecalciferol (CALCIUM + D3 PO) Take 1 tablet by mouth daily.   . citalopram (CELEXA) 10 MG tablet Take 10 mg by mouth every day  . HYDROcodone-acetaminophen (NORCO/VICODIN) 5-325 MG tablet Take 1 tablet by mouth every 6 (six) hours as needed for moderate pain.  [name] KLOR-CON M20 20 MEQ tablet TAKE 1 TABLET BY MOUTH TWICE A DAY  . OVER THE COUNTER MEDICATION probiotic  . pantoprazole (PROTONIX) 40 MG tablet TAKE ONE TABLET BY MOUTH  DAILY FOR TREATMENT FOR THE ULCER.  . SYMBICORT 80-4.5 MCG/ACT inhaler INHALE 2 PUFFS INTO THE LUNGS TWICE DAILY.  [name] tiotropium (SPIRIVA HANDIHALER) 18 MCG inhalation capsule INHALE 1 PUFF INTO THE LUNGS DAILY   Facility-Administered Encounter Medications as of 10/20/2018  Medication  . [COMPLETED] cyanocobalamin ((VITAMIN B-12)) injection 1,000 mcg  . sodium chloride flush (NS) 0.9 % injection 10 mL    ALLERGIES:  No Known Allergies   PHYSICAL EXAM:  ECOG Performance status: 1  Vitals:   10/20/18 1419  BP: (!) 132/56  Pulse: 70  Resp: 16  Temp: 99.2 F (37.3 C)  SpO2: 98%   Filed Weights   10/20/18 1419  Weight: 100 lb (45.4 kg)    Physical Exam Constitutional:      Appearance: Normal appearance. She is normal weight.  Cardiovascular:     Rate and Rhythm: Normal rate and regular rhythm.     Heart sounds: Normal heart sounds.  Pulmonary:     Effort: Pulmonary effort is normal.     Breath sounds: Normal breath sounds.  Musculoskeletal: Normal range of motion.  Skin:    General: Skin is warm and dry.  Neurological:     Mental Status: She is alert and oriented to person, place, and time. Mental status is at baseline.  Psychiatric:        Mood and Affect: Mood normal.         Behavior: Behavior normal.        Thought Content: Thought content normal.        Judgment: Judgment normal.      LABORATORY DATA:  I have reviewed the labs as listed.  CBC    Component Value Date/Time   WBC 5.7 10/15/2018 1130   RBC 4.01 10/15/2018 1130   HGB 11.9 (L) 10/15/2018 1130   HGB 12.3 04/03/2018 1002   HCT 39.4 10/15/2018 1130   HCT 40.5 04/03/2018 1002   PLT 220 10/15/2018 1130   PLT 278 04/03/2018 1002   MCV 98.3 10/15/2018 1130   MCV 98 (H) 04/03/2018 1002   MCH 29.7 10/15/2018 1130   MCHC 30.2 10/15/2018 1130   RDW 13.1 10/15/2018 1130   RDW 14.2 04/03/2018 1002   LYMPHSABS 1.2 10/15/2018 1130   LYMPHSABS 1.3 04/03/2018 1002   MONOABS 0.5 10/15/2018 1130   EOSABS 0.1 10/15/2018 1130   EOSABS 0.1 04/03/2018 1002   BASOSABS 0.1 10/15/2018 1130   BASOSABS 0.0 04/03/2018 1002   CMP Latest Ref Rng & Units 10/01/2018 06/30/2018 01/27/2018  Glucose 70 - 99 mg/dL 104(H) 105(H) 120(H)  BUN 8 - 23 mg/dL 13 9 16   Creatinine 0.44 - 1.00 mg/dL 0.69 0.81 0.79  Sodium 135 - 145 mmol/L 139 139 138  Potassium 3.5 - 5.1 mmol/L 4.0 4.1 3.8  Chloride 98 - 111 mmol/L 105 105 105  CO2 22 - 32 mmol/L 26 24 25   Calcium 8.9 - 10.3 mg/dL 9.8 9.7 10.2  Total Protein 6.5 - 8.1 g/dL 6.6 7.2 -  Total Bilirubin 0.3 - 1.2 mg/dL 0.9 1.0 -  Alkaline Phos 38 - 126 U/L 84 76 -  AST 15 - 41 U/L 18 21 -  ALT 0 - 44 U/L 14 17 -       DIAGNOSTIC IMAGING:  I have independently reviewed the scans and discussed with the patient.   I have reviewed Francene Finders, NP's note and agree with the documentation.  I personally performed a face-to-face visit, made revisions and my assessment and plan  is as follows.    ASSESSMENT & PLAN:   Iron deficiency anemia due to chronic blood loss 1.  Iron deficiency state secondary to chronic GI blood loss: -Etiology is multiple gastrointestinal AVMs resulting in chronic blood loss. -On intermittent Feraheme infusions, last infusion on 02/10/2018. -She  is seen with her son today.  Apparently her husband is in hospice.  She denied any bleeding per rectum.  Occasional dark stools were reported. -We reviewed her blood work.  Hemoglobin dropped 11.9.  B12 was borderline low at 261.  Ferritin also decreased to 62 from 120 previously. -I have recommended Feraheme infusion x2. -For her borderline low B12 levels, we will give a B12 injection today.  We will start her on B12 1 mg tablet daily. -We will see her back in 4 months with repeat blood work.       Orders placed this encounter:  Orders Placed This Encounter  Procedures  . CBC with Differential/Platelet  . Comprehensive metabolic panel  . Ferritin  . Iron and TIBC  . Vitamin B12  . Folate  . VITAMIN D 25 Hydroxy (Vit-D Deficiency, Fractures)      Derek Jack, MD [place] [phone]

## 2018-10-20 NOTE — Patient Instructions (Addendum)
[place] at [facility]  Discharge Instructions: Follow up in 4 months with labs prior Please start taking B12 oral pills. You may get these over the counter at any drug store.  You saw Dr. Delton Coombes today. _______________________________________________________________  Thank you for choosing [place] at [facility] to provide your oncology and hematology care.  To afford each patient quality time with our providers, please arrive at least 15 minutes before your scheduled appointment.  You need to re-schedule your appointment if you arrive 10 or more minutes late.  We strive to give you quality time with our providers, and arriving late affects you and other patients whose appointments are after yours.  Also, if you no show three or more times for appointments you may be dismissed from the clinic.  Again, thank you for choosing [place] at [place] hope is that these requests will allow you access to exceptional care and in a timely manner. _______________________________________________________________  If you have questions after your visit, please contact our office at [phone] between the hours of 8:30 a.m. and 5:00 p.m. Voicemails left after 4:30 p.m. will not be returned until the following business day. _______________________________________________________________  For prescription refill requests, have your pharmacy contact our office. _______________________________________________________________  Recommendations made by the consultant and any test results will be sent to your referring physician. _______________________________________________________________

## 2018-10-20 NOTE — Assessment & Plan Note (Signed)
1.  Iron deficiency state secondary to chronic GI blood loss: -Etiology is multiple gastrointestinal AVMs resulting in chronic blood loss. -On intermittent Feraheme infusions, last infusion on 02/10/2018. -She is seen with her son today.  Apparently her husband is in hospice.  She denied any bleeding per rectum.  Occasional dark stools were reported. -We reviewed her blood work.  Hemoglobin dropped 11.9.  B12 was borderline low at 261.  Ferritin also decreased to 62 from 120 previously. -I have recommended Feraheme infusion x2. -For her borderline low B12 levels, we will give a B12 injection today.  We will start her on B12 1 mg tablet daily. -We will see her back in 4 months with repeat blood work.

## 2018-10-28 ENCOUNTER — Other Ambulatory Visit: Payer: Self-pay

## 2018-10-28 ENCOUNTER — Inpatient Hospital Stay (HOSPITAL_COMMUNITY): Payer: Medicare Other | Attending: Hematology

## 2018-10-28 ENCOUNTER — Encounter (HOSPITAL_COMMUNITY): Payer: Self-pay

## 2018-10-28 VITALS — BP 117/62 | HR 67 | Temp 97.7°F | Resp 16

## 2018-10-28 DIAGNOSIS — K922 Gastrointestinal hemorrhage, unspecified: Secondary | ICD-10-CM | POA: Diagnosis present

## 2018-10-28 DIAGNOSIS — D500 Iron deficiency anemia secondary to blood loss (chronic): Secondary | ICD-10-CM | POA: Diagnosis present

## 2018-10-28 MED ORDER — SODIUM CHLORIDE 0.9 % IV SOLN
INTRAVENOUS | Status: DC
  Administered 2018-10-28: 09:00:00 via INTRAVENOUS

## 2018-10-28 MED ORDER — SODIUM CHLORIDE 0.9 % IV SOLN
510.0000 mg | Freq: Once | INTRAVENOUS | Status: AC
  Administered 2018-10-28: 510 mg via INTRAVENOUS
  Filled 2018-10-28: qty 17

## 2018-10-28 NOTE — Progress Notes (Signed)
Feraheme given today per MD orders. Tolerated infusion without adverse affects. Vital signs stable. No complaints at this time. Discharged from clinic via wheel chair accompanied by her son. F/U with [facility] as scheduled.

## 2018-10-28 NOTE — Patient Instructions (Signed)
[facility] at [facility]  Discharge Instructions:   _______________________________________________________________  Thank you for choosing [facility] at [facility] to provide your oncology and hematology care.  To afford each patient quality time with our providers, please arrive at least 15 minutes before your scheduled appointment.  You need to re-schedule your appointment if you arrive 10 or more minutes late.  We strive to give you quality time with our providers, and arriving late affects you and other patients whose appointments are after yours.  Also, if you no show three or more times for appointments you may be dismissed from the clinic.  Again, thank you for choosing [facility] at [facility]. Our hope is that these requests will allow you access to exceptional care and in a timely manner. _______________________________________________________________  If you have questions after your visit, please contact our office at [phone] between the hours of 8:30 a.m. and 5:00 p.m. Voicemails left after 4:30 p.m. will not be returned until the following business day. _______________________________________________________________  For prescription refill requests, have your pharmacy contact our office. _______________________________________________________________  Recommendations made by the consultant and any test results will be sent to your referring physician. _______________________________________________________________ 

## 2018-10-29 ENCOUNTER — Other Ambulatory Visit: Payer: Self-pay | Admitting: Family Medicine

## 2018-11-05 ENCOUNTER — Inpatient Hospital Stay (HOSPITAL_COMMUNITY): Payer: Medicare Other

## 2018-11-05 ENCOUNTER — Other Ambulatory Visit: Payer: Self-pay

## 2018-11-05 ENCOUNTER — Ambulatory Visit: Payer: Medicare Other | Admitting: Family Medicine

## 2018-11-05 ENCOUNTER — Encounter (HOSPITAL_COMMUNITY): Payer: Self-pay

## 2018-11-05 VITALS — BP 130/51 | HR 77 | Temp 98.0°F | Resp 16

## 2018-11-05 DIAGNOSIS — D500 Iron deficiency anemia secondary to blood loss (chronic): Secondary | ICD-10-CM

## 2018-11-05 DIAGNOSIS — K922 Gastrointestinal hemorrhage, unspecified: Secondary | ICD-10-CM | POA: Diagnosis not present

## 2018-11-05 MED ORDER — SODIUM CHLORIDE 0.9 % IV SOLN
510.0000 mg | Freq: Once | INTRAVENOUS | Status: AC
  Administered 2018-11-05: 510 mg via INTRAVENOUS
  Filled 2018-11-05: qty 17

## 2018-11-05 MED ORDER — SODIUM CHLORIDE 0.9 % IV SOLN
INTRAVENOUS | Status: DC
  Administered 2018-11-05 (×2): via INTRAVENOUS

## 2018-11-05 NOTE — Progress Notes (Signed)
Feraheme given today per MD orders. Tolerated infusion without adverse affects. Vital signs stable. No complaints at this time. Discharged from clinic ambulatory. F/U with [facility] as scheduled.  

## 2018-11-05 NOTE — Patient Instructions (Signed)
[facility] at [facility]  Discharge Instructions:   _______________________________________________________________  Thank you for choosing [facility] at [facility] to provide your oncology and hematology care.  To afford each patient quality time with our providers, please arrive at least 15 minutes before your scheduled appointment.  You need to re-schedule your appointment if you arrive 10 or more minutes late.  We strive to give you quality time with our providers, and arriving late affects you and other patients whose appointments are after yours.  Also, if you no show three or more times for appointments you may be dismissed from the clinic.  Again, thank you for choosing [facility] at [facility]. Our hope is that these requests will allow you access to exceptional care and in a timely manner. _______________________________________________________________  If you have questions after your visit, please contact our office at [phone] between the hours of 8:30 a.m. and 5:00 p.m. Voicemails left after 4:30 p.m. will not be returned until the following business day. _______________________________________________________________  For prescription refill requests, have your pharmacy contact our office. _______________________________________________________________  Recommendations made by the consultant and any test results will be sent to your referring physician. _______________________________________________________________ 

## 2018-11-12 ENCOUNTER — Other Ambulatory Visit: Payer: Self-pay

## 2018-11-12 ENCOUNTER — Ambulatory Visit: Payer: Medicare Other | Admitting: Family Medicine

## 2018-11-24 ENCOUNTER — Other Ambulatory Visit: Payer: Self-pay

## 2018-11-24 ENCOUNTER — Ambulatory Visit (INDEPENDENT_AMBULATORY_CARE_PROVIDER_SITE_OTHER): Payer: Medicare Other | Admitting: Family Medicine

## 2018-11-24 ENCOUNTER — Telehealth: Payer: Self-pay | Admitting: Family Medicine

## 2018-11-24 DIAGNOSIS — G4700 Insomnia, unspecified: Secondary | ICD-10-CM

## 2018-11-24 DIAGNOSIS — F439 Reaction to severe stress, unspecified: Secondary | ICD-10-CM | POA: Diagnosis not present

## 2018-11-24 DIAGNOSIS — F4321 Adjustment disorder with depressed mood: Secondary | ICD-10-CM

## 2018-11-24 MED ORDER — ALPRAZOLAM 0.25 MG PO TABS: ORAL_TABLET | ORAL | 4 refills | Status: DC

## 2018-11-24 NOTE — Telephone Encounter (Signed)
Pt's son [name] is calling in to see if something can be called in to help Carol Massey sleep. She is having trouble sleeping at night since Mr. Hush passed away. He has been giving her 1/2 a tablet of HYDROcodone she is about out of that medication. He would like either a refill on that or something else called in to  [place], [place]

## 2018-11-24 NOTE — Telephone Encounter (Signed)
Telephone visit scheduled with Dr Nicki Reaper.

## 2018-11-24 NOTE — Telephone Encounter (Signed)
Nurses Please get son on the line Let him know that I can do a virtual visit over the phone and help him out on this

## 2018-11-24 NOTE — Progress Notes (Signed)
   Subjective:    Patient ID: Carol Massey, female    DOB: 09-14-1925, 85 y.o.   MRN: [id number]  HPI  Son called to state his mother has been having a hard time sleeping since her husband died last week. Son states he has been giving her a half of hydrocodone at bed time and some nights it helps and sometimes it does not but he is almost out of medication and wonders what he should do.  Virtual Visit via Video Note Video was not available I connected with Carol Massey on 11/24/18 at  3:30 PM EDT by a video enabled telemedicine application and verified that I am speaking with the correct person using two identifiers.   I discussed the limitations of evaluation and management by telemedicine and the availability of in person appointments. The patient expressed understanding and agreed to proceed.  History of Present Illness: Patient having significant insomnia at nighttime mainly because of stress of losing her spouse She finds her self feeling restless I discussed the case with the son They had been giving her a half of a hydrocodone at night she is not having any pain That seems to help somewhat but they are running out of hydrocodone and did not know what they should do She does have some anxiousness and nervousness are associated with all of this but not being depressed She is eating relatively good keeping her weight up No falls or injuries   Observations/Objective:  Unable to do physical exam because of virtual visit Assessment and Plan:   Follow Up Instructions:    I discussed the assessment and treatment plan with the patient. The patient was provided an opportunity to ask questions and all were answered. The patient agreed with the plan and demonstrated an understanding of the instructions.   The patient was advised to call back or seek an in-person evaluation if the symptoms worsen or if the condition fails to improve as anticipated.  I provided 15 minutes of  non-face-to-face time during this encounter.   Sallee Lange, MD   Review of Systems     Objective:   Physical Exam        Assessment & Plan:  Insomnia Difficult issue It is certainly understandable have a difficult time sleeping because of the loss of her spouse but this causes issues for her as well as family so therefore we will try 0.25 Xanax in the evening time currently they are giving a half a tablet in the morning to help her with her anxiousness They will try 1 at nighttime to see if this will help with sleep The family was cautioned that this can increase risk of accidental falls they state that they monitor her mother as much as possible at nighttime Therefore we will try 1 at night if that does not work well enough over the course of the next couple weeks they will let us know and potentially step it up to 2 at nighttime but preferably we will stick with just 1 at night The son is to give [place] an update within the next 10 to 14 days

## 2018-11-25 ENCOUNTER — Other Ambulatory Visit: Payer: Self-pay | Admitting: Family Medicine

## 2018-12-16 ENCOUNTER — Ambulatory Visit (HOSPITAL_COMMUNITY)
Admission: RE | Admit: 2018-12-16 | Discharge: 2018-12-16 | Disposition: A | Discharge status: Home / Self Care | Payer: Medicare Other | Source: Ambulatory Visit | Attending: Family Medicine | Admitting: Family Medicine

## 2018-12-16 ENCOUNTER — Other Ambulatory Visit: Payer: Self-pay

## 2018-12-16 ENCOUNTER — Ambulatory Visit (INDEPENDENT_AMBULATORY_CARE_PROVIDER_SITE_OTHER): Payer: Medicare Other | Admitting: Family Medicine

## 2018-12-16 DIAGNOSIS — M79661 Pain in right lower leg: Secondary | ICD-10-CM | POA: Insufficient documentation

## 2018-12-16 DIAGNOSIS — S51011A Laceration without foreign body of right elbow, initial encounter: Secondary | ICD-10-CM

## 2018-12-16 DIAGNOSIS — M79604 Pain in right leg: Secondary | ICD-10-CM | POA: Insufficient documentation

## 2018-12-16 DIAGNOSIS — M25551 Pain in right hip: Secondary | ICD-10-CM | POA: Diagnosis present

## 2018-12-16 DIAGNOSIS — M25561 Pain in right knee: Secondary | ICD-10-CM | POA: Diagnosis present

## 2018-12-16 DIAGNOSIS — S0990XA Unspecified injury of head, initial encounter: Secondary | ICD-10-CM | POA: Diagnosis not present

## 2018-12-16 DIAGNOSIS — S0003XA Contusion of scalp, initial encounter: Secondary | ICD-10-CM

## 2018-12-16 MED ORDER — MUPIROCIN 2 % EX OINT: TOPICAL_OINTMENT | CUTANEOUS | 2 refills | Status: DC

## 2018-12-16 NOTE — Progress Notes (Addendum)
Patient was seen in person She sustained a fall at home striking her head as well as her right hip and to have an skin tear in the arm Patient did not lose consciousness no nausea vomiting or diarrhea with this Overall energy level is okay been able to walk but complains of some soreness in the hip but no severe pain Family called here and we brought the patient to parking lot for an examination to lessen the risk of coronavirus exposure at the ER Patient not having any severe headache not having any vomiting or blurred vision or double vision no ataxia or unilateral weakness patient able to bear weight on her hip and patient had skin tear on her hand that has been treated by the son at home who is familiar with doing this  On examination she has a bruise on her scalp that seems to be going down her pupils responsive to light she is able to follow commands no obvious neurologic issues her lungs are clear respiratory rate normal heart is regular no murmurs the right hip has tenderness to the touch but she is able to flex and extend the leg she is able to internal and external rotation without having severe pain her right arm is bandaged  Right arm skin tear use Bactroban as needed  Right hip contusion hold off on x-rays if not improving over the next 48 hours family is to let us know we will do outpatient x-ray  Head injury-I do not feel the patient needs to have an emergency CAT scan at this point in time I do not feel she has good ER.  We did discuss warning signs to watch for if double vision nausea vomiting disorientation lethargy or other serious cognitive changes immediately notify us.  The son voices understanding  It should be noted that the son did call back she is now complaining of severe pain in the right hip right leg and right knee unable to put weight on it so therefore we will go ahead with x-ray     Established Patient Office Visit  Subjective:  Patient ID: Carol Massey, female     DOB: 1925/09/29  Age: 85 y.o. MRN: [id number]  CC: No chief complaint on file.   HPI Carol Massey presents for head contusion leg injury  Past Medical History:  Diagnosis Date  . Anemia   . Anemia, chronic disease 01/21/2018  . COPD (chronic obstructive pulmonary disease) ([place])   . Dieulafoy lesion of colon 2006  . Gastric AVM 2014  . Generalized anxiety disorder 07/24/2015  . Hard of hearing   . Hypertension   . Iron deficiency anemia due to chronic blood loss 05/27/2014  . Major depression in remission ([place]) 07/24/2015  . Osteoporosis   . PUD (peptic ulcer disease) 11/27/2015   Gastric and duodenal ulcer on EGD  . Stroke ([place])   . Unsteady gait   . Upper GI bleed 11/27/2015    Past Surgical History:  Procedure Laterality Date  . ABDOMINAL HYSTERECTOMY    . APPENDECTOMY    . COLONOSCOPY  03/2005   Dr. Gala Romney: rectal and rectosigmoid polyps removed  . COLONOSCOPY WITH ESOPHAGOGASTRODUODENOSCOPY (EGD) N/A 08/12/2013   Dr. Gala Romney: colonoscopy with tubular adenoma, colonic diverticulosis. EGD with non-critical Schatzki's ring, non-manipulated, and gastric AVM with evidence of recent bleeding, s/p thermal sealing and hemostasis clip  . ESOPHAGEAL DILATION  11/27/2015   Procedure: ESOPHAGEAL DILATION;  Surgeon: Daneil Dolin, MD;  Location[facility];  Service: Endoscopy;;  . ESOPHAGOGASTRODUODENOSCOPY  03/2005   Dr. Gala Romney: tiny distal esophageal erosions  . ESOPHAGOGASTRODUODENOSCOPY N/A 11/27/2015   RMR: Cricopharyngeus web, status post dilation. Nonbleeding duodenal and gastric ulcers without stigmata of bleeding. Gastric biopsies benign  . ESOPHAGOGASTRODUODENOSCOPY N/A 02/14/2016   Procedure: ESOPHAGOGASTRODUODENOSCOPY (EGD);  Surgeon: Daneil Dolin, MD;  Location: [facility];  Service: Endoscopy;  Laterality: N/A;  300  . GIVENS CAPSULE STUDY N/A 11/27/2015   NOT DONE, WAS NOT INDICATED AT THE TIME  . MALONEY DILATION N/A 02/14/2016   Procedure: Venia Minks DILATION;  Surgeon:  Daneil Dolin, MD;  Location: [facility];  Service: Endoscopy;  Laterality: N/A;  . small bowel capsule study  03/2005   Dr. Laural Golden: two small Dieulafoy lesions at ascending colon. tiney specks of blood at duodenal bulb but no identified lesions  . TONSILLECTOMY      Family History  Problem Relation Age of Onset  . Hypertension Mother   . Stroke Mother   . Leukemia Father 34  . Colon cancer Neg Hx     Social History   Socioeconomic History  . Marital status: Married    Spouse name: Not on file  . Number of children: 3  . Years of education: Not on file  . Highest education level: Not on file  Occupational History  . Occupation: retired from [employer]  . Financial resource strain: Not hard at all  . Food insecurity:    Worry: Never true    Inability: Never true  . Transportation needs:    Medical: No    Non-medical: No  Tobacco Use  . Smoking status: Former Smoker    Packs/day: 0.50    Years: 70.00    Pack years: 35.00    Types: Cigarettes    Last attempt to quit: 08/26/2014    Years since quitting: 4.3  . Smokeless tobacco: Never Used  . Tobacco comment: about 1/2 ppd  Substance and Sexual Activity  . Alcohol use: No    Alcohol/week: 0.0 standard drinks  . Drug use: No  . Sexual activity: Not on file    Comment: married  Lifestyle  . Physical activity:    Days per week: 0 days    Minutes per session: 0 min  . Stress: Not at all  Relationships  . Social connections:    Talks on phone: More than three times a week    Gets together: More than three times a week    Attends religious service: More than 4 times per year    Active member of club or organization: Yes    Attends meetings of clubs or organizations: 1 to 4 times per year    Relationship status: Married  . Intimate partner violence:    Fear of current or ex partner: No    Emotionally abused: No    Physically abused: No    Forced sexual activity: No  Other Topics Concern  . Not  on file  Social History Narrative   Lives with husband and son   Caffeine use: 1 cup a day of coffee   Diet coke- 16oz daily   Right handed    Outpatient Medications Prior to Visit  Medication Sig Dispense Refill  . acetaminophen (TYLENOL) 500 MG tablet Take 500 mg by mouth daily.     [name] albuterol (PROVENTIL HFA;VENTOLIN HFA) 108 (90 Base) MCG/ACT inhaler INHALE 2 PUFFS INTO THE LUNGS EVERY 4 HOURS AS NEEDED FOR  WHEEZING OR SHORTNESS OF BREATH. 8.5 Inhaler 4  . ALPRAZolam (XANAX) 0.25 MG tablet 1/2 qam and 1 to 2 qhs prn sleep 45 tablet 4  . amLODipine (NORVASC) 2.5 MG tablet TAKE 1 TABLET BY MOUTH ONCE A DAY. 90 tablet 0  . Ascorbic Acid (VITAMIN C PLUS WILD ROSE HIPS PO) Take 1 tablet by mouth daily.    . Calcium Carb-Cholecalciferol (CALCIUM + D3 PO) Take 1 tablet by mouth daily.     . citalopram (CELEXA) 10 MG tablet Take 10 mg by mouth every day 90 tablet 2  . HYDROcodone-acetaminophen (NORCO/VICODIN) 5-325 MG tablet Take 1 tablet by mouth every 6 (six) hours as needed for moderate pain.    [name] KLOR-CON M20 20 MEQ tablet TAKE 1 TABLET BY MOUTH TWICE A DAY 180 tablet 1  . OVER THE COUNTER MEDICATION probiotic    . pantoprazole (PROTONIX) 40 MG tablet TAKE ONE TABLET BY MOUTH DAILY FOR TREATMENT FOR THE ULCER. 90 tablet 0  . SYMBICORT 80-4.5 MCG/ACT inhaler INHALE 2 PUFFS INTO THE LUNGS TWICE DAILY. 30.6 g 0  . tiotropium (SPIRIVA HANDIHALER) 18 MCG inhalation capsule INHALE 1 PUFF INTO THE LUNGS DAILY 90 capsule 0   Facility-Administered Medications Prior to Visit  Medication Dose Route Frequency Provider Last Rate Last Dose  . sodium chloride flush (NS) 0.9 % injection 10 mL  10 mL Intracatheter PRN Kefalas, Manon Hilding, PA-C        No Known Allergies  ROS Review of Systems    Objective:    Physical Exam  LMP  (LMP Unknown)  Wt Readings from Last 3 Encounters:  10/20/18 100 lb (45.4 kg)  07/31/18 107 lb (48.5 kg)  07/07/18 108 lb 4.8 oz (49.1 kg)     There are no  preventive care reminders to display for this patient.  There are no preventive care reminders to display for this patient.  No results found for: TSH Lab Results  Component Value Date   WBC 5.7 10/15/2018   HGB 11.9 (L) 10/15/2018   HCT 39.4 10/15/2018   MCV 98.3 10/15/2018   PLT 220 10/15/2018   Lab Results  Component Value Date   NA 139 10/01/2018   K 4.0 10/01/2018   CO2 26 10/01/2018   GLUCOSE 104 (H) 10/01/2018   BUN 13 10/01/2018   CREATININE 0.69 10/01/2018   BILITOT 0.9 10/01/2018   ALKPHOS 84 10/01/2018   AST 18 10/01/2018   ALT 14 10/01/2018   PROT 6.6 10/01/2018   ALBUMIN 4.0 10/01/2018   CALCIUM 9.8 10/01/2018   ANIONGAP 8 10/01/2018   Lab Results  Component Value Date   CHOL 183 12/03/2014   Lab Results  Component Value Date   HDL 88 12/03/2014   Lab Results  Component Value Date   LDLCALC 79 12/03/2014   Lab Results  Component Value Date   TRIG 81 12/03/2014   Lab Results  Component Value Date   CHOLHDL 2.1 12/03/2014   No results found for: HGBA1C    Assessment & Plan:   Problem List Items Addressed This Visit    None    Visit Diagnoses    Contusion of scalp, initial encounter    -  Primary   Right hip pain       Skin tear of right elbow without complication, initial encounter       Injury of head, initial encounter       Acute pain of right knee  Meds ordered this encounter  Medications  . mupirocin ointment (BACTROBAN) 2 %    Sig: Apply to affected area 1 times daily    Dispense:  22 g    Refill:  2    Follow-up: No follow-ups on file.    Sallee Lange, MD

## 2018-12-16 NOTE — Addendum Note (Signed)
Addended by: Dairl Ponder on: 12/16/2018 03:17 PM   Modules accepted: Orders

## 2018-12-18 ENCOUNTER — Other Ambulatory Visit: Payer: Self-pay | Admitting: Family Medicine

## 2018-12-18 ENCOUNTER — Telehealth: Payer: Self-pay | Admitting: Family Medicine

## 2018-12-18 MED ORDER — HYDROCODONE-ACETAMINOPHEN 5-325 MG PO TABS: ORAL_TABLET | ORAL | 0 refills | Status: DC

## 2018-12-18 NOTE — Telephone Encounter (Signed)
Prescription sent as requested.

## 2018-12-18 NOTE — Telephone Encounter (Signed)
Son(Michael) states patient's leg is better but needs refill on hydrocodone called into [place]

## 2018-12-18 NOTE — Telephone Encounter (Signed)
Son(DPR) notified

## 2018-12-23 ENCOUNTER — Ambulatory Visit (INDEPENDENT_AMBULATORY_CARE_PROVIDER_SITE_OTHER): Payer: Medicare Other | Admitting: Family Medicine

## 2018-12-23 ENCOUNTER — Other Ambulatory Visit: Payer: Self-pay

## 2018-12-23 DIAGNOSIS — M25551 Pain in right hip: Secondary | ICD-10-CM | POA: Diagnosis not present

## 2018-12-23 MED ORDER — HYDROCODONE-ACETAMINOPHEN 5-325 MG PO TABS: ORAL_TABLET | ORAL | 0 refills | Status: DC

## 2018-12-23 NOTE — Progress Notes (Signed)
   Subjective:    Patient ID: Carol Massey, female    DOB: 1926/06/20, 85 y.o.   MRN: [id number]  HPI Format-phone  Patient present at home Provider present at office Consent for interaction obtained Coronavirus outbreak made virtual visit necessary   Son calls and states his mother is doing worse since her fall.  She jerks in pain in her legs and it keeps wanting to give way on her. He also is concerned she is having a worsening of her neurological symptoms since her fall. She is shaking and having tremors a lot mores since her most recent fall and is having spasms in her legs that cause her to jerk. She sustained a fall since then has been having right hip pain sometimes worse sometimes not as good having difficult time with it family is concerned patient is willing to get further evaluation if necessary is able to bear weight but is somewhat wobbly has not had any additional falls no mentation changes hydrocodone does seem to help but they need more Virtual Visit via Video Note  I connected with Carol Massey on 12/23/18 at  3:30 PM EDT by a video enabled telemedicine application and verified that I am speaking with the correct person using two identifiers. The  I discussed the limitations of evaluation and management by telemedicine and the availability of in person appointments. The patient expressed understanding and agreed to proceed.  History of Present Illness:    Observations/Objective:   Assessment and Plan:   Follow Up Instructions:    I discussed the assessment and treatment plan with the patient. The patient was provided an opportunity to ask questions and all were answered. The patient agreed with the plan and demonstrated an understanding of the instructions.   The patient was advised to call back or seek an in-person evaluation if the symptoms worsen or if the condition fails to improve as anticipated.  I provided 15 minutes of non-face-to-face time during this  encounter.      Review of Systems     Objective:   Physical Exam        Assessment & Plan:  Right hip pain-we will try more hydrocodone and stretching exercises along with warm compress if this does not do enough next step would be doing a scan of the right hip.  Given her advanced age physical therapy would be helpful but right now during coronavirus it would not be advisable  Some of the muscle spasms she is having the best approach will be stretching warm compresses and gentle massage  Her son who does a good job taking care of her will give [place] an update early next week on how she is doing then we can make decisions regarding further interventions

## 2018-12-25 ENCOUNTER — Telehealth: Payer: Self-pay | Admitting: Family Medicine

## 2018-12-25 NOTE — Telephone Encounter (Signed)
Pt son contacted office to inform provider that patient right leg is not getting any better. Son states that there is a little swelling, pain in thigh area that goes down to knee area, and pt is unable to put any weight on leg. Pt son states that pt has tried Hydrocodone, stretches, and heating pad but no relief. Pt son states that provider wanted him to give him a call with an update and that the next step would be a MRI. Please advise. Thank you

## 2018-12-25 NOTE — Telephone Encounter (Signed)
Dr Arva Chafe will see this note monday

## 2018-12-25 NOTE — Telephone Encounter (Signed)
Contacted son and informed son that provider will see note on Monday and we would call Monday morning with further instructions. Son verbalized understanding.

## 2018-12-27 ENCOUNTER — Emergency Department (HOSPITAL_COMMUNITY): Payer: Medicare Other

## 2018-12-27 ENCOUNTER — Inpatient Hospital Stay (HOSPITAL_COMMUNITY)
Admission: EM | Admit: 2018-12-27 | Discharge: 2018-12-31 | DRG: 481 | Disposition: A | Discharge status: Home Health | Payer: Medicare Other | Attending: Internal Medicine | Admitting: Internal Medicine

## 2018-12-27 ENCOUNTER — Encounter (HOSPITAL_COMMUNITY): Payer: Self-pay | Admitting: Emergency Medicine

## 2018-12-27 ENCOUNTER — Other Ambulatory Visit: Payer: Self-pay

## 2018-12-27 DIAGNOSIS — Z8249 Family history of ischemic heart disease and other diseases of the circulatory system: Secondary | ICD-10-CM | POA: Diagnosis not present

## 2018-12-27 DIAGNOSIS — S72141A Displaced intertrochanteric fracture of right femur, initial encounter for closed fracture: Secondary | ICD-10-CM | POA: Diagnosis present

## 2018-12-27 DIAGNOSIS — S72001A Fracture of unspecified part of neck of right femur, initial encounter for closed fracture: Secondary | ICD-10-CM

## 2018-12-27 DIAGNOSIS — Z8673 Personal history of transient ischemic attack (TIA), and cerebral infarction without residual deficits: Secondary | ICD-10-CM

## 2018-12-27 DIAGNOSIS — K219 Gastro-esophageal reflux disease without esophagitis: Secondary | ICD-10-CM

## 2018-12-27 DIAGNOSIS — E785 Hyperlipidemia, unspecified: Secondary | ICD-10-CM | POA: Diagnosis present

## 2018-12-27 DIAGNOSIS — W1830XA Fall on same level, unspecified, initial encounter: Secondary | ICD-10-CM | POA: Diagnosis present

## 2018-12-27 DIAGNOSIS — F418 Other specified anxiety disorders: Secondary | ICD-10-CM | POA: Diagnosis not present

## 2018-12-27 DIAGNOSIS — D638 Anemia in other chronic diseases classified elsewhere: Secondary | ICD-10-CM | POA: Diagnosis present

## 2018-12-27 DIAGNOSIS — Z66 Do not resuscitate: Secondary | ICD-10-CM | POA: Diagnosis present

## 2018-12-27 DIAGNOSIS — Z7951 Long term (current) use of inhaled steroids: Secondary | ICD-10-CM

## 2018-12-27 DIAGNOSIS — F325 Major depressive disorder, single episode, in full remission: Secondary | ICD-10-CM | POA: Diagnosis present

## 2018-12-27 DIAGNOSIS — Z90710 Acquired absence of both cervix and uterus: Secondary | ICD-10-CM | POA: Diagnosis not present

## 2018-12-27 DIAGNOSIS — J431 Panlobular emphysema: Secondary | ICD-10-CM | POA: Diagnosis not present

## 2018-12-27 DIAGNOSIS — Z87891 Personal history of nicotine dependence: Secondary | ICD-10-CM

## 2018-12-27 DIAGNOSIS — D62 Acute posthemorrhagic anemia: Secondary | ICD-10-CM | POA: Diagnosis not present

## 2018-12-27 DIAGNOSIS — F411 Generalized anxiety disorder: Secondary | ICD-10-CM | POA: Diagnosis present

## 2018-12-27 DIAGNOSIS — Z823 Family history of stroke: Secondary | ICD-10-CM

## 2018-12-27 DIAGNOSIS — M810 Age-related osteoporosis without current pathological fracture: Secondary | ICD-10-CM | POA: Diagnosis present

## 2018-12-27 DIAGNOSIS — J449 Chronic obstructive pulmonary disease, unspecified: Secondary | ICD-10-CM

## 2018-12-27 DIAGNOSIS — Z8711 Personal history of peptic ulcer disease: Secondary | ICD-10-CM | POA: Diagnosis not present

## 2018-12-27 DIAGNOSIS — I10 Essential (primary) hypertension: Secondary | ICD-10-CM

## 2018-12-27 DIAGNOSIS — S72141P Displaced intertrochanteric fracture of right femur, subsequent encounter for closed fracture with malunion: Secondary | ICD-10-CM | POA: Diagnosis not present

## 2018-12-27 DIAGNOSIS — S72143A Displaced intertrochanteric fracture of unspecified femur, initial encounter for closed fracture: Secondary | ICD-10-CM

## 2018-12-27 DIAGNOSIS — H9190 Unspecified hearing loss, unspecified ear: Secondary | ICD-10-CM | POA: Diagnosis present

## 2018-12-27 DIAGNOSIS — S72009A Fracture of unspecified part of neck of unspecified femur, initial encounter for closed fracture: Secondary | ICD-10-CM

## 2018-12-27 DIAGNOSIS — Z1159 Encounter for screening for other viral diseases: Secondary | ICD-10-CM

## 2018-12-27 DIAGNOSIS — Z79899 Other long term (current) drug therapy: Secondary | ICD-10-CM

## 2018-12-27 LAB — CBC WITH DIFFERENTIAL/PLATELET
Abs Immature Granulocytes: 0.05 10*3/uL (ref 0.00–0.07)
Basophils Absolute: 0.1 10*3/uL (ref 0.0–0.1)
Basophils Relative: 1 %
Eosinophils Absolute: 0 10*3/uL (ref 0.0–0.5)
Eosinophils Relative: 0 %
HCT: 39.7 % (ref 36.0–46.0)
Hemoglobin: 13.2 g/dL (ref 12.0–15.0)
Immature Granulocytes: 0 %
Lymphocytes Relative: 4 %
Lymphs Abs: 0.6 10*3/uL — ABNORMAL LOW (ref 0.7–4.0)
MCH: 31.8 pg (ref 26.0–34.0)
MCHC: 33.2 g/dL (ref 30.0–36.0)
MCV: 95.7 fL (ref 80.0–100.0)
Monocytes Absolute: 1.1 10*3/uL — ABNORMAL HIGH (ref 0.1–1.0)
Monocytes Relative: 8 %
Neutro Abs: 12.1 10*3/uL — ABNORMAL HIGH (ref 1.7–7.7)
Neutrophils Relative %: 87 %
Platelets: 249 10*3/uL (ref 150–400)
RBC: 4.15 MIL/uL (ref 3.87–5.11)
RDW: 13.6 % (ref 11.5–15.5)
WBC: 14 10*3/uL — ABNORMAL HIGH (ref 4.0–10.5)
nRBC: 0 % (ref 0.0–0.2)

## 2018-12-27 LAB — TYPE AND SCREEN
ABO/RH(D): O POS
Antibody Screen: NEGATIVE

## 2018-12-27 LAB — PROTIME-INR
INR: 1 (ref 0.8–1.2)
Prothrombin Time: 13.4 seconds (ref 11.4–15.2)

## 2018-12-27 LAB — BASIC METABOLIC PANEL
Anion gap: 15 (ref 5–15)
BUN: 17 mg/dL (ref 8–23)
CO2: 21 mmol/L — ABNORMAL LOW (ref 22–32)
Calcium: 9.7 mg/dL (ref 8.9–10.3)
Chloride: 103 mmol/L (ref 98–111)
Creatinine, Ser: 0.69 mg/dL (ref 0.44–1.00)
GFR calc Af Amer: >60 mL/min (ref >60)
GFR calc non Af Amer: >60 mL/min (ref >60)
Glucose, Bld: 150 mg/dL — ABNORMAL HIGH (ref 70–99)
Potassium: 3.9 mmol/L (ref 3.5–5.1)
Sodium: 139 mmol/L (ref 135–145)

## 2018-12-27 LAB — SARS CORONAVIRUS 2 BY RT PCR (HOSPITAL ORDER, PERFORMED IN ~~LOC~~ HOSPITAL LAB): SARS Coronavirus 2: NEGATIVE

## 2018-12-27 MED ORDER — UMECLIDINIUM BROMIDE 62.5 MCG/INH IN AEPB
1.0000 | INHALATION_SPRAY | Freq: Every day | RESPIRATORY_TRACT | Status: DC
  Administered 2018-12-28 – 2018-12-31 (×3): 1 via RESPIRATORY_TRACT
  Filled 2018-12-27 (×2): qty 7

## 2018-12-27 MED ORDER — ACETAMINOPHEN 500 MG PO TABS
500.0000 mg | ORAL_TABLET | Freq: Every day | ORAL | Status: DC
  Administered 2018-12-27 – 2018-12-28 (×2): 500 mg via ORAL
  Filled 2018-12-27 (×3): qty 1

## 2018-12-27 MED ORDER — HEPARIN SODIUM (PORCINE) 5000 UNIT/ML IJ SOLN
5000.0000 [IU] | Freq: Three times a day (TID) | INTRAMUSCULAR | Status: DC
  Administered 2018-12-27 – 2018-12-30 (×7): 5000 [IU] via SUBCUTANEOUS
  Filled 2018-12-27 (×7): qty 1

## 2018-12-27 MED ORDER — ONDANSETRON HCL 4 MG/2ML IJ SOLN: 4.0000 mg | Freq: Four times a day (QID) | INTRAMUSCULAR | Status: DC | PRN

## 2018-12-27 MED ORDER — BUDESONIDE 0.25 MG/2ML IN SUSP
0.2500 mg | Freq: Two times a day (BID) | RESPIRATORY_TRACT | Status: DC
  Administered 2018-12-27 – 2018-12-28 (×2): 0.25 mg via RESPIRATORY_TRACT
  Filled 2018-12-27 (×2): qty 2

## 2018-12-27 MED ORDER — PANTOPRAZOLE SODIUM 40 MG PO TBEC
40.0000 mg | DELAYED_RELEASE_TABLET | Freq: Every day | ORAL | Status: DC
  Administered 2018-12-27 – 2018-12-31 (×5): 40 mg via ORAL
  Filled 2018-12-27 (×6): qty 1

## 2018-12-27 MED ORDER — MORPHINE SULFATE (PF) 2 MG/ML IV SOLN
2.0000 mg | Freq: Once | INTRAVENOUS | Status: AC
  Administered 2018-12-27: 2 mg via INTRAVENOUS
  Filled 2018-12-27: qty 1

## 2018-12-27 MED ORDER — CITALOPRAM HYDROBROMIDE 20 MG PO TABS
10.0000 mg | ORAL_TABLET | Freq: Every day | ORAL | Status: DC
  Administered 2018-12-29 – 2018-12-31 (×3): 10 mg via ORAL
  Filled 2018-12-27 (×4): qty 1

## 2018-12-27 MED ORDER — METHOCARBAMOL 500 MG PO TABS
500.0000 mg | ORAL_TABLET | Freq: Four times a day (QID) | ORAL | Status: DC | PRN
  Administered 2018-12-30 – 2018-12-31 (×3): 500 mg via ORAL
  Filled 2018-12-27 (×3): qty 1

## 2018-12-27 MED ORDER — MORPHINE SULFATE (PF) 4 MG/ML IV SOLN
4.0000 mg | Freq: Once | INTRAVENOUS | Status: AC
  Administered 2018-12-27: 4 mg via INTRAVENOUS
  Filled 2018-12-27: qty 1

## 2018-12-27 MED ORDER — ALPRAZOLAM 0.25 MG PO TABS
0.2500 mg | ORAL_TABLET | Freq: Two times a day (BID) | ORAL | Status: DC | PRN
  Administered 2018-12-27: 0.25 mg via ORAL
  Filled 2018-12-27: qty 1

## 2018-12-27 MED ORDER — BISACODYL 10 MG RE SUPP: 10.0000 mg | Freq: Every day | RECTAL | Status: DC | PRN

## 2018-12-27 MED ORDER — ALBUTEROL SULFATE (2.5 MG/3ML) 0.083% IN NEBU: 2.5000 mg | INHALATION_SOLUTION | Freq: Four times a day (QID) | RESPIRATORY_TRACT | Status: DC | PRN

## 2018-12-27 MED ORDER — HYDROMORPHONE HCL 1 MG/ML IJ SOLN
0.5000 mg | INTRAMUSCULAR | Status: DC | PRN
  Administered 2018-12-27 – 2018-12-30 (×10): 0.5 mg via INTRAVENOUS
  Filled 2018-12-27 (×10): qty 0.5

## 2018-12-27 MED ORDER — METHOCARBAMOL 1000 MG/10ML IJ SOLN
500.0000 mg | Freq: Four times a day (QID) | INTRAVENOUS | Status: DC | PRN
  Administered 2018-12-28: 500 mg via INTRAVENOUS
  Filled 2018-12-27: qty 500
  Filled 2018-12-27: qty 5

## 2018-12-27 MED ORDER — AMLODIPINE BESYLATE 5 MG PO TABS
2.5000 mg | ORAL_TABLET | Freq: Every day | ORAL | Status: DC
  Administered 2018-12-27: 2.5 mg via ORAL
  Filled 2018-12-27: qty 1

## 2018-12-27 MED ORDER — OXYCODONE HCL 5 MG PO TABS
5.0000 mg | ORAL_TABLET | ORAL | Status: DC | PRN
  Administered 2018-12-27: 10 mg via ORAL
  Administered 2018-12-27 – 2018-12-29 (×3): 5 mg via ORAL
  Administered 2018-12-29 – 2018-12-31 (×6): 10 mg via ORAL
  Filled 2018-12-27: qty 2
  Filled 2018-12-27 (×2): qty 1
  Filled 2018-12-27 (×8): qty 2

## 2018-12-27 MED ORDER — TIOTROPIUM BROMIDE MONOHYDRATE 18 MCG IN CAPS: 18.0000 ug | ORAL_CAPSULE | Freq: Every day | RESPIRATORY_TRACT | Status: DC

## 2018-12-27 NOTE — ED Triage Notes (Signed)
Pt brought in by son from a fall 10 days ago.  Pt normally walks with a walker.  Has not been able to walk since the fall. Was seen here and had xrays done. Called pcp about 5 days ago and was rx hydrocodone 1/2 tablet q4h. Shortening and deformity to right hip noted.

## 2018-12-27 NOTE — H&P (Signed)
History and Physical    Carol Massey HYQ:[id number] DOB: June 24, 1926 DOA: 12/27/2018  Referring MD/NP/PA: Dr. Laverta [place] PCP: [name], MD  Patient coming from: Home  Chief Complaint: Right hip pain, related to walk.  HPI: Carol Massey is a 85 y.o. female with past medical history significant for iron deficiency anemia, hypertension, COPD, gastroesophageal reflux disease, depression, anxiety and osteoporosis; who presented to the emergency department secondary to right hip pain and inability to bear weight.  Apparently on April 22 patient experienced a mechanical fall that triggered her to visit primary care doctor and had imaging studies done demonstrated no acute osseous abnormalities; patient received pain medication and was discharged home with intention to reinitiate home health physical therapy.  Initially patient was able to bear weight using a walker but on April 29, the pain becomes worse and she was no longer able to bear any weight or tolerate the discomfort.  Patient was brought to the emergency department for further evaluation and management. Patient denies any further falls, chest pain, shortness of breath, nausea, vomiting, abdominal pain, dysuria, hematuria, cough, headaches, focal weakness, hematochezia or melena.  There has not been any sick contacts or any recent travels. In the ED and repeat x-rays of her hip demonstrated right hip comminuted fracture with mild displacement and angulation.  Chest x-ray is without acute cardiopulmonary process.  Stable hemoglobin level and a WBCs of 14,000.  Patient blood pressure was elevated and she was mildly tachycardic.  EDP has discussed with orthopedic doctor on call (Dr. Alvan Dame) who has recommended patient to be transferred to [facility] long hospital with anticipated surgical intervention on 12/28/2018 evening.  [facility] has been called to admit patient to the hospital for further evaluation and management.  Past Medical/Surgical History: Past Medical  History:  Diagnosis Date  . Anemia   . Anemia, chronic disease 01/21/2018  . COPD (chronic obstructive pulmonary disease) ([place])   . Dieulafoy lesion of colon 2006  . Gastric AVM 2014  . Generalized anxiety disorder 07/24/2015  . Hard of hearing   . Hypertension   . Iron deficiency anemia due to chronic blood loss 05/27/2014  . Major depression in remission ([place]) 07/24/2015  . Osteoporosis   . PUD (peptic ulcer disease) 11/27/2015   Gastric and duodenal ulcer on EGD  . Stroke ([place])   . Unsteady gait   . Upper GI bleed 11/27/2015    Past Surgical History:  Procedure Laterality Date  . ABDOMINAL HYSTERECTOMY    . APPENDECTOMY    . COLONOSCOPY  03/2005   Dr. Gala Romney: rectal and rectosigmoid polyps removed  . COLONOSCOPY WITH ESOPHAGOGASTRODUODENOSCOPY (EGD) N/A 08/12/2013   Dr. Gala Romney: colonoscopy with tubular adenoma, colonic diverticulosis. EGD with non-critical Schatzki's ring, non-manipulated, and gastric AVM with evidence of recent bleeding, s/p thermal sealing and hemostasis clip  . ESOPHAGEAL DILATION  11/27/2015   Procedure: ESOPHAGEAL DILATION;  Surgeon: Daneil Dolin, MD;  [facility];  Service: Endoscopy;;  . ESOPHAGOGASTRODUODENOSCOPY  03/2005   Dr. Gala Romney: tiny distal esophageal erosions  . ESOPHAGOGASTRODUODENOSCOPY N/A 11/27/2015   RMR: Cricopharyngeus web, status post dilation. Nonbleeding duodenal and gastric ulcers without stigmata of bleeding. Gastric biopsies benign  . ESOPHAGOGASTRODUODENOSCOPY N/A 02/14/2016   Procedure: ESOPHAGOGASTRODUODENOSCOPY (EGD);  Surgeon: Daneil Dolin, MD;  [facility];  Service: Endoscopy;  Laterality: N/A;  300  . GIVENS CAPSULE STUDY N/A 11/27/2015   NOT DONE, WAS NOT INDICATED AT THE TIME  . MALONEY DILATION N/A 02/14/2016  Procedure: MALONEY DILATION;  Surgeon: Daneil Dolin, MD;  [facility];  Service: Endoscopy;  Laterality: N/A;  . small bowel capsule study  03/2005   Dr. Laural Golden: two small Dieulafoy  lesions at ascending colon. tiney specks of blood at duodenal bulb but no identified lesions  . TONSILLECTOMY      Social History:  reports that she quit smoking about 4 years ago. Her smoking use included cigarettes. She has a 35.00 pack-year smoking history. She has never used smokeless tobacco. She reports that she does not drink alcohol or use drugs.  Allergies: No Known Allergies  Family History:  Family History  Problem Relation Age of Onset  . Hypertension Mother   . Stroke Mother   . Leukemia Father 50  . Colon cancer Neg Hx     Prior to Admission medications   Medication Sig Start Date End Date Taking? Authorizing Provider  acetaminophen (TYLENOL) 500 MG tablet Take 500 mg by mouth daily.     [provider]  albuterol (PROVENTIL HFA;VENTOLIN HFA) 108 (90 Base) MCG/ACT inhaler INHALE 2 PUFFS INTO THE LUNGS EVERY 4 HOURS AS NEEDED FOR WHEEZING OR SHORTNESS OF BREATH. 02/09/18   [name], MD  ALPRAZolam Duanne Moron) 0.25 MG tablet 1/2 qam and 1 to 2 qhs prn sleep 11/24/18   Sallee Lange A, MD  amLODipine (NORVASC) 2.5 MG tablet TAKE 1 TABLET BY MOUTH ONCE A DAY. 08/25/18   [name], MD  Ascorbic Acid (VITAMIN C PLUS WILD ROSE HIPS PO) Take 1 tablet by mouth daily.    [provider]  Calcium Carb-Cholecalciferol (CALCIUM + D3 PO) Take 1 tablet by mouth daily.     [provider]  citalopram (CELEXA) 10 MG tablet Take 10 mg by mouth every day 06/01/18   [name], MD  HYDROcodone-acetaminophen (NORCO/VICODIN) 5-325 MG tablet 1/2 tablet to 1 tablet every 4 hours as needed pain caution drowsiness not for frequent use 12/23/18   [name], MD  KLOR-CON M20 20 MEQ tablet TAKE 1 TABLET BY MOUTH TWICE A DAY 02/16/18   [name], MD  mupirocin ointment (BACTROBAN) 2 % Apply to affected area 1 times daily 12/16/18 12/16/19  [name], MD  OVER THE COUNTER MEDICATION probiotic    [provider]  pantoprazole (PROTONIX) 40 MG  tablet TAKE ONE TABLET BY MOUTH DAILY FOR TREATMENT FOR THE ULCER. 10/30/18   [name], MD  SYMBICORT 80-4.5 MCG/ACT inhaler INHALE 2 PUFFS INTO THE LUNGS TWICE DAILY. 11/25/18   [name], MD  tiotropium (SPIRIVA HANDIHALER) 18 MCG inhalation capsule INHALE 1 PUFF INTO THE LUNGS DAILY 08/21/18   [name], MD    Review of Systems:  Negative except as otherwise mentioned in HPI.   Physical Exam: Vitals:   12/27/18 1445 12/27/18 1500 12/27/18 1515 12/27/18 1530  BP:  (!) 155/83  (!) 147/84  Pulse: (!) 117 (!) 116 (!) 114 (!) 115  Resp: (!) 26 (!) 31 (!) 35 (!) 30  Temp:      TempSrc:      SpO2: 93% (!) 89% 93% 93%  Weight:      Height:        Constitutional: In mild distress secondary to right hip pain (after receiving pain medication), no chest pain, no shortness of breath, no nausea, no vomiting, no fever.   Eyes: PERRL, lids and conjunctivae normal, no icterus, no nystagmus. ENMT: Mucous membranes are moist. Posterior  pharynx clear of any exudate or lesions.  Neck: normal, supple, no masses, no thyromegaly, no JVD Respiratory: clear to auscultation bilaterally, no wheezing, no crackles. Normal respiratory effort. No accessory muscle use.  Cardiovascular: Sinus tachycardia, no rubs, no gallops, no murmurs; No extremity edema. 2+ pedal pulses. No carotid bruits.  Abdomen: no tenderness, no masses palpated. No hepatosplenomegaly. Bowel sounds positive.  Musculoskeletal: no clubbing / cyanosis.  Right lower extremity is externally deviated, tender to palpation around her right hip and with significant decrease range of motion.  On comparison to her left leg there is also mild reduction in length.  Normal muscle tone.  Skin: no rashes, lesions, ulcers. No induration Neurologic: CN 2-12 grossly intact. Sensation intact, DTR normal. Strength normal in all limbs except for right lower extremity which she is no attempting to move due to pain in her hip. Psychiatric: Normal  judgment and insight. Alert and oriented x 3. Normal mood.    Labs on Admission: I have personally reviewed the following labs and imaging studies  CBC: Recent Labs  Lab 12/27/18 1333  WBC 14.0*  NEUTROABS 12.1*  HGB 13.2  HCT 39.7  MCV 95.7  PLT 993   Basic Metabolic Panel: Recent Labs  Lab 12/27/18 1333  NA 139  K 3.9  CL 103  CO2 21*  GLUCOSE 150*  BUN 17  CREATININE 0.69  CALCIUM 9.7   GFR: Estimated Creatinine Clearance: 28.5 mL/min (by C-G formula based on SCr of 0.69 mg/dL).  Coagulation Profile: Recent Labs  Lab 12/27/18 1333  INR 1.0   Urine analysis:    Component Value Date/Time   COLORURINE COLORLESS (A) 01/17/2017 1645   APPEARANCEUR CLEAR 01/17/2017 1645   LABSPEC 1.004 (L) 01/17/2017 1645   PHURINE 7.0 01/17/2017 1645   GLUCOSEU NEGATIVE 01/17/2017 1645   HGBUR NEGATIVE 01/17/2017 1645   BILIRUBINUR NEGATIVE 01/17/2017 1645   KETONESUR NEGATIVE 01/17/2017 1645   PROTEINUR NEGATIVE 01/17/2017 1645   NITRITE NEGATIVE 01/17/2017 1645   LEUKOCYTESUR SMALL (A) 01/17/2017 1645   Radiological Exams on Admission: Dg Chest 1 View  Result Date: 12/27/2018 CLINICAL DATA:  Right hip fracture EXAM: CHEST  1 VIEW COMPARISON:  01/17/2017 FINDINGS: There is no focal parenchymal opacity. There is no pleural effusion or pneumothorax. The heart and mediastinal contours are unremarkable. There is a moderate size hiatal hernia. There is a dextroscoliosis of the thoracic spine. IMPRESSION: No active disease. Electronically Signed   By: Kathreen Devoid   On: 12/27/2018 13:43   Dg Hip Unilat W Or Wo Pelvis 2-3 Views Right  Result Date: 12/27/2018 CLINICAL DATA:  Right hip pain EXAM: DG HIP (WITH OR WITHOUT PELVIS) 2-3V RIGHT COMPARISON:  None. FINDINGS: Severe osteopenia. Comminuted and displaced right intertrochanteric fracture with apex superior angulation. No other fracture or dislocation. No aggressive osseous lesion. IMPRESSION: 1. Comminuted and displaced right  intertrochanteric fracture with apex superior angulation. Electronically Signed   By: Kathreen Devoid   On: 12/27/2018 13:41    EKG: Independently reviewed.  No acute ischemic changes, sinus tachycardia, normal axis and QT.  Assessment/Plan 1-close right hip fracture -Chest x-ray demonstrating comminuted hip fracture mildly displaced on the right side -Case discussed with Dr. Alvan Dame (orthopedic surgery) who recommended patient to be transferred to [facility] long hospital with anticipated surgical intervention on 12/28/2018 evening. -Patient will be better rest without weightbearing, will use as needed pain medication and will complete presurgical evaluation as per hip fracture protocol. -She is moderate to high risk from a  surgical standpoint, but after discussing with family given the significant decline in quality of life with ongoing hip fracture pain they agree to outweigh the risk for the potential benefit of better pain control and ability to bear weight. -Patient will be n.p.o. after midnight and for now we will use heparin for DVT prophylaxis.  2-hypertension -Blood pressure elevated at the moment of admission -Patient is presently taking her antihypertensive agents today and was also having significant pain -Will resume home antihypertensive regimen -Continue pain management for hip fracture -Follow vital signs and adjust treatment as needed.  3-GERD -Continue PPI  4-depression and/anxiety -Mood overall stable -No suicidal ideation or hallucinations -Continue the use of Celexa and as needed alprazolam.  5-COPD -Good oxygen saturation on room air -No complaints of shortness of breath or wheezing -Continue as needed albuterol -Continue Spiriva and Pulmicort.  6-leukocytosis -Appears to be in the setting of stress demargination -No source of acute infection appreciated; patient denies cough, shortness of breath, dysuria, hematuria or any other complaints. -Chest x-ray demonstrated no  acute cardiopulmonary process. -Hold on any antibiotics for now -Repeat CBC in a.m. to follow WBCs trend.    DVT prophylaxis: heparin  Code Status: DNR Family Communication: Case discussed with son over the phone, and he has provided authorization to keep his name and phone number available in the chart to be easily contacted Carol Massey [phone]). Disposition Plan: Given patient frailty I will anticipate discharge to skilled nursing facility after surgical intervention for rehabilitation and further care. Consults called: Orthopedic surgery (Dr. Alvan Dame) Admission status: Inpatient, length of stay more than 2 midnights; telemetry bed.   Time Spent: 70 minutes  Barton Dubois MD [facility] Pager [phone]  12/27/2018, 3:20 PM

## 2018-12-27 NOTE — ED Notes (Signed)
Pt's son can be reached at [phone]. His name is Carol Massey.

## 2018-12-27 NOTE — Progress Notes (Signed)
Noting patient's VS at [facility] and prior to arrival to room 1512. Pt with increased respirations, breathing heavy. O2 sats WNL on room air, and pt is on continuous pulse ox. Pt alert and oriented. Have given Xanax and pain medication. Will continue to closely monitor.

## 2018-12-27 NOTE — ED Provider Notes (Signed)
Emergency Department Provider Note   I have reviewed the triage vital signs and the nursing notes.   HISTORY  Chief Complaint Hip Pain   HPI Carol Massey is a 85 y.o. female with PMH of COPD, HTN, prior CVA, and anemia presents to the emergency department by private vehicle with worsening right hip pain.  Patient reports a fall approximately 10 days ago with the start of hip pain at that time.  She went to see her primary care doctor who saw her in person.  She had plain films of the hip, femur, knee, tib-fib which were unremarkable.  She was taking a low-dose hydrocodone, performing stretches, getting set up for physical therapy.  The patient states that today the pain worsened significantly.  According to the son, who I discussed the case with by phone, she was initially getting around okay using a walker but over the past 4 days she is been complaining of increased pain in the right hip.  She has not been able to walk over that time.  He denies any additional falls or other injuries.  She denies pain in any other location other than the hip.   Past Medical History:  Diagnosis Date  . Anemia   . Anemia, chronic disease 01/21/2018  . COPD (chronic obstructive pulmonary disease) ([place])   . Dieulafoy lesion of colon 2006  . Gastric AVM 2014  . Generalized anxiety disorder 07/24/2015  . Hard of hearing   . Hypertension   . Iron deficiency anemia due to chronic blood loss 05/27/2014  . Major depression in remission ([place]) 07/24/2015  . Osteoporosis   . PUD (peptic ulcer disease) 11/27/2015   Gastric and duodenal ulcer on EGD  . Stroke ([place])   . Unsteady gait   . Upper GI bleed 11/27/2015    Patient Active Problem List   Diagnosis Date Noted  . Closed right hip fracture, initial encounter ([place]) 12/27/2018  . Anemia, chronic disease 01/21/2018  . 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  . Essential hypertension 12/12/2016  . History of peptic ulcer disease   . Ataxia 12/01/2015  . PUD (peptic  ulcer disease)   . Melena 11/27/2015  . Upper GI bleed 11/27/2015  . Tobacco abuse 11/27/2015  . Azotemia 11/27/2015  . Right-sided chest pain 11/27/2015  . Dysphagia   . Generalized anxiety disorder 07/24/2015  . Major depression in remission ([place]) 07/24/2015  . Iron deficiency anemia due to chronic blood loss 05/27/2014  . Hyperlipemia 03/18/2013  . COPD (chronic obstructive pulmonary disease) ([place]) 01/12/2013  . Osteoporosis 01/12/2013    Past Surgical History:  Procedure Laterality Date  . ABDOMINAL HYSTERECTOMY    . APPENDECTOMY    . COLONOSCOPY  03/2005   Dr. Gala Romney: rectal and rectosigmoid polyps removed  . COLONOSCOPY WITH ESOPHAGOGASTRODUODENOSCOPY (EGD) N/A 08/12/2013   Dr. Gala Romney: colonoscopy with tubular adenoma, colonic diverticulosis. EGD with non-critical Schatzki's ring, non-manipulated, and gastric AVM with evidence of recent bleeding, s/p thermal sealing and hemostasis clip  . ESOPHAGEAL DILATION  11/27/2015   Procedure: ESOPHAGEAL DILATION;  Surgeon: Daneil Dolin, MD;  [facility];  Service: Endoscopy;;  . ESOPHAGOGASTRODUODENOSCOPY  03/2005   Dr. Gala Romney: tiny distal esophageal erosions  . ESOPHAGOGASTRODUODENOSCOPY N/A 11/27/2015   RMR: Cricopharyngeus web, status post dilation. Nonbleeding duodenal and gastric ulcers without stigmata of bleeding. Gastric biopsies benign  . ESOPHAGOGASTRODUODENOSCOPY N/A 02/14/2016   Procedure: ESOPHAGOGASTRODUODENOSCOPY (EGD);  Surgeon: Daneil Dolin, MD;  [facility];  Service: Endoscopy;  Laterality: N/A;  300  . GIVENS CAPSULE STUDY N/A 11/27/2015   NOT DONE, WAS NOT INDICATED AT THE TIME  . MALONEY DILATION N/A 02/14/2016   Procedure: Venia Minks DILATION;  Surgeon: Daneil Dolin, MD;  [facility];  Service: Endoscopy;  Laterality: N/A;  . small bowel capsule study  03/2005   Dr. Laural Golden: two small Dieulafoy lesions at ascending colon. tiney specks of blood at duodenal bulb but no identified lesions   . TONSILLECTOMY      Allergies Patient has no known allergies.  Family History  Problem Relation Age of Onset  . Hypertension Mother   . Stroke Mother   . Leukemia Father 75  . Colon cancer Neg Hx     Social History Social History   Tobacco Use  . Smoking status: Former Smoker    Packs/day: 0.50    Years: 70.00    Pack years: 35.00    Types: Cigarettes    Last attempt to quit: 08/26/2014    Years since quitting: 4.3  . Smokeless tobacco: Never Used  . Tobacco comment: about 1/2 ppd  Substance Use Topics  . Alcohol use: No    Alcohol/week: 0.0 standard drinks  . Drug use: No    Review of Systems  Constitutional: No fever/chills Eyes: No visual changes. ENT: No sore throat. Cardiovascular: Denies chest pain. Respiratory: Denies shortness of breath. Gastrointestinal: No abdominal pain.  No nausea, no vomiting.  No diarrhea.  No constipation. Genitourinary: Negative for dysuria. Musculoskeletal: Negative for back pain. Positive right hip pain.  Skin: Negative for rash. Neurological: Negative for headaches, focal weakness or numbness.  10-point ROS otherwise negative.  ____________________________________________   PHYSICAL EXAM:  VITAL SIGNS: ED Triage Vitals  Enc Vitals Group     BP 12/27/18 1224 (!) 191/86     Pulse Rate 12/27/18 1224 (!) 121     Resp 12/27/18 1224 18     Temp 12/27/18 1224 99.1 F (37.3 C)     Temp Source 12/27/18 1224 Oral     SpO2 12/27/18 1224 94 %     Weight 12/27/18 1222 100 lb (45.4 kg)     Height 12/27/18 1222 4\' 9"  (1.448 m)     Pain Score 12/27/18 1221 10   Constitutional: Alert and oriented. Well appearing and in no acute distress. Eyes: Conjunctivae are normal.  Head: Atraumatic. Nose: No congestion/rhinnorhea. Mouth/Throat: Mucous membranes are moist. Neck: No stridor.  No cervical spine tenderness to palpation. Cardiovascular: Normal rate, regular rhythm. Good peripheral circulation with 2+ DP/PT pulses on the  right. Grossly normal heart sounds.   Respiratory: Normal respiratory effort.  No retractions. Lungs CTAB. Gastrointestinal: Soft and nontender. No distention.  Musculoskeletal: Exquisite tenderness with any movement of the right hip.  There is swelling in the proximal, lateral hip without bruising.  No laceration.  No swelling or tenderness to palpation of the distal femur, knee, tib-fib, ankle.  Neurologic:  Normal speech and language. No gross focal neurologic deficits are appreciated. Normal sensation in the lower extremities.  Skin:  Skin is warm, dry and intact. No rash noted.  ____________________________________________   LABS (all labs ordered are listed, but only abnormal results are displayed)  Labs Reviewed  BASIC METABOLIC PANEL - Abnormal; Notable for the following components:      Result Value   CO2 21 (*)    Glucose, Bld 150 (*)    All other components within normal limits  CBC WITH DIFFERENTIAL/PLATELET - Abnormal; Notable for  the following components:   WBC 14.0 (*)    Neutro Abs 12.1 (*)    Lymphs Abs 0.6 (*)    Monocytes Absolute 1.1 (*)    All other components within normal limits  SARS CORONAVIRUS 2 (HOSPITAL ORDER, [place] [facility])  PROTIME-INR  TYPE AND SCREEN   ____________________________________________  EKG   EKG Interpretation  Date/Time:  Sunday Dec 27 2018 12:25:22 EDT Ventricular Rate:  119 PR Interval:    QRS Duration: 88 QT Interval:  318 QTC Calculation: 446 R Axis:   -4 Text Interpretation:  Sinus tachycardia Nonspecific ST changes.  No STEMI.  Confirmed by Nanda Quinton [phone]) on 12/27/2018 12:27:41 PM       ____________________________________________  RADIOLOGY  Dg Chest 1 View  Result Date: 12/27/2018 CLINICAL DATA:  Right hip fracture EXAM: CHEST  1 VIEW COMPARISON:  01/17/2017 FINDINGS: There is no focal parenchymal opacity. There is no pleural effusion or pneumothorax. The heart and mediastinal contours  are unremarkable. There is a moderate size hiatal hernia. There is a dextroscoliosis of the thoracic spine. IMPRESSION: No active disease. Electronically Signed   By: Kathreen Devoid   On: 12/27/2018 13:43   Dg Hip Unilat W Or Wo Pelvis 2-3 Views Right  Result Date: 12/27/2018 CLINICAL DATA:  Right hip pain EXAM: DG HIP (WITH OR WITHOUT PELVIS) 2-3V RIGHT COMPARISON:  None. FINDINGS: Severe osteopenia. Comminuted and displaced right intertrochanteric fracture with apex superior angulation. No other fracture or dislocation. No aggressive osseous lesion. IMPRESSION: 1. Comminuted and displaced right intertrochanteric fracture with apex superior angulation. Electronically Signed   By: Kathreen Devoid   On: 12/27/2018 13:41    ____________________________________________   PROCEDURES  Procedure(s) performed:   Procedures  None  ____________________________________________   INITIAL IMPRESSION / ASSESSMENT AND PLAN / ED COURSE  Pertinent labs & imaging results that were available during my care of the patient were reviewed by me and considered in my medical decision making (see chart for details).   Patient presents to the emergency department for evaluation of severe right hip pain.  She has been unable to walk since a fall on 4/22.  There is some lateral swelling near the hip.  The right leg is shortened and rotated.  Suspect fracture versus dislocation.  Patient has significant tachycardia and appears to be in pain.  I suspect that her tachycardia is related to this as opposed to other, unrelated process.  Plan for IV morphine, plain films of the right hip, reassess.  02:18 PM  Spoke with Dr. Alvan Dame regarding the case and x-ray findings. Requests Hospitalist admit to [facility]. They will consult in the AM. Likely surgery tomorrow evening pending clearance and scheduling. Ordered labs and COVID screening with admit plan. CXR without acute findings. Additional morphine given with continued pain.  Updated patient and son by phone.   Discussed patient's case with Hospitalist, Dr. Dyann Kief to request admission. Patient and family (if present) updated with plan. Care transferred to Hospitalist service.  I reviewed all nursing notes, vitals, pertinent old records, EKGs, labs, imaging (as available).  COVID testing result pending to be f/u by admit team.  ____________________________________________  FINAL CLINICAL IMPRESSION(S) / ED DIAGNOSES  Final diagnoses:  Closed fracture of right hip, initial encounter [facility])     MEDICATIONS GIVEN DURING THIS VISIT:  Medications  morphine 2 MG/ML injection 2 mg (2 mg Intravenous Given 12/27/18 1234)  morphine 4 MG/ML injection 4 mg (4 mg Intravenous Given 12/27/18 1434)  Note:  This document was prepared using Dragon voice recognition software and may include unintentional dictation errors.  Nanda Quinton, MD Emergency Medicine    Sashay Felling, [facility], MD 12/27/18 [id number]

## 2018-12-28 ENCOUNTER — Encounter (HOSPITAL_COMMUNITY)
Admission: EM | Disposition: A | Discharge status: Home Health | Payer: Self-pay | Source: Home / Self Care | Attending: Internal Medicine

## 2018-12-28 ENCOUNTER — Inpatient Hospital Stay (HOSPITAL_COMMUNITY): Payer: Medicare Other

## 2018-12-28 ENCOUNTER — Inpatient Hospital Stay (HOSPITAL_COMMUNITY): Payer: Medicare Other | Admitting: Anesthesiology

## 2018-12-28 ENCOUNTER — Encounter (HOSPITAL_COMMUNITY): Payer: Self-pay | Admitting: Certified Registered"

## 2018-12-28 DIAGNOSIS — S72141P Displaced intertrochanteric fracture of right femur, subsequent encounter for closed fracture with malunion: Secondary | ICD-10-CM

## 2018-12-28 DIAGNOSIS — F325 Major depressive disorder, single episode, in full remission: Secondary | ICD-10-CM

## 2018-12-28 DIAGNOSIS — S72143A Displaced intertrochanteric fracture of unspecified femur, initial encounter for closed fracture: Secondary | ICD-10-CM

## 2018-12-28 DIAGNOSIS — F411 Generalized anxiety disorder: Secondary | ICD-10-CM

## 2018-12-28 DIAGNOSIS — J431 Panlobular emphysema: Secondary | ICD-10-CM

## 2018-12-28 DIAGNOSIS — S72141A Displaced intertrochanteric fracture of right femur, initial encounter for closed fracture: Secondary | ICD-10-CM

## 2018-12-28 HISTORY — PX: INTRAMEDULLARY (IM) NAIL INTERTROCHANTERIC: SHX5875

## 2018-12-28 LAB — CBC
HCT: 37.9 % (ref 36.0–46.0)
Hemoglobin: 12.1 g/dL (ref 12.0–15.0)
MCH: 31.8 pg (ref 26.0–34.0)
MCHC: 31.9 g/dL (ref 30.0–36.0)
MCV: 99.7 fL (ref 80.0–100.0)
Platelets: 253 10*3/uL (ref 150–400)
RBC: 3.8 MIL/uL — ABNORMAL LOW (ref 3.87–5.11)
RDW: 13.8 % (ref 11.5–15.5)
WBC: 13.1 10*3/uL — ABNORMAL HIGH (ref 4.0–10.5)
nRBC: 0 % (ref 0.0–0.2)

## 2018-12-28 LAB — BASIC METABOLIC PANEL
Anion gap: 10 (ref 5–15)
BUN: 27 mg/dL — ABNORMAL HIGH (ref 8–23)
CO2: 23 mmol/L (ref 22–32)
Calcium: 9.6 mg/dL (ref 8.9–10.3)
Chloride: 106 mmol/L (ref 98–111)
Creatinine, Ser: 0.83 mg/dL (ref 0.44–1.00)
GFR calc Af Amer: >60 mL/min (ref >60)
GFR calc non Af Amer: >60 mL/min (ref >60)
Glucose, Bld: 134 mg/dL — ABNORMAL HIGH (ref 70–99)
Potassium: 4 mmol/L (ref 3.5–5.1)
Sodium: 139 mmol/L (ref 135–145)

## 2018-12-28 LAB — TYPE AND SCREEN
ABO/RH(D): O POS
Antibody Screen: NEGATIVE

## 2018-12-28 LAB — ABO/RH: ABO/RH(D): O POS

## 2018-12-28 SURGERY — FIXATION, FRACTURE, INTERTROCHANTERIC, WITH INTRAMEDULLARY ROD
Anesthesia: Spinal | Site: Hip | Laterality: Right

## 2018-12-28 MED ORDER — LIDOCAINE 2% (20 MG/ML) 5 ML SYRINGE
INTRAMUSCULAR | Status: DC | PRN
  Administered 2018-12-28 (×2): 20 mg via INTRAVENOUS

## 2018-12-28 MED ORDER — TRAMADOL HCL 50 MG PO TABS
50.0000 mg | ORAL_TABLET | Freq: Four times a day (QID) | ORAL | Status: DC
  Administered 2018-12-28 – 2018-12-29 (×3): 50 mg via ORAL
  Filled 2018-12-28 (×3): qty 1

## 2018-12-28 MED ORDER — CEFAZOLIN SODIUM-DEXTROSE 2-4 GM/100ML-% IV SOLN
2.0000 g | INTRAVENOUS | Status: AC
  Administered 2018-12-28: 13:00:00 2 g via INTRAVENOUS
  Filled 2018-12-28 (×2): qty 100

## 2018-12-28 MED ORDER — METOCLOPRAMIDE HCL 5 MG/ML IJ SOLN: 5.0000 mg | Freq: Three times a day (TID) | INTRAMUSCULAR | Status: DC | PRN

## 2018-12-28 MED ORDER — LACTATED RINGERS IV SOLN
INTRAVENOUS | Status: DC
  Administered 2018-12-28 (×2): via INTRAVENOUS

## 2018-12-28 MED ORDER — AMLODIPINE BESYLATE 5 MG PO TABS
2.5000 mg | ORAL_TABLET | Freq: Every day | ORAL | Status: DC
  Administered 2018-12-29 – 2018-12-31 (×3): 2.5 mg via ORAL
  Filled 2018-12-28 (×3): qty 1

## 2018-12-28 MED ORDER — CEFAZOLIN SODIUM-DEXTROSE 2-4 GM/100ML-% IV SOLN
2.0000 g | Freq: Four times a day (QID) | INTRAVENOUS | Status: AC
  Administered 2018-12-28 – 2018-12-29 (×2): 2 g via INTRAVENOUS
  Filled 2018-12-28 (×2): qty 100

## 2018-12-28 MED ORDER — ACETAMINOPHEN 325 MG PO TABS: 325.0000 mg | ORAL_TABLET | Freq: Four times a day (QID) | ORAL | Status: DC | PRN

## 2018-12-28 MED ORDER — FENTANYL CITRATE (PF) 100 MCG/2ML IJ SOLN
INTRAMUSCULAR | Status: AC
  Filled 2018-12-28: qty 2

## 2018-12-28 MED ORDER — PROPOFOL 500 MG/50ML IV EMUL
INTRAVENOUS | Status: DC | PRN
  Administered 2018-12-28: 25 ug/kg/min via INTRAVENOUS

## 2018-12-28 MED ORDER — LIDOCAINE 2% (20 MG/ML) 5 ML SYRINGE
INTRAMUSCULAR | Status: AC
  Filled 2018-12-28: qty 5

## 2018-12-28 MED ORDER — 0.9 % SODIUM CHLORIDE (POUR BTL) OPTIME
TOPICAL | Status: DC | PRN
  Administered 2018-12-28: 1000 mL

## 2018-12-28 MED ORDER — PROPOFOL 10 MG/ML IV BOLUS
INTRAVENOUS | Status: AC
  Filled 2018-12-28: qty 20

## 2018-12-28 MED ORDER — OXYCODONE HCL 5 MG PO TABS: 5.0000 mg | ORAL_TABLET | Freq: Once | ORAL | Status: DC | PRN

## 2018-12-28 MED ORDER — ONDANSETRON HCL 4 MG PO TABS: 4.0000 mg | ORAL_TABLET | Freq: Four times a day (QID) | ORAL | Status: DC | PRN

## 2018-12-28 MED ORDER — ADULT MULTIVITAMIN W/MINERALS CH
1.0000 | ORAL_TABLET | Freq: Every day | ORAL | Status: DC
  Administered 2018-12-29 – 2018-12-31 (×3): 1 via ORAL
  Filled 2018-12-28 (×3): qty 1

## 2018-12-28 MED ORDER — ONDANSETRON HCL 4 MG/2ML IJ SOLN: 4.0000 mg | Freq: Four times a day (QID) | INTRAMUSCULAR | Status: DC | PRN

## 2018-12-28 MED ORDER — HYDROCODONE-ACETAMINOPHEN 5-325 MG PO TABS: 1.0000 | ORAL_TABLET | ORAL | Status: DC | PRN

## 2018-12-28 MED ORDER — HYDROCODONE-ACETAMINOPHEN 7.5-325 MG PO TABS: 1.0000 | ORAL_TABLET | ORAL | Status: DC | PRN

## 2018-12-28 MED ORDER — ASPIRIN EC 81 MG PO TBEC
81.0000 mg | DELAYED_RELEASE_TABLET | Freq: Two times a day (BID) | ORAL | Status: DC
  Administered 2018-12-29 – 2018-12-31 (×5): 81 mg via ORAL
  Filled 2018-12-28 (×5): qty 1

## 2018-12-28 MED ORDER — SODIUM CHLORIDE 0.9 % IV SOLN
INTRAVENOUS | Status: DC | PRN
  Administered 2018-12-28: 14:00:00 25 ug/min via INTRAVENOUS

## 2018-12-28 MED ORDER — ENSURE ENLIVE PO LIQD
237.0000 mL | Freq: Two times a day (BID) | ORAL | Status: DC
  Administered 2018-12-29 – 2018-12-31 (×3): 237 mL via ORAL

## 2018-12-28 MED ORDER — ONDANSETRON HCL 4 MG/2ML IJ SOLN
INTRAMUSCULAR | Status: AC
  Filled 2018-12-28: qty 2

## 2018-12-28 MED ORDER — FERROUS SULFATE 325 (65 FE) MG PO TABS
325.0000 mg | ORAL_TABLET | Freq: Three times a day (TID) | ORAL | Status: DC
  Administered 2018-12-28 – 2018-12-31 (×8): 325 mg via ORAL
  Filled 2018-12-28 (×8): qty 1

## 2018-12-28 MED ORDER — PROMETHAZINE HCL 25 MG/ML IJ SOLN: 6.2500 mg | INTRAMUSCULAR | Status: DC | PRN

## 2018-12-28 MED ORDER — FENTANYL CITRATE (PF) 100 MCG/2ML IJ SOLN
INTRAMUSCULAR | Status: DC | PRN
  Administered 2018-12-28: 50 ug via INTRAVENOUS

## 2018-12-28 MED ORDER — ALUM & MAG HYDROXIDE-SIMETH 200-200-20 MG/5ML PO SUSP: 30.0000 mL | ORAL | Status: DC | PRN

## 2018-12-28 MED ORDER — BUDESONIDE-FORMOTEROL FUMARATE 80-4.5 MCG/ACT IN AERO
2.0000 | INHALATION_SPRAY | Freq: Two times a day (BID) | RESPIRATORY_TRACT | Status: DC
  Administered 2018-12-28 – 2018-12-31 (×6): 2 via RESPIRATORY_TRACT
  Filled 2018-12-28: qty 6.9

## 2018-12-28 MED ORDER — MORPHINE SULFATE (PF) 2 MG/ML IV SOLN: 0.5000 mg | INTRAVENOUS | Status: DC | PRN

## 2018-12-28 MED ORDER — PROPOFOL 10 MG/ML IV BOLUS
INTRAVENOUS | Status: DC | PRN
  Administered 2018-12-28: 30 mg via INTRAVENOUS
  Administered 2018-12-28: 10 mg via INTRAVENOUS

## 2018-12-28 MED ORDER — BUPIVACAINE IN DEXTROSE 0.75-8.25 % IT SOLN
INTRATHECAL | Status: DC | PRN
  Administered 2018-12-28: 1.6 mL via INTRATHECAL

## 2018-12-28 MED ORDER — METOCLOPRAMIDE HCL 5 MG PO TABS: 5.0000 mg | ORAL_TABLET | Freq: Three times a day (TID) | ORAL | Status: DC | PRN

## 2018-12-28 MED ORDER — FENTANYL CITRATE (PF) 100 MCG/2ML IJ SOLN: 25.0000 ug | INTRAMUSCULAR | Status: DC | PRN

## 2018-12-28 MED ORDER — PANTOPRAZOLE SODIUM 40 MG PO TBEC: 40.0000 mg | DELAYED_RELEASE_TABLET | Freq: Every day | ORAL | Status: DC

## 2018-12-28 MED ORDER — PHENYLEPHRINE HCL (PRESSORS) 10 MG/ML IV SOLN
INTRAVENOUS | Status: AC
  Filled 2018-12-28: qty 1

## 2018-12-28 MED ORDER — ACETAMINOPHEN 10 MG/ML IV SOLN: 1000.0000 mg | Freq: Once | INTRAVENOUS | Status: DC | PRN

## 2018-12-28 MED ORDER — NON FORMULARY: 2.0000 | Freq: Two times a day (BID) | Status: DC

## 2018-12-28 MED ORDER — PHENYLEPHRINE 40 MCG/ML (10ML) SYRINGE FOR IV PUSH (FOR BLOOD PRESSURE SUPPORT)
PREFILLED_SYRINGE | INTRAVENOUS | Status: DC | PRN
  Administered 2018-12-28 (×4): 80 ug via INTRAVENOUS

## 2018-12-28 MED ORDER — PHENYLEPHRINE 40 MCG/ML (10ML) SYRINGE FOR IV PUSH (FOR BLOOD PRESSURE SUPPORT)
PREFILLED_SYRINGE | INTRAVENOUS | Status: AC
  Filled 2018-12-28: qty 10

## 2018-12-28 MED ORDER — MENTHOL 3 MG MT LOZG: 1.0000 | LOZENGE | OROMUCOSAL | Status: DC | PRN

## 2018-12-28 MED ORDER — PHENOL 1.4 % MT LIQD: 1.0000 | OROMUCOSAL | Status: DC | PRN

## 2018-12-28 MED ORDER — ACETAMINOPHEN 500 MG PO TABS
500.0000 mg | ORAL_TABLET | Freq: Four times a day (QID) | ORAL | Status: DC
  Administered 2018-12-28 – 2018-12-29 (×3): 500 mg via ORAL
  Filled 2018-12-28 (×3): qty 1

## 2018-12-28 MED ORDER — POVIDONE-IODINE 10 % EX SWAB: 2.0000 "application " | Freq: Once | CUTANEOUS | Status: DC

## 2018-12-28 MED ORDER — ONDANSETRON HCL 4 MG/2ML IJ SOLN
INTRAMUSCULAR | Status: DC | PRN
  Administered 2018-12-28: 4 mg via INTRAVENOUS

## 2018-12-28 MED ORDER — DOCUSATE SODIUM 100 MG PO CAPS
100.0000 mg | ORAL_CAPSULE | Freq: Two times a day (BID) | ORAL | Status: DC
  Administered 2018-12-28 – 2018-12-31 (×6): 100 mg via ORAL
  Filled 2018-12-28 (×6): qty 1

## 2018-12-28 MED ORDER — OXYCODONE HCL 5 MG/5ML PO SOLN: 5.0000 mg | Freq: Once | ORAL | Status: DC | PRN

## 2018-12-28 SURGICAL SUPPLY — 38 items
BAG ZIPLOCK 12X15 (MISCELLANEOUS) ×2 IMPLANT
BIT DRILL CANN LG 4.3MM (BIT) IMPLANT
CHLORAPREP W/TINT 26 (MISCELLANEOUS) ×2 IMPLANT
COVER PERINEAL POST (MISCELLANEOUS) ×2 IMPLANT
COVER SURGICAL LIGHT HANDLE (MISCELLANEOUS) ×2 IMPLANT
COVER WAND RF STERILE (DRAPES) IMPLANT
DERMABOND ADVANCED (GAUZE/BANDAGES/DRESSINGS) ×1
DERMABOND ADVANCED .7 DNX12 (GAUZE/BANDAGES/DRESSINGS) IMPLANT
DRAPE STERI IOBAN 125X83 (DRAPES) ×2 IMPLANT
DRILL BIT CANN LG 4.3MM (BIT) ×2
DRSG AQUACEL AG ADV 3.5X 4 (GAUZE/BANDAGES/DRESSINGS) ×4 IMPLANT
DRSG AQUACEL AG ADV 3.5X 6 (GAUZE/BANDAGES/DRESSINGS) ×3 IMPLANT
ELECT REM PT RETURN 15FT ADLT (MISCELLANEOUS) ×2 IMPLANT
GLOVE BIOGEL PI IND STRL 7.5 (GLOVE) ×1 IMPLANT
GLOVE BIOGEL PI IND STRL 8.5 (GLOVE) ×1 IMPLANT
GLOVE BIOGEL PI INDICATOR 7.5 (GLOVE) ×1
GLOVE BIOGEL PI INDICATOR 8.5 (GLOVE) ×1
GLOVE ORTHO TXT STRL SZ7.5 (GLOVE) ×2 IMPLANT
GOWN STRL REUS W/TWL LRG LVL3 (GOWN DISPOSABLE) ×2 IMPLANT
GUIDEPIN 3.2X17.5 THRD DISP (PIN) ×1 IMPLANT
HIP FRA NAIL LAG SCREW 10.5X90 (Orthopedic Implant) ×2 IMPLANT
KIT BASIN OR (CUSTOM PROCEDURE TRAY) ×2 IMPLANT
KIT TURNOVER KIT A (KITS) IMPLANT
MANIFOLD NEPTUNE II (INSTRUMENTS) ×2 IMPLANT
NAIL HIP FRACT 130D 9X180 (Orthopedic Implant) ×1 IMPLANT
PACK GENERAL/GYN (CUSTOM PROCEDURE TRAY) ×2 IMPLANT
PADDING CAST COTTON 6X4 STRL (CAST SUPPLIES) ×2 IMPLANT
PROTECTOR NERVE ULNAR (MISCELLANEOUS) ×2 IMPLANT
SCREW BONE CORTICAL 5.0X40 (Screw) ×1 IMPLANT
SCREW LAG HIP FRA NAIL 10.5X90 (Orthopedic Implant) IMPLANT
SUT MNCRL AB 4-0 PS2 18 (SUTURE) ×1 IMPLANT
SUT VIC AB 1 CT1 27 (SUTURE) ×1
SUT VIC AB 1 CT1 27XBRD ANTBC (SUTURE) ×1 IMPLANT
SUT VIC AB 2-0 CT1 27 (SUTURE) ×3
SUT VIC AB 2-0 CT1 27XBRD (SUTURE) ×2 IMPLANT
SUT VIC AB 2-0 CT1 TAPERPNT 27 (SUTURE) IMPLANT
TOWEL OR 17X26 10 PK STRL BLUE (TOWEL DISPOSABLE) ×2 IMPLANT
TOWEL OR NON WOVEN STRL DISP B (DISPOSABLE) ×2 IMPLANT

## 2018-12-28 NOTE — Anesthesia Postprocedure Evaluation (Signed)
Anesthesia Post Note  Patient: Carol Massey  Procedure(s) Performed: INTRAMEDULLARY (IM) NAIL INTERTROCHANTRIC (Right Hip)     Patient location during evaluation: PACU Anesthesia Type: Spinal Level of consciousness: awake and alert Pain management: pain level controlled Vital Signs Assessment: post-procedure vital signs reviewed and stable Respiratory status: spontaneous breathing, nonlabored ventilation, respiratory function stable and patient connected to nasal cannula oxygen Cardiovascular status: blood pressure returned to baseline and stable Postop Assessment: no apparent nausea or vomiting and spinal receding Anesthetic complications: no    Last Vitals:  Vitals:   12/28/18 1530 12/28/18 1545  BP: (!) 103/56 118/61  Pulse: 77 76  Resp: 12 19  Temp: 36.6 C 36.7 C  SpO2: (!) 87% 93%    Last Pain:  Vitals:   12/28/18 1632  TempSrc:   PainSc: 0-No pain                 Brennan Bailey

## 2018-12-28 NOTE — H&P (View-Only) (Signed)
Reason for Consult:   Right intertrochanteric femur fracture Referring Physician:  ED Provider  Carol Massey is an 85 y.o. female.  HPI:    Carol Massey is a 85 y.o. female who presented to the emergency department secondary to right hip pain and inability to bear weight.  Medicine received a history that apparently on April 22 patient experienced a mechanical fall that triggered her to visit primary care doctor and had imaging studies done demonstrated no acute osseous abnormalities.  Patient received pain medication and was discharged home with intention to reinitiate home health physical therapy.  Initially patient was able to bear weight using a walker but on April 29, the pain becomes worse and she was no longer able to bear any weight or tolerate the discomfort.  Patient was brought to the emergency department for further evaluation and management. In the ED, repeat x-rays of her hip demonstrated right hip comminuted fracture with mild displacement and angulation.  ED provider discussed with Dr. Alvan Massey who has recommended patient to be transferred to [facility] long hospital with anticipated surgical intervention on 12/28/2018.  Dr. Alvan Massey will discuss the case with the patient's son.     Past Medical History:  Diagnosis Date  . Anemia   . Anemia, chronic disease 01/21/2018  . COPD (chronic obstructive pulmonary disease) ([place])   . Dieulafoy lesion of colon 2006  . Gastric AVM 2014  . Generalized anxiety disorder 07/24/2015  . Hard of hearing   . Hypertension   . Iron deficiency anemia due to chronic blood loss 05/27/2014  . Major depression in remission ([place]) 07/24/2015  . Osteoporosis   . PUD (peptic ulcer disease) 11/27/2015   Gastric and duodenal ulcer on EGD  . Stroke ([place])   . Unsteady gait   . Upper GI bleed 11/27/2015    Past Surgical History:  Procedure Laterality Date  . ABDOMINAL HYSTERECTOMY    . APPENDECTOMY    . COLONOSCOPY  03/2005   Dr. Gala Massey: rectal and rectosigmoid polyps removed   . COLONOSCOPY WITH ESOPHAGOGASTRODUODENOSCOPY (EGD) N/A 08/12/2013   Dr. Gala Massey: colonoscopy with tubular adenoma, colonic diverticulosis. EGD with non-critical Schatzki's ring, non-manipulated, and gastric AVM with evidence of recent bleeding, s/p thermal sealing and hemostasis clip  . ESOPHAGEAL DILATION  11/27/2015   Procedure: ESOPHAGEAL DILATION;  Surgeon: Carol Dolin, MD;  [facility];  Service: Endoscopy;;  . ESOPHAGOGASTRODUODENOSCOPY  03/2005   Dr. Gala Massey: tiny distal esophageal erosions  . ESOPHAGOGASTRODUODENOSCOPY N/A 11/27/2015   RMR: Cricopharyngeus web, status post dilation. Nonbleeding duodenal and gastric ulcers without stigmata of bleeding. Gastric biopsies benign  . ESOPHAGOGASTRODUODENOSCOPY N/A 02/14/2016   Procedure: ESOPHAGOGASTRODUODENOSCOPY (EGD);  Surgeon: Carol Dolin, MD;  Location[facility];  Service: Endoscopy;  Laterality: N/A;  300  . GIVENS CAPSULE STUDY N/A 11/27/2015   NOT DONE, WAS NOT INDICATED AT THE TIME  . MALONEY DILATION N/A 02/14/2016   Procedure: Venia Minks DILATION;  Surgeon: Carol Dolin, MD;  Location[facility];  Service: Endoscopy;  Laterality: N/A;  . small bowel capsule study  03/2005   Dr. Laural Massey: two small Dieulafoy lesions at ascending colon. tiney specks of blood at duodenal bulb but no identified lesions  . TONSILLECTOMY      Family History  Problem Relation Age of Onset  . Hypertension Mother   . Stroke Mother   . Leukemia Father 103  . Colon cancer Neg Hx     Social History:  reports that she  quit smoking about 4 years ago. Her smoking use included cigarettes. She has a 35.00 pack-year smoking history. She has never used smokeless tobacco. She reports that she does not drink alcohol or use drugs.  Allergies: No Known Allergies   Results for orders placed or performed during the hospital encounter of 12/27/18 (from the past 48 hour(s))  Basic metabolic panel     Status: Abnormal   Collection Time: 12/27/18  1:33  PM  Result Value Ref Range   Sodium 139 135 - 145 mmol/L   Potassium 3.9 3.5 - 5.1 mmol/L   Chloride 103 98 - 111 mmol/L   CO2 21 (L) 22 - 32 mmol/L   Glucose, Bld 150 (H) 70 - 99 mg/dL   BUN 17 8 - 23 mg/dL   Creatinine, Ser 0.69 0.44 - 1.00 mg/dL   Calcium 9.7 8.9 - 10.3 mg/dL   GFR calc non Af Amer >60 >60 mL/min   GFR calc Af Amer >60 >60 mL/min   Anion gap 15 5 - 15    Comment: Performed at [facility], [address], [place], [place] [address]  CBC with Differential     Status: Abnormal   Collection Time: 12/27/18  1:33 PM  Result Value Ref Range   WBC 14.0 (H) 4.0 - 10.5 K/uL   RBC 4.15 3.87 - 5.11 MIL/uL   Hemoglobin 13.2 12.0 - 15.0 g/dL   HCT 39.7 36.0 - 46.0 %   MCV 95.7 80.0 - 100.0 fL   MCH 31.8 26.0 - 34.0 pg   MCHC 33.2 30.0 - 36.0 g/dL   RDW 13.6 11.5 - 15.5 %   Platelets 249 150 - 400 K/uL   nRBC 0.0 0.0 - 0.2 %   Neutrophils Relative % 87 %   Neutro Abs 12.1 (H) 1.7 - 7.7 K/uL   Lymphocytes Relative 4 %   Lymphs Abs 0.6 (L) 0.7 - 4.0 K/uL   Monocytes Relative 8 %   Monocytes Absolute 1.1 (H) 0.1 - 1.0 K/uL   Eosinophils Relative 0 %   Eosinophils Absolute 0.0 0.0 - 0.5 K/uL   Basophils Relative 1 %   Basophils Absolute 0.1 0.0 - 0.1 K/uL   Immature Granulocytes 0 %   Abs Immature Granulocytes 0.05 0.00 - 0.07 K/uL    Comment: Performed at [facility], [address] [place], [place] [address]  Protime-INR     Status: None   Collection Time: 12/27/18  1:33 PM  Result Value Ref Range   Prothrombin Time 13.4 11.4 - 15.2 seconds   INR 1.0 0.8 - 1.2    Comment: (NOTE) INR goal varies based on device and disease states. Performed at [facility], [address] [place], [place] [address]   Type and screen [facility]     Status: None   Collection Time: 12/27/18  1:33 PM  Result Value Ref Range   ABO/RH(D) O POS    Antibody Screen NEG    Sample Expiration      12/30/2018 Performed at [facility], [address], [place], [place] [address]    SARS Coronavirus 2 [facility] order, Performed in [place] hospital lab)     Status: None   Collection Time: 12/27/18  6:41 PM  Result Value Ref Range   SARS Coronavirus 2 NEGATIVE NEGATIVE    Comment: (NOTE) If result is NEGATIVE SARS-CoV-2 target nucleic acids are NOT DETECTED. The SARS-CoV-2 RNA is generally detectable in upper and lower  respiratory specimens during the  acute phase of infection. The lowest  concentration of SARS-CoV-2 viral copies this assay can detect is 250  copies / mL. A negative result does not preclude SARS-CoV-2 infection  and should not be used as the sole basis for treatment or other  patient management decisions.  A negative result may occur with  improper specimen collection / handling, submission of specimen other  than nasopharyngeal swab, presence of viral mutation(s) within the  areas targeted by this assay, and inadequate number of viral copies  (<250 copies / mL). A negative result must be combined with clinical  observations, patient history, and epidemiological information. If result is POSITIVE SARS-CoV-2 target nucleic acids are DETECTED. The SARS-CoV-2 RNA is generally detectable in upper and lower  respiratory specimens dur ing the acute phase of infection.  Positive  results are indicative of active infection with SARS-CoV-2.  Clinical  correlation with patient history and other diagnostic information is  necessary to determine patient infection status.  Positive results do  not rule out bacterial infection or co-infection with other viruses. If result is PRESUMPTIVE POSTIVE SARS-CoV-2 nucleic acids MAY BE PRESENT.   A presumptive positive result was obtained on the submitted specimen  and confirmed on repeat testing.  While 2019 novel coronavirus  (SARS-CoV-2) nucleic acids may be present in the submitted sample  additional confirmatory testing may be necessary for epidemiological  and / or clinical management purposes  to  differentiate between  SARS-CoV-2 and other Sarbecovirus currently known to infect humans.  If clinically indicated additional testing with an alternate test  methodology [phone]) is advised. The SARS-CoV-2 RNA is generally  detectable in upper and lower respiratory sp ecimens during the acute  phase of infection. The expected result is Negative. Fact Sheet for Patients:  StrictlyIdeas.no Fact Sheet for Healthcare Providers: BankingDealers.co.za This test is not yet approved or cleared by the [place] FDA and has been authorized for detection and/or diagnosis of SARS-CoV-2 by FDA under an Emergency Use Authorization (EUA).  This EUA will remain in effect (meaning this test can be used) for the duration of the COVID-19 declaration under Section 564(b)(1) of the Act, 21 U.S.C. section 360bbb-3(b)(1), unless the authorization is terminated or revoked sooner. Performed at [facility], [place] [address], [place], [place] [address]   CBC     Status: Abnormal   Collection Time: 12/28/18  5:34 AM  Result Value Ref Range   WBC 13.1 (H) 4.0 - 10.5 K/uL   RBC 3.80 (L) 3.87 - 5.11 MIL/uL   Hemoglobin 12.1 12.0 - 15.0 g/dL   HCT 37.9 36.0 - 46.0 %   MCV 99.7 80.0 - 100.0 fL   MCH 31.8 26.0 - 34.0 pg   MCHC 31.9 30.0 - 36.0 g/dL   RDW 13.8 11.5 - 15.5 %   Platelets 253 150 - 400 K/uL   nRBC 0.0 0.0 - 0.2 %    Comment: Performed at [facility], [place] [address] [place], [place] [address]  Basic metabolic panel     Status: Abnormal   Collection Time: 12/28/18  5:34 AM  Result Value Ref Range   Sodium 139 135 - 145 mmol/L   Potassium 4.0 3.5 - 5.1 mmol/L   Chloride 106 98 - 111 mmol/L   CO2 23 22 - 32 mmol/L   Glucose, Bld 134 (H) 70 - 99 mg/dL   BUN 27 (H) 8 - 23 mg/dL   Creatinine, Ser 0.83 0.44 - 1.00 mg/dL   Calcium  9.6 8.9 - 10.3 mg/dL   GFR calc non Af Amer >60 >60 mL/min   GFR calc Af Amer >60  >60 mL/min   Anion gap 10 5 - 15    Comment: Performed at [facility], [place] [address], [place], [place] [address]    Dg Chest 1 View  Result Date: 12/27/2018 CLINICAL DATA:  Right hip fracture EXAM: CHEST  1 VIEW COMPARISON:  01/17/2017 FINDINGS: There is no focal parenchymal opacity. There is no pleural effusion or pneumothorax. The heart and mediastinal contours are unremarkable. There is a moderate size hiatal hernia. There is a dextroscoliosis of the thoracic spine. IMPRESSION: No active disease. Electronically Signed   By: Kathreen Devoid   On: 12/27/2018 13:43   Dg Hip Unilat W Or Wo Pelvis 2-3 Views Right  Result Date: 12/27/2018 CLINICAL DATA:  Right hip pain EXAM: DG HIP (WITH OR WITHOUT PELVIS) 2-3V RIGHT COMPARISON:  None. FINDINGS: Severe osteopenia. Comminuted and displaced right intertrochanteric fracture with apex superior angulation. No other fracture or dislocation. No aggressive osseous lesion. IMPRESSION: 1. Comminuted and displaced right intertrochanteric fracture with apex superior angulation. Electronically Signed   By: Kathreen Devoid   On: 12/27/2018 13:41    Review of Systems  Constitutional: Negative.   HENT: Positive for hearing loss.   Eyes: Negative.   Respiratory: Negative.   Cardiovascular: Negative.   Gastrointestinal: Negative.   Genitourinary: Negative.   Musculoskeletal: Positive for joint pain.  Skin: Negative.   Neurological: Negative.   Endo/Heme/Allergies: Negative.   Psychiatric/Behavioral: Positive for depression. The patient is nervous/anxious.    Blood pressure 114/64, pulse (!) 102, temperature 99 F (37.2 C), resp. rate 19, height 4\' 9"  (1.448 m), weight 45.4 kg, SpO2 94 %. Physical Exam  Constitutional: She is oriented to person, place, and time. She appears well-developed.  HENT:  Head: Normocephalic.  Eyes: Pupils are equal, round, and reactive to light.  Neck: Neck supple. No JVD present. No tracheal deviation present. No  thyromegaly present.  Cardiovascular: Normal rate, regular rhythm and intact distal pulses.  Respiratory: Effort normal and breath sounds normal. No respiratory distress. She has no wheezes.  GI: Soft. There is no abdominal tenderness. There is no guarding.  Lymphadenopathy:    She has no cervical adenopathy.  Neurological: She is alert and oriented to person, place, and time.  Skin: Skin is warm and dry.  Psychiatric: She has a normal mood and affect.    Assessment/Plan: Right intertrochanteric femur fracture  Discuss with patient that she needed to have surgery to fix the hip States that she can't really do anything in her current state Also states that her son would make the decision for the surgery or not.  Dr. Alvan Massey discussed the case with the patient's son, and wants to proceed with surgery.  NPO now Plan on ORIF right femur fracture today at about [place] 12/28/2018, 8:43 AM

## 2018-12-28 NOTE — Telephone Encounter (Signed)
Pt's son calling in to make Dr. Nicki Reaper aware that Carol Massey will be having surgery today at [facility] at 1 on her hip

## 2018-12-28 NOTE — Interval H&P Note (Signed)
History and Physical Interval Note:  12/28/2018 12:26 PM  Carol Massey  has presented today for surgery, with the diagnosis of right hip fracture.  The various methods of treatment have been discussed with the patient and family. After consideration of risks, benefits and other options for treatment, the patient has consented to  Procedure(s): INTRAMEDULLARY (IM) NAIL INTERTROCHANTRIC (Right) as a surgical intervention.  The patient's history has been reviewed, patient examined, no change in status, stable for surgery.  I have reviewed the patient's chart and labs.  Questions were answered to the patient's satisfaction.     Mauri Pole

## 2018-12-28 NOTE — Brief Op Note (Signed)
12/27/2018 - 12/28/2018  12:34 PM  PATIENT:  Abran Duke  85 y.o. female  PRE-OPERATIVE DIAGNOSIS:  right hip intertrochanteric femur fracture  POST-OPERATIVE DIAGNOSIS:  right hip intertrochanteric femur fracture  PROCEDURE:  Procedure(s): INTRAMEDULLARY (IM) NAIL INTERTROCHANTRIC (Right)  SURGEON:  Surgeon(s) and Role:    Paralee Cancel, MD - Primary  PHYSICIAN ASSISTANT: Griffith Citron, PA-C  ANESTHESIA:   spinal  EBL:  About 100-150 cc   BLOOD ADMINISTERED:none  DRAINS: none   LOCAL MEDICATIONS USED:  NONE  SPECIMEN:  No Specimen  DISPOSITION OF SPECIMEN:  N/A  COUNTS:  YES  TOURNIQUET:  * No tourniquets in log *  DICTATION: .Other Dictation: Dictation Number [id number]  PLAN OF CARE: Admit to inpatient   PATIENT DISPOSITION:  PACU - hemodynamically stable.   Delay start of Pharmacological VTE agent (>24hrs) due to surgical blood loss or risk of bleeding: no

## 2018-12-28 NOTE — Transfer of Care (Signed)
Immediate Anesthesia Transfer of Care Note  Patient: Carol Massey  Procedure(s) Performed: INTRAMEDULLARY (IM) NAIL INTERTROCHANTRIC (Right Hip)  Patient Location: PACU  Anesthesia Type:Spinal  Level of Consciousness: drowsy  Airway & Oxygen Therapy: Patient Spontanous Breathing and Patient connected to face mask oxygen  Post-op Assessment: Report given to RN and Post -op Vital signs reviewed and stable  Post vital signs: Reviewed and stable  Last Vitals:  Vitals Value Taken Time  BP    Temp    Pulse    Resp    SpO2      Last Pain:  Vitals:   12/28/18 1218  TempSrc:   PainSc: 10-Worst pain ever         Complications: No apparent anesthesia complications

## 2018-12-28 NOTE — Progress Notes (Signed)
Initial Nutrition Assessment  RD working remotely.   DOCUMENTATION CODES:   Not applicable  INTERVENTION:  - diet advancement as medically feasible. - will order Ensure Enlive BID for once diet advanced to at least FLD, each supplement provides 350 kcal and 20 grams of protein. - will order daily multivitamin with minerals. - weigh patient this AM.   NUTRITION DIAGNOSIS:   Inadequate oral intake related to inability to eat as evidenced by NPO status.  GOAL:   Patient will meet greater than or equal to 90% of their needs  MONITOR:   Diet advancement, PO intake, Supplement acceptance, Labs, Weight trends, Skin  REASON FOR ASSESSMENT:   Consult Hip fracture protocol  ASSESSMENT:   85 y.o. female with past medical history significant for iron deficiency anemia, HTN, COPD, GERD, depression, anxiety, and osteoporosis. She presented to the ED 2/2 R hip pain and inability to bear weight. On 4/22 she experienced a mechanical fall that triggered her to visit PCP and had imaging studies done demonstrated no acute osseous abnormalities; patient received pain medication and was discharged home with intention to reinitiate home health PT. Initially patient was able to bear weight using a walker but on 4/29 the pain became worse and she was no longer able to bear any weight. Patient was brought to the ED for further evaluation and management. In the ED, repeat x-rays of R hip demonstrated comminuted fracture with mild displacement and angulation; anticipated surgical intervention on 5/4 evening.    BMI indicates normal weight. Patient was NPO at time of admission and then diet advanced to Heart Healthy 5/3 at 3:37 PM with no documented intakes between then and when she was made NPO again at midnight for anticipated surgery today.   Per chart review, current weight is 100 lb which is exact weight as on 10/20/18. Weight on 07/31/18 was 107 lb. This indicates 7 lb weight loss (6.5% body weight)  from December-February. Will continue to monitor weight trends.    Medications reviewed. Labs reviewed; BUN: 27 mg/dl.      NUTRITION - FOCUSED PHYSICAL EXAM:  unable to complete at this time.   Diet Order:   Diet Order            Diet NPO time specified Except for: Sips with Meds  Diet effective midnight              EDUCATION NEEDS:   No education needs have been identified at this time  Skin:  Skin Assessment: Reviewed RN Assessment  Last BM:  PTA/unknown  Height:   Ht Readings from Last 1 Encounters:  12/27/18 4\' 9"  (1.448 m)    Weight:   Wt Readings from Last 1 Encounters:  12/27/18 45.4 kg    Ideal Body Weight:  41.4 kg  BMI:  Body mass index is 21.64 kg/m.  Estimated Nutritional Needs:   Kcal:  1360-1590 kcal  Protein:  55-70 grams  Fluid:  >/= 1.5 L/day     Jarome Matin, MS, RD, LDN, [facility] Inpatient Clinical Dietitian Pager # [phone] After hours/weekend pager # [phone]

## 2018-12-28 NOTE — Progress Notes (Signed)
PROGRESS NOTE    [name]  Massey:[id number] DOB: [birth year] DOA: 12/27/2018 PCP: [name], MD   Brief Narrative:  HPI per Dr. [name] Carol Massey is a 85 y.o. female with past medical history significant for iron deficiency anemia, hypertension, COPD, gastroesophageal reflux disease, depression, anxiety and osteoporosis; who presented to the emergency department secondary to right hip pain and inability to bear weight.  Apparently on April 22 patient experienced a mechanical fall that triggered her to visit primary care doctor and had imaging studies done demonstrated no acute osseous abnormalities; patient received pain medication and was discharged home with intention to reinitiate home health physical therapy.  Initially patient was able to bear weight using a walker but on April 29, the pain becomes worse and she was no longer able to bear any weight or tolerate the discomfort.  Patient was brought to the emergency department for further evaluation and management. Patient denies any further falls, chest pain, shortness of breath, nausea, vomiting, abdominal pain, dysuria, hematuria, cough, headaches, focal weakness, hematochezia or melena.  There has not been any sick contacts or any recent travels. In the ED and repeat x-rays of her hip demonstrated right hip comminuted fracture with mild displacement and angulation.  Chest x-ray is without acute cardiopulmonary process.  Stable hemoglobin level and a WBCs of 14,000.  Patient blood pressure was elevated and she was mildly tachycardic.  EDP has discussed with orthopedic doctor on call (Dr. Alvan Dame) who has recommended patient to be transferred to [facility] long hospital with anticipated surgical intervention on 12/28/2018 evening.  [facility] has been called to admit patient to the hospital for further evaluation and management.  Assessment & Plan:   Principal Problem:   Closed right hip fracture, initial encounter ([place]) Active Problems:   COPD (chronic  obstructive pulmonary disease) (HCC)   Generalized anxiety disorder   Major depression in remission ([place])   Essential hypertension   Anemia, chronic disease  1 closed right intertrochanteric femur fracture  Secondary to mechanical fall.  Patient seen in consultation by orthopedics, Dr. Alvan Dame and patient for surgical repair this afternoon 12/28/2018.  Per orthopedics.  2.  Hypertension Blood pressure noted to be elevated at time of admission likely secondary to pain.  Patient noted to be on Norvasc 2.5 mg daily.  Systolic blood pressure of 114 this morning.  Will discontinue Norvasc for now.  Follow postop course.  3.  Gastroesophageal reflux disease PPI.  4.  Depression/anxiety Stable.  No suicidal or homicidal ideations.  Continue home regimen of Celexa and alprazolam as needed.  5.  COPD Stable.  Continue Symbicort, Incruse Ellipta.   6.  Leukocytosis Likely reactive leukocytosis secondary to problem #1.  Chest x-ray negative for any acute infection.  Patient with no respiratory symptoms.  Patient denies any dysuria.  Check a UA with cultures and sensitivities.  Follow.    DVT prophylaxis: Per orthopedics. Code Status: DNR Family Communication: Updated patient.  No family at bedside. Disposition Plan: To be determined.   Consultants:   Orthopedics: Dr. Alvan Dame 12/28/2018  Procedures:   Chest x-ray 12/27/2018  Plain films of the right hip and pelvis 12/27/2018  Antimicrobials:   None   Subjective: Patient with complaints of pain in the right hip.  Patient denies any chest pain.  Patient awaiting surgery.  Objective: Vitals:   12/27/18 1927 12/27/18 2031 12/28/18 0539 12/28/18 1050  BP:  132/73 114/64   Pulse:  100 (!) 102   Resp:  Carol Massey)  23 19   Temp:  97.7 F (36.5 C) 99 F (37.2 C)   TempSrc:  Oral    SpO2: 92% 92% 94% 96%  Weight:      Height:       No intake or output data in the 24 hours ending 12/28/18 1142 Filed Weights   12/27/18 1222  Weight: 45.4 kg     Examination:  General exam: Dry mucous membranes. Respiratory system: Clear to auscultation anterior lung fields.  No wheezes, no crackles, no rhonchi.Carol Massey Respiratory effort normal. Cardiovascular system: S1 & S2 heard, RRR. No JVD, murmurs, rubs, gallops or clicks. No pedal edema. Gastrointestinal system: Abdomen is nondistended, soft and nontender. No organomegaly or masses felt. Normal bowel sounds heard. Central nervous system: Alert and oriented. No focal neurological deficits. Extremities: RLE externally rotated and shortened. Skin: No rashes, lesions or ulcers Psychiatry: Judgement and insight appear normal. Mood & affect appropriate.     Data Reviewed: I have personally reviewed following labs and imaging studies  CBC: Recent Labs  Lab 12/27/18 1333 12/28/18 0534  WBC 14.0* 13.1*  NEUTROABS 12.1*  --   HGB 13.2 12.1  HCT 39.7 37.9  MCV 95.7 99.7  PLT 249 403   Basic Metabolic Panel: Recent Labs  Lab 12/27/18 1333 12/28/18 0534  NA 139 139  K 3.9 4.0  CL 103 106  CO2 21* 23  GLUCOSE 150* 134*  BUN 17 27*  CREATININE 0.69 0.83  CALCIUM 9.7 9.6   GFR: Estimated Creatinine Clearance: 27.5 mL/min (by C-G formula based on SCr of 0.83 mg/dL). Liver Function Tests: No results for input(s): AST, ALT, ALKPHOS, BILITOT, PROT, ALBUMIN in the last 168 hours. No results for input(s): LIPASE, AMYLASE in the last 168 hours. No results for input(s): AMMONIA in the last 168 hours. Coagulation Profile: Recent Labs  Lab 12/27/18 1333  INR 1.0   Cardiac Enzymes: No results for input(s): CKTOTAL, CKMB, CKMBINDEX, TROPONINI in the last 168 hours. BNP (last 3 results) No results for input(s): PROBNP in the last 8760 hours. HbA1C: No results for input(s): HGBA1C in the last 72 hours. CBG: No results for input(s): GLUCAP in the last 168 hours. Lipid Profile: No results for input(s): CHOL, HDL, LDLCALC, TRIG, CHOLHDL, LDLDIRECT in the last 72 hours. Thyroid Function  Tests: No results for input(s): TSH, T4TOTAL, FREET4, T3FREE, THYROIDAB in the last 72 hours. Anemia Panel: No results for input(s): VITAMINB12, FOLATE, FERRITIN, TIBC, IRON, RETICCTPCT in the last 72 hours. Sepsis Labs: No results for input(s): PROCALCITON, LATICACIDVEN in the last 168 hours.  Recent Results (from the past 240 hour(s))  SARS Coronavirus 2 [facility] order, Performed in [place] hospital lab)     Status: None   Collection Time: 12/27/18  6:41 PM  Result Value Ref Range Status   SARS Coronavirus 2 NEGATIVE NEGATIVE Final    Comment: (NOTE) If result is NEGATIVE SARS-CoV-2 target nucleic acids are NOT DETECTED. The SARS-CoV-2 RNA is generally detectable in upper and lower  respiratory specimens during the acute phase of infection. The lowest  concentration of SARS-CoV-2 viral copies this assay can detect is 250  copies / mL. A negative result does not preclude SARS-CoV-2 infection  and should not be used as the sole basis for treatment or other  patient management decisions.  A negative result may occur with  improper specimen collection / handling, submission of specimen other  than nasopharyngeal swab, presence of viral mutation(s) within the  areas targeted by this assay, and  inadequate number of viral copies  (<250 copies / mL). A negative result must be combined with clinical  observations, patient history, and epidemiological information. If result is POSITIVE SARS-CoV-2 target nucleic acids are DETECTED. The SARS-CoV-2 RNA is generally detectable in upper and lower  respiratory specimens dur ing the acute phase of infection.  Positive  results are indicative of active infection with SARS-CoV-2.  Clinical  correlation with patient history and other diagnostic information is  necessary to determine patient infection status.  Positive results do  not rule out bacterial infection or co-infection with other viruses. If result is PRESUMPTIVE POSTIVE SARS-CoV-2  nucleic acids MAY BE PRESENT.   A presumptive positive result was obtained on the submitted specimen  and confirmed on repeat testing.  While 2019 novel coronavirus  (SARS-CoV-2) nucleic acids may be present in the submitted sample  additional confirmatory testing may be necessary for epidemiological  and / or clinical management purposes  to differentiate between  SARS-CoV-2 and other Sarbecovirus currently known to infect humans.  If clinically indicated additional testing with an alternate test  methodology [phone]) is advised. The SARS-CoV-2 RNA is generally  detectable in upper and lower respiratory sp ecimens during the acute  phase of infection. The expected result is Negative. Fact Sheet for Patients:  StrictlyIdeas.no Fact Sheet for Healthcare Providers: BankingDealers.co.za This test is not yet approved or cleared by the [place] FDA and has been authorized for detection and/or diagnosis of SARS-CoV-2 by FDA under an Emergency Use Authorization (EUA).  This EUA will remain in effect (meaning this test can be used) for the duration of the COVID-19 declaration under Section 564(b)(1) of the Act, 21 U.S.C. section 360bbb-3(b)(1), unless the authorization is terminated or revoked sooner. Performed at [facility], [place] [address] [place], [place] [address]          Radiology Studies: Dg Chest 1 View  Result Date: 12/27/2018 CLINICAL DATA:  Right hip fracture EXAM: CHEST  1 VIEW COMPARISON:  01/17/2017 FINDINGS: There is no focal parenchymal opacity. There is no pleural effusion or pneumothorax. The heart and mediastinal contours are unremarkable. There is a moderate size hiatal hernia. There is a dextroscoliosis of the thoracic spine. IMPRESSION: No active disease. Electronically Signed   By: Kathreen Devoid   On: 12/27/2018 13:43   Dg Hip Unilat W Or Wo Pelvis 2-3 Views Right  Result Date:  12/27/2018 CLINICAL DATA:  Right hip pain EXAM: DG HIP (WITH OR WITHOUT PELVIS) 2-3V RIGHT COMPARISON:  None. FINDINGS: Severe osteopenia. Comminuted and displaced right intertrochanteric fracture with apex superior angulation. No other fracture or dislocation. No aggressive osseous lesion. IMPRESSION: 1. Comminuted and displaced right intertrochanteric fracture with apex superior angulation. Electronically Signed   By: Kathreen Devoid   On: 12/27/2018 13:41        Scheduled Meds: . acetaminophen  500 mg Oral Daily  . [START ON 12/29/2018] amLODipine  2.5 mg Oral Daily  . budesonide (PULMICORT) nebulizer solution  0.25 mg Nebulization BID  . citalopram  10 mg Oral Daily  . feeding supplement (ENSURE ENLIVE)  237 mL Oral BID BM  . heparin  5,000 Units Subcutaneous Q8H  . multivitamin with minerals  1 tablet Oral Daily  . pantoprazole  40 mg Oral Daily  . povidone-iodine  2 application Topical Once  . umeclidinium bromide  1 puff Inhalation Daily   Continuous Infusions: .  ceFAZolin (ANCEF) IV    . lactated ringers 75 mL/hr at 12/28/18 0956  .  methocarbamol (ROBAXIN) IV       LOS: 1 day    Time spent: 35 minutes    Irine Seal, MD [facility]  If 7PM-7AM, please contact night-coverage [email or web address] 12/28/2018, 11:42 AM

## 2018-12-28 NOTE — Telephone Encounter (Signed)
I did discuss case with the son We are able to do a home visit once patient comes home

## 2018-12-28 NOTE — Anesthesia Procedure Notes (Signed)
Spinal  Patient location during procedure: OR Start time: 12/28/2018 1:08 PM End time: 12/28/2018 1:16 PM Staffing Anesthesiologist: Brennan Bailey, MD Performed: anesthesiologist  Preanesthetic Checklist Completed: patient identified, surgical consent, pre-op evaluation, timeout performed, IV checked, risks and benefits discussed and monitors and equipment checked Spinal Block Patient position: sitting Prep: site prepped and draped and DuraPrep Patient monitoring: cardiac monitor, continuous pulse ox and blood pressure Approach: midline Location: L3-4 Injection technique: single-shot Needle Needle type: [place]  Needle gauge: 22 G Needle length: 9 cm Additional Notes Risks, benefits, and alternative discussed. Patient gave consent to procedure. Prepped and draped in sitting position. First 2 attempts by CRNA unsuccessful. First attempt by myself in left paramedian approach unsuccessful. Clear CSF obtained in midline approach. Poor CSF aspiration despite needle adjustments. Medication injected easily without pain or paraesthesias. Patient tolerated procedure well. Vital signs stable. Spinal setting up prior to my leaving room, patient comfortable. Tawny Asal, MD

## 2018-12-28 NOTE — Op Note (Signed)
NAME: Carol Massey, Carol Massey MEDICAL RECORD [id number] ACCOUNT [id number] DATE OF BIRTH:October 22, 1925 FACILITY: [facility] LOCATION: [facility] PHYSICIAN:Cayle Cordoba Marian Sorrow, MD  OPERATIVE REPORT  DATE OF PROCEDURE:  12/28/2018  PREOPERATIVE DIAGNOSIS:  Comminuted right peritrochanteric femur fracture.  POSTOPERATIVE DIAGNOSIS:  Comminuted right peritrochanteric femur fracture.  PROCEDURE:  Open reduction internal fixation of right peritrochanteric femur fracture utilizing a Biomet Affixus nail size 9 x 180 mm with a 130-degree lag screw and a distal interlock.  SURGEON:  Paralee Cancel, MD  ASSISTANT:  Griffith Citron, PA-C  Note that Ms. Carol Massey was present for the entirety of the case from preoperative positioning, perioperative management of the operative extremity including help with the reduction of the proximal femur fracture, primary wound closure, and general  facilitation of the case.  ANESTHESIA:  Spinal.  SPECIMENS:  None.  COMPLICATIONS:  None apparent.  ESTIMATED BLOOD LOSS:  100 mL about.  INDICATIONS:  The patient is a 85 year old female who unfortunately has had a couple week history of some right hip pain.  She unfortunately developed sudden onset of increasing pain recently with inability to bear weight.  She was initially transferred  to the hospital in [place], [facility], and then transferred to [facility] for definitive management.  I spoke with her son on the phone preoperatively as well as the patient, reviewing the indications and the need for surgery for potential  mobilization.  The risks of infection, nonunion, malunion, need for future surgery were reviewed and discussed.  Consent was obtained for benefit of fracture management.  PROCEDURE IN DETAIL:  The patient was brought to the operative theater.  Once adequate anesthesia, preoperative antibiotics, and Ancef administered, she was positioned supine on the Hana table.  Once bony prominences were adequately  padded and  positioned, her left lower extremity was placed in position on the well leg holder, flexed and abducted out of the way with bony prominences padded, particularly over the peroneal nerve laterally.  Her right foot was placed in a traction boot.   Fluoroscopy was then positioned, then under fluoroscopic imaging to evaluate her fracture pattern.  Initially, her preoperative radiographs indicated a comminuted intertrochanteric femur fracture, but this was really a comminuted  peritrochanteric femur  fracture with anterior displacement of the proximal femur in relationship to the trochanteric segment.  Once I was able to evaluate the need of the reduction based on the identified preoperative film, we began the case.  The right lower extremity was  prepped and draped in sterile fashion using shower-curtain technique.  A timeout was performed identifying the patient, the planned procedure and extremity.  Fluoroscopy was brought back to the field.  A guidewire was utilized to identify the proximal  tip of the trochanter.  A starting incision was then made lateral to the proximal tip of the trochanter.  Soft tissue dissection was carried down to and through the gluteal fascia.  A guidewire was then inserted into the tip of the trochanter.  I  utilized fluoroscopy as well as a Dispensing optician to help with the reduction.  The Cobb elevator was placed over the anterior aspect of the proximal femoral shaft ____ region and applied a downward pressure to help reduce the proximal trochanteric segment  to the shaft.  With this maneuver, I was able to pass the guidewire across the fracture site and confirmed it in AP and lateral planes.  Once I had this guidewire positioned, we reamed the proximal femur.  I then selected a 9 mm nail  based on her short  stature and concern for bow of the anterior femur.  The nail was then passed across the fracture site and confirmed again in AP and lateral planes.  Once this passed  its appropriate depth, I used the insertion guide and passed a guidewire into the center  of the head in AP and lateral planes.  I measured and selected a 90 mm lag screw.  I drilled for this.  The lag screw was positioned.  Based on the significant comminution of the proximal femur, I elected to lock this into place.  I did medialize the  shaft fracture to the intertrochanteric segment with some compression before doing this.  I then placed a distal interlock screw distally.  Final x-rays were obtained in AP and lateral planes.  The wounds were irrigated with normal saline solution.  The  proximal wound was closed in layers with #1 Vicryl in the gluteal fascia, 2-0 Vicryl and a Monocryl stitch.  The distal 2 incisions were closed with 2-0 Vicryl and glue.  Note that a distal interlock screw had been placed through the jig measuring 40 mm.   Following the wound closure, the patient was brought to the recovery room in stable condition, tolerating the procedure well.  We will make her be partial weightbearing to allow for bony healing over the next 6-8 weeks.  Her disposition will be based  on her ability for ambulation and support at home.  She had recently lost her husband in March of this year.  Her son is active in her care.  LN/NUANCE  D:12/28/2018 T:12/28/2018 JOB:[id number]

## 2018-12-28 NOTE — Consult Note (Signed)
Reason for Consult:   Right intertrochanteric femur fracture Referring Physician:  ED Provider  Carol Massey is an 85 y.o. female.  HPI:    Carol Massey is a 85 y.o. female who presented to the emergency department secondary to right hip pain and inability to bear weight.  Medicine received a history that apparently on April 22 patient experienced a mechanical fall that triggered her to visit primary care doctor and had imaging studies done demonstrated no acute osseous abnormalities.  Patient received pain medication and was discharged home with intention to reinitiate home health physical therapy.  Initially patient was able to bear weight using a walker but on April 29, the pain becomes worse and she was no longer able to bear any weight or tolerate the discomfort.  Patient was brought to the emergency department for further evaluation and management. In the ED, repeat x-rays of her hip demonstrated right hip comminuted fracture with mild displacement and angulation.  ED provider discussed with Dr. Alvan Dame who has recommended patient to be transferred to [facility] long hospital with anticipated surgical intervention on 12/28/2018.  Dr. Alvan Dame will discuss the case with the patient's son.     Past Medical History:  Diagnosis Date  . Anemia   . Anemia, chronic disease 01/21/2018  . COPD (chronic obstructive pulmonary disease) ([place])   . Dieulafoy lesion of colon 2006  . Gastric AVM 2014  . Generalized anxiety disorder 07/24/2015  . Hard of hearing   . Hypertension   . Iron deficiency anemia due to chronic blood loss 05/27/2014  . Major depression in remission ([place]) 07/24/2015  . Osteoporosis   . PUD (peptic ulcer disease) 11/27/2015   Gastric and duodenal ulcer on EGD  . Stroke ([place])   . Unsteady gait   . Upper GI bleed 11/27/2015    Past Surgical History:  Procedure Laterality Date  . ABDOMINAL HYSTERECTOMY    . APPENDECTOMY    . COLONOSCOPY  03/2005   Dr. Gala Romney: rectal and rectosigmoid polyps removed   . COLONOSCOPY WITH ESOPHAGOGASTRODUODENOSCOPY (EGD) N/A 08/12/2013   Dr. Gala Romney: colonoscopy with tubular adenoma, colonic diverticulosis. EGD with non-critical Schatzki's ring, non-manipulated, and gastric AVM with evidence of recent bleeding, s/p thermal sealing and hemostasis clip  . ESOPHAGEAL DILATION  11/27/2015   Procedure: ESOPHAGEAL DILATION;  Surgeon: Daneil Dolin, MD;  Location[facility];  Service: Endoscopy;;  . ESOPHAGOGASTRODUODENOSCOPY  03/2005   Dr. Gala Romney: tiny distal esophageal erosions  . ESOPHAGOGASTRODUODENOSCOPY N/A 11/27/2015   RMR: Cricopharyngeus web, status post dilation. Nonbleeding duodenal and gastric ulcers without stigmata of bleeding. Gastric biopsies benign  . ESOPHAGOGASTRODUODENOSCOPY N/A 02/14/2016   Procedure: ESOPHAGOGASTRODUODENOSCOPY (EGD);  Surgeon: Daneil Dolin, MD;  Location: [facility];  Service: Endoscopy;  Laterality: N/A;  300  . GIVENS CAPSULE STUDY N/A 11/27/2015   NOT DONE, WAS NOT INDICATED AT THE TIME  . MALONEY DILATION N/A 02/14/2016   Procedure: Venia Minks DILATION;  Surgeon: Daneil Dolin, MD;  Location: [facility];  Service: Endoscopy;  Laterality: N/A;  . small bowel capsule study  03/2005   Dr. Laural Golden: two small Dieulafoy lesions at ascending colon. tiney specks of blood at duodenal bulb but no identified lesions  . TONSILLECTOMY      Family History  Problem Relation Age of Onset  . Hypertension Mother   . Stroke Mother   . Leukemia Father 74  . Colon cancer Neg Hx     Social History:  reports that she  quit smoking about 4 years ago. Her smoking use included cigarettes. She has a 35.00 pack-year smoking history. She has never used smokeless tobacco. She reports that she does not drink alcohol or use drugs.  Allergies: No Known Allergies   Results for orders placed or performed during the hospital encounter of 12/27/18 (from the past 48 hour(s))  Basic metabolic panel     Status: Abnormal   Collection Time: 12/27/18  1:33  PM  Result Value Ref Range   Sodium 139 135 - 145 mmol/L   Potassium 3.9 3.5 - 5.1 mmol/L   Chloride 103 98 - 111 mmol/L   CO2 21 (L) 22 - 32 mmol/L   Glucose, Bld 150 (H) 70 - 99 mg/dL   BUN 17 8 - 23 mg/dL   Creatinine, Ser 0.69 0.44 - 1.00 mg/dL   Calcium 9.7 8.9 - 10.3 mg/dL   GFR calc non Af Amer >60 >60 mL/min   GFR calc Af Amer >60 >60 mL/min   Anion gap 15 5 - 15    Comment: Performed at [facility], [address], [place], [place] [address]  CBC with Differential     Status: Abnormal   Collection Time: 12/27/18  1:33 PM  Result Value Ref Range   WBC 14.0 (H) 4.0 - 10.5 K/uL   RBC 4.15 3.87 - 5.11 MIL/uL   Hemoglobin 13.2 12.0 - 15.0 g/dL   HCT 39.7 36.0 - 46.0 %   MCV 95.7 80.0 - 100.0 fL   MCH 31.8 26.0 - 34.0 pg   MCHC 33.2 30.0 - 36.0 g/dL   RDW 13.6 11.5 - 15.5 %   Platelets 249 150 - 400 K/uL   nRBC 0.0 0.0 - 0.2 %   Neutrophils Relative % 87 %   Neutro Abs 12.1 (H) 1.7 - 7.7 K/uL   Lymphocytes Relative 4 %   Lymphs Abs 0.6 (L) 0.7 - 4.0 K/uL   Monocytes Relative 8 %   Monocytes Absolute 1.1 (H) 0.1 - 1.0 K/uL   Eosinophils Relative 0 %   Eosinophils Absolute 0.0 0.0 - 0.5 K/uL   Basophils Relative 1 %   Basophils Absolute 0.1 0.0 - 0.1 K/uL   Immature Granulocytes 0 %   Abs Immature Granulocytes 0.05 0.00 - 0.07 K/uL    Comment: Performed at [facility], [address] [place], [place] [address]  Protime-INR     Status: None   Collection Time: 12/27/18  1:33 PM  Result Value Ref Range   Prothrombin Time 13.4 11.4 - 15.2 seconds   INR 1.0 0.8 - 1.2    Comment: (NOTE) INR goal varies based on device and disease states. Performed at [facility], [address] [place], [place] [address]   Type and screen [facility]     Status: None   Collection Time: 12/27/18  1:33 PM  Result Value Ref Range   ABO/RH(D) O POS    Antibody Screen NEG    Sample Expiration      12/30/2018 Performed at [facility], [address] [place], [place] [address]    SARS Coronavirus 2 [facility] order, Performed in [place] hospital lab)     Status: None   Collection Time: 12/27/18  6:41 PM  Result Value Ref Range   SARS Coronavirus 2 NEGATIVE NEGATIVE    Comment: (NOTE) If result is NEGATIVE SARS-CoV-2 target nucleic acids are NOT DETECTED. The SARS-CoV-2 RNA is generally detectable in upper and lower  respiratory specimens during the  acute phase of infection. The lowest  concentration of SARS-CoV-2 viral copies this assay can detect is 250  copies / mL. A negative result does not preclude SARS-CoV-2 infection  and should not be used as the sole basis for treatment or other  patient management decisions.  A negative result may occur with  improper specimen collection / handling, submission of specimen other  than nasopharyngeal swab, presence of viral mutation(s) within the  areas targeted by this assay, and inadequate number of viral copies  (<250 copies / mL). A negative result must be combined with clinical  observations, patient history, and epidemiological information. If result is POSITIVE SARS-CoV-2 target nucleic acids are DETECTED. The SARS-CoV-2 RNA is generally detectable in upper and lower  respiratory specimens dur ing the acute phase of infection.  Positive  results are indicative of active infection with SARS-CoV-2.  Clinical  correlation with patient history and other diagnostic information is  necessary to determine patient infection status.  Positive results do  not rule out bacterial infection or co-infection with other viruses. If result is PRESUMPTIVE POSTIVE SARS-CoV-2 nucleic acids MAY BE PRESENT.   A presumptive positive result was obtained on the submitted specimen  and confirmed on repeat testing.  While 2019 novel coronavirus  (SARS-CoV-2) nucleic acids may be present in the submitted sample  additional confirmatory testing may be necessary for epidemiological  and / or clinical management purposes  to  differentiate between  SARS-CoV-2 and other Sarbecovirus currently known to infect humans.  If clinically indicated additional testing with an alternate test  methodology [id number]) is advised. The SARS-CoV-2 RNA is generally  detectable in upper and lower respiratory sp ecimens during the acute  phase of infection. The expected result is Negative. Fact Sheet for Patients:  StrictlyIdeas.no Fact Sheet for Healthcare Providers: BankingDealers.co.za This test is not yet approved or cleared by the [place] FDA and has been authorized for detection and/or diagnosis of SARS-CoV-2 by FDA under an Emergency Use Authorization (EUA).  This EUA will remain in effect (meaning this test can be used) for the duration of the COVID-19 declaration under Section 564(b)(1) of the Act, 21 U.S.C. section 360bbb-3(b)(1), unless the authorization is terminated or revoked sooner. Performed at [facility], [place] [address], [place], [place] [address]   CBC     Status: Abnormal   Collection Time: 12/28/18  5:34 AM  Result Value Ref Range   WBC 13.1 (H) 4.0 - 10.5 K/uL   RBC 3.80 (L) 3.87 - 5.11 MIL/uL   Hemoglobin 12.1 12.0 - 15.0 g/dL   HCT 37.9 36.0 - 46.0 %   MCV 99.7 80.0 - 100.0 fL   MCH 31.8 26.0 - 34.0 pg   MCHC 31.9 30.0 - 36.0 g/dL   RDW 13.8 11.5 - 15.5 %   Platelets 253 150 - 400 K/uL   nRBC 0.0 0.0 - 0.2 %    Comment: Performed at [facility], [place] [address] [place], [place] [address]  Basic metabolic panel     Status: Abnormal   Collection Time: 12/28/18  5:34 AM  Result Value Ref Range   Sodium 139 135 - 145 mmol/L   Potassium 4.0 3.5 - 5.1 mmol/L   Chloride 106 98 - 111 mmol/L   CO2 23 22 - 32 mmol/L   Glucose, Bld 134 (H) 70 - 99 mg/dL   BUN 27 (H) 8 - 23 mg/dL   Creatinine, Ser 0.83 0.44 - 1.00 mg/dL   Calcium  9.6 8.9 - 10.3 mg/dL   GFR calc non Af Amer >60 >60 mL/min   GFR calc Af Amer >60  >60 mL/min   Anion gap 10 5 - 15    Comment: Performed at [facility], [place] [address] [place], [place] [address]    Dg Chest 1 View  Result Date: 12/27/2018 CLINICAL DATA:  Right hip fracture EXAM: CHEST  1 VIEW COMPARISON:  01/17/2017 FINDINGS: There is no focal parenchymal opacity. There is no pleural effusion or pneumothorax. The heart and mediastinal contours are unremarkable. There is a moderate size hiatal hernia. There is a dextroscoliosis of the thoracic spine. IMPRESSION: No active disease. Electronically Signed   By: Kathreen Devoid   On: 12/27/2018 13:43   Dg Hip Unilat W Or Wo Pelvis 2-3 Views Right  Result Date: 12/27/2018 CLINICAL DATA:  Right hip pain EXAM: DG HIP (WITH OR WITHOUT PELVIS) 2-3V RIGHT COMPARISON:  None. FINDINGS: Severe osteopenia. Comminuted and displaced right intertrochanteric fracture with apex superior angulation. No other fracture or dislocation. No aggressive osseous lesion. IMPRESSION: 1. Comminuted and displaced right intertrochanteric fracture with apex superior angulation. Electronically Signed   By: Kathreen Devoid   On: 12/27/2018 13:41    Review of Systems  Constitutional: Negative.   HENT: Positive for hearing loss.   Eyes: Negative.   Respiratory: Negative.   Cardiovascular: Negative.   Gastrointestinal: Negative.   Genitourinary: Negative.   Musculoskeletal: Positive for joint pain.  Skin: Negative.   Neurological: Negative.   Endo/Heme/Allergies: Negative.   Psychiatric/Behavioral: Positive for depression. The patient is nervous/anxious.    Blood pressure 114/64, pulse (!) 102, temperature 99 F (37.2 C), resp. rate 19, height 4\' 9"  (1.448 m), weight 45.4 kg, SpO2 94 %. Physical Exam  Constitutional: She is oriented to person, place, and time. She appears well-developed.  HENT:  Head: Normocephalic.  Eyes: Pupils are equal, round, and reactive to light.  Neck: Neck supple. No JVD present. No tracheal deviation present. No  thyromegaly present.  Cardiovascular: Normal rate, regular rhythm and intact distal pulses.  Respiratory: Effort normal and breath sounds normal. No respiratory distress. She has no wheezes.  GI: Soft. There is no abdominal tenderness. There is no guarding.  Lymphadenopathy:    She has no cervical adenopathy.  Neurological: She is alert and oriented to person, place, and time.  Skin: Skin is warm and dry.  Psychiatric: She has a normal mood and affect.    Assessment/Plan: Right intertrochanteric femur fracture  Discuss with patient that she needed to have surgery to fix the hip States that she can't really do anything in her current state Also states that her son would make the decision for the surgery or not.  Dr. Alvan Dame discussed the case with the patient's son, and wants to proceed with surgery.  NPO now Plan on ORIF right femur fracture today at about [place] 12/28/2018, 8:43 AM

## 2018-12-28 NOTE — Anesthesia Preprocedure Evaluation (Addendum)
Anesthesia Evaluation  Patient identified by MRN, date of birth, ID band Patient awake    Reviewed: Allergy & Precautions, NPO status , Patient's Chart, lab work & pertinent test results  History of Anesthesia Complications Negative for: history of anesthetic complications  Airway Mallampati: II  TM Distance: >3 FB Neck ROM: Full    Dental  (+) Edentulous Upper, Edentulous Lower   Pulmonary COPD,  COPD inhaler, former smoker,    Pulmonary exam normal breath sounds clear to auscultation       Cardiovascular hypertension, Normal cardiovascular exam Rhythm:Regular Rate:Normal     Neuro/Psych Anxiety Depression CVA, Residual Symptoms    GI/Hepatic Neg liver ROS, PUD,   Endo/Other  negative endocrine ROS  Renal/GU negative Renal ROS     Musculoskeletal Right hip fracture   Abdominal   Peds  Hematology negative hematology ROS (+)   Anesthesia Other Findings Day of surgery medications reviewed with the patient.  Reproductive/Obstetrics                           Anesthesia Physical Anesthesia Plan  ASA: II  Anesthesia Plan: Spinal   Post-op Pain Management:    Induction:   PONV Risk Score and Plan: 2 and Treatment may vary due to age or medical condition, Ondansetron and Propofol infusion  Airway Management Planned: Natural Airway and Simple Face Mask  Additional Equipment: None  Intra-op Plan:   Post-operative Plan:   Informed Consent: I have reviewed the patients History and Physical, chart, labs and discussed the procedure including the risks, benefits and alternatives for the proposed anesthesia with the patient or authorized representative who has indicated his/her understanding and acceptance.   Patient has DNR.   Dental advisory given  Plan Discussed with: CRNA  Anesthesia Plan Comments:        Anesthesia Quick Evaluation

## 2018-12-29 ENCOUNTER — Encounter (HOSPITAL_COMMUNITY): Payer: Self-pay | Admitting: Orthopedic Surgery

## 2018-12-29 LAB — CBC
HCT: 31.3 % — ABNORMAL LOW (ref 36.0–46.0)
Hemoglobin: 9.7 g/dL — ABNORMAL LOW (ref 12.0–15.0)
MCH: 31.2 pg (ref 26.0–34.0)
MCHC: 31 g/dL (ref 30.0–36.0)
MCV: 100.6 fL — ABNORMAL HIGH (ref 80.0–100.0)
Platelets: 213 10*3/uL (ref 150–400)
RBC: 3.11 MIL/uL — ABNORMAL LOW (ref 3.87–5.11)
RDW: 13.6 % (ref 11.5–15.5)
WBC: 10.9 10*3/uL — ABNORMAL HIGH (ref 4.0–10.5)
nRBC: 0 % (ref 0.0–0.2)

## 2018-12-29 LAB — BASIC METABOLIC PANEL
Anion gap: 8 (ref 5–15)
BUN: 33 mg/dL — ABNORMAL HIGH (ref 8–23)
CO2: 25 mmol/L (ref 22–32)
Calcium: 9.3 mg/dL (ref 8.9–10.3)
Chloride: 103 mmol/L (ref 98–111)
Creatinine, Ser: 0.82 mg/dL (ref 0.44–1.00)
GFR calc Af Amer: >60 mL/min (ref >60)
GFR calc non Af Amer: >60 mL/min (ref >60)
Glucose, Bld: 134 mg/dL — ABNORMAL HIGH (ref 70–99)
Potassium: 3.8 mmol/L (ref 3.5–5.1)
Sodium: 136 mmol/L (ref 135–145)

## 2018-12-29 LAB — VITAMIN D 25 HYDROXY (VIT D DEFICIENCY, FRACTURES): Vit D, 25-Hydroxy: 29 ng/mL — ABNORMAL LOW (ref 30.0–100.0)

## 2018-12-29 MED ORDER — SODIUM CHLORIDE 0.9 % IV SOLN
INTRAVENOUS | Status: DC
  Administered 2018-12-29 – 2018-12-30 (×2): via INTRAVENOUS

## 2018-12-29 MED ORDER — ACETAMINOPHEN 500 MG PO TABS
1000.0000 mg | ORAL_TABLET | Freq: Three times a day (TID) | ORAL | Status: DC
  Administered 2018-12-29 – 2018-12-31 (×6): 1000 mg via ORAL
  Filled 2018-12-29 (×6): qty 2

## 2018-12-29 NOTE — Progress Notes (Signed)
Patient had positive sensation and movement to bilateral lower extremities at 2000 5/4, 0001 5/5, and 0400 5/5. Positive pedal pulses. Dressing to R hip clean/dry/intact. Ice therapy applied throughout the shift and documented. SCD's in use. Heel floating to RLE.

## 2018-12-29 NOTE — Progress Notes (Addendum)
Patient ID: Carol Massey, female   DOB: [birth year], 85 y.o.   MRN: [id number] Subjective: 1 Day Post-Op Procedure(s) (LRB): INTRAMEDULLARY (IM) NAIL INTERTROCHANTRIC (Right)    Patient reports pain as moderate.  Having problems with pain in right hip post-op, not unexpected based on intra-operative findings  Objective:   VITALS:   Vitals:   12/29/18 0529 12/29/18 1006  BP: 130/66 132/67  Pulse: 100 96  Resp: 16 (!) 22  Temp: 97.8 F (36.6 C) 97.6 F (36.4 C)  SpO2: 96% 98%    Neurovascular intact Incision: dressing C/D/I - RLE Pain to palpation around right hip  LABS Recent Labs    12/27/18 1333 12/28/18 0534 12/29/18 0656  HGB 13.2 12.1 9.7*  HCT 39.7 37.9 31.3*  WBC 14.0* 13.1* 10.9*  PLT 249 253 213    Recent Labs    12/27/18 1333 12/28/18 0534 12/29/18 0656  NA 139 139 136  K 3.9 4.0 3.8  BUN 17 27* 33*  CREATININE 0.69 0.83 0.82  GLUCOSE 150* 134* 134*    Recent Labs    12/27/18 1333  INR 1.0     Assessment/Plan: 1 Day Post-Op Procedure(s) (LRB): INTRAMEDULLARY (IM) NAIL INTERTROCHANTRIC (Right)   Acute Blood Loss Anemia Follow up labs IRON supplement No transfusion for now   Advance diet Up with therapy  Work on pain control PWB RLE Disposition pending PT and mobility/safety assessment ASA 81 mg of DVT prophylaxis RTC in 2 weeks

## 2018-12-29 NOTE — Progress Notes (Signed)
PT Cancellation Note  Patient Details Name: Carol Massey MRN: [id number] DOB: February 08, 1926   Cancelled Treatment:    Reason Eval/Treat Not Completed: Pain limiting ability to participate Pt reports increased pain.  RN gave IV pain meds and pt continues to report pain.  Pt requested ginger ale and provided.  PT to check back.   Loa Idler,KATHrine E 12/29/2018, 10:22 AM Carmelia Bake, PT, DPT Acute Rehabilitation Services Office: [phone] Pager: [phone]

## 2018-12-29 NOTE — Evaluation (Signed)
Physical Therapy Evaluation Patient Details Name: Carol Massey MRN: [id number] DOB: 1926-08-06 Today's Date: 12/29/2018   History of Present Illness  85 y.o. female with past medical history significant for iron deficiency anemia, hypertension, COPD, gastroesophageal reflux disease, depression, anxiety and osteoporosis and admitted with femur fracture s/p Right IM nail 12/28/18  Clinical Impression  Patient is s/p above surgery resulting in functional limitations due to the deficits listed below (see PT Problem List).  Patient will benefit from skilled PT to increase their independence and safety with mobility to allow discharge to the venue listed below.  Pt premedicated and assisted to sitting EOB.  Pt reports R hip pain however appeared happy to sit EOB for a few minutes.  Pt encouraged to drink her ensure and take a few bites of lunch while sitting.  Pt then assisted back to bed.  Pt will likely require more assist for mobility then baseline and lives with son.  If son unable to assist then would recommend SNF upon d/c.  Pt reports she was ambulatory with rollator prior to admission.     Follow Up Recommendations SNF    Equipment Recommendations  Wheelchair (measurements PT);Wheelchair cushion (measurements PT);Hospital bed(if home)    Recommendations for Other Services       Precautions / Restrictions Precautions Precautions: Fall Restrictions Weight Bearing Restrictions: Yes RLE Weight Bearing: Partial weight bearing      Mobility  Bed Mobility Overal bed mobility: Needs Assistance Bed Mobility: Supine to Sit;Sit to Supine     Supine to sit: Max assist;HOB elevated Sit to supine: Max assist   General bed mobility comments: assist for upper and lower body, utilized bed pads for positioning  Transfers                 General transfer comment: pt declined to attempt due to pain  Ambulation/Gait                Stairs            Wheelchair Mobility     Modified Rankin (Stroke Patients Only)       Balance Overall balance assessment: Needs assistance;History of Falls Sitting-balance support: Bilateral upper extremity supported Sitting balance-Leahy Scale: Poor Sitting balance - Comments: requires UE support, able to sit EOB holding left rail                                     Pertinent Vitals/Pain Pain Assessment: Faces Faces Pain Scale: Hurts whole lot Pain Location: R hip Pain Descriptors / Indicators: Sore;Discomfort;Moaning;Grimacing;Guarding;Aching Pain Intervention(s): Monitored during session;Repositioned;Premedicated before session;Limited activity within patient's tolerance    Home Living Family/patient expects to be discharged to:: Private residence Living Arrangements: Spouse/significant other;Children(son) Available Help at Discharge: Family Type of Home: House         Home Equipment: Environmental consultant - 4 wheels      Prior Function Level of Independence: Independent with assistive device(s)         Comments: lives with her son, uses rollator     Journalist, newspaper        Extremity/Trunk Assessment   Upper Extremity Assessment Upper Extremity Assessment: Generalized weakness    Lower Extremity Assessment Lower Extremity Assessment: Generalized weakness;RLE deficits/detail RLE Deficits / Details: requiring assist RLE: Unable to fully assess due to pain       Communication   Communication: HOH(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)  Cognition Arousal/Alertness: Awake/alert Behavior During Therapy: WFL for tasks assessed/performed Overall Cognitive Status: Difficult to assess                                 General Comments: pt very HOH, appears appropriate for what she does hear, daughter on phone during session and reports pt lives with her son      General Comments      Exercises     Assessment/Plan    PT Assessment Patient needs continued PT  services  PT Problem List Decreased strength;Decreased mobility;Decreased activity tolerance;Decreased range of motion;Decreased balance;Decreased knowledge of use of DME;Decreased knowledge of precautions;Pain       PT Treatment Interventions DME instruction;Functional mobility training;Balance training;Therapeutic activities;Therapeutic exercise;Gait training;Patient/family education    PT Goals (Current goals can be found in the Care Plan section)  Acute Rehab PT Goals PT Goal Formulation: With patient Time For Goal Achievement: 01/12/19 Potential to Achieve Goals: Fair    Frequency Min 3X/week   Barriers to discharge        Co-evaluation               AM-PAC PT "6 Clicks" Mobility  Outcome Measure Help needed turning from your back to your side while in a flat bed without using bedrails?: A Lot Help needed moving from lying on your back to sitting on the side of a flat bed without using bedrails?: A Lot Help needed moving to and from a bed to a chair (including a wheelchair)?: Total Help needed standing up from a chair using your arms (e.g., wheelchair or bedside chair)?: Total Help needed to walk in hospital room?: Total Help needed climbing 3-5 steps with a railing? : Total 6 Click Score: 8    End of Session   Activity Tolerance: Patient limited by pain Patient left: in bed;with call bell/phone within reach;with bed alarm set Nurse Communication: Mobility status PT Visit Diagnosis: Pain;Other abnormalities of gait and mobility (R26.89) Pain - Right/Left: Right Pain - part of body: Hip    Time: 4742-5956 PT Time Calculation (min) (ACUTE ONLY): 31 min   Charges:   PT Evaluation $PT Eval Low Complexity: 1 Low PT Treatments $Therapeutic Activity: 8-22 mins       Carmelia Bake, PT, DPT Acute Rehabilitation Services Office: [phone] Pager: [phone]  Trena Platt 12/29/2018, 3:23 PM

## 2018-12-29 NOTE — Progress Notes (Addendum)
PROGRESS NOTE    Carol Massey  TGG:[id number] DOB: 07/15/1926 DOA: 12/27/2018 PCP: [name], MD   Brief Narrative:  HPI per Dr. [name] Carol Massey is a 85 y.o. female with past medical history significant for iron deficiency anemia, hypertension, COPD, gastroesophageal reflux disease, depression, anxiety and osteoporosis; who presented to the emergency department secondary to right hip pain and inability to bear weight.  Apparently on April 22 patient experienced a mechanical fall that triggered her to visit primary care doctor and had imaging studies done demonstrated no acute osseous abnormalities; patient received pain medication and was discharged home with intention to reinitiate home health physical therapy.  Initially patient was able to bear weight using a walker but on April 29, the pain becomes worse and she was no longer able to bear any weight or tolerate the discomfort.  Patient was brought to the emergency department for further evaluation and management. Patient denies any further falls, chest pain, shortness of breath, nausea, vomiting, abdominal pain, dysuria, hematuria, cough, headaches, focal weakness, hematochezia or melena.  There has not been any sick contacts or any recent travels. In the ED and repeat x-rays of her hip demonstrated right hip comminuted fracture with mild displacement and angulation.  Chest x-ray is without acute cardiopulmonary process.  Stable hemoglobin level and a WBCs of 14,000.  Patient blood pressure was elevated and she was mildly tachycardic.  EDP has discussed with orthopedic doctor on call (Dr. Alvan Dame) who has recommended patient to be transferred to [facility] with anticipated surgical intervention on 12/28/2018 evening.  [facility] has been called to admit patient to the hospital for further evaluation and management.  Assessment & Plan:   Principal Problem:   Closed right hip fracture, initial encounter ([place]) Active Problems:   COPD (chronic  obstructive pulmonary disease) (HCC)   Generalized anxiety disorder   Major depression in remission ([place])   Essential hypertension   Anemia, chronic disease   Intertrochanteric fracture of right hip ([place])  1 closed right intertrochanteric femur fracture  Secondary to mechanical fall.  Patient seen in consultation by orthopedics, Dr. Alvan Dame and patient status post ORIF of right peritrochanteric femur fracture per Dr. Alvan Dame 12/28/2018.  PT/OT.  Pain management per orthopedics. Per orthopedics.  2.  Hypertension Blood pressure noted to be elevated at time of admission likely secondary to pain.  Antihypertensive medications held preoperatively.  Norvasc resumed.  Follow.   3.  Gastroesophageal reflux disease Continue PPI.  4.  Depression/anxiety Stable.  No suicidal or homicidal ideations.  Continue home regimen of Celexa and alprazolam as needed.  5.  COPD Stable.  Continue current regimen of Symbicort, Incruse Ellipta.   6.  Leukocytosis Likely reactive leukocytosis secondary to problem #1.  Chest x-ray negative for any acute infection.  Patient with no respiratory symptoms.  Patient denies any dysuria.  Urinalysis ordered and pending.  Leukocytosis trending down.  Follow.  7.  Postoperative acute blood loss anemia Patient with no overt bleeding.  Hemoglobin postop at 9.7 from 12.2.  Patient denies any overt bleeding.  Follow H&H.  Transfusion threshold hemoglobin less than 7.    DVT prophylaxis: Aspirin Per orthopedics. Code Status: DNR Family Communication: Updated patient.  No family at bedside. Disposition Plan: Skilled nursing facility when okay with orthopedics.    Consultants:   Orthopedics: Dr. Alvan Dame 12/28/2018  Procedures:   Chest x-ray 12/27/2018  Plain films of the right hip and pelvis 12/27/2018  ORIF of right peritrochanteric femur fracture  per Dr. Alvan Dame 12/28/2018  Antimicrobials:   None   Subjective: Patient complaining of some right hip pain.  Stated just received  some pain medication which is helping a little bit.  Patient denies any chest pain.  Patient with chronic shortness of breath which she states is unchanged. No overt bleeding.    Objective: Vitals:   12/28/18 2035 12/29/18 0108 12/29/18 0529 12/29/18 1006  BP: 117/62 (!) 130/59 130/66 132/67  Pulse: (!) 106 (!) 108 100 96  Resp: 16 20 16  (!) 22  Temp: 98.9 F (37.2 C) 98.3 F (36.8 C) 97.8 F (36.6 C) 97.6 F (36.4 C)  TempSrc: Oral Oral Oral Oral  SpO2: 94% 94% 96% 98%  Weight:      Height:        Intake/Output Summary (Last 24 hours) at 12/29/2018 1238 Last data filed at 12/29/2018 0500 Gross per 24 hour  Intake 2097.91 ml  Output 650 ml  Net 1447.91 ml   Filed Weights   12/27/18 1222 12/28/18 0923  Weight: 45.4 kg 49.3 kg    Examination:  General exam: Dry mucous membranes.   Respiratory system: CTA B anterior lung fields.  No wheezes, no crackles, no rhonchi.  Normal respiratory effort.  Cardiovascular system: Regular rate rhythm no murmurs rubs or gallops.  No JVD.  No lower extremity edema.  Gastrointestinal system: Abdomen is soft, nontender, nondistended, positive bowel sounds.  No rebound.  No guarding.  Central nervous system: Alert and oriented. No focal neurological deficits. Extremities: No clubbing cyanosis or edema.  Dressing noted on right hip.  Skin: No rashes, lesions or ulcers Psychiatry: Judgement and insight appear normal. Mood & affect appropriate.     Data Reviewed: I have personally reviewed following labs and imaging studies  CBC: Recent Labs  Lab 12/27/18 1333 12/28/18 0534 12/29/18 0656  WBC 14.0* 13.1* 10.9*  NEUTROABS 12.1*  --   --   HGB 13.2 12.1 9.7*  HCT 39.7 37.9 31.3*  MCV 95.7 99.7 100.6*  PLT 249 253 102   Basic Metabolic Panel: Recent Labs  Lab 12/27/18 1333 12/28/18 0534 12/29/18 0656  NA 139 139 136  K 3.9 4.0 3.8  CL 103 106 103  CO2 21* 23 25  GLUCOSE 150* 134* 134*  BUN 17 27* 33*  CREATININE 0.69 0.83 0.82   CALCIUM 9.7 9.6 9.3   GFR: Estimated Creatinine Clearance: 30.9 mL/min (by C-G formula based on SCr of 0.82 mg/dL). Liver Function Tests: No results for input(s): AST, ALT, ALKPHOS, BILITOT, PROT, ALBUMIN in the last 168 hours. No results for input(s): LIPASE, AMYLASE in the last 168 hours. No results for input(s): AMMONIA in the last 168 hours. Coagulation Profile: Recent Labs  Lab 12/27/18 1333  INR 1.0   Cardiac Enzymes: No results for input(s): CKTOTAL, CKMB, CKMBINDEX, TROPONINI in the last 168 hours. BNP (last 3 results) No results for input(s): PROBNP in the last 8760 hours. HbA1C: No results for input(s): HGBA1C in the last 72 hours. CBG: No results for input(s): GLUCAP in the last 168 hours. Lipid Profile: No results for input(s): CHOL, HDL, LDLCALC, TRIG, CHOLHDL, LDLDIRECT in the last 72 hours. Thyroid Function Tests: No results for input(s): TSH, T4TOTAL, FREET4, T3FREE, THYROIDAB in the last 72 hours. Anemia Panel: No results for input(s): VITAMINB12, FOLATE, FERRITIN, TIBC, IRON, RETICCTPCT in the last 72 hours. Sepsis Labs: No results for input(s): PROCALCITON, LATICACIDVEN in the last 168 hours.  Recent Results (from the past 240 hour(s))  SARS Coronavirus  2 [facility] order, Performed in [facility] hospital lab)     Status: None   Collection Time: 12/27/18  6:41 PM  Result Value Ref Range Status   SARS Coronavirus 2 NEGATIVE NEGATIVE Final    Comment: (NOTE) If result is NEGATIVE SARS-CoV-2 target nucleic acids are NOT DETECTED. The SARS-CoV-2 RNA is generally detectable in upper and lower  respiratory specimens during the acute phase of infection. The lowest  concentration of SARS-CoV-2 viral copies this assay can detect is 250  copies / mL. A negative result does not preclude SARS-CoV-2 infection  and should not be used as the sole basis for treatment or other  patient management decisions.  A negative result may occur with  improper specimen  collection / handling, submission of specimen other  than nasopharyngeal swab, presence of viral mutation(s) within the  areas targeted by this assay, and inadequate number of viral copies  (<250 copies / mL). A negative result must be combined with clinical  observations, patient history, and epidemiological information. If result is POSITIVE SARS-CoV-2 target nucleic acids are DETECTED. The SARS-CoV-2 RNA is generally detectable in upper and lower  respiratory specimens dur ing the acute phase of infection.  Positive  results are indicative of active infection with SARS-CoV-2.  Clinical  correlation with patient history and other diagnostic information is  necessary to determine patient infection status.  Positive results do  not rule out bacterial infection or co-infection with other viruses. If result is PRESUMPTIVE POSTIVE SARS-CoV-2 nucleic acids MAY BE PRESENT.   A presumptive positive result was obtained on the submitted specimen  and confirmed on repeat testing.  While 2019 novel coronavirus  (SARS-CoV-2) nucleic acids may be present in the submitted sample  additional confirmatory testing may be necessary for epidemiological  and / or clinical management purposes  to differentiate between  SARS-CoV-2 and other Sarbecovirus currently known to infect humans.  If clinically indicated additional testing with an alternate test  methodology [phone]) is advised. The SARS-CoV-2 RNA is generally  detectable in upper and lower respiratory sp ecimens during the acute  phase of infection. The expected result is Negative. Fact Sheet for Patients:  StrictlyIdeas.no Fact Sheet for Healthcare Providers: BankingDealers.co.za This test is not yet approved or cleared by the [place] FDA and has been authorized for detection and/or diagnosis of SARS-CoV-2 by FDA under an Emergency Use Authorization (EUA).  This EUA will remain in effect  (meaning this test can be used) for the duration of the COVID-19 declaration under Section 564(b)(1) of the Act, 21 U.S.C. section 360bbb-3(b)(1), unless the authorization is terminated or revoked sooner. Performed at [facility], [place] [address], [place], [place] [address]          Radiology Studies: Dg Chest 1 View  Result Date: 12/27/2018 CLINICAL DATA:  Right hip fracture EXAM: CHEST  1 VIEW COMPARISON:  01/17/2017 FINDINGS: There is no focal parenchymal opacity. There is no pleural effusion or pneumothorax. The heart and mediastinal contours are unremarkable. There is a moderate size hiatal hernia. There is a dextroscoliosis of the thoracic spine. IMPRESSION: No active disease. Electronically Signed   By: Kathreen Devoid   On: 12/27/2018 13:43   Dg C-arm 1-60 Min-no Report  Result Date: 12/28/2018 Fluoroscopy was utilized by the requesting physician.  No radiographic interpretation.   Dg Hip Operative Unilat W Or W/o Pelvis Right  Result Date: 12/28/2018 CLINICAL DATA:  Intraoperative films right hip replacement. EXAM: OPERATIVE RIGHT HIP (WITH PELVIS IF  PERFORMED) 3 VIEWS TECHNIQUE: Fluoroscopic spot image(s) were submitted for interpretation post-operatively. COMPARISON:  12/27/2018 FINDINGS: Exam demonstrates placement of an intramedullary nail with associated compression screw within the left femur is the screw bridges the femoral neck into the femoral head bridging patient's trochanteric fracture site with hardware intact and near anatomic alignment over the fracture site. There is slight posterior angulation where the compression screw bridges the intramedullary nail. Degenerative change of the right hip. IMPRESSION: Fixation of right intertrochanteric fracture with hardware intact and near anatomic alignment over the fracture site. Note slight posterior angulation where the compression screw bridges the intramedullary nail. Electronically Signed   By: Marin Olp  M.D.   On: 12/28/2018 14:24   Dg Hip Unilat W Or Wo Pelvis 2-3 Views Right  Result Date: 12/27/2018 CLINICAL DATA:  Right hip pain EXAM: DG HIP (WITH OR WITHOUT PELVIS) 2-3V RIGHT COMPARISON:  None. FINDINGS: Severe osteopenia. Comminuted and displaced right intertrochanteric fracture with apex superior angulation. No other fracture or dislocation. No aggressive osseous lesion. IMPRESSION: 1. Comminuted and displaced right intertrochanteric fracture with apex superior angulation. Electronically Signed   By: Kathreen Devoid   On: 12/27/2018 13:41        Scheduled Meds: . acetaminophen  1,000 mg Oral Q8H  . amLODipine  2.5 mg Oral Daily  . aspirin EC  81 mg Oral BID  . budesonide-formoterol  2 puff Inhalation BID  . citalopram  10 mg Oral Daily  . docusate sodium  100 mg Oral BID  . feeding supplement (ENSURE ENLIVE)  237 mL Oral BID BM  . ferrous sulfate  325 mg Oral TID PC  . heparin  5,000 Units Subcutaneous Q8H  . multivitamin with minerals  1 tablet Oral Daily  . pantoprazole  40 mg Oral Daily  . umeclidinium bromide  1 puff Inhalation Daily   Continuous Infusions: . methocarbamol (ROBAXIN) IV Stopped (12/28/18 1822)     LOS: 2 days    Time spent: 35 minutes    Irine Seal, MD [facility]  If 7PM-7AM, please contact night-coverage [email or web address] 12/29/2018, 12:38 PM

## 2018-12-30 LAB — BASIC METABOLIC PANEL
Anion gap: 8 (ref 5–15)
BUN: 31 mg/dL — ABNORMAL HIGH (ref 8–23)
CO2: 25 mmol/L (ref 22–32)
Calcium: 8.7 mg/dL — ABNORMAL LOW (ref 8.9–10.3)
Chloride: 105 mmol/L (ref 98–111)
Creatinine, Ser: 0.78 mg/dL (ref 0.44–1.00)
GFR calc Af Amer: >60 mL/min (ref >60)
GFR calc non Af Amer: >60 mL/min (ref >60)
Glucose, Bld: 120 mg/dL — ABNORMAL HIGH (ref 70–99)
Potassium: 3.9 mmol/L (ref 3.5–5.1)
Sodium: 138 mmol/L (ref 135–145)

## 2018-12-30 LAB — CBC
HCT: 28.3 % — ABNORMAL LOW (ref 36.0–46.0)
Hemoglobin: 8.9 g/dL — ABNORMAL LOW (ref 12.0–15.0)
MCH: 32.2 pg (ref 26.0–34.0)
MCHC: 31.4 g/dL (ref 30.0–36.0)
MCV: 102.5 fL — ABNORMAL HIGH (ref 80.0–100.0)
Platelets: 210 10*3/uL (ref 150–400)
RBC: 2.76 MIL/uL — ABNORMAL LOW (ref 3.87–5.11)
RDW: 13.6 % (ref 11.5–15.5)
WBC: 9.9 10*3/uL (ref 4.0–10.5)
nRBC: 0 % (ref 0.0–0.2)

## 2018-12-30 MED ORDER — METHOCARBAMOL 500 MG PO TABS: 500.0000 mg | ORAL_TABLET | Freq: Four times a day (QID) | ORAL | 0 refills | Status: DC | PRN

## 2018-12-30 MED ORDER — FERROUS SULFATE 325 (65 FE) MG PO TABS: 325.0000 mg | ORAL_TABLET | Freq: Three times a day (TID) | ORAL | 0 refills | Status: DC

## 2018-12-30 MED ORDER — ASPIRIN 81 MG PO TBEC: 81.0000 mg | DELAYED_RELEASE_TABLET | Freq: Two times a day (BID) | ORAL | 0 refills | Status: DC

## 2018-12-30 MED ORDER — OXYCODONE HCL 5 MG PO TABS: 5.0000 mg | ORAL_TABLET | Freq: Four times a day (QID) | ORAL | 0 refills | Status: DC | PRN

## 2018-12-30 NOTE — Progress Notes (Signed)
12/30/2018 2 Days Post-Op Procedure(s) (LRB): INTRAMEDULLARY (IM) NAIL INTERTROCHANTRIC (Right) Patient reports pain as moderate.   Patient seen in rounds for Dr. Alvan Dame. Patient is well, and has had no acute complaints or problems other than pain in the right hip. No acute events overnight. Voiding without difficulty, and positive flatus.  They will be PWB to the right. Plan is to go Home after hospital stay.  Vital signs in last 24 hours: Temp:  [97.6 F (36.4 C)-98.5 F (36.9 C)] 97.6 F (36.4 C) (05/06 0549) Pulse Rate:  [84-105] 84 (05/06 0549) Resp:  [16-22] 16 (05/06 0549) BP: (118-149)/(56-77) 136/61 (05/06 0549) SpO2:  [93 %-99 %] 99 % (05/06 0549)  I&O's: I/O last 3 completed shifts: In: 1624.8 [P.O.:840; I.V.:746.9; IV Piggyback:37.9] Out: 230 [Urine:230] No intake/output data recorded.  Labs: Recent Labs    12/27/18 1333 12/28/18 0534 12/29/18 0656 12/30/18 0621  HGB 13.2 12.1 9.7* 8.9*   Recent Labs    12/29/18 0656 12/30/18 0621  WBC 10.9* 9.9  RBC 3.11* 2.76*  HCT 31.3* 28.3*  PLT 213 210   Recent Labs    12/29/18 0656 12/30/18 0621  NA 136 138  K 3.8 3.9  CL 103 105  CO2 25 25  BUN 33* 31*  CREATININE 0.82 0.78  GLUCOSE 134* 120*  CALCIUM 9.3 8.7*   Recent Labs    12/27/18 1333  INR 1.0    Exam: General - Patient is Alert and Oriented Extremity - Neurologically intact Sensation intact distally Intact pulses distally Dorsiflexion/Plantar flexion intact Dressing - dressing C/D/I Motor Function - intact, moving foot and toes well on exam.   Past Medical History:  Diagnosis Date  . Anemia   . Anemia, chronic disease 01/21/2018  . COPD (chronic obstructive pulmonary disease) ([place])   . Dieulafoy lesion of colon 2006  . Gastric AVM 2014  . Generalized anxiety disorder 07/24/2015  . Hard of hearing   . Hypertension   . Iron deficiency anemia due to chronic blood loss 05/27/2014  . Major depression in remission ([place]) 07/24/2015  .  Osteoporosis   . PUD (peptic ulcer disease) 11/27/2015   Gastric and duodenal ulcer on EGD  . Stroke ([place])   . Unsteady gait   . Upper GI bleed 11/27/2015    Assessment/Plan: 2 Days Post-Op Procedure(s) (LRB): INTRAMEDULLARY (IM) NAIL INTERTROCHANTRIC (Right) Principal Problem:   Closed right hip fracture, initial encounter ([place]) Active Problems:   COPD (chronic obstructive pulmonary disease) (HCC)   Generalized anxiety disorder   Major depression in remission ([place])   Essential hypertension   Anemia, chronic disease   Intertrochanteric fracture of right hip (HCC)  Estimated body mass index is 23.52 kg/m as calculated from the following:   Height as of this encounter: 4\' 9"  (1.448 m).   Weight as of this encounter: 49.3 kg. Advance diet Up with therapy  DVT Prophylaxis - Aspirin PWB to the RLE.  Hemoglobin at 8.9, down from 9.7 yesterday. At this time, this does not require transfusion. Possible discharge home today if she continues progressing with physical therapy and is safe to discharge. She will be PWB to the RLE until follow up. She was able to sit at the end of her bed yesterday which was an improvement. Per nursing, she had a better night last night with pain control, and was able to sleep through the night. Her son would like her to discharge home with him, and she agrees. She had home health care set up prior  to this hospital stay. She will follow up in the office in 2 weeks.   Griffith Citron, PA-C Orthopedic Surgery [facility]

## 2018-12-30 NOTE — Progress Notes (Addendum)
PROGRESS NOTE    Carol Massey  QIH:[id number] DOB: 03-21-1926 DOA: 12/27/2018 PCP: [name], MD   Brief Narrative: Patient is a 85 year old female with history of iron deficiency anemia, hypertension, COPD, GERD, depression, anxiety, osteoporosis who presented to the emergency department with complaint of right hip pain and inability to bear weight.  X-rays of the hip showed right hip comminuted fracture with mild displacement and angulation.  Orthopedics consulted and she underwent ORIF.  Assessment & Plan:   Principal Problem:   Closed right hip fracture, initial encounter ([place]) Active Problems:   COPD (chronic obstructive pulmonary disease) (HCC)   Generalized anxiety disorder   Major depression in remission ([place])   Essential hypertension   Anemia, chronic disease   Intertrochanteric fracture of right hip (HCC)  Close right intertrochanteric femur fracture: Orthopedics was following.  Status post ORIF on 12/28/2018.  PT/OT following.  Pain management.  Aspirin for DVT prophylaxis. She will follow-up with orthopedics as an outpatient in 2 weeks.  Recommended partial weightbearing.  Waiting for physical therapy reevaluation today.  Recommended skilled nursing facility but patient and family willing to go home.  Anticipate discharge to home with home health tomorrow morning.  Hypertension: Currently blood pressure stable.  Continue current medications.  GERD: Continue PPI  Depression/anxiety: Continue home regimen.  COPD: Currently stable.  Continue current bronchodilators  Leukocytosis: Most likely reactive.  No indication for antibiotic therapy.  Postoperative acute blood loss anemia: Hemoglobin of 8.9 today.  No need of transfusion at this point.  Continue to monitor CBC.  Addendum: Patient was seen by PT and recommended SNF. Family wants to take her home. Patient needs a Hospital bed due her ambulation problem secondary to Hip Fracture.She will need hospital bed for at  least 3 months.  Nutrition Problem: Inadequate oral intake Etiology: inability to eat      DVT prophylaxis:Aspirin Code Status: Full Family Communication: None present at the bed side Disposition Plan: Home tomorrow   Consultants: Orthopedics  Procedures: ORIF Antimicrobials:  Anti-infectives (From admission, onward)   Start     Dose/Rate Route Frequency Ordered Stop   12/28/18 1800  ceFAZolin (ANCEF) IVPB 2g/100 mL premix     2 g 200 mL/hr over 30 Minutes Intravenous Every 6 hours 12/28/18 1605 12/29/18 0055   12/28/18 1000  ceFAZolin (ANCEF) IVPB 2g/100 mL premix     2 g 200 mL/hr over 30 Minutes Intravenous On call to O.R. 12/28/18 [id number] 12/28/18 1319      Subjective:  Patient seen and examined the bedside this morning.  Hemodynamically stable.  Was eating her lunch.  She had a brief coughing spell after eating her food.  Denies any shortness of breath or chest pain.  Pain is well controlled on the surgical site on the right hip.   Objective: Vitals:   12/29/18 1947 12/29/18 2050 12/30/18 0549 12/30/18 0740  BP:  138/63 136/61   Pulse:  100 84   Resp:  17 16   Temp:  97.9 F (36.6 C) 97.6 F (36.4 C)   TempSrc:  Oral Oral   SpO2: 95% 94% 99% 94%  Weight:      Height:        Intake/Output Summary (Last 24 hours) at 12/30/2018 1141 Last data filed at 12/30/2018 1100 Gross per 24 hour  Intake 1466.86 ml  Output 280 ml  Net 1186.86 ml   Filed Weights   12/27/18 1222 12/28/18 0923  Weight: 45.4 kg 49.3 kg  Examination:  General exam: Elderly debilitated female  HEENT:PERRL,Oral mucosa moist, Ear/Nose normal on gross exam Respiratory system: Bilateral equal air entry, normal vesicular breath sounds, no wheezes or crackles  Cardiovascular system: S1 & S2 heard, RRR. No JVD, murmurs, rubs, gallops or clicks. No pedal edema. Gastrointestinal system: Abdomen is nondistended, soft and nontender. No organomegaly or masses felt. Normal bowel sounds  heard. Central nervous system: Alert and oriented. No focal neurological deficits. Extremities: No edema, no clubbing ,no cyanosis, distal peripheral pulses palpable. Clean surgical wound on the right hip covered by dressing, appropriate tenderness Skin: No ulcers,no icterus ,no pallor, senile purpura  Data Reviewed: I have personally reviewed following labs and imaging studies  CBC: Recent Labs  Lab 12/27/18 1333 12/28/18 0534 12/29/18 0656 12/30/18 0621  WBC 14.0* 13.1* 10.9* 9.9  NEUTROABS 12.1*  --   --   --   HGB 13.2 12.1 9.7* 8.9*  HCT 39.7 37.9 31.3* 28.3*  MCV 95.7 99.7 100.6* 102.5*  PLT 249 253 213 536   Basic Metabolic Panel: Recent Labs  Lab 12/27/18 1333 12/28/18 0534 12/29/18 0656 12/30/18 0621  NA 139 139 136 138  K 3.9 4.0 3.8 3.9  CL 103 106 103 105  CO2 21* 23 25 25   GLUCOSE 150* 134* 134* 120*  BUN 17 27* 33* 31*  CREATININE 0.69 0.83 0.82 0.78  CALCIUM 9.7 9.6 9.3 8.7*   GFR: Estimated Creatinine Clearance: 31.7 mL/min (by C-G formula based on SCr of 0.78 mg/dL). Liver Function Tests: No results for input(s): AST, ALT, ALKPHOS, BILITOT, PROT, ALBUMIN in the last 168 hours. No results for input(s): LIPASE, AMYLASE in the last 168 hours. No results for input(s): AMMONIA in the last 168 hours. Coagulation Profile: Recent Labs  Lab 12/27/18 1333  INR 1.0   Cardiac Enzymes: No results for input(s): CKTOTAL, CKMB, CKMBINDEX, TROPONINI in the last 168 hours. BNP (last 3 results) No results for input(s): PROBNP in the last 8760 hours. HbA1C: No results for input(s): HGBA1C in the last 72 hours. CBG: No results for input(s): GLUCAP in the last 168 hours. Lipid Profile: No results for input(s): CHOL, HDL, LDLCALC, TRIG, CHOLHDL, LDLDIRECT in the last 72 hours. Thyroid Function Tests: No results for input(s): TSH, T4TOTAL, FREET4, T3FREE, THYROIDAB in the last 72 hours. Anemia Panel: No results for input(s): VITAMINB12, FOLATE, FERRITIN, TIBC,  IRON, RETICCTPCT in the last 72 hours. Sepsis Labs: No results for input(s): PROCALCITON, LATICACIDVEN in the last 168 hours.  Recent Results (from the past 240 hour(s))  SARS Coronavirus 2 [facility] order, Performed in [place] hospital lab)     Status: None   Collection Time: 12/27/18  6:41 PM  Result Value Ref Range Status   SARS Coronavirus 2 NEGATIVE NEGATIVE Final    Comment: (NOTE) If result is NEGATIVE SARS-CoV-2 target nucleic acids are NOT DETECTED. The SARS-CoV-2 RNA is generally detectable in upper and lower  respiratory specimens during the acute phase of infection. The lowest  concentration of SARS-CoV-2 viral copies this assay can detect is 250  copies / mL. A negative result does not preclude SARS-CoV-2 infection  and should not be used as the sole basis for treatment or other  patient management decisions.  A negative result may occur with  improper specimen collection / handling, submission of specimen other  than nasopharyngeal swab, presence of viral mutation(s) within the  areas targeted by this assay, and inadequate number of viral copies  (<250 copies / mL). A negative result must  be combined with clinical  observations, patient history, and epidemiological information. If result is POSITIVE SARS-CoV-2 target nucleic acids are DETECTED. The SARS-CoV-2 RNA is generally detectable in upper and lower  respiratory specimens dur ing the acute phase of infection.  Positive  results are indicative of active infection with SARS-CoV-2.  Clinical  correlation with patient history and other diagnostic information is  necessary to determine patient infection status.  Positive results do  not rule out bacterial infection or co-infection with other viruses. If result is PRESUMPTIVE POSTIVE SARS-CoV-2 nucleic acids MAY BE PRESENT.   A presumptive positive result was obtained on the submitted specimen  and confirmed on repeat testing.  While 2019 novel coronavirus   (SARS-CoV-2) nucleic acids may be present in the submitted sample  additional confirmatory testing may be necessary for epidemiological  and / or clinical management purposes  to differentiate between  SARS-CoV-2 and other Sarbecovirus currently known to infect humans.  If clinically indicated additional testing with an alternate test  methodology [phone]) is advised. The SARS-CoV-2 RNA is generally  detectable in upper and lower respiratory sp ecimens during the acute  phase of infection. The expected result is Negative. Fact Sheet for Patients:  StrictlyIdeas.no Fact Sheet for Healthcare Providers: BankingDealers.co.za This test is not yet approved or cleared by the [place] FDA and has been authorized for detection and/or diagnosis of SARS-CoV-2 by FDA under an Emergency Use Authorization (EUA).  This EUA will remain in effect (meaning this test can be used) for the duration of the COVID-19 declaration under Section 564(b)(1) of the Act, 21 U.S.C. section 360bbb-3(b)(1), unless the authorization is terminated or revoked sooner. Performed at [facility], [place] [address], [place], [place] [address]          Radiology Studies: Dg C-arm 1-60 Min-no Report  Result Date: 12/28/2018 Fluoroscopy was utilized by the requesting physician.  No radiographic interpretation.   Dg Hip Operative Unilat W Or W/o Pelvis Right  Result Date: 12/28/2018 CLINICAL DATA:  Intraoperative films right hip replacement. EXAM: OPERATIVE RIGHT HIP (WITH PELVIS IF PERFORMED) 3 VIEWS TECHNIQUE: Fluoroscopic spot image(s) were submitted for interpretation post-operatively. COMPARISON:  12/27/2018 FINDINGS: Exam demonstrates placement of an intramedullary nail with associated compression screw within the left femur is the screw bridges the femoral neck into the femoral head bridging patient's trochanteric fracture site with hardware intact  and near anatomic alignment over the fracture site. There is slight posterior angulation where the compression screw bridges the intramedullary nail. Degenerative change of the right hip. IMPRESSION: Fixation of right intertrochanteric fracture with hardware intact and near anatomic alignment over the fracture site. Note slight posterior angulation where the compression screw bridges the intramedullary nail. Electronically Signed   By: Marin Olp M.D.   On: 12/28/2018 14:24        Scheduled Meds: . acetaminophen  1,000 mg Oral Q8H  . amLODipine  2.5 mg Oral Daily  . aspirin EC  81 mg Oral BID  . budesonide-formoterol  2 puff Inhalation BID  . citalopram  10 mg Oral Daily  . docusate sodium  100 mg Oral BID  . feeding supplement (ENSURE ENLIVE)  237 mL Oral BID BM  . ferrous sulfate  325 mg Oral TID PC  . multivitamin with minerals  1 tablet Oral Daily  . pantoprazole  40 mg Oral Daily  . umeclidinium bromide  1 puff Inhalation Daily   Continuous Infusions: . methocarbamol (ROBAXIN) IV Stopped (12/28/18 1822)  LOS: 3 days    Time spent: 35 mins.More than 50% of that time was spent in counseling and/or coordination of care.      Shelly Coss, MD [facility] Pager [phone]  If 7PM-7AM, please contact night-coverage [email or web address] Password [email or web address] 12/30/2018, 11:41 AM

## 2018-12-30 NOTE — Care Management Important Message (Signed)
Important Message  Patient Details IM Letter given to Servando Snare SW to present to the Patient Name: Carol Massey MRN: [id number] Date of Birth: 1926/06/18   Medicare Important Message Given:  Yes    Kerin Salen 12/30/2018, 10:20 AMImportant Message  Patient Details  Name: Carol Massey MRN: [id number] Date of Birth: Apr 28, 1926   Medicare Important Message Given:  Yes    Kerin Salen 12/30/2018, 10:20 AM

## 2018-12-30 NOTE — Progress Notes (Signed)
Attempted to get urine sample. Patient's purewick leaked onto bed, unable to collect. Patient has not urinated again. Will pass on to day RN.

## 2018-12-30 NOTE — Progress Notes (Signed)
Patient has a tub and will need assistance learning with how to get into and out of tub.

## 2018-12-30 NOTE — Progress Notes (Signed)
Called PA Caryl Pina for Ortho to clarify dressing changes. The dressing that is on the patient is to not be changed and will be changed at the follow up with the orthopedic doctor in 2 weeks. The dressing is water proof so the patient can bathe.

## 2018-12-30 NOTE — TOC Initial Note (Signed)
Transition of Care [facility]) - Initial/Assessment Note    Patient Details  Name: Carol Massey MRN: [id number] Date of Birth: 1926/02/18  [facility]) CM/SW Contact:    Servando Snare, LCSW Phone Number: 12/30/2018, 12:57 PM  Clinical Narrative:   Patient and family prefer to go home with home health. Patient has had home health in the past. Son, Carol Massey states that patient has walker, Wheelchair, and shower chair at home. Carol Massey feels that a hospital bed would be beneficial to patient.   LCSW will arrange home health once orders are available.                 Expected Discharge Plan: [place] Barriers to Discharge: Continued Medical Work up   Patient Goals and CMS Choice Patient states their goals for this hospitalization and ongoing recovery are:: return home CMS Medicare.gov Compare Post Acute Care list provided to:: Patient Represenative (must comment)(Son, Carol Massey) Choice offered to / list presented to : NA  Expected Discharge Plan and Services Expected Discharge Plan: [place]   Discharge Planning Services: NA Post Acute Care Choice: Durable Medical Equipment, Home Health Living arrangements for the past 2 months: Single Family Home Expected Discharge Date: 12/30/18                                    Prior Living Arrangements/Services Living arrangements for the past 2 months: Single Family Home Lives with:: Adult Children Patient language and need for interpreter reviewed:: No Do you feel safe going back to the place where you live?: Yes      Need for Family Participation in Patient Care: Yes (Comment) Care giver support system in place?: Yes (comment) Current home services: DME Criminal Activity/Legal Involvement Pertinent to Current Situation/Hospitalization: No - Comment as needed  Activities of Daily Living Home Assistive Devices/Equipment: Dentures (specify type), Eyeglasses, Walker (specify type)(upper/lower  dentures, front wheeled walker) ADL Screening (condition at time of admission) Patient's cognitive ability adequate to safely complete daily activities?: Yes Is the patient deaf or have difficulty hearing?: No Does the patient have difficulty seeing, even when wearing glasses/contacts?: No Does the patient have difficulty concentrating, remembering, or making decisions?: No Patient able to express need for assistance with ADLs?: Yes Does the patient have difficulty dressing or bathing?: Yes Independently performs ADLs?: No Communication: Independent Dressing (OT): Needs assistance Is this a change from baseline?: Change from baseline, expected to last >3 days Grooming: Needs assistance Is this a change from baseline?: Change from baseline, expected to last >3 days Feeding: Needs assistance Is this a change from baseline?: Change from baseline, expected to last >3 days Bathing: Needs assistance Is this a change from baseline?: Change from baseline, expected to last >3 days Toileting: Dependent Is this a change from baseline?: Change from baseline, expected to last >3days In/Out Bed: Dependent Is this a change from baseline?: Change from baseline, expected to last >3 days Walks in Home: Dependent Is this a change from baseline?: Change from baseline, expected to last >3 days Does the patient have difficulty walking or climbing stairs?: Yes Weakness of Legs: Right Weakness of Arms/Hands: None  Permission Sought/Granted Permission sought to share information with : Family Supports Permission granted to share information with : Yes, Verbal Permission Granted  Share Information with NAME: Carol Massey     Permission granted to share info w Relationship: Son  Emotional Assessment Appearance:: Appears stated age Attitude/Demeanor/Rapport: Engaged Affect (typically observed): Calm Orientation: : Oriented to Self, Oriented to Place, Oriented to  Time, Oriented to Situation Alcohol /  Substance Use: Not Applicable Psych Involvement: No (comment)  Admission diagnosis:  Closed fracture of right hip, initial encounter [facility]) [S72.001A] Patient Active Problem List   Diagnosis Date Noted  . Intertrochanteric fracture of right hip ([place]) 12/28/2018  . Closed right hip fracture, initial encounter ([place]) 12/27/2018  . Anemia, chronic disease 01/21/2018  . 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  . Essential hypertension 12/12/2016  . History of peptic ulcer disease   . Ataxia 12/01/2015  . PUD (peptic ulcer disease)   . Melena 11/27/2015  . Upper GI bleed 11/27/2015  . Tobacco abuse 11/27/2015  . Azotemia 11/27/2015  . Right-sided chest pain 11/27/2015  . Dysphagia   . Generalized anxiety disorder 07/24/2015  . Major depression in remission ([place]) 07/24/2015  . Iron deficiency anemia due to chronic blood loss 05/27/2014  . Hyperlipemia 03/18/2013  . COPD (chronic obstructive pulmonary disease) ([place]) 01/12/2013  . Osteoporosis 01/12/2013   PCP:  [name], MD Pharmacy:   [facility], [place] [address] Phone: [phone] Fax: [phone]     Social Determinants of Health (SDOH) Interventions    Readmission Risk Interventions Readmission Risk Prevention Plan 12/30/2018  Transportation Screening Complete  PCP or Specialist Appt within 5-7 Days Not Complete  Home Care Screening Complete  Medication Review (RN CM) Not Complete  Some recent data might be hidden

## 2018-12-31 ENCOUNTER — Telehealth: Payer: Self-pay | Admitting: Family Medicine

## 2018-12-31 LAB — URINALYSIS, ROUTINE W REFLEX MICROSCOPIC
Bilirubin Urine: NEGATIVE
Glucose, UA: NEGATIVE mg/dL
Hgb urine dipstick: NEGATIVE
Ketones, ur: 5 mg/dL — AB
Nitrite: NEGATIVE
Protein, ur: NEGATIVE mg/dL
Specific Gravity, Urine: 1.024 (ref 1.005–1.030)
pH: 5 (ref 5.0–8.0)

## 2018-12-31 LAB — CBC WITH DIFFERENTIAL/PLATELET
Abs Immature Granulocytes: 0.07 10*3/uL (ref 0.00–0.07)
Basophils Absolute: 0.1 10*3/uL (ref 0.0–0.1)
Basophils Relative: 1 %
Eosinophils Absolute: 0 10*3/uL (ref 0.0–0.5)
Eosinophils Relative: 0 %
HCT: 26.7 % — ABNORMAL LOW (ref 36.0–46.0)
Hemoglobin: 8.5 g/dL — ABNORMAL LOW (ref 12.0–15.0)
Immature Granulocytes: 1 %
Lymphocytes Relative: 8 %
Lymphs Abs: 0.7 10*3/uL (ref 0.7–4.0)
MCH: 32.3 pg (ref 26.0–34.0)
MCHC: 31.8 g/dL (ref 30.0–36.0)
MCV: 101.5 fL — ABNORMAL HIGH (ref 80.0–100.0)
Monocytes Absolute: 0.8 10*3/uL (ref 0.1–1.0)
Monocytes Relative: 9 %
Neutro Abs: 7.6 10*3/uL (ref 1.7–7.7)
Neutrophils Relative %: 81 %
Platelets: 252 10*3/uL (ref 150–400)
RBC: 2.63 MIL/uL — ABNORMAL LOW (ref 3.87–5.11)
RDW: 13.6 % (ref 11.5–15.5)
WBC: 9.2 10*3/uL (ref 4.0–10.5)
nRBC: 0 % (ref 0.0–0.2)

## 2018-12-31 MED ORDER — POLYETHYLENE GLYCOL 3350 17 G PO PACK: 17.0000 g | PACK | Freq: Every day | ORAL | 0 refills | Status: DC | PRN

## 2018-12-31 NOTE — Telephone Encounter (Signed)
Carol Massey wanted to let Dr. Nicki Massey know Carol Massey is coming home today. And they will be having home health come in. He said she is doing much better!

## 2018-12-31 NOTE — Discharge Summary (Signed)
Physician Discharge Summary  LAKEETA DOBOSZ NOB:[id number] DOB: [birth year] DOA: 12/27/2018  PCP: [name], MD  Admit date: 12/27/2018 Discharge date: 12/31/2018  Admitted From: Home Disposition:  Home  Discharge Condition:Stable CODE STATUS:DNR Diet recommendation: Heart Healthy   Brief/Interim Summary:  Patient is a 85 year old female with history of iron deficiency anemia, hypertension, COPD, GERD, depression, anxiety, osteoporosis who presented to the emergency department with complaint of right hip pain and inability to bear weight.  X-rays of the hip showed right hip comminuted fracture with mild displacement and angulation.  Orthopedics consulted and she underwent ORIF. Patient was seen by physical therapist and recommended skilled nursing facility on discharge but the patient and family want to go home.  Home health arranged.  She will follow-up with orthopedics in 2 weeks.  Following problems were addressed during her hospitalization:  Close right intertrochanteric femur fracture: Orthopedics was following.  Status post ORIF on 12/28/2018.  PT/OT following.  Pain management.  Aspirin for DVT prophylaxis. She will follow-up with orthopedics as an outpatient in 2 weeks.  Recommended partial weightbearing.  Recommended skilled nursing facility but patient and family willing to go home. Discharge to home with home health today.  Hypertension: Currently blood pressure stable.  Continue current medications.  GERD: Continue PPI  Depression/anxiety: Continue home regimen.  COPD: Currently stable.  Continue current bronchodilators  Leukocytosis: Most likely reactive.  No indication for antibiotic therapy.  Postoperative acute blood loss anemia: Hemoglobin of 8.5 today.  No need of transfusion at this point.  Continue to monitor CBC an outpatient.  Continue iron supplementation.   Patient needs a Hospital bed due her ambulation problem secondary to Hip  Fracture.     Discharge Diagnoses:  Principal Problem:   Closed right hip fracture, initial encounter ([place]) Active Problems:   COPD (chronic obstructive pulmonary disease) (HCC)   Generalized anxiety disorder   Major depression in remission ([place])   Essential hypertension   Anemia, chronic disease   Intertrochanteric fracture of right hip [facility])    Discharge Instructions  Discharge Instructions    Call MD / Call 911   Complete by:  As directed    If you experience chest pain or shortness of breath, CALL 911 and be transported to the hospital emergency room.  If you develope a fever above 101 F, pus (white drainage) or increased drainage or redness at the wound, or calf pain, call your surgeon's office.   Change dressing   Complete by:  As directed    Maintain surgical dressing until follow up in the clinic. If the edges start to pull up, may reinforce with tape. If the dressing is no longer working, may remove and cover with gauze and tape, but must keep the area dry and clean.  Call with any questions or concerns.   Change dressing   Complete by:  As directed    Maintain surgical dressing until follow up in the clinic. If the edges start to pull up, may reinforce with tape. If the dressing is no longer working, may remove and cover with gauze and tape, but must keep the area dry and clean.  Call with any questions or concerns.   Constipation Prevention   Complete by:  As directed    Drink plenty of fluids.  Prune juice may be helpful.  You may use a stool softener, such as Colace (over the counter) 100 mg twice a day.  Use MiraLax (over the counter) for constipation as needed.  Diet - low sodium heart healthy   Complete by:  As directed    Discharge instructions   Complete by:  As directed    Maintain surgical dressing until follow up in the clinic. If the edges start to pull up, may reinforce with tape. If the dressing is no longer working, may remove and cover with gauze and  tape, but must keep the area dry and clean.  Follow up in 2 weeks at [facility]. Call with any questions or concerns.   Increase activity slowly as tolerated   Complete by:  As directed    Partial weight bearing with assist device as directed.   TED hose   Complete by:  As directed    Use stockings (TED hose) for 2 weeks on both leg(s).  You may remove them at night for sleeping.   TED hose   Complete by:  As directed    Use stockings (TED hose) for 2 weeks on both leg(s).  You may remove them at night for sleeping.     Allergies as of 12/31/2018   No Known Allergies     Medication List    STOP taking these medications   HYDROcodone-acetaminophen 5-325 MG tablet Commonly known as:  NORCO/VICODIN     TAKE these medications   acetaminophen 500 MG tablet Commonly known as:  TYLENOL Take 500 mg by mouth daily.   albuterol 108 (90 Base) MCG/ACT inhaler Commonly known as:  VENTOLIN HFA INHALE 2 PUFFS INTO THE LUNGS EVERY 4 HOURS AS NEEDED FOR WHEEZING OR SHORTNESS OF BREATH.   ALPRAZolam 0.25 MG tablet Commonly known as:  XANAX 1/2 qam and 1 to 2 qhs prn sleep What changed:    how much to take  how to take this  when to take this   amLODipine 2.5 MG tablet Commonly known as:  NORVASC TAKE 1 TABLET BY MOUTH ONCE A DAY.   aspirin 81 MG EC tablet Take 1 tablet (81 mg total) by mouth 2 (two) times daily for 28 days.   CALCIUM + D3 PO Take 1 tablet by mouth daily.   citalopram 10 MG tablet Commonly known as:  CELEXA Take 10 mg by mouth every day   ferrous sulfate 325 (65 FE) MG tablet Take 1 tablet (325 mg total) by mouth 3 (three) times daily after meals for 14 days.   Klor-Con M20 20 MEQ tablet Generic drug:  potassium chloride SA TAKE 1 TABLET BY MOUTH TWICE A DAY What changed:    how much to take  when to take this   methocarbamol 500 MG tablet Commonly known as:  ROBAXIN Take 1 tablet (500 mg total) by mouth every 6 (six) hours as needed for  muscle spasms.   mupirocin ointment 2 % Commonly known as:  BACTROBAN Apply to affected area 1 times daily What changed:    how much to take  how to take this  when to take this  reasons to take this  additional instructions   OVER THE COUNTER MEDICATION Take 1 tablet by mouth daily. probiotic   oxyCODONE 5 MG immediate release tablet Commonly known as:  Oxy IR/ROXICODONE Take 1-2 tablets (5-10 mg total) by mouth every 6 (six) hours as needed for severe pain (for severe pain).   pantoprazole 40 MG tablet Commonly known as:  PROTONIX TAKE ONE TABLET BY MOUTH DAILY FOR TREATMENT FOR THE ULCER.   polyethylene glycol 17 g packet Commonly known as:  MiraLax Take 17 g by  mouth daily as needed.   PROBIOTIC PO Take 1 capsule by mouth daily. "10 Strands"   Symbicort 80-4.5 MCG/ACT inhaler Generic drug:  budesonide-formoterol INHALE 2 PUFFS INTO THE LUNGS TWICE DAILY.   tiotropium 18 MCG inhalation capsule Commonly known as:  Spiriva HandiHaler INHALE 1 PUFF INTO THE LUNGS DAILY What changed:    how much to take  how to take this  when to take this  additional instructions   VITAMIN B 12 PO Take 1,000 mcg by mouth daily.   VITAMIN C PLUS WILD ROSE HIPS PO Take 1 tablet by mouth daily.            Durable Medical Equipment  (From admission, onward)         Start     Ordered   12/31/18 0904  For home use only DME Hospital bed  Once    Question Answer Comment  Patient has (list medical condition): hip fracture   Bed type Semi-electric      12/31/18 0904   12/31/18 0904  For home use only DME Bedside commode  Once    Question:  Patient needs a bedside commode to treat with the following condition  Answer:  Hip fracture ([place])   12/31/18 0904           Discharge Care Instructions  (From admission, onward)         Start     Ordered   12/30/18 0000  Change dressing    Comments:  Maintain surgical dressing until follow up in the clinic. If the  edges start to pull up, may reinforce with tape. If the dressing is no longer working, may remove and cover with gauze and tape, but must keep the area dry and clean.  Call with any questions or concerns.   12/30/18 0830   12/30/18 0000  Change dressing    Comments:  Maintain surgical dressing until follow up in the clinic. If the edges start to pull up, may reinforce with tape. If the dressing is no longer working, may remove and cover with gauze and tape, but must keep the area dry and clean.  Call with any questions or concerns.   12/30/18 0830         Follow-up Information    Paralee Cancel, MD Follow up in 2 week(s).   Specialty:  Orthopedic Surgery Why:  wound check and X-rays Contact information: [address] [place] [address] [phone]        [name], MD. Schedule an appointment as soon as possible for a visit in 1 week(s).   Specialty:  Family Medicine Contact information: [address] [place] [address] [phone]          No Known Allergies  Consultations:  Orthopedics   Procedures/Studies: Dg Chest 1 View  Result Date: 12/27/2018 CLINICAL DATA:  Right hip fracture EXAM: CHEST  1 VIEW COMPARISON:  01/17/2017 FINDINGS: There is no focal parenchymal opacity. There is no pleural effusion or pneumothorax. The heart and mediastinal contours are unremarkable. There is a moderate size hiatal hernia. There is a dextroscoliosis of the thoracic spine. IMPRESSION: No active disease. Electronically Signed   By: Kathreen Devoid   On: 12/27/2018 13:43   Dg Tibia/fibula Right  Result Date: 12/16/2018 CLINICAL DATA:  Right leg pain after fall several weeks ago. EXAM: RIGHT FEMUR 2 VIEWS; RIGHT KNEE - COMPLETE 4+ VIEW; RIGHT TIBIA AND FIBULA - 2 VIEW COMPARISON:  Right tibia  and fibula x-rays dated November 15, 2016. FINDINGS: Right femur: The proximal femur is not included in the field of view. There is no evidence of fracture or other  focal bone lesions. Osteopenia. Soft tissues are unremarkable. Vascular calcifications. Right knee: No acute fracture or dislocation. No joint effusion. Minimal tricompartmental joint space narrowing. Chondrocalcinosis of the menisci. Osteopenia. Soft tissues are unremarkable. Right tibia and fibula: There is no evidence of fracture or other focal bone lesions. The ankle is grossly unremarkable. Osteopenia. Soft tissues are unremarkable. Vascular calcifications. IMPRESSION: 1. No acute osseous abnormality. Electronically Signed   By: Titus Dubin M.D.   On: 12/16/2018 16:07   Dg Knee Complete 4 Views Right  Result Date: 12/16/2018 CLINICAL DATA:  Right leg pain after fall several weeks ago. EXAM: RIGHT FEMUR 2 VIEWS; RIGHT KNEE - COMPLETE 4+ VIEW; RIGHT TIBIA AND FIBULA - 2 VIEW COMPARISON:  Right tibia and fibula x-rays dated November 15, 2016. FINDINGS: Right femur: The proximal femur is not included in the field of view. There is no evidence of fracture or other focal bone lesions. Osteopenia. Soft tissues are unremarkable. Vascular calcifications. Right knee: No acute fracture or dislocation. No joint effusion. Minimal tricompartmental joint space narrowing. Chondrocalcinosis of the menisci. Osteopenia. Soft tissues are unremarkable. Right tibia and fibula: There is no evidence of fracture or other focal bone lesions. The ankle is grossly unremarkable. Osteopenia. Soft tissues are unremarkable. Vascular calcifications. IMPRESSION: 1. No acute osseous abnormality. Electronically Signed   By: Titus Dubin M.D.   On: 12/16/2018 16:07   Dg C-arm 1-60 Min-no Report  Result Date: 12/28/2018 Fluoroscopy was utilized by the requesting physician.  No radiographic interpretation.   Dg Hip Operative Unilat W Or W/o Pelvis Right  Result Date: 12/28/2018 CLINICAL DATA:  Intraoperative films right hip replacement. EXAM: OPERATIVE RIGHT HIP (WITH PELVIS IF PERFORMED) 3 VIEWS TECHNIQUE: Fluoroscopic spot image(s)  were submitted for interpretation post-operatively. COMPARISON:  12/27/2018 FINDINGS: Exam demonstrates placement of an intramedullary nail with associated compression screw within the left femur is the screw bridges the femoral neck into the femoral head bridging patient's trochanteric fracture site with hardware intact and near anatomic alignment over the fracture site. There is slight posterior angulation where the compression screw bridges the intramedullary nail. Degenerative change of the right hip. IMPRESSION: Fixation of right intertrochanteric fracture with hardware intact and near anatomic alignment over the fracture site. Note slight posterior angulation where the compression screw bridges the intramedullary nail. Electronically Signed   By: Marin Olp M.D.   On: 12/28/2018 14:24   Dg Hip Unilat W Or Wo Pelvis 2-3 Views Right  Result Date: 12/27/2018 CLINICAL DATA:  Right hip pain EXAM: DG HIP (WITH OR WITHOUT PELVIS) 2-3V RIGHT COMPARISON:  None. FINDINGS: Severe osteopenia. Comminuted and displaced right intertrochanteric fracture with apex superior angulation. No other fracture or dislocation. No aggressive osseous lesion. IMPRESSION: 1. Comminuted and displaced right intertrochanteric fracture with apex superior angulation. Electronically Signed   By: Kathreen Devoid   On: 12/27/2018 13:41   Dg Hip Unilat With Pelvis Min 4 Views Right  Result Date: 12/16/2018 CLINICAL DATA:  Right hip pain. EXAM: DG HIP (WITH OR WITHOUT PELVIS) 4+V RIGHT COMPARISON:  Right femur 12/16/2018 FINDINGS: Levoscoliosis in the lumbar spine. Pelvic bony ring is intact. Right hip is located without a fracture. No gross abnormality to the left hip. Aortic atherosclerotic calcifications. IMPRESSION: No acute bone abnormality to the pelvis or right hip. Scoliosis in lumbar spine. Aortic atherosclerosis.  Electronically Signed   By: Markus Daft M.D.   On: 12/16/2018 16:07   Dg Femur, Min 2 Views Right  Result Date:  12/16/2018 CLINICAL DATA:  Right leg pain after fall several weeks ago. EXAM: RIGHT FEMUR 2 VIEWS; RIGHT KNEE - COMPLETE 4+ VIEW; RIGHT TIBIA AND FIBULA - 2 VIEW COMPARISON:  Right tibia and fibula x-rays dated November 15, 2016. FINDINGS: Right femur: The proximal femur is not included in the field of view. There is no evidence of fracture or other focal bone lesions. Osteopenia. Soft tissues are unremarkable. Vascular calcifications. Right knee: No acute fracture or dislocation. No joint effusion. Minimal tricompartmental joint space narrowing. Chondrocalcinosis of the menisci. Osteopenia. Soft tissues are unremarkable. Right tibia and fibula: There is no evidence of fracture or other focal bone lesions. The ankle is grossly unremarkable. Osteopenia. Soft tissues are unremarkable. Vascular calcifications. IMPRESSION: 1. No acute osseous abnormality. Electronically Signed   By: Titus Dubin M.D.   On: 12/16/2018 16:07       Subjective: Patient seen and examined at bedside this morning.  Remains comfortable.  Hemodynamically stable for discharge today.  Discharge Exam: Vitals:   12/31/18 0739 12/31/18 0757  BP: 108/64   Pulse:    Resp:    Temp:    SpO2:  96%   Vitals:   12/30/18 1945 12/30/18 2134 12/31/18 0739 12/31/18 0757  BP:  100/73 108/64   Pulse:  85    Resp:  18    Temp:  98.4 F (36.9 C)    TempSrc:  Oral    SpO2: 94% 95%  96%  Weight:      Height:        General: Pt is alert, awake, not in acute distress Cardiovascular: RRR, S1/S2 +, no rubs, no gallops Respiratory: CTA bilaterally, no wheezing, no rhonchi Abdominal: Soft, NT, ND, bowel sounds + Extremities: no edema, no cyanosis,clean surgical wound on the right hip    The results of significant diagnostics from this hospitalization (including imaging, microbiology, ancillary and laboratory) are listed below for reference.     Microbiology: Recent Results (from the past 240 hour(s))  SARS Coronavirus 2 [facility]  order, Performed in [place] hospital lab)     Status: None   Collection Time: 12/27/18  6:41 PM  Result Value Ref Range Status   SARS Coronavirus 2 NEGATIVE NEGATIVE Final    Comment: (NOTE) If result is NEGATIVE SARS-CoV-2 target nucleic acids are NOT DETECTED. The SARS-CoV-2 RNA is generally detectable in upper and lower  respiratory specimens during the acute phase of infection. The lowest  concentration of SARS-CoV-2 viral copies this assay can detect is 250  copies / mL. A negative result does not preclude SARS-CoV-2 infection  and should not be used as the sole basis for treatment or other  patient management decisions.  A negative result may occur with  improper specimen collection / handling, submission of specimen other  than nasopharyngeal swab, presence of viral mutation(s) within the  areas targeted by this assay, and inadequate number of viral copies  (<250 copies / mL). A negative result must be combined with clinical  observations, patient history, and epidemiological information. If result is POSITIVE SARS-CoV-2 target nucleic acids are DETECTED. The SARS-CoV-2 RNA is generally detectable in upper and lower  respiratory specimens dur ing the acute phase of infection.  Positive  results are indicative of active infection with SARS-CoV-2.  Clinical  correlation with patient history and other diagnostic information is  necessary to determine patient infection status.  Positive results do  not rule out bacterial infection or co-infection with other viruses. If result is PRESUMPTIVE POSTIVE SARS-CoV-2 nucleic acids MAY BE PRESENT.   A presumptive positive result was obtained on the submitted specimen  and confirmed on repeat testing.  While 2019 novel coronavirus  (SARS-CoV-2) nucleic acids may be present in the submitted sample  additional confirmatory testing may be necessary for epidemiological  and / or clinical management purposes  to differentiate between   SARS-CoV-2 and other Sarbecovirus currently known to infect humans.  If clinically indicated additional testing with an alternate test  methodology [phone]) is advised. The SARS-CoV-2 RNA is generally  detectable in upper and lower respiratory sp ecimens during the acute  phase of infection. The expected result is Negative. Fact Sheet for Patients:  StrictlyIdeas.no Fact Sheet for Healthcare Providers: BankingDealers.co.za This test is not yet approved or cleared by the [place] FDA and has been authorized for detection and/or diagnosis of SARS-CoV-2 by FDA under an Emergency Use Authorization (EUA).  This EUA will remain in effect (meaning this test can be used) for the duration of the COVID-19 declaration under Section 564(b)(1) of the Act, 21 U.S.C. section 360bbb-3(b)(1), unless the authorization is terminated or revoked sooner. Performed at [facility], [place] [address], [place], [place] [address]      Labs: BNP (last 3 results) No results for input(s): BNP in the last 8760 hours. Basic Metabolic Panel: Recent Labs  Lab 12/27/18 1333 12/28/18 0534 12/29/18 0656 12/30/18 0621  NA 139 139 136 138  K 3.9 4.0 3.8 3.9  CL 103 106 103 105  CO2 21* 23 25 25   GLUCOSE 150* 134* 134* 120*  BUN 17 27* 33* 31*  CREATININE 0.69 0.83 0.82 0.78  CALCIUM 9.7 9.6 9.3 8.7*   Liver Function Tests: No results for input(s): AST, ALT, ALKPHOS, BILITOT, PROT, ALBUMIN in the last 168 hours. No results for input(s): LIPASE, AMYLASE in the last 168 hours. No results for input(s): AMMONIA in the last 168 hours. CBC: Recent Labs  Lab 12/27/18 1333 12/28/18 0534 12/29/18 0656 12/30/18 0621 12/31/18 0632  WBC 14.0* 13.1* 10.9* 9.9 9.2  NEUTROABS 12.1*  --   --   --  7.6  HGB 13.2 12.1 9.7* 8.9* 8.5*  HCT 39.7 37.9 31.3* 28.3* 26.7*  MCV 95.7 99.7 100.6* 102.5* 101.5*  PLT 249 253 213 210 252   Cardiac  Enzymes: No results for input(s): CKTOTAL, CKMB, CKMBINDEX, TROPONINI in the last 168 hours. BNP: Invalid input(s): POCBNP CBG: No results for input(s): GLUCAP in the last 168 hours. D-Dimer No results for input(s): DDIMER in the last 72 hours. Hgb A1c No results for input(s): HGBA1C in the last 72 hours. Lipid Profile No results for input(s): CHOL, HDL, LDLCALC, TRIG, CHOLHDL, LDLDIRECT in the last 72 hours. Thyroid function studies No results for input(s): TSH, T4TOTAL, T3FREE, THYROIDAB in the last 72 hours.  Invalid input(s): FREET3 Anemia work up No results for input(s): VITAMINB12, FOLATE, FERRITIN, TIBC, IRON, RETICCTPCT in the last 72 hours. Urinalysis    Component Value Date/Time   COLORURINE YELLOW 12/28/2018 0812   APPEARANCEUR HAZY (A) 12/28/2018 0812   LABSPEC 1.024 12/28/2018 0812   PHURINE 5.0 12/28/2018 0812   GLUCOSEU NEGATIVE 12/28/2018 0812   HGBUR NEGATIVE 12/28/2018 0812   BILIRUBINUR NEGATIVE 12/28/2018 0812   KETONESUR 5 (A) 12/28/2018 0812   PROTEINUR NEGATIVE 12/28/2018 0812   NITRITE NEGATIVE 12/28/2018 6063  LEUKOCYTESUR TRACE (A) 12/28/2018 0812   Sepsis Labs Invalid input(s): PROCALCITONIN,  WBC,  LACTICIDVEN Microbiology Recent Results (from the past 240 hour(s))  SARS Coronavirus 2 [facility] order, Performed in [place] hospital lab)     Status: None   Collection Time: 12/27/18  6:41 PM  Result Value Ref Range Status   SARS Coronavirus 2 NEGATIVE NEGATIVE Final    Comment: (NOTE) If result is NEGATIVE SARS-CoV-2 target nucleic acids are NOT DETECTED. The SARS-CoV-2 RNA is generally detectable in upper and lower  respiratory specimens during the acute phase of infection. The lowest  concentration of SARS-CoV-2 viral copies this assay can detect is 250  copies / mL. A negative result does not preclude SARS-CoV-2 infection  and should not be used as the sole basis for treatment or other  patient management decisions.  A negative result  may occur with  improper specimen collection / handling, submission of specimen other  than nasopharyngeal swab, presence of viral mutation(s) within the  areas targeted by this assay, and inadequate number of viral copies  (<250 copies / mL). A negative result must be combined with clinical  observations, patient history, and epidemiological information. If result is POSITIVE SARS-CoV-2 target nucleic acids are DETECTED. The SARS-CoV-2 RNA is generally detectable in upper and lower  respiratory specimens dur ing the acute phase of infection.  Positive  results are indicative of active infection with SARS-CoV-2.  Clinical  correlation with patient history and other diagnostic information is  necessary to determine patient infection status.  Positive results do  not rule out bacterial infection or co-infection with other viruses. If result is PRESUMPTIVE POSTIVE SARS-CoV-2 nucleic acids MAY BE PRESENT.   A presumptive positive result was obtained on the submitted specimen  and confirmed on repeat testing.  While 2019 novel coronavirus  (SARS-CoV-2) nucleic acids may be present in the submitted sample  additional confirmatory testing may be necessary for epidemiological  and / or clinical management purposes  to differentiate between  SARS-CoV-2 and other Sarbecovirus currently known to infect humans.  If clinically indicated additional testing with an alternate test  methodology [phone]) is advised. The SARS-CoV-2 RNA is generally  detectable in upper and lower respiratory sp ecimens during the acute  phase of infection. The expected result is Negative. Fact Sheet for Patients:  StrictlyIdeas.no Fact Sheet for Healthcare Providers: BankingDealers.co.za This test is not yet approved or cleared by the [place] FDA and has been authorized for detection and/or diagnosis of SARS-CoV-2 by FDA under an Emergency Use Authorization (EUA).   This EUA will remain in effect (meaning this test can be used) for the duration of the COVID-19 declaration under Section 564(b)(1) of the Act, 21 U.S.C. section 360bbb-3(b)(1), unless the authorization is terminated or revoked sooner. Performed at [facility], [place] [address] [place], [place] [address]     Please note: You were cared for by a hospitalist during your hospital stay. Once you are discharged, your primary care physician will handle any further medical issues. Please note that NO REFILLS for any discharge medications will be authorized once you are discharged, as it is imperative that you return to your primary care physician (or establish a relationship with a primary care physician if you do not have one) for your post hospital discharge needs so that they can reassess your need for medications and monitor your lab values.    Time coordinating discharge: 40 minutes  SIGNED:   Shelly Coss, MD  [facility] 12/31/2018, 10:48 AM Pager [phone]  If 7PM-7AM, please contact night-coverage [email or web address] Password [email or web address]

## 2018-12-31 NOTE — Plan of Care (Signed)
  Problem: Education: Goal: Knowledge of the prescribed therapeutic regimen will improve Outcome: Progressing Goal: Understanding of discharge needs will improve Outcome: Progressing   Problem: Activity: Goal: Ability to avoid complications of mobility impairment will improve Outcome: Progressing Goal: Ability to tolerate increased activity will improve Outcome: Progressing   Problem: Clinical Measurements: Goal: Postoperative complications will be avoided or minimized Outcome: Progressing   Problem: Pain Management: Goal: Pain level will decrease with appropriate interventions Outcome: Progressing   

## 2018-12-31 NOTE — Progress Notes (Cosign Needed Addendum)
Durable Medical Equipment  (From admission, onward)         Start     Ordered   12/31/18 1054  For home use only DME Tub bench  Once     12/31/18 1053   12/31/18 0904  For home use only DME Hospital bed  Once    Question Answer Comment  Patient has (list medical condition): hip fracture   Bed type Semi-electric      12/31/18 0904   12/31/18 0904  For home use only DME Bedside commode  Once    Question:  Patient needs a bedside commode to treat with the following condition  Answer:  Hip fracture ([place])   12/31/18 0904         Patient spends ____100________ % of the time in bed. ________rt hip fracture______________________________ causes frequent                          (% of time in bed)                                           (Problems with aspiration / dysphagia)  _________pain________. Torso required to be elevated at least _____30 degrees________ to prevent aspiration. Bed wedges do not provide  (gagging / choking)                                  (30 or more)  adequate elevation to resolve issues with aspiration. ______Chocking requires frequent and immediate changes in body                                                                                                    (Gagging / choking)  position which cannot be achieved with a normal bed.  Chronic Pain  Patient suffers from chronic ______rt hip pain__________________ pain which is caused by _________fracture________________________.                                                                 (body part(s))                                                              (health condition responsible for pain)  Hospital bed will ______allivate____________ pain by allowing _______rt hip_____________ to be positioned in ways not feasible with a                                (  alleviate / eliminate)                                          body part(s))  normal bed. Pain episodes  _____frequently______________________ require frequent and immediate changes in body position which                                                    (frequently / upon awakening)  cannot be achieved with a normal bed. Bed will be need for at least 3 months

## 2018-12-31 NOTE — TOC Transition Note (Signed)
Transition of Care [facility]) - CM/SW Discharge Note   Patient Details  Name: Carol Massey MRN: [id number] Date of Birth: 07-07-1926  Transition of Care [facility]) CM/SW Contact:  Servando Snare, LCSW Phone Number: 12/31/2018, 11:13 AM   Clinical Narrative:   Patient dcing to home. LCSW ordered [facility] RN, PT and DME.     Final next level of care: [place] Barriers to Discharge: Continued Medical Work up   Patient Goals and CMS Choice Patient states their goals for this hospitalization and ongoing recovery are:: return home CMS Medicare.gov Compare Post Acute Care list provided to:: Patient Represenative (must comment)(Son, [name]) Choice offered to / list presented to : NA  Discharge Placement                       Discharge Plan and Services   Discharge Planning Services: NA Post Acute Care Choice: Durable Medical Equipment, Home Health                               Social Determinants of Health (SDOH) Interventions     Readmission Risk Interventions Readmission Risk Prevention Plan 12/30/2018  Transportation Screening Complete  PCP or Specialist Appt within 5-7 Days Not Complete  Home Care Screening Complete  Medication Review (RN CM) Not Complete  Some recent data might be hidden

## 2018-12-31 NOTE — Discharge Instructions (Signed)
INSTRUCTIONS AFTER SURGERY  o Remove items at home which could result in a fall. This includes throw rugs or furniture in walking pathways o ICE to the affected joint every three hours while awake for 30 minutes at a time, for at least the first 3-5 days, and then as needed for pain and swelling.  Continue to use ice for pain and swelling. You may notice swelling that will progress down to the foot and ankle.  This is normal after surgery.  Elevate your leg when you are not up walking on it.   o Continue to use the breathing machine you got in the hospital (incentive spirometer) which will help keep your temperature down.  It is common for your temperature to cycle up and down following surgery, especially at night when you are not up moving around and exerting yourself.  The breathing machine keeps your lungs expanded and your temperature down.   DIET:  As you were doing prior to hospitalization, we recommend a well-balanced diet.  DRESSING / WOUND CARE / SHOWERING  Keep the surgical dressing until follow up.  The dressing is water proof, so you can shower without any extra covering.  IF THE DRESSING FALLS OFF or the wound gets wet inside, change the dressing with sterile gauze.  Please use good hand washing techniques before changing the dressing.  Do not use any lotions or creams on the incision until instructed by your surgeon.    ACTIVITY  o Increase activity slowly as tolerated, but follow the weight bearing instructions below.   o No driving for 6 weeks or until further direction given by your physician.  You cannot drive while taking narcotics.  o No lifting or carrying greater than 10 lbs. until further directed by your surgeon. o Avoid periods of inactivity such as sitting longer than an hour when not asleep. This helps prevent blood clots.  o You may return to work once you are authorized by your doctor.     WEIGHT BEARING   Partial weight bearing with assist device as directed.   .   CONSTIPATION  Constipation is defined medically as fewer than three stools per week and severe constipation as less than one stool per week.  Even if you have a regular bowel pattern at home, your normal regimen is likely to be disrupted due to multiple reasons following surgery.  Combination of anesthesia, postoperative narcotics, change in appetite and fluid intake all can affect your bowels.   YOU MUST use at least one of the following options; they are listed in order of increasing strength to get the job done.  They are all available over the counter, and you may need to use some, POSSIBLY even all of these options:    Drink plenty of fluids (prune juice may be helpful) and high fiber foods Colace 100 mg by mouth twice a day  Senokot for constipation as directed and as needed Dulcolax (bisacodyl), take with full glass of water  Miralax (polyethylene glycol) once or twice a day as needed.  If you have tried all these things and are unable to have a bowel movement in the first 3-4 days after surgery call either your surgeon or your primary doctor.    If you experience loose stools or diarrhea, hold the medications until you stool forms back up.  If your symptoms do not get better within 1 week or if they get worse, check with your doctor.  If you experience "the worst abdominal  pain ever" or develop nausea or vomiting, please contact the office immediately for further recommendations for treatment.   ITCHING:  If you experience itching with your medications, try taking only a single pain pill, or even half a pain pill at a time.  You can also use Benadryl over the counter for itching or also to help with sleep.   TED HOSE STOCKINGS:  Use stockings on both legs until for at least 2 weeks or as directed by physician office. They may be removed at night for sleeping.  MEDICATIONS:  See your medication summary on the After Visit Summary that nursing will review with you.  You may have  some home medications which will be placed on hold until you complete the course of blood thinner medication.  It is important for you to complete the blood thinner medication as prescribed.  PRECAUTIONS:  If you experience chest pain or shortness of breath - call 911 immediately for transfer to the hospital emergency department.   If you develop a fever greater that 101 F, purulent drainage from wound, increased redness or drainage from wound, foul odor from the wound/dressing, or calf pain - CONTACT YOUR SURGEON.                                                   FOLLOW-UP APPOINTMENTS:  If you do not already have a post-op appointment, please call the office for an appointment to be seen by your surgeon.  Guidelines for how soon to be seen are listed in your After Visit Summary, but are typically between 1-4 weeks after surgery.  MAKE SURE YOU:   Understand these instructions.   Get help right away if you are not doing well or get worse.    Thank you for letting us be a part of your medical care team.  It is a privilege we respect greatly.  We hope these instructions will help you stay on track for a fast and full recovery!     Hip Rehabilitation in the Home After hip surgery, it is important to follow instructions from your health care provider about rehabilitation (rehab). It is important to design a program that is safe and effective for you. Your health care provider and rehab therapist will work with you to meet your specific abilities and needs. What are the benefits? Hip rehab can help to:  Strengthen your hip.  Improve the flexibility and movement (range of motion) of your hip joint.  Reduce swelling.  Improve blood flow and prevent blood clots. How to do exercises at home   Continue exercises at home that your health care provider or rehab therapist instructed you to do in the hospital.  Before you exercise: ? Take pain medicines, if told by your health care  provider. Do not take the medicine if it makes you feel dizzy or sleepy. ? Do a warm-up activity, such as gentle walking, as told by your health care provider. This warms up your muscles and helps prevent injury.  While doing exercises: ? When standing, make sure you are near something sturdy that you can hold onto for balance, such as a heavy chair or a wall. ? Do exercises exactly as told by your health care provider and adjust them as directed. ? As you are recovering, choose an exercise pace that is comfortable for  you, and gradually work up to your goal. ? Follow activity restrictions as told by your health care provider. This may vary depending on the type of hip surgery you had. For example, your health care provider may instruct you to:  Not bring your knees higher than your hips.  Not cross your legs.  Not twist while lying down or standing.  Avoid rotating the toes of your affected leg inward. Keep your toes pointing straight ahead.  Do not exercise in a pool (aquatic therapy) until your incision from surgery is healed and your health care provider says that you can. Follow these instructions at home: Activity  Do not use your affected leg to support your body weight until your health care provider says that you can. Use crutches or a walker as told by your health care provider.  Ask your health care provider what activities are safe for you during recovery, and ask what activities you need to avoid.  Avoid sitting for a long time without moving. Get up to take short walks every 1-2 hours. Ask for help if you feel weak or unsteady. Managing pain, stiffness, and swelling   Put ice on affected areas after you exercise, or as needed. Icing can help to relieve joint pain and swelling. ? Put ice in a plastic bag that you were given. ? Place a towel between your skin and the bag. ? Leave the ice on for 20 minutes, 2-3 times a day.  If directed, apply heat to affected areas  before you exercise, or as needed. Heat can reduce muscle and joint stiffness. Use the heat source that your health care provider recommends, such as a moist heat pack or a heating pad. ? Place a towel between your skin and the heat source. ? Leave the heat on for 20-30 minutes. ? Remove the heat if your skin turns bright red. This is especially important if you are unable to feel pain, heat, or cold. You may have a greater risk of getting burned.  Wear compression stockings as told by your health care provider. These stockings help to prevent blood clots and reduce swelling in your legs.  Raise (elevate) your legs while sitting or lying down. Preventing falls  Keep your home well-lit and clutter-free, especially in walkways and stairways. Keep floors dry and use non-skid mats.  Remove tripping hazards from floors, such as throw rugs and cords.  Install grab bars in bathrooms, and put night-lights in your bedroom and bathroom.  Wear closed-toe shoes that fit well and support your feet. Wear shoes that have rubber soles or low heels.  Talk with your health care provider about the over-the-counter and prescription medicines that you are taking. Some medicines may increase your risk of falling. General recommendations  Teach your family about your condition and how they can participate in your recovery. Try bringing your family members with you to a physical therapy session.  Take over-the-counter and prescription medicines only as told by your health care provider.  Keep all follow-up visits as told by your health care provider and rehab therapist. This is important. Questions to ask your health care provider  What exercises are safe for me to do?  How often should I do the exercises?  How can I manage pain during exercise?  What other activities are safe for me to do? Contact a health care provider if:  You have questions about how to do exercises correctly.  You have hip or  groin pain that  does not go away after resting and taking pain medicine.  Your artificial hip (prosthesis) feels loose.  You are not able to do exercises. Get help right away if:  You fall.  Your incision from surgery breaks open.  Your affected leg is shorter than the other leg, and this is a new problem. Summary  Hip rehab can help to strengthen your hip and improve the flexibility and movement of your hip joint.  Continue exercises at home that your health care provider or physical therapist instructed you to do in the hospital.  Contact your health care provider if you have questions about how to do exercises correctly. This information is not intended to replace advice given to you by your health care provider. Make sure you discuss any questions you have with your health care provider. Document Released: 03/15/2004 Document Revised: 07/28/2017 Document Reviewed: 07/28/2017 Elsevier Interactive Patient Education  2019 [place].  Aspirin, ASA oral tablets What is this medicine? ASPIRIN (AS pir in) is a pain reliever. It is used to treat mild pain and fever. This medicine is also used as directed by a doctor to prevent and to treat heart attacks, to prevent strokes and blood clots, and to treat arthritis or inflammation. This medicine may be used for other purposes; ask your health care provider or pharmacist if you have questions. COMMON BRAND NAME(S): Aspir-Low, Aspir-Trin, Aspirtab, Bayer Advanced Aspirin, Bayer Aspirin, Bayer Aspirin Extra Strength, Bayer Aspirin Plus, Nature conservation officer, Nature conservation officer Plus, Bayer Genuine Aspirin, Bayer Womens Aspirin, Bufferin, Bufferin Extra Strength, Bufferin Low Dose What should I tell my health care provider before I take this medicine? They need to know if you have any of these conditions: -anemia -asthma -bleeding problems -child with chickenpox, the flu, or other viral infection -diabetes -gout -if you frequently drink  alcohol containing drinks -kidney disease -liver disease -low level of vitamin K -lupus -smoke tobacco -stomach ulcers or other problems -an unusual or allergic reaction to aspirin, tartrazine dye, other medicines, dyes, or preservatives -pregnant or trying to get pregnant -breast-feeding How should I use this medicine? Take this medicine by mouth with a glass of water. Follow the directions on the package or prescription label. You can take this medicine with or without food. If it upsets your stomach, take it with food. Do not take your medicine more often than directed. Talk to your pediatrician regarding the use of this medicine in children. While this drug may be prescribed for children as young as 75 years of age for selected conditions, precautions do apply. Children and teenagers should not use this medicine to treat chicken pox or flu symptoms unless directed by a doctor. Patients over 33 years old may have a stronger reaction and need a smaller dose. Overdosage: If you think you have taken too much of this medicine contact a poison control center or emergency room at once. NOTE: This medicine is only for you. Do not share this medicine with others. What if I miss a dose? If you are taking this medicine on a regular schedule and miss a dose, take it as soon as you can. If it is almost time for your next dose, take only that dose. Do not take double or extra doses. What may interact with this medicine? Do not take this medicine with any of the following medications: -cidofovir -ketorolac -probenecid This medicine may also interact with the following medications: -alcohol -alendronate -bismuth subsalicylate -flavocoxid -herbal supplements like feverfew, garlic, ginger, ginkgo biloba,  horse chestnut -medicines for diabetes or glaucoma like acetazolamide, methazolamide -medicines for gout -medicines that treat or prevent blood clots like enoxaparin, heparin, ticlopidine,  warfarin -other aspirin and aspirin-like medicines -NSAIDs, medicines for pain and inflammation, like ibuprofen or naproxen -pemetrexed -sulfinpyrazone -varicella live vaccine This list may not describe all possible interactions. Give your health care provider a list of all the medicines, herbs, non-prescription drugs, or dietary supplements you use. Also tell them if you smoke, drink alcohol, or use illegal drugs. Some items may interact with your medicine. What should I watch for while using this medicine? If you are treating yourself for pain, tell your doctor or health care professional if the pain lasts more than 10 days, if it gets worse, or if there is a new or different kind of pain. Tell your doctor if you see redness or swelling. Also, check with your doctor if you have a fever that lasts for more than 3 days. Only take this medicine to prevent heart attacks or blood clotting if prescribed by your doctor or health care professional. Do not take aspirin or aspirin-like medicines with this medicine. Too much aspirin can be dangerous. Always read the labels carefully. This medicine can irritate your stomach or cause bleeding problems. Do not smoke cigarettes or drink alcohol while taking this medicine. Do not lie down for 30 minutes after taking this medicine to prevent irritation to your throat. If you are scheduled for any medical or dental procedure, tell your healthcare provider that you are taking this medicine. You may need to stop taking this medicine before the procedure. This medicine may be used to treat migraines. If you take migraine medicines for 10 or more days a month, your migraines may get worse. Keep a diary of headache days and medicine use. Contact your healthcare professional if your migraine attacks occur more frequently. What side effects may I notice from receiving this medicine? Side effects that you should report to your doctor or health care professional as soon as  possible: -allergic reactions like skin rash, itching or hives, swelling of the face, lips, or tongue -breathing problems -changes in hearing, ringing in the ears -confusion -general ill feeling or flu-like symptoms -pain on swallowing -redness, blistering, peeling or loosening of the skin, including inside the mouth or nose -signs and symptoms of bleeding such as bloody or black, tarry stools; red or dark-brown urine; spitting up blood or brown material that looks like coffee grounds; red spots on the skin; unusual bruising or bleeding from the eye, gums, or nose -trouble passing urine or change in the amount of urine -unusually weak or tired -yellowing of the eyes or skin Side effects that usually do not require medical attention (report to your doctor or health care professional if they continue or are bothersome): -diarrhea or constipation -headache -nausea, vomiting -stomach gas, heartburn This list may not describe all possible side effects. Call your doctor for medical advice about side effects. You may report side effects to FDA at 1-800-FDA-1088. Where should I keep my medicine? Keep out of the reach of children. Store at room temperature between 15 and 30 degrees C (59 and 86 degrees F). Protect from heat and moisture. Do not use this medicine if it has a strong vinegar smell. Throw away any unused medicine after the expiration date. NOTE: This sheet is a summary. It may not cover all possible information. If you have questions about this medicine, talk to your doctor, pharmacist, or health care provider.  2019 Elsevier/Gold Standard (2016-09-24 10:42:13)  Iron tablets, capsules, extended-release tablets What is this medicine? IRON (AHY ern) replaces iron that is essential to healthy red blood cells. Iron is used to treat iron deficiency anemia. Anemia may cause problems like tiredness, shortness of breath, or slowed growth in children. Only take iron if your doctor has told you  to. Do not treat yourself with iron if you are feeling tired. Most healthy people get enough iron in their diets, particularly if they eat cereals, meat, poultry, and fish. This medicine may be used for other purposes; ask your health care provider or pharmacist if you have questions. COMMON BRAND NAME(S): Cheri Kearns, [place], [place], Feosol Complete, [employer], [place], [place], [place], [place], [place], Barrister's clerk, Ferrocite, Hemocyte, Nephro-Fer, NovaFerrum, ProFe, Proferrin ES, Slow Fe, Slow Iron, Tandem What should I tell my health care provider before I take this medicine? They need to know if you have any of these conditions: -frequently drink alcohol -bowel disease -hemolytic anemia -iron overload (hemochromatosis, hemosiderosis) -liver disease -problems with swallowing -stomach ulcer or other stomach problems -an unusual or allergic reaction to iron, other medicines, foods, dyes, or preservatives -pregnant or trying to get pregnant -breast-feeding How should I use this medicine? Take this medicine by mouth with a glass of water or fruit juice. Follow the directions on the prescription label. Swallow whole. Do not crush or chew. Take this medicine in an upright or sitting position. Try to take any bedtime doses at least 10 minutes before lying down. You may take this medicine with food. Take your medicine at regular intervals. Do not take your medicine more often than directed. Do not stop taking except on your doctor's advice. Talk to your pediatrician regarding the use of this medicine in children. While this drug may be prescribed for selected conditions, precautions do apply. Overdosage: If you think you have taken too much of this medicine contact a poison control center or emergency room at once. NOTE: This medicine is only for you. Do not share this medicine with others. What if I miss a dose? If you miss a dose, take it as soon as you can. If it is almost  time for your next dose, take only that dose. Do not take double or extra doses. What may interact with this medicine? If you are taking this iron product, you should not take iron in any other medicine or dietary supplement. This medicine may also interact with the following medications: -alendronate -antacids -cefdinir -chloramphenicol -cholestyramine -deferoxamine -dimercaprol -etidronate -medicines for stomach ulcers or other stomach problems -pancreatic enzymes -quinolone antibiotics (examples: Cipro, Floxin, Levaquin, Tequin and others) -risedronate -tetracycline antibiotics (examples: doxycycline, tetracycline, minocycline, and others) -thyroid hormones This list may not describe all possible interactions. Give your health care provider a list of all the medicines, herbs, non-prescription drugs, or dietary supplements you use. Also tell them if you smoke, drink alcohol, or use illegal drugs. Some items may interact with your medicine. What should I watch for while using this medicine? Use iron supplements only as directed by your health care professional. Dennis Bast will need important blood work while you are taking this medicine. It may take 3 to 6 months of therapy to treat low iron levels. Pregnant women should follow the dose and length of iron treatment as directed by their doctors. Do not use iron longer than prescribed, and do not take a higher dose than recommended. Long-term use may cause excess iron to build-up in the body. Do not take  iron with antacids. If you need to take an antacid, take it 2 hours after a dose of iron. What side effects may I notice from receiving this medicine? Side effects that you should report to your doctor or health care professional as soon as possible: -allergic reactions like skin rash, itching or hives, swelling of the face, lips, or tongue -blue lips, nails, or palms -dark colored stools (this may be due to the iron, but can indicate a more  serious condition) -drowsiness -pain with or difficulty swallowing -pale or clammy skin -seizures -stomach pain -unusually weak or tired -vomiting -weak, fast, or irregular heartbeat Side effects that usually do not require medical attention (report to your doctor or health care professional if they continue or are bothersome): -constipation -indigestion -nausea or stomach upset This list may not describe all possible side effects. Call your doctor for medical advice about side effects. You may report side effects to FDA at 1-800-FDA-1088. Where should I keep my medicine? Keep out of the reach of children. Even small amounts of iron can be harmful to a child. Store at room temperature between 15 and 30 degrees C (59 and 86 degrees F). Keep container tightly closed. Throw away any unused medicine after the expiration date. NOTE: This sheet is a summary. It may not cover all possible information. If you have questions about this medicine, talk to your doctor, pharmacist, or health care provider.  2019 Elsevier/Gold Standard (2007-12-29 17:03:41)  Methocarbamol tablets What is this medicine? METHOCARBAMOL (meth oh KAR ba mole) helps to relieve pain and stiffness in muscles caused by strains, sprains, or other injury to your muscles. This medicine may be used for other purposes; ask your health care provider or pharmacist if you have questions. COMMON BRAND NAME(S): Robaxin What should I tell my health care provider before I take this medicine? They need to know if you have any of these conditions: -kidney disease -seizures -an unusual or allergic reaction to methocarbamol, other medicines, foods, dyes, or preservatives -pregnant or trying to get pregnant -breast-feeding How should I use this medicine? Take this medicine by mouth with a full glass of water. Follow the directions on the prescription label. Take your medicine at regular intervals. Do not take your medicine more often than  directed. Talk to your pediatrician regarding the use of this medicine in children. Special care may be needed. Overdosage: If you think you have taken too much of this medicine contact a poison control center or emergency room at once. NOTE: This medicine is only for you. Do not share this medicine with others. What if I miss a dose? If you miss a dose, take it as soon as you can. If it is almost time for your next dose, take only the next dose. Do not take double or extra doses. What may interact with this medicine? Do not take this medication with any of the following medicines: -narcotic medicines for cough This medicine may also interact with the following medications: -alcohol -antihistamines for allergy, cough and cold -certain medicines for anxiety or sleep -certain medicines for depression like amitriptyline, fluoxetine, sertraline -certain medicines for seizures like phenobarbital, primidone -cholinesterase inhibitors like neostigmine, ambenonium, and pyridostigmine bromide -general anesthetics like halothane, isoflurane, methoxyflurane, propofol -local anesthetics like lidocaine, pramoxine, tetracaine -medicines that relax muscles for surgery -narcotic medicines for pain -phenothiazines like chlorpromazine, mesoridazine, prochlorperazine, thioridazine This list may not describe all possible interactions. Give your health care provider a list of all the medicines, herbs, non-prescription drugs,  or dietary supplements you use. Also tell them if you smoke, drink alcohol, or use illegal drugs. Some items may interact with your medicine. What should I watch for while using this medicine? Tell your doctor or health care professional if your symptoms do not start to get better or if they get worse. You may get drowsy or dizzy. Do not drive, use machinery, or do anything that needs mental alertness until you know how this medicine affects you. Do not stand or sit up quickly, especially if  you are an older patient. This reduces the risk of dizzy or fainting spells. Alcohol may interfere with the effect of this medicine. Avoid alcoholic drinks. If you are taking another medicine that also causes drowsiness, you may have more side effects. Give your health care provider a list of all medicines you use. Your doctor will tell you how much medicine to take. Do not take more medicine than directed. Call emergency for help if you have problems breathing or unusual sleepiness. What side effects may I notice from receiving this medicine? Side effects that you should report to your doctor or health care professional as soon as possible: -allergic reactions like skin rash, itching or hives, swelling of the face, lips, or tongue -breathing problems -confusion -seizures -unusually weak or tired Side effects that usually do not require medical attention (report to your doctor or health care professional if they continue or are bothersome): -dizziness -headache -metallic taste -tiredness -upset stomach This list may not describe all possible side effects. Call your doctor for medical advice about side effects. You may report side effects to FDA at 1-800-FDA-1088. Where should I keep my medicine? Keep out of the reach of children. Store at room temperature between 20 and 25 degrees C (68 and 77 degrees F). Keep container tightly closed. Throw away any unused medicine after the expiration date. NOTE: This sheet is a summary. It may not cover all possible information. If you have questions about this medicine, talk to your doctor, pharmacist, or health care provider.  2019 Elsevier/Gold Standard (2015-05-23 13:11:54)  Oxycodone tablets or capsules What is this medicine? OXYCODONE (ox i KOE done) is a pain reliever. It is used to treat moderate to severe pain. This medicine may be used for other purposes; ask your health care provider or pharmacist if you have questions. COMMON BRAND NAME(S):  Dazidox, Endocodone, Oxaydo, OXECTA, OxyIR, Percolone, Roxicodone, Roxybond What should I tell my health care provider before I take this medicine? They need to know if you have any of these conditions: -Addison's disease -brain tumor -head injury -heart disease -history of drug or alcohol abuse problem -if you often drink alcohol -kidney disease -liver disease -lung or breathing disease, like asthma -mental illness -pancreatic disease -seizures -thyroid disease -an unusual or allergic reaction to oxycodone, codeine, hydrocodone, morphine, other medicines, foods, dyes, or preservatives -pregnant or trying to get pregnant -breast-feeding How should I use this medicine? Take this medicine by mouth with a glass of water. Follow the directions on the prescription label. You can take it with or without food. If it upsets your stomach, take it with food. Take your medicine at regular intervals. Do not take it more often than directed. Do not stop taking except on your doctor's advice. Some brands of this medicine, like Oxecta, have special instructions. Ask your doctor or pharmacist if these directions are for you: Do not cut, crush or chew this medicine. Swallow only one tablet at a time. Do not wet,  soak, or lick the tablet before you take it. A special MedGuide will be given to you by the pharmacist with each prescription and refill. Be sure to read this information carefully each time. Talk to your pediatrician regarding the use of this medicine in children. Special care may be needed. Overdosage: If you think you have taken too much of this medicine contact a poison control center or emergency room at once. NOTE: This medicine is only for you. Do not share this medicine with others. What if I miss a dose? If you miss a dose, take it as soon as you can. If it is almost time for your next dose, take only that dose. Do not take double or extra doses. What may interact with this  medicine? This medicine may interact with the following medications: -alcohol -antihistamines for allergy, cough and cold -antiviral medicines for HIV or AIDS -atropine -certain antibiotics like clarithromycin, erythromycin, linezolid, rifampin -certain medicines for anxiety or sleep -certain medicines for bladder problems like oxybutynin, tolterodine -certain medicines for depression like amitriptyline, fluoxetine, sertraline -certain medicines for fungal infections like ketoconazole, itraconazole, voriconazole -certain medicines for migraine headache like almotriptan, eletriptan, frovatriptan, naratriptan, rizatriptan, sumatriptan, zolmitriptan -certain medicines for nausea or vomiting like dolasetron, ondansetron, palonosetron -certain medicines for Parkinson's disease like benztropine, trihexyphenidyl -certain medicines for seizures like phenobarbital, phenytoin, primidone -certain medicines for stomach problems like dicyclomine, hyoscyamine -certain medicines for travel sickness like scopolamine -diuretics -general anesthetics like halothane, isoflurane, methoxyflurane, propofol -ipratropium -local anesthetics like lidocaine, pramoxine, tetracaine -MAOIs like Carbex, Eldepryl, Marplan, Nardil, and Parnate -medicines that relax muscles for surgery -methylene blue -nilotinib -other narcotic medicines for pain or cough -phenothiazines like chlorpromazine, mesoridazine, prochlorperazine, thioridazine This list may not describe all possible interactions. Give your health care provider a list of all the medicines, herbs, non-prescription drugs, or dietary supplements you use. Also tell them if you smoke, drink alcohol, or use illegal drugs. Some items may interact with your medicine. What should I watch for while using this medicine? Tell your doctor or health care professional if your pain does not go away, if it gets worse, or if you have new or a different type of pain. You may develop  tolerance to the medicine. Tolerance means that you will need a higher dose of the medicine for pain relief. Tolerance is normal and is expected if you take this medicine for a long time. Do not suddenly stop taking your medicine because you may develop a severe reaction. Your body becomes used to the medicine. This does NOT mean you are addicted. Addiction is a behavior related to getting and using a drug for a non-medical reason. If you have pain, you have a medical reason to take pain medicine. Your doctor will tell you how much medicine to take. If your doctor wants you to stop the medicine, the dose will be slowly lowered over time to avoid any side effects. There are different types of narcotic medicines (opiates). If you take more than one type at the same time or if you are taking another medicine that also causes drowsiness, you may have more side effects. Give your health care provider a list of all medicines you use. Your doctor will tell you how much medicine to take. Do not take more medicine than directed. Call emergency for help if you have problems breathing or unusual sleepiness. You may get drowsy or dizzy. Do not drive, use machinery, or do anything that needs mental alertness until you know how  the medicine affects you. Do not stand or sit up quickly, especially if you are an older patient. This reduces the risk of dizzy or fainting spells. Alcohol may interfere with the effect of this medicine. Avoid alcoholic drinks. This medicine will cause constipation. Try to have a bowel movement at least every 2 to 3 days. If you do not have a bowel movement for 3 days, call your doctor or health care professional. Your mouth may get dry. Chewing sugarless gum or sucking hard candy, and drinking plenty of water may help. Contact your doctor if the problem does not go away or is severe. What side effects may I notice from receiving this medicine? Side effects that you should report to your doctor or  health care professional as soon as possible: -allergic reactions like skin rash, itching or hives, swelling of the face, lips, or tongue -breathing problems -confusion -signs and symptoms of low blood pressure like dizziness; feeling faint or lightheaded, falls; unusually weak or tired -trouble passing urine or change in the amount of urine -trouble swallowing Side effects that usually do not require medical attention (report to your doctor or health care professional if they continue or are bothersome): -constipation -dry mouth -nausea, vomiting -tiredness This list may not describe all possible side effects. Call your doctor for medical advice about side effects. You may report side effects to FDA at 1-800-FDA-1088. Where should I keep my medicine? Keep out of the reach of children. This medicine can be abused. Keep your medicine in a safe place to protect it from theft. Do not share this medicine with anyone. Selling or giving away this medicine is dangerous and against the law. Store at room temperature between 15 and 30 degrees C (59 and 86 degrees F). Protect from light. Keep container tightly closed. This medicine may cause harm and death if it is taken by other adults, children, or pets. Return medicine that has not been used to an official disposal site. Contact the DEA at 6144380237 or your city/county government to find a site. If you cannot return the medicine, flush it down the toilet. Do not use the medicine after the expiration date. NOTE: This sheet is a summary. It may not cover all possible information. If you have questions about this medicine, talk to your doctor, pharmacist, or health care provider.  2019 Elsevier/Gold Standard (2016-12-17 16:13:10)  Polyethylene Glycol powder What is this medicine? POLYETHYLENE GLYCOL 3350 (pol ee ETH i leen; GLYE col) powder is a laxative used to treat constipation. It increases the amount of water in the stool. Bowel movements  become easier and more frequent. This medicine may be used for other purposes; ask your health care provider or pharmacist if you have questions. COMMON BRAND NAME(S): Sharlyn Bologna, GlycoLax, Healthylax, MiraLax, Smooth LAX, Vita Health What should I tell my health care provider before I take this medicine? They need to know if you have any of these conditions: -a history of blockage of the stomach or intestine -current abdomen distension or pain -difficulty swallowing -diverticulitis, ulcerative colitis, or other chronic bowel disease -phenylketonuria -an unusual or allergic reaction to polyethylene glycol, other medicines, dyes, or preservatives -pregnant or trying to get pregnant -breast-feeding How should I use this medicine? Take this medicine by mouth. The bottle has a measuring cap that is marked with a line. Pour the powder into the cap up to the marked line (the dose is about 1 heaping tablespoon). Add the powder in the cap to a full  glass (4 to 8 ounces or 120 to 240 mL) of water, juice, soda, coffee or tea. Mix the powder well. Ensure that the powder is fully dissolved. Do not drink if there are any clumps. Drink the solution. Take exactly as directed. Do not take your medicine more often than directed. Talk to your pediatrician regarding the use of this medicine in children. Special care may be needed. Overdosage: If you think you have taken too much of this medicine contact a poison control center or emergency room at once. NOTE: This medicine is only for you. Do not share this medicine with others. What if I miss a dose? If you miss a dose, take it as soon as you can. If it is almost time for your next dose, take only that dose. Do not take double or extra doses. What may interact with this medicine? Interactions are not expected. This list may not describe all possible interactions. Give your health care provider a list of all the medicines, herbs, non-prescription drugs, or  dietary supplements you use. Also tell them if you smoke, drink alcohol, or use illegal drugs. Some items may interact with your medicine. What should I watch for while using this medicine? Do not use for more than 2 weeks without advice from your doctor or health care professional. It can take 2 to 4 days to have a bowel movement and to experience improvement in constipation. See your health care professional for any changes in bowel habits, including constipation, that are severe or last longer than three weeks. Always take this medicine with plenty of water. What side effects may I notice from receiving this medicine? Side effects that you should report to your doctor or health care professional as soon as possible: -diarrhea -difficulty breathing -itching of the skin, hives, or skin rash -severe bloating, pain, or distension of the stomach -vomiting Side effects that usually do not require medical attention (report to your doctor or health care professional if they continue or are bothersome): -bloating or gas -lower abdominal discomfort or cramps -nausea This list may not describe all possible side effects. Call your doctor for medical advice about side effects. You may report side effects to FDA at 1-800-FDA-1088. Where should I keep my medicine? Keep out of the reach of children. Store between 15 and 30 degrees C (59 and 86 degrees F). Throw away any unused medicine after the expiration date. NOTE: This sheet is a summary. It may not cover all possible information. If you have questions about this medicine, talk to your doctor, pharmacist, or health care provider.  2019 Elsevier/Gold Standard (2018-01-29 10:42:01)

## 2018-12-31 NOTE — Progress Notes (Signed)
Physical Therapy Treatment Patient Details Name: Carol Massey MRN: [id number] DOB: [birth year] Today's Date: 12/31/2018    History of Present Illness 85 y.o. female with past medical history significant for iron deficiency anemia, hypertension, COPD, gastroesophageal reflux disease, depression, anxiety and osteoporosis and admitted with femur fracture s/p Right IM nail 12/28/18    PT Comments    POD # 3 Pt is sweet and very sharpe for her age.  She is aware of her hip surgery and recalls her fall.  Assisted OOB to attempt amb. General Gait Details: pt was only able to take a few forward steps due to R hip pain and inability to tolerate WBing.  BSC and recliner switch from behind as pt stood. General Gait Details: pt was only able to take a few forward steps due to R hip pain and inability to tolerate WBing.  BSC and recliner switch from behind as pt stood.    Follow Up Recommendations  SNF(son plans to have pt D/C back to his home )     Equipment Recommendations       Recommendations for Other Services       Precautions / Restrictions Precautions Precautions: Fall Precaution Comments: HOH, low vision Restrictions Weight Bearing Restrictions: Yes RLE Weight Bearing: Partial weight bearing    Mobility  Bed Mobility Overal bed mobility: Needs Assistance Bed Mobility: Supine to Sit     Supine to sit: Max assist;HOB elevated     General bed mobility comments: assist for upper and lower body, utilized bed pads for positioning  Transfers Overall transfer level: Needs assistance Equipment used: None;Rolling walker (2 wheeled) Transfers: Sit to/from [facility] Sit to Stand: Mod assist Stand pivot transfers: Max assist       General transfer comment: 50% VC's on safety and turn completion.  Assisted from bed to [facility] then from [facility] to attempt standing  Ambulation/Gait Ambulation/Gait assistance: Max assist;+2 safety/equipment;+2 physical assistance Gait Distance  (Feet): 1 Feet Assistive device: Rolling walker (2 wheeled) Gait Pattern/deviations: Step-to pattern;Step-through pattern;Decreased stance time - right Gait velocity: decreased    General Gait Details: pt was only able to take a few forward steps due to R hip pain and inability to tolerate WBing.  BSC and recliner switch from behind as pt stood.    Stairs             Wheelchair Mobility    Modified Rankin (Stroke Patients Only)       Balance                                            Cognition Arousal/Alertness: Awake/alert Behavior During Therapy: WFL for tasks assessed/performed Overall Cognitive Status: Within Functional Limits for tasks assessed                                 General Comments: AxO x 4   pt aware of current medical situation.        Exercises      General Comments        Pertinent Vitals/Pain Pain Assessment: Faces Faces Pain Scale: Hurts even more Pain Location: R hip Pain Descriptors / Indicators: Sore;Discomfort;Moaning;Grimacing;Guarding;Aching Pain Intervention(s): Monitored during session;Repositioned    Home Living  Prior Function            PT Goals (current goals can now be found in the care plan section) Progress towards PT goals: Progressing toward goals    Frequency    Min 3X/week      PT Plan Current plan remains appropriate    Co-evaluation              AM-PAC PT "6 Clicks" Mobility   Outcome Measure  Help needed turning from your back to your side while in a flat bed without using bedrails?: A Lot Help needed moving from lying on your back to sitting on the side of a flat bed without using bedrails?: A Lot Help needed moving to and from a bed to a chair (including a wheelchair)?: Total Help needed standing up from a chair using your arms (e.g., wheelchair or bedside chair)?: Total Help needed to walk in hospital room?: Total Help needed  climbing 3-5 steps with a railing? : Total 6 Click Score: 8    End of Session Equipment Utilized During Treatment: Gait belt Activity Tolerance: Patient limited by pain Patient left: with call bell/phone within reach Nurse Communication: Mobility status PT Visit Diagnosis: Pain;Other abnormalities of gait and mobility (R26.89) Pain - Right/Left: Right Pain - part of body: Hip     Time: 3662-9476 PT Time Calculation (min) (ACUTE ONLY): 25 min  Charges:  $Gait Training: 8-22 mins $Therapeutic Activity: 8-22 mins                     Rica Koyanagi  PTA Acute  Rehabilitation Services Pager      [phone] Office      [phone]

## 2019-01-01 ENCOUNTER — Telehealth: Payer: Self-pay | Admitting: Family Medicine

## 2019-01-01 LAB — URINE CULTURE: Culture: <10000 — AB

## 2019-01-01 NOTE — Telephone Encounter (Signed)
Carol Massey forgot to tell nurse that they have placed her back on baby aspirin 81 mg for 4 weeks twice a day.

## 2019-01-01 NOTE — Telephone Encounter (Signed)
Discussed with pt's son [name]. He states home health nurse is coming out this morning and he will call to set up her ortho appt today. She was to follow up in 2 weeks after being discharged.  New meds are oxycontin 5mg  one every 6 hours instead of hydrocodone and methocarbamol 500mg  every 6 hours. [name] states he will call if she needs anything and wanted to thank dr Nicki Reaper for everything he has done for them.

## 2019-01-01 NOTE — Telephone Encounter (Signed)
I was glad to see that her surgery in her hospitalization went well Make sure with my that he does have home health consulted Often this is already accomplished by the hospital upon discharge Also if he should have any other needs for [facility] to let us know Also when will his follow-up be with orthopedics? I can help do a home visit in the near future

## 2019-01-08 ENCOUNTER — Telehealth: Payer: Self-pay | Admitting: Family Medicine

## 2019-01-08 MED ORDER — OXYCODONE HCL 5 MG PO TABS: ORAL_TABLET | ORAL | 0 refills | Status: DC

## 2019-01-08 NOTE — Telephone Encounter (Signed)
This is not best practice jumpoing back and forth from probvider to provider but will honor this one time numb 15 one up to five times per d pern pain

## 2019-01-08 NOTE — Telephone Encounter (Signed)
Patient got oxycodone 5mg  #56 form specialist 12/30/18 for hip fracture

## 2019-01-08 NOTE — Telephone Encounter (Signed)
Pt's son [name] is calling to see if a refill on oxyCODONE (OXY IR/ROXICODONE) 5 MG immediate release tablet could be sent to [place], [place]. He tried calling the office who prescribed it but they stated it would be Monday before it got sent in and she doesn't have enough to make it through the weekend. He states she will need 15 to make it until Monday.   Also if that is unable to be called in she does have some hydrocodone 5 - 325 and would like to know how much of that she should take in place of the oxycodone.

## 2019-01-08 NOTE — Telephone Encounter (Signed)
Medication pended.

## 2019-01-11 ENCOUNTER — Other Ambulatory Visit: Payer: Self-pay | Admitting: Family Medicine

## 2019-01-11 ENCOUNTER — Telehealth: Payer: Self-pay | Admitting: Family Medicine

## 2019-01-11 MED ORDER — OXYCODONE HCL 5 MG PO TABS: ORAL_TABLET | ORAL | 0 refills | Status: DC

## 2019-01-11 NOTE — Telephone Encounter (Signed)
Please call the son Find out how often she is having use of pain medicine please 1 every? How many per day?

## 2019-01-11 NOTE — Telephone Encounter (Signed)
At least 6 per day and sometimes 8 per day of the 5mg . She takes two of the 5mg  3 -4 times a day

## 2019-01-11 NOTE — Telephone Encounter (Signed)
Pt son contacted and verbalized understanding. Pt will call back in the morning to set up appt for mother. Pt would like Dr.Scott to take over pain med. Pt has appt at ortho this week; son has tried to get in touch with Ortho but has not heard anything.

## 2019-01-11 NOTE — Telephone Encounter (Signed)
I did send in a prescription for 50 tablets this should last for over the next few days  The patient son will need to connect with orthopedics. He will need to decide regarding the pain medicine which provider will be coming from If he wants [place] to really take this over we will need to set up a follow-up virtual visit later this week

## 2019-01-11 NOTE — Telephone Encounter (Signed)
They've not heard back from ortho & pt's pain is bad  Can we refill Oxycodone?   [facility]  Please advise and call pt

## 2019-01-11 NOTE — Telephone Encounter (Signed)
Every 6 hours

## 2019-01-14 ENCOUNTER — Telehealth: Payer: Self-pay | Admitting: Family Medicine

## 2019-01-14 ENCOUNTER — Ambulatory Visit (INDEPENDENT_AMBULATORY_CARE_PROVIDER_SITE_OTHER): Payer: Medicare Other | Admitting: Family Medicine

## 2019-01-14 ENCOUNTER — Other Ambulatory Visit: Payer: Self-pay

## 2019-01-14 DIAGNOSIS — M25551 Pain in right hip: Secondary | ICD-10-CM | POA: Diagnosis not present

## 2019-01-14 MED ORDER — OXYCODONE HCL 5 MG PO TABS: ORAL_TABLET | ORAL | 0 refills | Status: DC

## 2019-01-14 NOTE — Telephone Encounter (Signed)
Home Health ([name]) calling to  Get verbal orders to continue care in the home 2 days a week for the next two weeks. [phone]

## 2019-01-14 NOTE — Telephone Encounter (Signed)
May have verbal orders to do so

## 2019-01-14 NOTE — Telephone Encounter (Signed)
Verbal orders left on amy's voicemail

## 2019-01-14 NOTE — Progress Notes (Signed)
   Subjective:    Patient ID: Carol Massey, female    DOB: 1925/10/23, 85 y.o.   MRN: [id number] Telephone visit only video not available Patient sustained a recent fracture of the hip is now being cared for by her son at home they are trying to do the best they can to avoid offices to avoid coronavirus She is getting therapy at home and able to do some mild weightbearing No orthopedic specialist has her on pain medicines oxycodone taking 2 tablets 3 times a day and also muscle relaxers on a regular basis her bowel movements are fine her mentation is fine HPI  Patients son(DPR) called to discuss Dr Nicki Reaper taking over pain management.  Son stated the orthopedic had her on Oxycodone 5 mg 2 tabs TID Son states she has been released to 50% weight bearing and has been transferring well. Son states he is seeing improvement everyday.  Virtual Visit via Video Note  I connected with Carol Massey on 01/14/19 at  2:30 PM EDT by a video enabled telemedicine application and verified that I am speaking with the correct person using two identifiers.  Location: Patient: home Provider: office   I discussed the limitations of evaluation and management by telemedicine and the availability of in person appointments. The patient expressed understanding and agreed to proceed.  History of Present Illness:    Observations/Objective:   Assessment and Plan:   Follow Up Instructions:    I discussed the assessment and treatment plan with the patient. The patient was provided an opportunity to ask questions and all were answered. The patient agreed with the plan and demonstrated an understanding of the instructions.   The patient was advised to call back or seek an in-person evaluation if the symptoms worsen or if the condition fails to improve as anticipated.  I provided 15 minutes of non-face-to-face time during this encounter.      Review of Systems  Constitutional: Negative for activity change and  appetite change.  HENT: Negative for congestion and rhinorrhea.   Respiratory: Negative for cough and shortness of breath.   Cardiovascular: Negative for chest pain and leg swelling.  Gastrointestinal: Negative for abdominal pain, nausea and vomiting.  Musculoskeletal: Positive for gait problem.  Skin: Negative for color change.  Neurological: Negative for dizziness and weakness.  Psychiatric/Behavioral: Negative for agitation and confusion.       Objective:   Physical Exam  Today's visit was via telephone Physical exam was not possible for this visit       Assessment & Plan:  Fractured hip I believe it is time for the patient to taper off of the muscle relaxer the son will drop 1/2 tablet of the muscle relaxer every 3 to 4 days until next week she will not be on muscle relaxer  As per pain management I encouraged the son to start gradually tapering down on the pain medicine and hopefully over the course of the next 7 to 10 days get it down to 1 tablet 3 times daily as needed for pain and potentially at some point get her totally off of this but this is governed by the amount of pain she is having so far she is not having any major setbacks.

## 2019-01-19 ENCOUNTER — Telehealth: Payer: Self-pay | Admitting: Family Medicine

## 2019-01-19 NOTE — Telephone Encounter (Signed)
Pt's son Carol Massey calling in to give an update. Carol Massey seems to be improving daily, she is transferring from wheelchair to chair well. She is able to walk to bathroom from chair with walker which is about 22 steps. She is still not fully weight baring on her right hip.   She has been taking a muscle relaxer 1/2 tab in the am 1/2 tab at lunch and a whole one at night. He plans to cut her to a 1/2 a tab at night starting tomorrow night.   She is taking her oxycodone full amount still he plans to start decreasing that tomorrow morning from 2 tablets to 1 1/2 tablets.   She has a follow up scheduled for June 2nd as a virtual visit.

## 2019-01-19 NOTE — Telephone Encounter (Signed)
Okay so noted I think it is reasonable to go ahead and start tapering the oxycodone We can do virtual visit next week as planned thank you

## 2019-01-19 NOTE — Telephone Encounter (Signed)
Son (DPR) notified per Dr Nicki Reaper:  so noted-Dr Nicki Reaper thinks it is reasonable to go ahead and start tapering the oxycodone We can do virtual visit next week as planned. Som verbalized understanding and stated he will begin tapering the oxycodone tomorrow.

## 2019-01-19 NOTE — Telephone Encounter (Signed)
Pt's son [name] calling in to give an update. Carol Massey seems to be improving daily, she is transferring from wheelchair to chair well. She is able to walk to bathroom from chair with walker which is about 22 steps. She is still not fully weight baring on her right hip.   She has been taking a muscle relaxer 1/2 tab in the am 1/2 tab at lunch and a whole one at night. He plans to cut her to a 1/2 a tab at night starting tomorrow night.   She is taking her oxycodone full amount still he plans to start decreasing that tomorrow morning from 2 tablets to 1 1/2 tablets.   She has a follow up scheduled for June 2nd as a virtual visit.

## 2019-01-26 ENCOUNTER — Ambulatory Visit (INDEPENDENT_AMBULATORY_CARE_PROVIDER_SITE_OTHER): Payer: Medicare Other | Admitting: Family Medicine

## 2019-01-26 ENCOUNTER — Other Ambulatory Visit: Payer: Self-pay

## 2019-01-26 VITALS — BP 110/44 | HR 64

## 2019-01-26 DIAGNOSIS — M25551 Pain in right hip: Secondary | ICD-10-CM

## 2019-01-26 DIAGNOSIS — D509 Iron deficiency anemia, unspecified: Secondary | ICD-10-CM

## 2019-01-26 DIAGNOSIS — N289 Disorder of kidney and ureter, unspecified: Secondary | ICD-10-CM

## 2019-01-26 MED ORDER — OXYCODONE HCL 5 MG PO TABS: ORAL_TABLET | ORAL | 0 refills | Status: DC

## 2019-01-26 NOTE — Progress Notes (Signed)
   Subjective:    Patient ID: Carol Massey, female    DOB: 1925-10-19, 85 y.o.   MRN: [id number]  HPImed check up. Son states she is doing very well. BP 112/58 yesterday when PT took. Son took vitals this morning and bp was 110/44, pulse 64, and oxygen 93% Significant hip pain discomfort it is slowly healing it will take some time no other particular setbacks.  Using pain medicine and also muscle relaxers but son is tapering off the muscle relaxers Son [name] wants to know if pt should be taking aspirin 81mg  one bid. Started after surgery. Came home May 8th.   Taking one iron tablet daily. Son had concerns about if she should continue.   Virtual Visit via Telephone Note  I connected with Carol Massey on 01/26/19 at 10:00 AM EDT by telephone and verified that I am speaking with the correct person using two identifiers.  Location: Patient: home Provider: office   I discussed the limitations, risks, security and privacy concerns of performing an evaluation and management service by telephone and the availability of in person appointments. I also discussed with the patient that there may be a patient responsible charge related to this service. The patient expressed understanding and agreed to proceed.   History of Present Illness:    Observations/Objective:   Assessment and Plan:   Follow Up Instructions:    I discussed the assessment and treatment plan with the patient. The patient was provided an opportunity to ask questions and all were answered. The patient agreed with the plan and demonstrated an understanding of the instructions.   The patient was advised to call back or seek an in-person evaluation if the symptoms worsen or if the condition fails to improve as anticipated.  I provided 15 minutes of non-face-to-face time during this encounter.           Review of Systems  Constitutional: Negative for activity change, fatigue and fever.  HENT: Negative for congestion and  rhinorrhea.   Respiratory: Negative for cough, chest tightness and shortness of breath.   Cardiovascular: Negative for chest pain and leg swelling.  Gastrointestinal: Negative for abdominal pain and nausea.  Skin: Negative for color change.  Neurological: Negative for dizziness and headaches.  Psychiatric/Behavioral: Negative for agitation and behavioral problems.       Objective:   Physical Exam  Today's visit was via telephone Physical exam was not possible for this visit   15 minutes was spent with patient today discussing healthcare issues which they came.  More than 50% of this visit-total duration of visit-was spent in counseling and coordination of care.  Please see diagnosis regarding the focus of this coordination and care     Assessment & Plan:  Left hip pain discomfort Tapering down on pain medicine I encouraged to taper off of muscle relaxers New pain prescription sent in  Patient has history of renal insufficiency anemia needs lab work we will do this through home health  We will follow-up patient in approximately 4 to 6 weeks time sooner problems

## 2019-01-27 ENCOUNTER — Telehealth: Payer: Self-pay | Admitting: *Deleted

## 2019-01-27 NOTE — Telephone Encounter (Signed)
Pt seen yesterday and dr Nicki Reaper wanted met 7 and cbc ordered through [facility]. Called and gave orders for met 7 and cbc to [facility] at [facility] and she states they will do bw tomorrow.

## 2019-01-27 NOTE — Telephone Encounter (Signed)
Okay so noted 

## 2019-01-28 ENCOUNTER — Encounter: Payer: Self-pay | Admitting: Family Medicine

## 2019-02-01 ENCOUNTER — Telehealth: Payer: Self-pay | Admitting: Family Medicine

## 2019-02-01 NOTE — Telephone Encounter (Signed)
Please advise. Thank you

## 2019-02-01 NOTE — Telephone Encounter (Signed)
Verbal order given to [facility] at [facility]

## 2019-02-01 NOTE — Telephone Encounter (Signed)
sure

## 2019-02-01 NOTE — Telephone Encounter (Signed)
Son has called them and wants them to come out and check a pressure ulcer she has on her heel.  They need a verbal order.  [facility] [phone] ext [phone]

## 2019-02-02 ENCOUNTER — Telehealth: Payer: Self-pay | Admitting: Family Medicine

## 2019-02-02 NOTE — Telephone Encounter (Signed)
[name] is calling giving an update on Mrs. Carol Massey. He is aware Dr. Nicki Reaper is out of the office.   She is doing ok she does have a pressure sore on her foot that is a little bigger than a silver dollar. The nurse is watching it and cleaning she has also ordered a boot for her to wear. They are keeping it elevated. The nurse thinks it will take 4-5 weeks to heal.   She is taking 1/2 muscle relaxer morning noon and night and her pain medication she is taking a whole tablet morning noon and night 2 hours after taking muscle relaxer. He tried to cut her pain medication back but she was complaining of being in pain. So he didn't want to chance that. He will attempt to cut her back again. She has a phone visit with Dr. Nicki Reaper on Monday set up.

## 2019-02-02 NOTE — Telephone Encounter (Signed)
FYI

## 2019-02-02 NOTE — Telephone Encounter (Signed)
ok 

## 2019-02-02 NOTE — Telephone Encounter (Signed)
Please advise. Thank you

## 2019-02-02 NOTE — Telephone Encounter (Signed)
Carol Massey w/[facility] called to get verbal OK  Pt has a wound on her right heel  Need OK for 2x week for 1 week & 1x week for 3 weeks  Wound care recommendations betadine, non-adherent dressing, foam dressing, wrapped with gauze daily & elevate heel  Please advise & call Carol Massey [phone]

## 2019-02-02 NOTE — Telephone Encounter (Signed)
Contacted Clarise Cruz and gave verbal OK for wound care 2x week for 1 week and 1x week for 3 weeks. Clarise Cruz verbalized understanding

## 2019-02-05 NOTE — Telephone Encounter (Signed)
You may call the family on Monday  Let them know that I did receive the update. Hopefully pressure sore will heal over the near future. I did review over the lab work that was presented.  Glucose slightly elevated but not in a worrisome level at 124 Hemoglobin 10.6 Please have home health draw H85 level and folic acid level because of anemia macrocytic

## 2019-02-08 ENCOUNTER — Other Ambulatory Visit: Payer: Self-pay

## 2019-02-08 ENCOUNTER — Ambulatory Visit (INDEPENDENT_AMBULATORY_CARE_PROVIDER_SITE_OTHER): Payer: Medicare Other | Admitting: Family Medicine

## 2019-02-08 DIAGNOSIS — M25559 Pain in unspecified hip: Secondary | ICD-10-CM | POA: Diagnosis not present

## 2019-02-08 NOTE — Progress Notes (Signed)
   Subjective:  Telephone only  Patient ID: Carol Massey, female    DOB: [birth year], 85 y.o.   MRN: [id number] Phone visit only HPI This patient was seen today for chronic pain  The medication list was reviewed and updated.   -Compliance with medication: Oxycodone 5 mg 1/2 tab in a.m; 1 tab 6 hours later; 1 tab at bedtime Son is giving patient Robaxin 1/2 tab twice a day.   - Number patient states they take daily:2.5  -when was the last dose patient took? This morning  The patient was advised the importance of maintaining medication and not using illegal substances with these.  Here for refills and follow up  The patient was educated that we can provide 3 monthly scripts for their medication, it is their responsibility to follow the instructions.  Side effects or complications from medications: none  Patient is aware that pain medications are meant to minimize the severity of the pain to allow their pain levels to improve to allow for better function. They are aware of that pain medications cannot totally remove their pain.  Due for UDT ( at least once per year) :   Pt has pressure sore on right heel.Home health([facility])  is coming out once a week to change dressing.  Virtual Visit via Video Note  I connected with Abran Duke on 02/08/19 at 11:00 AM EDT by a video enabled telemedicine application and verified that I am speaking with the correct person using two identifiers.  Location: Patient: home Provider: office   I discussed the limitations of evaluation and management by telemedicine and the availability of in person appointments. The patient expressed understanding and agreed to proceed.  History of Present Illness:    Observations/Objective:   Assessment and Plan:   Follow Up Instructions:    I discussed the assessment and treatment plan with the patient. The patient was provided an opportunity to ask questions and all were answered. The patient  agreed with the plan and demonstrated an understanding of the instructions.   The patient was advised to call back or seek an in-person evaluation if the symptoms worsen or if the condition fails to improve as anticipated.  I provided 20 minutes of non-face-to-face time during this encounter.   Vicente Males, LPN      Review of Systems  Constitutional: Negative for activity change and appetite change.  HENT: Negative for congestion and rhinorrhea.   Respiratory: Negative for cough and shortness of breath.   Cardiovascular: Negative for chest pain and leg swelling.  Gastrointestinal: Negative for abdominal pain, nausea and vomiting.  Skin: Negative for color change.  Neurological: Negative for dizziness and weakness.  Psychiatric/Behavioral: Negative for agitation and confusion.       Objective:   Physical Exam  Today's visit was via telephone Physical exam was not possible for this visit   Patient has a heel sore that is gradually healing we did discuss ways to try to improve that home health is also working with them    Assessment & Plan:  Based on all this I believe she is getting better she needs to stay away from the muscle relaxers I discussed this with the son  In addition to this also recommend cutting back on pain medicine half tablet in the morning and 1 tablet during the day and 1/2 tablet in the evening we will recheck in on the patient in 3 to 4 weeks

## 2019-02-08 NOTE — Telephone Encounter (Signed)
Called and discussed with pt's son [name] and orders given to [facility]. Await results.

## 2019-02-09 ENCOUNTER — Other Ambulatory Visit (HOSPITAL_COMMUNITY): Payer: Medicare Other

## 2019-02-15 ENCOUNTER — Other Ambulatory Visit (HOSPITAL_COMMUNITY): Payer: Self-pay | Admitting: Nurse Practitioner

## 2019-02-16 ENCOUNTER — Inpatient Hospital Stay (HOSPITAL_COMMUNITY): Payer: Medicare Other | Attending: Hematology | Admitting: Nurse Practitioner

## 2019-02-16 ENCOUNTER — Other Ambulatory Visit: Payer: Self-pay

## 2019-02-16 DIAGNOSIS — D508 Other iron deficiency anemias: Secondary | ICD-10-CM | POA: Diagnosis not present

## 2019-02-16 DIAGNOSIS — Z79899 Other long term (current) drug therapy: Secondary | ICD-10-CM | POA: Diagnosis not present

## 2019-02-16 DIAGNOSIS — D500 Iron deficiency anemia secondary to blood loss (chronic): Secondary | ICD-10-CM

## 2019-02-16 NOTE — Assessment & Plan Note (Addendum)
1.  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 - Last Feraheme infusion was on 10/28/2018 and 11/05/2018. -She occasionally sees dark stools.  However denies any bright red bleeding per rectum. -Labs on 02/10/2019 showed her hemoglobin 12.2, ferritin 277, vitamin B12 over 2000. -Son reports she started on oral iron supplementation which she is taking daily.  Denies any constipation. -She fell and broke her hip in May and had surgery Dec 28, 2018.  She is at home recovering. -I do not recommend any IV Feraheme at this time. -She will follow-up in 4 months with repeat labs.

## 2019-02-16 NOTE — Progress Notes (Signed)
Virtual Visit via Telephone Note  I connected with Carol Massey's son on 02/16/19 at 51 AM EDT by telephone and verified that I am speaking with the correct person using two identifiers.   I discussed the limitations, risks, security and privacy concerns of performing an evaluation and management service by telephone and the availability of in person appointments. I also discussed with the patient's son that there may be a patient responsible charge related to this service. The patient's son expressed understanding and agreed to proceed.   History of Present Illness: Patient presents with a history of iron deficiency anemia due to chronic blood loss.  She reports she has been feeling better since getting the 2 iron infusions and having more energy during the day.  She denies any bright red bleeding. Denies any nausea, vomiting, or diarrhea. Denies any new pains. Had not noticed any recent bleeding such as epistaxis, hematuria or hematochezia. Denies recent chest pain on exertion, shortness of breath on minimal exertion, pre-syncopal episodes, or palpitations. Denies any numbness or tingling in hands or feet. Denies any recent fevers, infections, or recent hospitalizations. Patient reports appetite at 75% and energy level at 50%.  She is eating well and maintaining her weight at this time.    Observations/Objective: -Spoke to patient's son due to patient being hard of hearing and could not hear over the phone.  Son reported that she was alert and oriented and in no acute distress - Physical assessment deferred due to telephone visit.  Assessment and Plan: 1.  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 - Last Feraheme infusion was on 10/28/2018 and 11/05/2018. -She occasionally sees dark stools.  However denies any bright red bleeding per rectum. -Labs on 02/10/2019 showed her hemoglobin 12.2, ferritin 277, vitamin B12 over 2000. -Son reports  she started on oral iron supplementation which she is taking daily.  Denies any constipation. -She fell and broke her hip in May and had surgery Dec 28, 2018.  She is at home recovering. -I do not recommend any IV Feraheme at this time. -She will follow-up in 4 months with repeat labs.     Follow Up Instructions: -Follow-up in 4 months with repeat labs   I discussed the assessment and treatment plan with the patient. The patient was provided an opportunity to ask questions and all were answered. The patient agreed with the plan and demonstrated an understanding of the instructions.   The patient was advised to call back or seek an in-person evaluation if the symptoms worsen or if the condition fails to improve as anticipated.  I provided 20 minutes of non-face-to-face time during this encounter. Patient does not have access to video chat.  Glennie Isle, NP-C

## 2019-02-17 ENCOUNTER — Telehealth: Payer: Self-pay | Admitting: Family Medicine

## 2019-02-17 NOTE — Telephone Encounter (Signed)
Please continue home health therapy as requested

## 2019-02-17 NOTE — Telephone Encounter (Signed)
So noted 

## 2019-02-17 NOTE — Telephone Encounter (Signed)
Son wants to give you an update on patient's hip bone. He states its not healing well and she is going back to specialist in six weeks. He has cut her down the to 2 half pain pill at night. She is taking oxycodone.

## 2019-02-17 NOTE — Telephone Encounter (Addendum)
Verbal order given to [name] at home health

## 2019-02-17 NOTE — Telephone Encounter (Signed)
[name] called requesting verbal orders to continue physical therapy and home health aid. Contact [phone]

## 2019-02-24 ENCOUNTER — Telehealth: Payer: Self-pay | Admitting: Family Medicine

## 2019-02-24 NOTE — Telephone Encounter (Signed)
Please give verbal orders for this.   

## 2019-02-24 NOTE — Telephone Encounter (Signed)
Please advise. Thank you

## 2019-02-24 NOTE — Telephone Encounter (Signed)
Home Health [facility]) needing verbal orders for patient to receive nursing care for 2 weeks for 2 days and 1 week for 1 day for treating a sore on right heel and 2 weeks for 2 days for home health aid. [phone]

## 2019-02-25 NOTE — Telephone Encounter (Signed)
Contacted Carol Massey and gave verbal orders. Verdis Frederickson verbalized understanding

## 2019-02-27 ENCOUNTER — Other Ambulatory Visit: Payer: Self-pay | Admitting: Family Medicine

## 2019-03-01 ENCOUNTER — Telehealth: Payer: Self-pay | Admitting: Family Medicine

## 2019-03-01 NOTE — Telephone Encounter (Signed)
Carol Massey is wanting to give you update on patient heel its better but it will take about 3 more weeks for it to be completely healed. She has cut down on oxycodone and half at night and is seeing surgeon in August about her hip.

## 2019-03-05 ENCOUNTER — Telehealth: Payer: Self-pay | Admitting: Family Medicine

## 2019-03-05 NOTE — Telephone Encounter (Signed)
That is quite a long one day trip for  85 y o,. partic on as many meds as she is on, so sorry I cannot say whether or not this would be safe with the rcent heat i'm inclined to say no they can ask dr Nicki Reaper the same question in tend days

## 2019-03-05 NOTE — Telephone Encounter (Signed)
Contacted pt son and informed son that per Dr.Steve That is quite a long one day trip for  85 y o,. partic on as many meds as she is on, so sorry I cannot say whether or not this would be safe with the rcent heat i'm inclined to say no they can ask dr Nicki Reaper the same question in tend days Pt son verbalized understanding

## 2019-03-05 NOTE — Telephone Encounter (Signed)
Pt's son would like to know if it is safe to take her to the beach for a day. He is thinking of driving to [place] leaving around 7 am getting there and just letting her sit in the car and look at the ocean eat a packed lunch and drive home. She hasnt been to beach in 10 years and would like to see the ocean. He wants to know if we think she is strong enough to make that trip.   CB# [phone]

## 2019-03-05 NOTE — Telephone Encounter (Signed)
Please advise. Thank you

## 2019-03-08 ENCOUNTER — Other Ambulatory Visit: Payer: Self-pay | Admitting: Family Medicine

## 2019-03-15 ENCOUNTER — Other Ambulatory Visit: Payer: Self-pay

## 2019-03-15 ENCOUNTER — Telehealth: Payer: Self-pay | Admitting: Family Medicine

## 2019-03-15 ENCOUNTER — Ambulatory Visit (INDEPENDENT_AMBULATORY_CARE_PROVIDER_SITE_OTHER): Payer: Medicare Other | Admitting: Family Medicine

## 2019-03-15 DIAGNOSIS — A084 Viral intestinal infection, unspecified: Secondary | ICD-10-CM

## 2019-03-15 NOTE — Progress Notes (Signed)
I connected with  Carol Massey on 03/15/19 by a video enabled telemedicine application and verified that I am speaking with the correct person using two identifiers.   I discussed the limitations of evaluation and management by telemedicine. The patient expressed understanding and agreed to proceed.  Telephone visit Video not possible Spoke with patient and son Patient is having significant nausea and vomiting.  Although this is settled down today it was present yesterday along with some diarrhea No cough wheezing or difficulty breathing Having some minimal shortness of breath which is consistent with her ongoing COPD  Patient has a history of frailty, COPD, recent hip surgery.  Currently patient relates some nausea but not intense no vomiting currently no diarrhea currently no cough wheezing or difficulty breathing Minimal shortness of breath consistent with her COPD no fevers  Today's visit was via telephone Physical exam was not possible for this visit  Patient has underlying COPD issues stable currently I doubt COVID but because a gastrin intestinal symptoms as well as being around a family member who was not wearing a mask I would recommend COVID testing Warning signs regarding COVID was discussed in detail Await the results of all of this  The son also had questions about whether or not the mother would tolerate a trip all the way to the beach I recommend to put that off during this hot weather viral gastroenteritis

## 2019-03-15 NOTE — Telephone Encounter (Signed)
Son wanted to confirm with Dr. Nicki Reaper - called last week & Dr. Richardson Landry was not in favor of pt taking a day trip to see the ocean due to age & medications, son wants Dr. Bary Leriche opinion - please advise   Also pt had a good episode of vomiting & stomach cramps today, no other symptoms, has SOB due to her COPD (nothing more that her normal) - could be something she ate yesterday - daughter brought over some food that normally don't agree to well with pt  Son wonders if he should take pt to be tested for Covid?  Please advise - call pt

## 2019-03-15 NOTE — Telephone Encounter (Signed)
Please advise. Thank you

## 2019-03-15 NOTE — Telephone Encounter (Signed)
Pt added to schedule; tried to contact sone. Son does not have voicemail set up

## 2019-03-15 NOTE — Telephone Encounter (Signed)
Please add this as a virtual phone visit this afternoon If okay with the son I will work the phone call into the day Already I am full in regards to appointments for today so I will work them in as best as possible

## 2019-03-16 ENCOUNTER — Other Ambulatory Visit: Payer: Medicare Other

## 2019-03-16 ENCOUNTER — Other Ambulatory Visit: Payer: Self-pay

## 2019-03-16 DIAGNOSIS — Z20822 Contact with and (suspected) exposure to covid-19: Secondary | ICD-10-CM

## 2019-03-16 NOTE — Telephone Encounter (Signed)
Patient had virtual visit with Dr Nicki Reaper yesterday 03/15/2019

## 2019-03-18 LAB — NOVEL CORONAVIRUS, NAA: SARS-CoV-2, NAA: NOT DETECTED

## 2019-03-19 ENCOUNTER — Telehealth: Payer: Self-pay | Admitting: Family Medicine

## 2019-03-19 NOTE — Telephone Encounter (Signed)
Pt son verbalized understanding.

## 2019-03-19 NOTE — Telephone Encounter (Signed)
Pt's son calling checking on covid test results. Also he gave pt a imodium this afternoon due to her bowel movements

## 2019-03-19 NOTE — Telephone Encounter (Signed)
COVID test is negative Please find out from the son how her symptoms are Fevers?  Coughing?  Diarrhea if so watery?  Mucus or blood?  Frequency?

## 2019-03-19 NOTE — Telephone Encounter (Signed)
Contacted pt son. Pt son states that patient has no fever, cough is due to COPD, diarrhea is not watery, no mucus or blood, pt is going about 2-3 times a day. Monday she went 3-4 times, Tuesday twice, Wednesday once and today three times. Son has been checking temperatures every day.

## 2019-03-19 NOTE — Telephone Encounter (Signed)
Please advise thank you

## 2019-03-19 NOTE — Telephone Encounter (Signed)
This sounds more likely a intestinal virus not COVID may use Imodium when necessary call us if any ongoing troubles

## 2019-03-25 ENCOUNTER — Telehealth: Payer: Self-pay | Admitting: Family Medicine

## 2019-03-25 NOTE — Telephone Encounter (Signed)
It is perfectly fine to go ahead and grant this

## 2019-03-25 NOTE — Telephone Encounter (Signed)
Verbal orders given to sarah at home health.

## 2019-03-25 NOTE — Telephone Encounter (Signed)
[facility], ONCE A WEEK FOR 4 WEEK  ALSO FOR THE AIDE TO COME OUT TWICE A WEEK 4 WEEKS FOR  WOUND ON HEEL   [phone]  [facility]

## 2019-03-31 ENCOUNTER — Telehealth: Payer: Self-pay | Admitting: Family Medicine

## 2019-03-31 NOTE — Telephone Encounter (Signed)
Verbal order given to [name] at [facility] 

## 2019-03-31 NOTE — Telephone Encounter (Signed)
Please go ahead with verbal order

## 2019-03-31 NOTE — Telephone Encounter (Signed)
[place] called needing a verebal order for home health twice a week for 4 weeks due to hip fx.  [name]- [phone]

## 2019-04-12 ENCOUNTER — Encounter (HOSPITAL_COMMUNITY): Payer: Medicare Other

## 2019-04-16 ENCOUNTER — Encounter: Payer: Self-pay | Admitting: Family Medicine

## 2019-04-16 ENCOUNTER — Ambulatory Visit (INDEPENDENT_AMBULATORY_CARE_PROVIDER_SITE_OTHER): Payer: Medicare Other | Admitting: Family Medicine

## 2019-04-16 ENCOUNTER — Other Ambulatory Visit: Payer: Self-pay

## 2019-04-16 VITALS — BP 112/58

## 2019-04-16 DIAGNOSIS — R197 Diarrhea, unspecified: Secondary | ICD-10-CM

## 2019-04-16 DIAGNOSIS — R1110 Vomiting, unspecified: Secondary | ICD-10-CM

## 2019-04-16 MED ORDER — ONDANSETRON 4 MG PO TBDP: 4.0000 mg | ORAL_TABLET | Freq: Three times a day (TID) | ORAL | 0 refills | Status: DC | PRN

## 2019-04-16 NOTE — Progress Notes (Signed)
   Subjective:  Audio only  Patient's son calls for a very protracted discussion regarding her concerns  Patient ID: Carol Massey, female    DOB: 07/21/1926, 85 y.o.   MRN: [id number]  HPI  Patient's son(DPR) calls to discuss ongoing issues with vomiting and diarrhea. Patient has had this issue 3 times in less than a month. The vomiting is clear and she doesn't seem to feel bad.  Virtual Visit via Video Note  I connected with Carol Massey on 04/16/19 at  3:00 PM EDT by a video enabled telemedicine application and verified that I am speaking with the correct person using two identifiers.  Location: Patient: home Provider: office   I discussed the limitations of evaluation and management by telemedicine and the availability of in person appointments. The patient expressed understanding and agreed to proceed.  History of Present Illness:    Observations/Objective:   Assessment and Plan:   Follow Up Instructions:    I discussed the assessment and treatment plan with the patient. The patient was provided an opportunity to ask questions and all were answered. The patient agreed with the plan and demonstrated an understanding of the instructions.   The patient was advised to call back or seek an in-person evaluation if the symptoms worsen or if the condition fails to improve as anticipated.  I provided 28 minutes of non-face-to-face time during this encounter.  Patient's having difficulties.  Intermittent loose stools.  This has occurred since a subacute hospitalization a number of weeks ago.  Patient also experiencing intermittent vomiting spells.  Seems to be mostly clear fluid.  Family uncertain whether truly choking on food and drink or not.  They are very concerned.  Patient has an element of dementia it sounds like, so therefore a bit of a challenge historically.  Family concerned that this vomiting continues to occur.  No major history of reflux in the past prior to recent  sickness  Intermittent loose stools with somewhat diminished appetite.  No obvious fever or chills.  No history of C. difficile colitis   Review of Systems No fever no rash no chest pain    Objective:   Physical Exam   Virtual     Assessment & Plan:  Impression progressive gastrointestinal difficulties for this elderly fragile patient.  Frequent diarrhea since hospitalization raises concern for C. difficile.  Intermittent vomiting raises question of dysphasia.  Though curiously seems to be more associated with liquids.  Patient may also be suffering from presbyesophagus at this point.  Will add Zofran as needed.  GI referral initiated.  Rationale discussed questions answered  Greater than 50% of this 25 minute face to face visit was spent in counseling and discussion and coordination of care regarding the above diagnosis/diagnosies

## 2019-04-18 ENCOUNTER — Encounter: Payer: Self-pay | Admitting: Family Medicine

## 2019-04-19 ENCOUNTER — Encounter: Payer: Self-pay | Admitting: Family Medicine

## 2019-04-21 ENCOUNTER — Encounter: Payer: Self-pay | Admitting: Internal Medicine

## 2019-04-24 LAB — CLOSTRIDIUM DIFFICILE BY PCR: Toxigenic C. Difficile by PCR: NEGATIVE

## 2019-04-28 ENCOUNTER — Other Ambulatory Visit: Payer: Self-pay | Admitting: Family Medicine

## 2019-04-28 ENCOUNTER — Telehealth: Payer: Self-pay | Admitting: Family Medicine

## 2019-04-28 NOTE — Telephone Encounter (Signed)
Son is calling because they are still having problems with constipation.  Has been giving her miralax every day with very little results.  Has only has one small bowel movement since last Friday.  Some left sided abdominal pain. Son is wondering if she could be getting a blockage. Home health nurse isn't due to come back until this Friday.

## 2019-04-28 NOTE — Telephone Encounter (Signed)
Last Friday pt went 3 different times; each time was small but solid. This morning pt had a BM and it was hard and in several pieces. Pt son states mom is having pain under the left breast area but states that when she has a BM the pain eases up. Son is alternating Sennokot S and Miralax. Once pt has BM son will give Sennokot and then the next day he will give Sennokot and Miralax. Pt has vomited some over the past week; clear liquid. Son states that the physical therapist came out and stated that pt may have a blockage and for son to contact PCP for further instructions. (Pt uses [facility]). Please advise. Thank you

## 2019-04-28 NOTE — Telephone Encounter (Signed)
I did discuss the case with the son He is coming tomorrow morning for an outside visit at 10:30 AM on my schedule

## 2019-04-29 ENCOUNTER — Telehealth: Payer: Self-pay | Admitting: Family Medicine

## 2019-04-29 ENCOUNTER — Ambulatory Visit (INDEPENDENT_AMBULATORY_CARE_PROVIDER_SITE_OTHER): Payer: Medicare Other | Admitting: Family Medicine

## 2019-04-29 ENCOUNTER — Other Ambulatory Visit: Payer: Self-pay

## 2019-04-29 DIAGNOSIS — K5900 Constipation, unspecified: Secondary | ICD-10-CM | POA: Diagnosis not present

## 2019-04-29 DIAGNOSIS — R4702 Dysphasia: Secondary | ICD-10-CM | POA: Diagnosis not present

## 2019-04-29 MED ORDER — PANTOPRAZOLE SODIUM 40 MG PO TBEC: DELAYED_RELEASE_TABLET | ORAL | 0 refills | Status: DC

## 2019-04-29 MED ORDER — SUCRALFATE 1 G PO TABS: 1.0000 g | ORAL_TABLET | Freq: Three times a day (TID) | ORAL | 1 refills | Status: DC | PRN

## 2019-04-29 MED ORDER — AMLODIPINE BESYLATE 2.5 MG PO TABS: 2.5000 mg | ORAL_TABLET | Freq: Every day | ORAL | 0 refills | Status: DC

## 2019-04-29 NOTE — Telephone Encounter (Signed)
Need refills for - please advise & call [name] when done  pantoprazole (PROTONIX) 40 MG tablet   amLODipine (NORVASC) 2.5 MG tablet    Please send to [facility] - please deliver

## 2019-04-29 NOTE — Telephone Encounter (Signed)
Refills sent in and pt son is aware.

## 2019-04-29 NOTE — Progress Notes (Signed)
Patient with intermittent epigastric pain and discomfort sometimes dysphasia had stretching of the esophagus 3 years ago food is not getting stuck currently is having moderate constipation issues but no blood in the stools using MiraLAX also using Senokot tablets try to get fiber into the diet no longer on opioids does not get around well since breaking her hip PMH benign ROS negative cough wheeze fever sweats chills vomiting diarrhea bloody stools positive for constipation and mild dysphasia  Lungs clear respiratory rate normal heart regular no murmurs extremities no edema skin warm dry patient does have a pressure ulcer on her heel abdomen soft no guarding rebound does have some slight subjective discomfort in the epigastric region  Soft diet is much as possible avoid big chunks of meat If ongoing troubles may need to have EGD and stretching Continue Protonix If progressive troubles or worse to notify us MiraLAX daily along with Senokot tablet Give [place] update within the next couple weeks Warning signs discussed I do not recommend x-rays or lab work currently.  We will send a copy of this to gastroenterology for their review

## 2019-04-29 NOTE — Progress Notes (Signed)
Thanks for the heads up! We'll see her in a couple weeks.

## 2019-04-30 DIAGNOSIS — L89610 Pressure ulcer of right heel, unstageable: Secondary | ICD-10-CM | POA: Diagnosis not present

## 2019-04-30 DIAGNOSIS — I10 Essential (primary) hypertension: Secondary | ICD-10-CM | POA: Diagnosis not present

## 2019-04-30 DIAGNOSIS — D638 Anemia in other chronic diseases classified elsewhere: Secondary | ICD-10-CM | POA: Diagnosis not present

## 2019-04-30 NOTE — Telephone Encounter (Signed)
Duplicate

## 2019-05-05 ENCOUNTER — Telehealth: Payer: Self-pay | Admitting: Family Medicine

## 2019-05-05 NOTE — Telephone Encounter (Signed)
Left message to return call-Carol Massey at [facility]

## 2019-05-05 NOTE — Telephone Encounter (Signed)
It would be fine to give verbal orders for that thank you

## 2019-05-05 NOTE — Telephone Encounter (Signed)
Carol Massey w/[facility] needs verbal orders to continue weekly wound care  Please advise & call Judson Roch - [phone]

## 2019-05-06 NOTE — Telephone Encounter (Signed)
Verbal orders given to Sarah at home health

## 2019-05-18 ENCOUNTER — Encounter: Payer: Self-pay | Admitting: Nurse Practitioner

## 2019-05-18 ENCOUNTER — Ambulatory Visit (INDEPENDENT_AMBULATORY_CARE_PROVIDER_SITE_OTHER): Payer: Medicare Other | Admitting: Nurse Practitioner

## 2019-05-18 ENCOUNTER — Encounter: Payer: Self-pay | Admitting: Internal Medicine

## 2019-05-18 ENCOUNTER — Ambulatory Visit (HOSPITAL_COMMUNITY)
Admission: RE | Admit: 2019-05-18 | Discharge: 2019-05-18 | Disposition: A | Discharge status: Home / Self Care | Payer: Medicare Other | Source: Ambulatory Visit | Attending: Nurse Practitioner | Admitting: Nurse Practitioner

## 2019-05-18 ENCOUNTER — Other Ambulatory Visit: Payer: Self-pay

## 2019-05-18 VITALS — BP 120/56 | HR 79 | Temp 96.2°F | Ht 59.0 in | Wt 105.0 lb

## 2019-05-18 DIAGNOSIS — R1030 Lower abdominal pain, unspecified: Secondary | ICD-10-CM

## 2019-05-18 DIAGNOSIS — R1111 Vomiting without nausea: Secondary | ICD-10-CM

## 2019-05-18 DIAGNOSIS — K5900 Constipation, unspecified: Secondary | ICD-10-CM | POA: Insufficient documentation

## 2019-05-18 DIAGNOSIS — R1110 Vomiting, unspecified: Secondary | ICD-10-CM | POA: Insufficient documentation

## 2019-05-18 DIAGNOSIS — R109 Unspecified abdominal pain: Secondary | ICD-10-CM | POA: Insufficient documentation

## 2019-05-18 DIAGNOSIS — R1310 Dysphagia, unspecified: Secondary | ICD-10-CM

## 2019-05-18 NOTE — Progress Notes (Signed)
dg 

## 2019-05-18 NOTE — Assessment & Plan Note (Signed)
The patient is having recurrent dysphasia intermittently with pills and solid foods.  Her son states that he cuts up her food into small bits.  However, he admits that she does eat too fast and this is typically what triggers a dysphagia episode.  I discussed with the patient and her son the need to eat slowly, will provide dietary recommendations for softer foods.  Can call the pharmacy and discuss pills that may be crushable or able to be emptied into applesauce or pudding.  I am a bit hesitant at her age and with her somewhat frail appearance about proceeding with an upper endoscopy with dilation.  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  If needed we can consider barium pill esophagram as an initial step.  Follow-up in 2 months.

## 2019-05-18 NOTE — Progress Notes (Signed)
Referring Provider: Mikey Kirschner, MD Primary Care Physician:  [name], MD Primary GI:  Dr. Gala Romney  Chief Complaint  Patient presents with   Emesis    usually occurs after no bm for several days, will occur on toilet during bm   Constipation    goes 3-4 days without bm; then will go several times a day and have large amt of diarrhea    HPI:   Carol Massey is a 85 y.o. female who presents on referral from primary care for recurrent vomiting.  Information provided with referral including office visit dated 04/16/2019 for diarrhea and recurrent vomiting.  This visit was a virtual office visit due to COVID-19/coronavirus pandemic.  At that time the patient was having intermittent loose stools since subacute hospitalization a number of weeks ago as well as intermittent vomiting spells.  When she vomits her emesis is mostly clear, family uncertain if she is truly vomiting or regurgitating food/fluids.  She has an element of dementia complicates her history.  No major history of reflux in the past.  Diminished appetite, no fever or chills.  No history of C. difficile.  It was noted that her symptoms are more associated with liquids.  Query presbyesophagus.  Recommended Zofran and GI referral.  Last colonoscopy in 2014 which found a single tubular adenoma.  No recommended interval for repeating her colonoscopy and at her age it is likely we will not complete another 1.  Most recent EGD completed 02/15/2016 for dysphasia.  Findings include web in the upper third of the esophagus status post dilation, small hiatal hernia, normal duodenum, completely healed peptic ulcer disease.  Recommended decrease Protonix to 40 mg daily.  Change Fosamax therapy to parenteral route due to esophageal web.  She also has a history of gastric AVM status post treatment.  Today she states she's doing ok overall. She is accompanied by her son. States her symptoms have been ongoing for about 6-8 weeks. States  she will be regular, 3-4 bowel movements a day, for a week or two. She will then get constipated and will start OTC medications which will "break things loose" and will be regular again and they cycle restarts. Has dark stools, but is on iron. Has chronic low iron (sees hematology). Has abdominal pain mid- to lower abdominal pain which improves after a bowel movement. When she gets constipated and tries to have a bowel movement it will cause her to vomit. Is having some pill dysphagia and occasional regurgitation; her son cuts her larger pills in half. Less common solid food dysphagia; son cuts her food up into small pieces. Denies hematochezia, hematemesis. Denies fever, chills, unintentional weight loss. Denies URI or flu-like symptoms. Denies loss of sense of taste or smell. Denies chest pain, dyspnea, dizziness, lightheadedness, syncope, near syncope. Denies any other upper or lower GI symptoms.  Past Medical History:  Diagnosis Date   Anemia    Anemia, chronic disease 01/21/2018   COPD (chronic obstructive pulmonary disease) ([place])    Dieulafoy lesion of colon 2006   Gastric AVM 2014   Generalized anxiety disorder 07/24/2015   Hard of hearing    Hypertension    Iron deficiency anemia due to chronic blood loss 05/27/2014   Major depression in remission ([place]) 07/24/2015   Osteoporosis    PUD (peptic ulcer disease) 11/27/2015   Gastric and duodenal ulcer on EGD   Stroke ([place])    Unsteady gait    Upper GI bleed 11/27/2015  Past Surgical History:  Procedure Laterality Date   ABDOMINAL HYSTERECTOMY     APPENDECTOMY     COLONOSCOPY  03/2005   Dr. Gala Romney: rectal and rectosigmoid polyps removed   COLONOSCOPY WITH ESOPHAGOGASTRODUODENOSCOPY (EGD) N/A 08/12/2013   Dr. Gala Romney: colonoscopy with tubular adenoma, colonic diverticulosis. EGD with non-critical Schatzki's ring, non-manipulated, and gastric AVM with evidence of recent bleeding, s/p thermal sealing and hemostasis clip    ESOPHAGEAL DILATION  11/27/2015   Procedure: ESOPHAGEAL DILATION;  Surgeon: Daneil Dolin, MD;  Location: [facility];  Service: Endoscopy;;   ESOPHAGOGASTRODUODENOSCOPY  03/2005   Dr. Gala Romney: tiny distal esophageal erosions   ESOPHAGOGASTRODUODENOSCOPY N/A 11/27/2015   RMR: Cricopharyngeus web, status post dilation. Nonbleeding duodenal and gastric ulcers without stigmata of bleeding. Gastric biopsies benign   ESOPHAGOGASTRODUODENOSCOPY N/A 02/14/2016   Procedure: ESOPHAGOGASTRODUODENOSCOPY (EGD);  Surgeon: Daneil Dolin, MD;  Location: [facility];  Service: Endoscopy;  Laterality: N/A;  300   GIVENS CAPSULE STUDY N/A 11/27/2015   NOT DONE, WAS NOT INDICATED AT THE TIME   INTRAMEDULLARY (IM) NAIL INTERTROCHANTERIC Right 12/28/2018   Procedure: INTRAMEDULLARY (IM) NAIL INTERTROCHANTRIC;  Surgeon: Paralee Cancel, MD;  Location: [facility];  Service: Orthopedics;  Laterality: Right;   MALONEY DILATION N/A 02/14/2016   Procedure: Venia Minks DILATION;  Surgeon: Daneil Dolin, MD;  Location: [facility];  Service: Endoscopy;  Laterality: N/A;   small bowel capsule study  03/2005   Dr. Laural Golden: two small Dieulafoy lesions at ascending colon. tiney specks of blood at duodenal bulb but no identified lesions   TONSILLECTOMY      Current Outpatient Medications  Medication Sig Dispense Refill   acetaminophen (TYLENOL) 500 MG tablet Take 500 mg by mouth 2 (two) times daily.      albuterol (PROVENTIL HFA;VENTOLIN HFA) 108 (90 Base) MCG/ACT inhaler INHALE 2 PUFFS INTO THE LUNGS EVERY 4 HOURS AS NEEDED FOR WHEEZING OR SHORTNESS OF BREATH. 8.5 Inhaler 4   ALPRAZolam (XANAX) 0.25 MG tablet 1/2 qam and 1 to 2 qhs prn sleep (Patient taking differently: Take 0.125 mg by mouth daily. 1/2 qam and 1 to 2 qhs prn sleep) 45 tablet 4   amLODipine (NORVASC) 2.5 MG tablet Take 1 tablet (2.5 mg total) by mouth daily. 90 tablet 0   Ascorbic Acid (VITAMIN C PLUS WILD ROSE HIPS PO) Take 1 tablet by mouth daily.      Calcium Carb-Cholecalciferol (CALCIUM + D3 PO) Take 1 tablet by mouth daily.      citalopram (CELEXA) 10 MG tablet TAKE ONE TABLET BY MOUTH DAILY. 90 tablet 1   Cyanocobalamin (VITAMIN B 12 PO) Take 1,000 mcg by mouth every other day.      ferrous sulfate 325 (65 FE) MG tablet Take 1 tablet (325 mg total) by mouth 3 (three) times daily after meals for 14 days. (Patient taking differently: Take 325 mg by mouth daily. ) 42 tablet 0   KLOR-CON M20 20 MEQ tablet TAKE 1 TABLET BY MOUTH TWICE A DAY (Patient taking differently: Take 20 mEq by mouth daily. ) 180 tablet 1   mupirocin ointment (BACTROBAN) 2 % Apply to affected area 1 times daily (Patient taking differently: Apply 1 application topically daily as needed (left buttocks for skin breakdown.). ) 22 g 2   ondansetron (ZOFRAN ODT) 4 MG disintegrating tablet Take 1 tablet (4 mg total) by mouth every 8 (eight) hours as needed for nausea or vomiting. 20 tablet 0   OVER THE COUNTER MEDICATION Take 1 tablet  by mouth daily. probiotic      oxyCODONE (OXY IR/ROXICODONE) 5 MG immediate release tablet 1  tablets 3 times daily as needed severe pain 60 tablet 0   pantoprazole (PROTONIX) 40 MG tablet TAKE ONE TABLET BY MOUTH DAILY FOR TREATMENT FOR THE ULCER. 90 tablet 0   polyethylene glycol (MIRALAX) 17 g packet Take 17 g by mouth daily as needed. 14 each 0   Probiotic Product (PROBIOTIC PO) Take 1 capsule by mouth daily. "10 Strands"     sennosides-docusate sodium (SENOKOT-S) 8.6-50 MG tablet Take 1 tablet by mouth daily.     sucralfate (CARAFATE) 1 g tablet Take 1 tablet (1 g total) by mouth 3 (three) times daily as needed. May break up and mix in water 42 tablet 1   SYMBICORT 80-4.5 MCG/ACT inhaler INHALE 2 PUFFS INTO THE LUNGS TWICE DAILY. 30.6 g 0   tiotropium (SPIRIVA HANDIHALER) 18 MCG inhalation capsule Place 1 capsule (18 mcg total) into inhaler and inhale daily. 90 capsule 0   No current facility-administered medications for this  visit.    Facility-Administered Medications Ordered in Other Visits  Medication Dose Route Frequency Provider Last Rate Last Dose   sodium chloride flush (NS) 0.9 % injection 10 mL  10 mL Intracatheter PRN Baird Cancer, PA-C        Allergies as of 05/18/2019   (No Known Allergies)    Family History  Problem Relation Age of Onset   Hypertension Mother    Stroke Mother    Leukemia Father 60   Colon cancer Neg Hx     Social History   Socioeconomic History   Marital status: Married    Spouse name: Not on file   Number of children: 3   Years of education: Not on file   Highest education level: Not on file  Occupational History   Occupation: retired from [employer]: Not hard at all   Food insecurity    Worry: Never true    Inability: Never true   Transportation needs    Medical: No    Non-medical: No  Tobacco Use   Smoking status: Former Smoker    Packs/day: 0.50    Years: 70.00    Pack years: 35.00    Types: Cigarettes    Quit date: 08/26/2014    Years since quitting: 4.7   Smokeless tobacco: Never Used   Tobacco comment: about 1/2 ppd  Substance and Sexual Activity   Alcohol use: No    Alcohol/week: 0.0 standard drinks   Drug use: No   Sexual activity: Not on file    Comment: married  Lifestyle   Physical activity    Days per week: 0 days    Minutes per session: 0 min   Stress: Not at all  Relationships   Social connections    Talks on phone: More than three times a week    Gets together: More than three times a week    Attends religious service: More than 4 times per year    Active member of club or organization: Yes    Attends meetings of clubs or organizations: 1 to 4 times per year    Relationship status: Married  Other Topics Concern   Not on file  Social History Narrative   Lives with husband and son   Caffeine use: 1 cup a day of coffee   Diet coke- 16oz daily   Right  handed  Review of Systems: General: Negative for anorexia, weight loss, fever, chills, fatigue, weakness. ENT: Negative for hoarseness, difficulty swallowing. CV: Negative for chest pain, angina, palpitations, peripheral edema.  Respiratory: Negative for dyspnea at rest, cough, sputum, wheezing.  GI: See history of present illness. Endo: Negative for unusual weight change.  Heme: Negative for bruising or bleeding. Allergy: Negative for rash or hives.   Physical Exam: BP (!) 120/56    Pulse 79    Temp (!) 96.2 F (35.7 C) (Temporal)    Ht 4\' 11"  (1.499 m)    Wt 105 lb (47.6 kg) Comment: wt stated by son, unable to stand on scale   LMP  (LMP Unknown)    BMI 21.21 kg/m  General:   Alert and oriented. Pleasant and cooperative. Well-nourished and well-developed. Sitting in a wheelchair, difficulty standing. Eyes:  Without icterus, sclera clear and conjunctiva pink.  Ears:  Hard of hearing. Cardiovascular:  S1, S2 present without murmurs appreciated. Extremities without clubbing or edema. Respiratory:  Clear to auscultation bilaterally. No wheezes, rales, or rhonchi. No distress.  Gastrointestinal:  +BS, soft, non-tender and non-distended. No HSM noted. No guarding or rebound. No masses appreciated.  Rectal:  Deferred  Musculoskalatal:  Symmetrical without gross deformities. Neurologic:  Alert and oriented x4;  grossly normal neurologically. Psych:  Alert and cooperative. Normal mood and affect. Heme/Lymph/Immune: No excessive bruising noted.    05/18/2019 9:47 AM   Disclaimer: This note was dictated with voice recognition software. Similar sounding words can inadvertently be transcribed and may not be corrected upon review.

## 2019-05-18 NOTE — Assessment & Plan Note (Signed)
The patient is struggling with intermittent constipation.  She has not had a bowel movement since Saturday.  She is on iron.  When she is significantly constipated she will sometimes experience diarrhea.  When she does have a good bowel movement her associated abdominal pain resolves.  She is tried over-the-counter medications but this is only intermittently effective.  I will trial her on Amitiza 8 mcg twice daily.  Will provide samples and request a progress report.  Return for follow-up in 2 months.

## 2019-05-18 NOTE — Patient Instructions (Addendum)
Your health issues we discussed today were:   Constipation associated with abdominal pain and vomiting: 1. I am giving you samples of Amitiza 8 mcg 2. Take this twice a day, on a full stomach 3. Call us in 1 to 2 weeks and let us know if it is helping constipation 4. Have your abdominal x-ray completed when you leave our office 5. Call us if you have any worsening or severe symptoms  Dysphagia (difficulty swallowing): 1. As we discussed, you can call the pharmacy and ask if any of your medications may be crushed or pills emptied into applesauce or pudding 2. Eat slowly.  Eat softer foods and cut foods into small pieces 3. Further information about diet modifications for swallowing difficulties below 4. If food gets stuck and will not go up or will not go down call our office or proceed to the emergency room 5. Continue your acid blocker  Overall I recommend:  1. Continue your other current medications 2. Return for follow-up in 2 months 3. Call us if you have any questions or concerns.   Because of recent events of COVID-19 ("Coronavirus"), follow CDC recommendations:  Wash your hand frequently Avoid touching your face Stay away from people who are sick If you have symptoms such as fever, cough, shortness of breath then call your healthcare provider for further guidance If you are sick, STAY AT HOME unless otherwise directed by your healthcare provider. Follow directions from state and national officials regarding staying safe   At [facility] we value your feedback. You may receive a survey about your visit today. Please share your experience as we strive to create trusting relationships with our patients to provide genuine, compassionate, quality care.  We appreciate your understanding and patience as we review any laboratory studies, imaging, and other diagnostic tests that are ordered as we care for you. Our office policy is 5 business days for review of these  results, and any emergent or urgent results are addressed in a timely manner for your best interest. If you do not hear from our office in 1 week, please contact us.   We also encourage the use of MyChart, which contains your medical information for your review as well. If you are not enrolled in this feature, an access code is on this after visit summary for your convenience. Thank you for allowing [place] to be involved in your care.  It was great to see you today!  I hope you have a great Fall!!       Dysphagia Eating Plan, Minced and Moist Foods This eating plan is for people with moderate swallowing problems who are transitioning from pureed to solid foods. Moist and minced foods are soft and cut into very small chunks so that they can be swallowed safely. On this eating plan, you may be instructed to drink liquids that are thickened. Work with your health care provider and your diet and nutrition specialist (dietitian) to make sure that you are following the diet safely and getting all the nutrients you need. What are tips for following this plan? General guidelines for foods   You may eat foods that are soft and moist.  Always test food texture before taking a bite. Poke food with a fork or spoon to make sure it is tender.  Take small bites. Each bite should be smaller than your little finger nail (about 4 mm by 4 mm).  If you were on a pureed food eating plan, you may still  eat any of the foods included in that diet.  Avoid foods that are dry, hard, sticky, chewy, coarse, or crunchy.  Avoid foods that separate into thin liquids and solids, such as cereal with milk or chunky soups.  Avoid liquids that have seeds or chunks.  If instructed by your health care provider, thicken liquids. Follow your health care provider's instructions about what products to use, how to do this, and to what thickness. ? You may use a commercial thickener, rice cereal, or potato flakes. ? Thickened  liquids are usually a "pudding-like" consistency, or they may be as thick as honey or thick enough to eat with a spoon. Cooking  You may need to use a blender, whisk, or masher to soften some of your foods.  To moisten foods, you may add liquids while you are blending, mashing, or grinding your foods to the right consistency. These liquids include gravies, sauces, vegetable or fruit juice, milk, half and half, or water.  Reheat foods slowly to prevent a tough crust from forming. Meal planning  Eat a variety of foods in order to get all the nutrients you need.  Follow your meal plan as told by your health care provider or dietitian. What foods are allowed? Grains Soaked soft breads without nuts or seeds. Pancakes, sweet rolls, pastries, and [place] toast that have been moistened with syrup or sauce. Well-cooked pasta, noodles, rice, and bread dressing in very small pieces and thick sauce. Soft dumplings or spaetzle in very small pieces and butter or gravy. Soft-cooked cereals. Vegetables Very soft, well-cooked vegetables in very small pieces. Soft-cooked, mashed potatoes. Thickened vegetable juice. Fruits Canned or cooked fruits that are soft or moist and do not have skin or seeds. Fresh, soft bananas. Thickened fruit juices. Meat and other protein foods Tender, moist, and finely minced or ground meats or poultry. Moist meatballs or meatloaf. Fish without bones. Scrambled, poached, or soft-cooked eggs. Tofu. Tempeh and meat alternatives in very small pieces. Well-cooked, moistened and mashed beans, baked beans, peas, and other legumes. Dairy Thickened milk. Cream cheese. Yogurt. Cottage cheese. Sour cream. Fats and oils Butter. Margarine. Cream for cereal, depending on liquid consistency allowed. Gravy. Cream sauces. Mayonnaise. Sweets and desserts Pudding. Custard. Ice cream and sherbet. Whipped toppings. Soft, moist cakes. Icing. Jelly. Jams and preserves without seeds. Seasoning and  other foods Sauces and salsas that have soft chunks that are smaller than 73mm. Salad dressings. Casseroles with small pieces of tender meat. All seasonings and sweeteners. Beverages Anything prepared at the thickness recommended by your dietitian. What foods are not allowed? Grains Breads that are hard or have nuts or seeds. Dry biscuits, pancakes, waffles, and bread dressing. Coarse cereals. Cereals that have nuts, seeds, dried fruits, or coconut. Sticky rice. Large pieces of pasta. Vegetables All raw vegetables. Tough, fibrous, chewy, or stringy cooked vegetables, such as celery, peas, broccoli, cabbage, Brussels sprouts, and asparagus. Potato skins. Potato and other vegetable chips. Fried or French-fried potatoes. Cooked corn and peas. Fruits Hard, crunchy, stringy, high-pulp, and juicy raw fruits such as apples, pineapple, papaya, and watermelon. Fruits with skins and seeds, such as grapes. Dried fruit and fruit leather. Meats and other protein foods Large pieces of meat. Dry, tough meats, such as bacon, sausage, and hot dogs. Chicken, [place], or fish with skin and bones. Crunchy peanut butter. Nuts. Seeds. Dairy Yogurt with nuts, seeds, or large chunks. Large chunks of cheese. Frozen desserts and milk consistency not allowed by your dietitian. Sweets and desserts Coarse, hard,  chewy, or sticky desserts. Any dessert with nuts, seeds, coconut, pineapple, or dried fruit. Bread pudding. Seasoning and other foods Soups and casseroles with large chunks. Sandwiches. Pizza. Summary  Moist and minced foods can be helpful for people with moderate swallowing problems.  On the dysphagia eating plan, you may eat foods that are soft, moist, and cut into pieces smaller than 10mm by 91mm.  You may be instructed to thicken liquids. Follow your health care provider's instructions about how to do this and to what consistency. This information is not intended to replace advice given to you by your health  care provider. Make sure you discuss any questions you have with your health care provider. Document Released: 08/12/2005 Document Revised: 12/03/2018 Document Reviewed: 11/22/2016 Elsevier Patient Education  2020 [employer].

## 2019-05-18 NOTE — Assessment & Plan Note (Signed)
The patient describes lower abdominal pain associated with her constipation that resolves after a bowel movement.  She is not had a bowel movement since Saturday and on exam today she is moderately tender in her lower abdomen.  Further management of constipation as per above, which I feel is contributing to the majority of her abdominal pain symptoms.  Follow-up in 2 to 3 months.  Notify us of any worsening or severe symptoms.

## 2019-05-18 NOTE — Assessment & Plan Note (Signed)
When the patient is significantly constipated and trying to have a bowel movement she will have vomiting without nausea.  This is not frequent.  I feel this is likely related to her constipation and associated abdominal pain.  Straining possibly causing worsening vomiting.  Further management as per below.  Follow-up in 2 months.

## 2019-05-22 ENCOUNTER — Other Ambulatory Visit: Payer: Self-pay | Admitting: Family Medicine

## 2019-05-22 DIAGNOSIS — D500 Iron deficiency anemia secondary to blood loss (chronic): Secondary | ICD-10-CM

## 2019-05-22 DIAGNOSIS — E876 Hypokalemia: Secondary | ICD-10-CM

## 2019-05-24 ENCOUNTER — Other Ambulatory Visit: Payer: Self-pay

## 2019-05-24 ENCOUNTER — Ambulatory Visit (INDEPENDENT_AMBULATORY_CARE_PROVIDER_SITE_OTHER): Payer: Medicare Other | Admitting: Family Medicine

## 2019-05-24 VITALS — Temp 100.9°F

## 2019-05-24 DIAGNOSIS — J181 Lobar pneumonia, unspecified organism: Secondary | ICD-10-CM

## 2019-05-24 DIAGNOSIS — J189 Pneumonia, unspecified organism: Secondary | ICD-10-CM

## 2019-05-24 MED ORDER — SUCRALFATE 1 G PO TABS: 1.0000 g | ORAL_TABLET | Freq: Three times a day (TID) | ORAL | 1 refills | Status: DC | PRN

## 2019-05-24 MED ORDER — AZITHROMYCIN 250 MG PO TABS: ORAL_TABLET | ORAL | 0 refills | Status: DC

## 2019-05-24 NOTE — Progress Notes (Signed)
   Subjective:    Patient ID: Carol Massey, female    DOB: 11-Sep-1925, 85 y.o.   MRN: [id number]  Cough This is a recurrent problem. The cough is non-productive. Pertinent negatives include no chest pain, chills, ear pain, eye redness, fever, rhinorrhea, shortness of breath or wheezing. Associated symptoms comments: Dry cough. HHN heard crackles in left lower lung this morning. Son states she has also had some shaking occur on right side.  Patient's had some cough off and on she pretty much stays at home they try to keep other people away from her but home health nurse does come in as well as occasional family member the patient has had some intermittent cough over the past few days no wheezing or difficulty breathing feeding okay no fevers.   Son would also like a refill of Amitiza 8 mcg that was prescribed by GI. I did discuss this with the son he will call the GI to see if they have more samples if not they will be able to send in a prescription.    Review of Systems  Constitutional: Negative for activity change, chills, fatigue and fever.  HENT: Positive for congestion. Negative for ear pain and rhinorrhea.   Eyes: Negative for discharge and redness.  Respiratory: Positive for cough. Negative for shortness of breath and wheezing.   Cardiovascular: Negative for chest pain and leg swelling.  Gastrointestinal: Positive for nausea. Negative for constipation and diarrhea.   Proper PPE was used, patient was seen outside at her car    Objective:   Physical Exam  Heart rate controlled skin warm dry temperature 100.9 extremities no edema some crackles in the left lower base no respiratory distress    Assessment & Plan:  I do recommend COVID testing If the patient worsens over the next 24 to 48 hours ER Antibiotic Z-Pak as directed Probable community-acquired pneumonia Warning signs were discussed with the family  As for the constipation Charlett Blake seems to be helping he will connect with  GI to see if they can get more samples

## 2019-05-25 ENCOUNTER — Telehealth: Payer: Self-pay | Admitting: Internal Medicine

## 2019-05-25 ENCOUNTER — Other Ambulatory Visit: Payer: Self-pay | Admitting: *Deleted

## 2019-05-25 ENCOUNTER — Telehealth: Payer: Self-pay | Admitting: Family Medicine

## 2019-05-25 DIAGNOSIS — Z20822 Contact with and (suspected) exposure to covid-19: Secondary | ICD-10-CM

## 2019-05-25 MED ORDER — LUBIPROSTONE 8 MCG PO CAPS: 8.0000 ug | ORAL_CAPSULE | Freq: Two times a day (BID) | ORAL | 5 refills | Status: DC

## 2019-05-25 NOTE — Telephone Encounter (Signed)
FYI EG. RX sent.

## 2019-05-25 NOTE — Telephone Encounter (Signed)
[name] states patient took a walk yesterday and fever went up to 102. This morning 98 temperature is better do you want her still to get Covid testing today. Please advise

## 2019-05-25 NOTE — Telephone Encounter (Signed)
Spoke with pt's son. Pt is take Amitiza 8 mcg as directed and having normal bowel movements. Pt would like RX sent to [facility]. Routing to EG & [facility] refill in the Absence of EG.

## 2019-05-25 NOTE — Addendum Note (Signed)
Addended by: Mahala Menghini on: 05/25/2019 08:02 PM   Modules accepted: Orders

## 2019-05-25 NOTE — Telephone Encounter (Signed)
Yes I do recommend COVID testing-this can be through the drive-through testing at the hospital Is the patient having any signs that she is I am having a hard time breathing? Is she eating okay drinking okay?

## 2019-05-25 NOTE — Telephone Encounter (Signed)
PATIENT SON CALLED AND SAID THE AMETIZA SAMPLES WORKED, SEND A PRESCRIPTION TO [facility]-PATIENT HAS ENOUGH PILLS LEFT FOR TODAY AND TOMORROW-ARE THERE ANY SAMPLES TO HOLD HER OVER-  [place] [phone]

## 2019-05-25 NOTE — Telephone Encounter (Signed)
So for now I think doing the antibiotics we prescribed in home care is fine but if breathing gets significantly worse then the next step would be ER or if progressive persistent illness please read touch base with [place] Hopefully should see improvement over the next 48 hours

## 2019-05-25 NOTE — Telephone Encounter (Signed)
Discussed with pt's son and he verbalized understanding. Son wants to know what dr Nicki Reaper thinks about getting hospice to come in because he thinks they could help out more than advance home care. He states he doesn't know if that is something she should have right now or not just wanted dr scott's opinion.

## 2019-05-25 NOTE — Telephone Encounter (Signed)
Discussed with son [name] and he states they were out running errands so he already took her to get tested. She is eating and drinking well. She does get short of breath when she is up walking.

## 2019-05-26 LAB — NOVEL CORONAVIRUS, NAA: SARS-CoV-2, NAA: NOT DETECTED

## 2019-05-27 ENCOUNTER — Telehealth: Payer: Self-pay | Admitting: Internal Medicine

## 2019-05-27 NOTE — Telephone Encounter (Signed)
PATIENT SON CALLED WITH QUESTIONS ABOUT HER AMITEZA  [phone]  SAID HER STOOLS ARE VERY LOOSE AND WANTED TO KNOW IF SHE SHOULD SKIP A DOSE OR GO TO ONE PILL A DAY

## 2019-05-27 NOTE — Telephone Encounter (Signed)
Noted, thanks!

## 2019-05-27 NOTE — Telephone Encounter (Signed)
Glad to hear this.

## 2019-05-27 NOTE — Telephone Encounter (Signed)
Routing message to EG  

## 2019-05-27 NOTE — Telephone Encounter (Signed)
Hard to predict But currently I do not think hospice consult would be the next step Certainly if there is a decline then this opinion would change

## 2019-05-27 NOTE — Telephone Encounter (Signed)
Spoke with pt's son. Pt had a bowel movement this morning and some of it was loose. Pt's son is going to see if pt continues to have loose stool and if so, he is going to hold a day and start the Amitiza back to see if it makes a difference. If pt has problems, pt's son will call back.

## 2019-05-27 NOTE — Telephone Encounter (Signed)
Son advised per Dr Nicki Reaper: Hard to predict But currently Dr Nicki Reaper does not think hospice consult would be the next step Certainly if there is a decline then this opinion would change Son verbalized understanding and wanted you to know her Covid test was negative and she is breathing a lot better-cough still sounds wet but is still non productive and she is doing better

## 2019-05-31 ENCOUNTER — Other Ambulatory Visit: Payer: Self-pay | Admitting: Family Medicine

## 2019-06-01 ENCOUNTER — Telehealth: Payer: Self-pay | Admitting: Family Medicine

## 2019-06-01 NOTE — Telephone Encounter (Signed)
Son(DPR) advised per Dr Nicki Reaper :  recommend monitoring in regards to the blood on the paper if it becomes larger please notify us Also Dr Nicki Reaper recommends senior dose at the pharmacy Family should call ahead if they do not have senior dose there then we can do standard shot here. Son verbalized understanding.

## 2019-06-01 NOTE — Telephone Encounter (Signed)
Son said she had a small amount of blood on the toilet paper this morning when she had a bowel movement.  Bright red.  Also wants to know if they should come here for the flu shot or go to the pharmacy for the senior shot?

## 2019-06-01 NOTE — Telephone Encounter (Signed)
I would recommend monitoring in regards to the blood on the paper if it becomes larger please notify us Also I recommend senior dose at the pharmacy Family should call ahead if they do not have senior dose there then we can do standard shot here

## 2019-06-02 ENCOUNTER — Telehealth: Payer: Self-pay | Admitting: Family Medicine

## 2019-06-02 DIAGNOSIS — L89610 Pressure ulcer of right heel, unstageable: Secondary | ICD-10-CM | POA: Diagnosis not present

## 2019-06-02 DIAGNOSIS — M80051D Age-related osteoporosis with current pathological fracture, right femur, subsequent encounter for fracture with routine healing: Secondary | ICD-10-CM | POA: Diagnosis not present

## 2019-06-02 DIAGNOSIS — I10 Essential (primary) hypertension: Secondary | ICD-10-CM | POA: Diagnosis not present

## 2019-06-02 DIAGNOSIS — D500 Iron deficiency anemia secondary to blood loss (chronic): Secondary | ICD-10-CM | POA: Diagnosis not present

## 2019-06-02 NOTE — Telephone Encounter (Signed)
Yes. Hold Amitiza while patient having loose stools. Make sure she's drinking plenty of fluids. If worsening or persistent loose stools, let us know. She may have just picked up a 'stomach bug" or ate something that didn't agree with her; hopefully this is the case and it should resolve on it's own.  Let us know if any problems.

## 2019-06-02 NOTE — Telephone Encounter (Signed)
Reclast reminder placed in [place] file. Flu vaccination put in [place].

## 2019-06-02 NOTE — Telephone Encounter (Signed)
Noted. Pt's son is aware.

## 2019-06-02 NOTE — Telephone Encounter (Signed)
(  Carol Massey )from [facility] called to speak with doctor on patient has sent several messages concerning patient hasnt gotten response. Patient is recovering from hip surgery.[phone]

## 2019-06-02 NOTE — Telephone Encounter (Signed)
Nurses Please put into the system that she did have her senior dose flu vaccine today  FYI we are delaying her re-clast injection until springtime Please put into the reminder file for this to be reminded in March 2021  Family aware

## 2019-06-02 NOTE — Telephone Encounter (Signed)
After discussion with family they have decided to hold off on Reclast until the spring then we will make a decision

## 2019-06-07 ENCOUNTER — Other Ambulatory Visit: Payer: Self-pay | Admitting: Family Medicine

## 2019-06-09 ENCOUNTER — Telehealth: Payer: Self-pay | Admitting: Family Medicine

## 2019-06-09 ENCOUNTER — Telehealth: Payer: Self-pay | Admitting: Internal Medicine

## 2019-06-09 DIAGNOSIS — K921 Melena: Secondary | ICD-10-CM

## 2019-06-09 DIAGNOSIS — K922 Gastrointestinal hemorrhage, unspecified: Secondary | ICD-10-CM

## 2019-06-09 NOTE — Telephone Encounter (Signed)
Spoke with pts son. Pt has had some darker than normal stool twice since her apt. Monday 06/06/2020, pt had the darker stool and the water was cloudy. When pt wiped, there was a small amount of blood on the tissue. Pt also vomited once without nausea. Pt son says that seems to happen when pt has a bowel movement that's dark. Pt didn't feel lightheaded and seem to be ok. Pt didn't take Zofran, which was prescribed by her PCP since she only vomited once on Monday. Pt is taking Amitiza 8 mcg bid and an iron supplement. The stool isn't black and with taking the iron supplement, pts stool is usually dark brown. Pt's son wants to know if pts appointments should be moved up. Pt is scheduled for 07/13/2019.

## 2019-06-09 NOTE — Telephone Encounter (Signed)
Pt son contacted and verbalized understanding  

## 2019-06-09 NOTE — Telephone Encounter (Signed)
Pt's son called to say EG had seen patient on 9/22 and she is to follow up on 11/17. She is finding blood when she wipes and should she be seen sooner or what would EG recommend. Please advise. [phone]

## 2019-06-09 NOTE — Telephone Encounter (Signed)
I do think it is a good idea to touch base with GI but they may not necessarily do anything currently other than to watch this

## 2019-06-09 NOTE — Telephone Encounter (Signed)
Son calling to tell us that Monday mom had loose smelly stools and she threw up once. Felt ok. Didn't go yesterday and today she went and there was a little bit of blood on the toilet paper again.  Should they call the GI doctor again?

## 2019-06-09 NOTE — Telephone Encounter (Signed)
Please advise. Thank you

## 2019-06-11 ENCOUNTER — Telehealth: Payer: Self-pay | Admitting: Internal Medicine

## 2019-06-11 ENCOUNTER — Other Ambulatory Visit: Payer: Self-pay

## 2019-06-11 DIAGNOSIS — K922 Gastrointestinal hemorrhage, unspecified: Secondary | ICD-10-CM

## 2019-06-11 NOTE — Telephone Encounter (Signed)
See other phone note

## 2019-06-11 NOTE — Telephone Encounter (Signed)
Please have the patient go to the lab. STAT CBC ordered. Will call with results when we get them. If any severe/worsening bleeding or associated weakness, dizziness, syncope, shortness of breath, chest pain, etc please call us and/or go to the ER.

## 2019-06-11 NOTE — Telephone Encounter (Signed)
Spoke with pt's son and informed him to take pt to [facility] ASAP for STAT labs.  Also, informed son that if pt has any severe/worsening bleeding or associated weakness, dizziness, syncope, shortness of breath, chest pain, etc please call us and/or go to the ER. Informed son that we would be in touch once we receive results.  He voiced understanding.

## 2019-06-11 NOTE — Telephone Encounter (Signed)
Routing to Angie 

## 2019-06-11 NOTE — Telephone Encounter (Signed)
Noted, no further recommendations. 

## 2019-06-11 NOTE — Telephone Encounter (Signed)
I spoke to [name], he said they have been at [facility] for an hour and do not see much, he is not sure what is going on. He would like lab orders for [facility]. I told him I will enter the labs for [place] and he can go to either one and they can see the order. Per Walden Field, NP, this is to be a STAT CBC. I have entered and released the order.

## 2019-06-11 NOTE — Telephone Encounter (Signed)
Pt's son, [name], called to say that he and the patient have been waiting 15 minutes at [facility] and 3 people are still ahead of them. He is wanting to leave and take her to [facility] on [address]. Please advise, [phone] Please fax orders to [facility]

## 2019-06-11 NOTE — Telephone Encounter (Signed)
Pt's son, [name], was calling back from the other day. He was asking to speak with AM. Please call him at [phone]

## 2019-06-11 NOTE — Addendum Note (Signed)
Addended by: Gordy Levan, Willena Jeancharles A on: 06/11/2019 10:03 AM   Modules accepted: Orders

## 2019-06-11 NOTE — Telephone Encounter (Signed)
Pt had 2 dark stools this morning. Stool was black this morning. When pt wiped and saw a little blood on toilet paper. See phone note. Previous message sent.

## 2019-06-12 LAB — CBC WITH DIFFERENTIAL/PLATELET
Basophils Absolute: 0.1 10*3/uL (ref 0.0–0.2)
Basos: 1 %
EOS (ABSOLUTE): 0.1 10*3/uL (ref 0.0–0.4)
Eos: 2 %
Hematocrit: 37.2 % (ref 34.0–46.6)
Hemoglobin: 12.3 g/dL (ref 11.1–15.9)
Immature Grans (Abs): 0 10*3/uL (ref 0.0–0.1)
Immature Granulocytes: 0 %
Lymphocytes Absolute: 1.3 10*3/uL (ref 0.7–3.1)
Lymphs: 25 %
MCH: 32.3 pg (ref 26.6–33.0)
MCHC: 33.1 g/dL (ref 31.5–35.7)
MCV: 98 fL — ABNORMAL HIGH (ref 79–97)
Monocytes Absolute: 0.5 10*3/uL (ref 0.1–0.9)
Monocytes: 9 %
Neutrophils Absolute: 3.4 10*3/uL (ref 1.4–7.0)
Neutrophils: 63 %
Platelets: 239 10*3/uL (ref 150–450)
RBC: 3.81 x10E6/uL (ref 3.77–5.28)
RDW: 13 % (ref 11.7–15.4)
WBC: 5.3 10*3/uL (ref 3.4–10.8)

## 2019-06-15 ENCOUNTER — Telehealth: Payer: Self-pay | Admitting: Internal Medicine

## 2019-06-15 ENCOUNTER — Inpatient Hospital Stay (HOSPITAL_COMMUNITY): Payer: Medicare Other

## 2019-06-15 DIAGNOSIS — K5900 Constipation, unspecified: Secondary | ICD-10-CM

## 2019-06-15 DIAGNOSIS — R1030 Lower abdominal pain, unspecified: Secondary | ICD-10-CM

## 2019-06-15 NOTE — Telephone Encounter (Addendum)
I spoke to [name] and he is aware that pt's hemoglobin on 06/11/2019 was up to 12.3 from 8.5 in May this year.  He said pt had very loose stool on Sat and it was very dark. Then she has not had any Bm's since. Takes Amitiza 8 mcg bid. Forwarding to Aliene Altes, PA who is doing hospital and calls today Randall Hiss is on PAL today).  Cyril Mourning, please advise!

## 2019-06-15 NOTE — Telephone Encounter (Signed)
Noted.  Less patient stool completely black?  They should continue to monitor for any ongoing black tarry stools or bright red blood per rectum.  If the patient develops lightheadedness, dizziness, feeling like she will pass out, or significant weakness she should proceed to the emergency department.  Further recommendations regarding constipation when Randall Hiss returns tomorrow.

## 2019-06-15 NOTE — Telephone Encounter (Signed)
Pt's son, [name], called to say that he thought the labs that the patient did on  Friday were STAT and he hadn't heard from anyone. I told him that AM has been out and I will put a phone note in for DS to see if the results are available. He also said that she was taking Amitiza twice a day and she hasn't had a BM since Saturday. Please call [phone]

## 2019-06-15 NOTE — Telephone Encounter (Signed)
[name] is aware and knows we will call when Walden Field, NP addresses tomorrow.

## 2019-06-16 MED ORDER — LUBIPROSTONE 24 MCG PO CAPS: 24.0000 ug | ORAL_CAPSULE | Freq: Two times a day (BID) | ORAL | 3 refills | Status: DC

## 2019-06-16 NOTE — Addendum Note (Signed)
Addended by: Gordy Levan, Ayrianna Mcginniss A on: 06/16/2019 03:34 PM   Modules accepted: Orders

## 2019-06-16 NOTE — Telephone Encounter (Signed)
Rx sent for 30 days and 3 refills. Call and let us know if any problems or if it doesn't help constipation

## 2019-06-16 NOTE — Telephone Encounter (Signed)
Agree with previous recommendations.  Let's increase Amitiza to 24 mcg twice daily. We do not have any samples. If they're ok with this I can send in a prescription to their pharmacy.

## 2019-06-16 NOTE — Telephone Encounter (Signed)
Carol Massey is aware

## 2019-06-16 NOTE — Telephone Encounter (Signed)
[name] is aware and OK to send the Amitiza 24 mcg to [facility].

## 2019-06-17 ENCOUNTER — Telehealth: Payer: Self-pay | Admitting: *Deleted

## 2019-06-17 ENCOUNTER — Telehealth: Payer: Self-pay | Admitting: Family Medicine

## 2019-06-17 DIAGNOSIS — K5900 Constipation, unspecified: Secondary | ICD-10-CM

## 2019-06-17 DIAGNOSIS — R1111 Vomiting without nausea: Secondary | ICD-10-CM

## 2019-06-17 NOTE — Telephone Encounter (Signed)
Noted, no further recommendations at this time. 

## 2019-06-17 NOTE — Telephone Encounter (Signed)
I agree with referral to gastroenterology [facility] If unsuccessful use [facility]  Certainly if she has severe deterioration with frequent vomiting and severe pain she may need to go to the ER but otherwise see below  We can do a follow-up car visit at the families discretion next week or the week after

## 2019-06-17 NOTE — Telephone Encounter (Signed)
Son(michael) would like a referral to GI doctor in [place]. Patient is constipated but went today but also she threw up when she went to the bathroom. Please advise

## 2019-06-17 NOTE — Telephone Encounter (Signed)
ok 

## 2019-06-17 NOTE — Telephone Encounter (Signed)
[name] called back and said the suppository helped. Pt had BM and then started having some diarrhea. She is sitting on the pot with the diarrhea and has started vomiting. He said her vitals and temp were good this morning when the nurse was there. The only time she complained with her abdominal pain was when she was so constipated before she got relief.  I spoke to Walden Field, NP and he said to monitor and see how she does. If she continues vomiting then he should take her to the ED. Son is aware and said he will give her a Zofran and see how she does and will take to ED if she continues vomiting.

## 2019-06-17 NOTE — Telephone Encounter (Signed)
GI referral placed. Contacted patient to let him know. Son verbalized understanding. Son did mention an area under pt heart area on left side that has been there for a while. Son states that the area is sore, tender and painful. When pt has a BM, the area is only sore and tender. Son states that he told the GI this when they went for the initial visit ( office) but states that the doctor did not seem concerned about. Son wanted this mentioned to provider because he was not sure if he had spoke about it before. Please advise. Thank you. (son is aware that Dr.Scott is out of the office this evening.)

## 2019-06-17 NOTE — Telephone Encounter (Signed)
Pt's son called in and stated that Janett Billow from [facility] was there and needed a verbal from [place] to administer Bisacodyl 10 mg suppository to help pt with constipation.  Spoke with Randall Hiss and he gave permission.  I gave Janett Billow the verbal consent per Randall Hiss.  Randall Hiss also recommended that the son pick up the increased Amitiza (24 mcg bid). Informed son that this may take up to 7 days to work and to call us if she's still struggling even after Amitiza 24 x 7 days.  Son voiced understanding.

## 2019-06-18 ENCOUNTER — Telehealth: Payer: Self-pay | Admitting: Internal Medicine

## 2019-06-18 ENCOUNTER — Encounter: Payer: Self-pay | Admitting: Gastroenterology

## 2019-06-18 NOTE — Telephone Encounter (Signed)
Consult scheduled with Janett Billow on 11/4 at 11:30am.

## 2019-06-18 NOTE — Telephone Encounter (Signed)
Hi Dr. Henrene Pastor, we have received a referral from patient's PCP. She is established with Dr. Gala Romney but wishes to transfer to [place] because she has been dealing with constipation and feels like she is not receiving the care she needs. Her records are in [place]. Could you please review them and advise on scheduling? Thank you.

## 2019-06-18 NOTE — Telephone Encounter (Signed)
Discussed with pt's son mike and he verbalized understanding and transferred up to front to schedule car visit.

## 2019-06-18 NOTE — Telephone Encounter (Signed)
Schedule her to see one of the advanced practitioners in our practice.  Thanks

## 2019-06-21 ENCOUNTER — Ambulatory Visit (INDEPENDENT_AMBULATORY_CARE_PROVIDER_SITE_OTHER): Payer: Medicare Other | Admitting: Family Medicine

## 2019-06-21 ENCOUNTER — Other Ambulatory Visit (HOSPITAL_COMMUNITY)
Admission: RE | Admit: 2019-06-21 | Discharge: 2019-06-21 | Disposition: A | Discharge status: Home / Self Care | Payer: Medicare Other | Source: Ambulatory Visit | Attending: Family Medicine | Admitting: Family Medicine

## 2019-06-21 ENCOUNTER — Other Ambulatory Visit: Payer: Self-pay

## 2019-06-21 ENCOUNTER — Ambulatory Visit (HOSPITAL_COMMUNITY)
Admission: RE | Admit: 2019-06-21 | Discharge: 2019-06-21 | Disposition: A | Discharge status: Home / Self Care | Payer: Medicare Other | Source: Ambulatory Visit | Attending: Family Medicine | Admitting: Family Medicine

## 2019-06-21 DIAGNOSIS — R109 Unspecified abdominal pain: Secondary | ICD-10-CM | POA: Insufficient documentation

## 2019-06-21 LAB — CREATININE, SERUM
Creatinine, Ser: 0.75 mg/dL (ref 0.44–1.00)
GFR calc Af Amer: >60 mL/min (ref >60)
GFR calc non Af Amer: >60 mL/min (ref >60)

## 2019-06-21 MED ORDER — IOHEXOL 300 MG/ML  SOLN
100.0000 mL | Freq: Once | INTRAMUSCULAR | Status: AC | PRN
  Administered 2019-06-21: 100 mL via INTRAVENOUS

## 2019-06-21 MED ORDER — IOHEXOL 9 MG/ML PO SOLN: 500.0000 mL | ORAL | Status: AC

## 2019-06-21 NOTE — Progress Notes (Signed)
  Subjective:     Patient ID: Carol Massey, female   DOB: September 23, 1925, 85 y.o.   MRN: [id number]  HPI This 85 year old patient has been having some intermittent troubles with constipation over the past couple months progressively worse causing significant abdominal pain and discomfort.  Also causing intermittent nausea with vomiting.  Has seen a local gastroenterology that placed her on Amitiza unfortunately this is not solve the issue.  Patient over the past couple weeks has had progressive left side abdominal pain some in the left lower abdomen the rest of it mainly in the left upper abdomen along with increasing nausea more difficulty having bowel movements and vomiting.  She has not had any fever.    PMH benign  Review of Systems  Constitutional: Negative for activity change, appetite change and fatigue.  HENT: Negative for congestion and rhinorrhea.   Respiratory: Negative for cough and shortness of breath.   Cardiovascular: Negative for chest pain and leg swelling.  Gastrointestinal: Positive for abdominal pain, constipation, nausea and vomiting. Negative for diarrhea.  Endocrine: Negative for polydipsia and polyphagia.  Skin: Negative for color change.  Neurological: Negative for dizziness and weakness.  Psychiatric/Behavioral: Negative for behavioral problems and confusion.       Objective:   Physical Exam Vitals signs reviewed.  Constitutional:      General: She is not in acute distress. HENT:     Head: Normocephalic and atraumatic.  Eyes:     General:        Right eye: No discharge.        Left eye: No discharge.  Neck:     Trachea: No tracheal deviation.  Cardiovascular:     Rate and Rhythm: Normal rate and regular rhythm.     Heart sounds: Normal heart sounds. No murmur.  Pulmonary:     Effort: Pulmonary effort is normal. No respiratory distress.     Breath sounds: Normal breath sounds.  Abdominal:     Palpations: There is no mass.     Tenderness: There is abdominal  tenderness. There is no rebound.  Lymphadenopathy:     Cervical: No cervical adenopathy.  Skin:    General: Skin is warm and dry.  Neurological:     Mental Status: She is alert.     Coordination: Coordination normal.  Psychiatric:        Behavior: Behavior normal.   Patient with significant tenderness left side of the abdomen more so in the left upper abdomen but some also in the left lower abdomen patient does brace when being palpated  25 minutes was spent with the patient.  This statement verifies that 25 minutes was spent with the patient.  More than 50% of this visit-total duration of the visit-was spent in counseling and coordination of care. The issues that the patient came in for today as reflected in the diagnosis (s) please refer to documentation for further details.     Assessment:     Severe upper abdominal pain left side Some intermittent left lower abdominal pain Constipation recent occurrence with intermittent vomiting     Plan:     We need to rule out potential of developing obstruction or potential mass  Patient has an appointment with gastroenterology in [place] next week  Ideally would like to be able to do CT scan of abdomen this week if the schedule is completely full we will make this an emergent CT scan of abdomen pelvis with contrast

## 2019-06-22 ENCOUNTER — Ambulatory Visit (HOSPITAL_COMMUNITY): Payer: Medicare Other | Admitting: Nurse Practitioner

## 2019-06-22 ENCOUNTER — Other Ambulatory Visit: Payer: Self-pay | Admitting: Gastroenterology

## 2019-06-22 ENCOUNTER — Telehealth: Payer: Self-pay | Admitting: Family Medicine

## 2019-06-22 MED ORDER — LUBIPROSTONE 8 MCG PO CAPS: 8.0000 ug | ORAL_CAPSULE | Freq: Two times a day (BID) | ORAL | 3 refills | Status: DC

## 2019-06-22 NOTE — Telephone Encounter (Signed)
Amitiza 8 mg 1 twice daily, #60, 3 refills

## 2019-06-22 NOTE — Telephone Encounter (Signed)
Prescription sent electronically to pharmacy. Son(DPR) notified. 

## 2019-06-22 NOTE — Telephone Encounter (Signed)
Son is requesting refill on Amitiza 8 mg called into [place]

## 2019-06-28 ENCOUNTER — Other Ambulatory Visit: Payer: Self-pay

## 2019-06-28 ENCOUNTER — Inpatient Hospital Stay (HOSPITAL_COMMUNITY): Payer: Medicare Other | Attending: Nurse Practitioner | Admitting: Nurse Practitioner

## 2019-06-28 DIAGNOSIS — D500 Iron deficiency anemia secondary to blood loss (chronic): Secondary | ICD-10-CM | POA: Diagnosis not present

## 2019-06-28 NOTE — Assessment & Plan Note (Addendum)
1.  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 - Last Feraheme infusion was on 10/28/2018 and 11/05/2018. -She occasionally sees dark stools.  However denies any bright red bleeding per rectum. -Labs on 06/18/2019 showed her hemoglobin 12.4, ferritin 246, percent saturation 60, vitamin B12 over 2000. -Son reports she started on oral iron supplementation which she is taking daily. -Patient reports she occasionally gets constipation alternating with diarrhea.  Her PCP has been following this and has ordered scans that showed no blockage. -She fell and broke her hip in May and had surgery Dec 28, 2018.  She is at home recovering. -I do not recommend any IV Feraheme at this time. -She will follow-up in 4 months with repeat labs.  She may have her labs done at [facility].

## 2019-06-28 NOTE — Patient Instructions (Signed)
[facility] Discharge Instructions     Thank you for choosing [facility] at [facility] to provide your oncology and hematology care.  To afford each patient quality time with our provider, please arrive at least 15 minutes before your scheduled appointment time.   If you have a lab appointment with the Cancer Center please come in thru the Main Entrance and check in at the main information desk.  You need to re-schedule your appointment should you arrive 10 or more minutes late.  We strive to give you quality time with our providers, and arriving late affects you and other patients whose appointments are after yours.  Also, if you no show three or more times for appointments you may be dismissed from the clinic at the providers discretion.     Again, thank you for choosing [facility].  Our hope is that these requests will decrease the amount of time that you wait before being seen by our physicians.       _____________________________________________________________  Should you have questions after your visit to  Cancer Center, please contact our office at [phone] between the hours of 8:00 a.m. and 4:30 p.m.  Voicemails left after 4:00 p.m. will not be returned until the following business day.  For prescription refill requests, have your pharmacy contact our office and allow 72 hours.    Due to Covid, you will need to wear a mask upon entering the hospital. If you do not have a mask, a mask will be given to you at the Main Entrance upon arrival. For doctor visits, patients may have 1 support person with them. For treatment visits, patients can not have anyone with them due to social distancing guidelines and our immunocompromised population.      

## 2019-06-28 NOTE — Progress Notes (Signed)
[place], [place] [address]   CLINIC:  Medical Oncology/Hematology  PCP:  [name], MD [address] [place] [address] [phone]   REASON FOR VISIT: Follow-up for iron deficiency anemia  CURRENT THERAPY: Oral iron   INTERVAL HISTORY:  Carol Massey 85 y.o. female returns for routine follow-up for iron deficiency anemia.  Patient reports she has been having problems with constipation alternating with diarrhea.  She is being followed by her PCP for this.  She denies any bright red bleeding per rectum or melena. Denies any nausea or vomiting. Denies any new pains. Had not noticed any recent bleeding such as epistaxis, hematuria or hematochezia. Denies recent chest pain on exertion, shortness of breath on minimal exertion, pre-syncopal episodes, or palpitations. Denies any numbness or tingling in hands or feet. Denies any recent fevers, infections, or recent hospitalizations. Patient reports appetite at 50% and energy level at 50%.    REVIEW OF SYSTEMS:  Review of Systems  Constitutional: Positive for fatigue.  Gastrointestinal: Positive for constipation and diarrhea.  All other systems reviewed and are negative.    PAST MEDICAL/SURGICAL HISTORY:  Past Medical History:  Diagnosis Date  . Anemia   . Anemia, chronic disease 01/21/2018  . COPD (chronic obstructive pulmonary disease) ([place])   . Dieulafoy lesion of colon 2006  . Gastric AVM 2014  . Generalized anxiety disorder 07/24/2015  . Hard of hearing   . Hypertension   . Iron deficiency anemia due to chronic blood loss 05/27/2014  . Major depression in remission ([place]) 07/24/2015  . Osteoporosis   . PUD (peptic ulcer disease) 11/27/2015   Gastric and duodenal ulcer on EGD  . Stroke ([place])   . Unsteady gait   . Upper GI bleed 11/27/2015   Past Surgical History:  Procedure Laterality Date  . ABDOMINAL HYSTERECTOMY    . APPENDECTOMY    . COLONOSCOPY  03/2005   Dr.  Gala Romney: rectal and rectosigmoid polyps removed  . COLONOSCOPY WITH ESOPHAGOGASTRODUODENOSCOPY (EGD) N/A 08/12/2013   Dr. Gala Romney: colonoscopy with tubular adenoma, colonic diverticulosis. EGD with non-critical Schatzki's ring, non-manipulated, and gastric AVM with evidence of recent bleeding, s/p thermal sealing and hemostasis clip  . ESOPHAGEAL DILATION  11/27/2015   Procedure: ESOPHAGEAL DILATION;  Surgeon: Daneil Dolin, MD;  Location: [facility];  Service: Endoscopy;;  . ESOPHAGOGASTRODUODENOSCOPY  03/2005   Dr. Gala Romney: tiny distal esophageal erosions  . ESOPHAGOGASTRODUODENOSCOPY N/A 11/27/2015   RMR: Cricopharyngeus web, status post dilation. Nonbleeding duodenal and gastric ulcers without stigmata of bleeding. Gastric biopsies benign  . ESOPHAGOGASTRODUODENOSCOPY N/A 02/14/2016   Procedure: ESOPHAGOGASTRODUODENOSCOPY (EGD);  Surgeon: Daneil Dolin, MD;  Location: [facility];  Service: Endoscopy;  Laterality: N/A;  300  . GIVENS CAPSULE STUDY N/A 11/27/2015   NOT DONE, WAS NOT INDICATED AT THE TIME  . INTRAMEDULLARY (IM) NAIL INTERTROCHANTERIC Right 12/28/2018   Procedure: INTRAMEDULLARY (IM) NAIL INTERTROCHANTRIC;  Surgeon: Paralee Cancel, MD;  Location: [facility];  Service: Orthopedics;  Laterality: Right;  . MALONEY DILATION N/A 02/14/2016   Procedure: Venia Minks DILATION;  Surgeon: Daneil Dolin, MD;  Location: [facility];  Service: Endoscopy;  Laterality: N/A;  . small bowel capsule study  03/2005   Dr. Laural Golden: two small Dieulafoy lesions at ascending colon. tiney specks of blood at duodenal bulb but no identified lesions  . TONSILLECTOMY       SOCIAL HISTORY:  Social History   Socioeconomic History  . Marital  status: Married    Spouse name: Not on file  . Number of children: 3  . Years of education: Not on file  . Highest education level: Not on file  Occupational History  . Occupation: retired from [employer]  . Financial resource strain: Not hard at all  . Food  insecurity    Worry: Never true    Inability: Never true  . Transportation needs    Medical: No    Non-medical: No  Tobacco Use  . Smoking status: Former Smoker    Packs/day: 0.50    Years: 70.00    Pack years: 35.00    Types: Cigarettes    Quit date: 08/26/2014    Years since quitting: 4.8  . Smokeless tobacco: Never Used  . Tobacco comment: about 1/2 ppd  Substance and Sexual Activity  . Alcohol use: No    Alcohol/week: 0.0 standard drinks  . Drug use: No  . Sexual activity: Not on file    Comment: married  Lifestyle  . Physical activity    Days per week: 0 days    Minutes per session: 0 min  . Stress: Not at all  Relationships  . Social connections    Talks on phone: More than three times a week    Gets together: More than three times a week    Attends religious service: More than 4 times per year    Active member of club or organization: Yes    Attends meetings of clubs or organizations: 1 to 4 times per year    Relationship status: Married  . Intimate partner violence    Fear of current or ex partner: No    Emotionally abused: No    Physically abused: No    Forced sexual activity: No  Other Topics Concern  . Not on file  Social History Narrative   Lives with husband and son   Caffeine use: 1 cup a day of coffee   Diet coke- 16oz daily   Right handed    FAMILY HISTORY:  Family History  Problem Relation Age of Onset  . Hypertension Mother   . Stroke Mother   . Leukemia Father 30  . Colon cancer Neg Hx     CURRENT MEDICATIONS:  Outpatient Encounter Medications as of 06/28/2019  Medication Sig Note  . Acetaminophen (TYLENOL EXTRA STRENGTH PO) Take by mouth. prn   . albuterol (PROVENTIL HFA;VENTOLIN HFA) 108 (90 Base) MCG/ACT inhaler INHALE 2 PUFFS INTO THE LUNGS EVERY 4 HOURS AS NEEDED FOR WHEEZING OR SHORTNESS OF BREATH.   [name] ALPRAZolam (XANAX) 0.25 MG tablet 1/2 qam and 1 to 2 qhs prn sleep (Patient taking differently: Take 0.125 mg by mouth daily. 1/2  qam and 1 to 2 qhs prn sleep)   . amLODipine (NORVASC) 2.5 MG tablet Take 1 tablet (2.5 mg total) by mouth daily.   . citalopram (CELEXA) 10 MG tablet TAKE ONE TABLET BY MOUTH DAILY.   [name] Cyanocobalamin (VITAMIN B 12 PO) Take 1,000 mcg by mouth every other day.    . ferrous sulfate 325 (65 FE) MG tablet Take 1 tablet (325 mg total) by mouth 3 (three) times daily after meals for 14 days. (Patient taking differently: Take 325 mg by mouth daily. ) 06/21/2019: One daily  . lubiprostone (AMITIZA) 8 MCG capsule Take 1 capsule (8 mcg total) by mouth 2 (two) times daily with a meal.   . ondansetron (ZOFRAN ODT) 4 MG disintegrating tablet Take 1 tablet (  4 mg total) by mouth every 8 (eight) hours as needed for nausea or vomiting.   [name] OVER THE COUNTER MEDICATION Vit C one daily One a day multi vitamin Calcium 600mg  + D  Vit b12 1,000 mcg. One every other day   . oxyCODONE (OXY IR/ROXICODONE) 5 MG immediate release tablet 1  tablets 3 times daily as needed severe pain (Patient not taking: Reported on 06/21/2019)   . pantoprazole (PROTONIX) 40 MG tablet TAKE ONE TABLET BY MOUTH DAILY FOR TREATMENT FOR THE ULCER.   [name] polyethylene glycol (MIRALAX) 17 g packet Take 17 g by mouth daily as needed.   . potassium chloride SA (K-DUR) 20 MEQ tablet Take 1 tablet (20 mEq total) by mouth daily.   . Probiotic Product (PROBIOTIC PO) Take 1 capsule by mouth daily. "10 Strands"   . sennosides-docusate sodium (SENOKOT-S) 8.6-50 MG tablet Take 1 tablet by mouth daily.   . sucralfate (CARAFATE) 1 g tablet TAKE (1) TABLET BY MOUTH THREE TIMES DAILY AS NEEDED. MAY BREAK UP AND MIX IN WATER.   . SYMBICORT 80-4.5 MCG/ACT inhaler INHALE 2 PUFFS INTO THE LUNGS TWICE DAILY.   [name] tiotropium (SPIRIVA HANDIHALER) 18 MCG inhalation capsule Place 1 capsule (18 mcg total) into inhaler and inhale daily.    Facility-Administered Encounter Medications as of 06/28/2019  Medication  . sodium chloride flush (NS) 0.9 % injection 10 mL     ALLERGIES:  No Known Allergies   PHYSICAL EXAM:  ECOG Performance status: 1  Vital signs deferred due to telephone visit  Physical Exam Physical assessment deferred due to telephone visit Patient was in no acute distress  LABORATORY DATA:  I have reviewed the labs as listed.  CBC    Component Value Date/Time   WBC 5.3 06/11/2019 1134   WBC 9.2 12/31/2018 0632   RBC 3.81 06/11/2019 1134   RBC 2.63 (L) 12/31/2018 0632   HGB 12.3 06/11/2019 1134   HCT 37.2 06/11/2019 1134   PLT 239 06/11/2019 1134   MCV 98 (H) 06/11/2019 1134   MCH 32.3 06/11/2019 1134   MCH 32.3 12/31/2018 0632   MCHC 33.1 06/11/2019 1134   MCHC 31.8 12/31/2018 0632   RDW 13.0 06/11/2019 1134   LYMPHSABS 1.3 06/11/2019 1134   MONOABS 0.8 12/31/2018 0632   EOSABS 0.1 06/11/2019 1134   BASOSABS 0.1 06/11/2019 1134   CMP Latest Ref Rng & Units 06/21/2019 12/30/2018 12/29/2018  Glucose 70 - 99 mg/dL - 120(H) 134(H)  BUN 8 - 23 mg/dL - 31(H) 33(H)  Creatinine 0.44 - 1.00 mg/dL 0.75 0.78 0.82  Sodium 135 - 145 mmol/L - 138 136  Potassium 3.5 - 5.1 mmol/L - 3.9 3.8  Chloride 98 - 111 mmol/L - 105 103  CO2 22 - 32 mmol/L - 25 25  Calcium 8.9 - 10.3 mg/dL - 8.7(L) 9.3  Total Protein 6.5 - 8.1 g/dL - - -  Total Bilirubin 0.3 - 1.2 mg/dL - - -  Alkaline Phos 38 - 126 U/L - - -  AST 15 - 41 U/L - - -  ALT 0 - 44 U/L - - -    All questions were answered to patient's stated satisfaction. Encouraged patient to call with any new concerns or questions before his next visit to the cancer center and we can certain see him sooner, if needed.     ASSESSMENT & PLAN:   Iron deficiency anemia due to chronic blood loss 1.  Iron deficiency anemia: - Etiology is multiple gastrointestinal  AVMs resulting in chronic blood loss. - Last Feraheme infusion was on 10/28/2018 and 11/05/2018. -She occasionally sees dark stools.  However denies any bright red bleeding per rectum. -Labs on 06/18/2019 showed her hemoglobin 12.4,  ferritin 246, percent saturation 60, vitamin B12 over 2000. -Son reports she started on oral iron supplementation which she is taking daily. -Patient reports she occasionally gets constipation alternating with diarrhea.  Her PCP has been following this and has ordered scans that showed no blockage. -She fell and broke her hip in May and had surgery Dec 28, 2018.  She is at home recovering. -I do not recommend any IV Feraheme at this time. -She will follow-up in 4 months with repeat labs.  She may have her labs [facility] at [facility].      Orders placed this encounter:  Orders Placed This Encounter  Procedures  . Lactate dehydrogenase  . CBC with Differential/Platelet  . Comprehensive metabolic panel  . Ferritin  . Iron and TIBC  . Vitamin B12  . VITAMIN D 25 Hydroxy (Vit-D Deficiency, Fractures)  . Folate      Francene Finders, FNP-C [place] [phone]

## 2019-06-29 ENCOUNTER — Telehealth: Payer: Self-pay | Admitting: Family Medicine

## 2019-06-29 NOTE — Telephone Encounter (Signed)
Please go ahead with verbal order as requested

## 2019-06-29 NOTE — Telephone Encounter (Signed)
Spoke with Verdis Frederickson and gave verbal orders for wound care and home health aide. Verdis Frederickson verbalized understanding.

## 2019-06-29 NOTE — Telephone Encounter (Signed)
[facility] for continued wound care on right heel and also home health aid for 2 x a week for 2 weeks then 1 x week for 1 week.  [phone]

## 2019-06-30 ENCOUNTER — Other Ambulatory Visit: Payer: Self-pay

## 2019-06-30 ENCOUNTER — Encounter: Payer: Self-pay | Admitting: Gastroenterology

## 2019-06-30 ENCOUNTER — Ambulatory Visit (INDEPENDENT_AMBULATORY_CARE_PROVIDER_SITE_OTHER): Payer: Medicare Other | Admitting: Gastroenterology

## 2019-06-30 VITALS — BP 108/60 | Temp 98.6°F

## 2019-06-30 DIAGNOSIS — K5900 Constipation, unspecified: Secondary | ICD-10-CM

## 2019-06-30 DIAGNOSIS — R109 Unspecified abdominal pain: Secondary | ICD-10-CM | POA: Diagnosis not present

## 2019-06-30 MED ORDER — PANTOPRAZOLE SODIUM 40 MG PO TBEC: 40.0000 mg | DELAYED_RELEASE_TABLET | Freq: Two times a day (BID) | ORAL | 2 refills | Status: DC

## 2019-06-30 NOTE — Patient Instructions (Addendum)
If you are age 83 or older, your body mass index should be between 23-30. Your There is no height or weight on file to calculate BMI. If this is out of the aforementioned range listed, please consider follow up with your Primary Care Provider.  If you are age 52 or younger, your body mass index should be between 19-25. Your There is no height or weight on file to calculate BMI. If this is out of the aformentioned range listed, please consider follow up with your Primary Care Provider.   DISCONTINUE Amitiza.  DISCONTINUE Iron pills.  Use prune juice/prunes daily for constipation.  We have sent the following medications to your pharmacy for you to pick up at your convenience: Pantoprazole 40 mg twice daily.  Call back in three weeks with an update.  Ask for Chong Sicilian, RN.  Thank you for choosing me and [facility].   Alonza Bogus, PA-C

## 2019-06-30 NOTE — Progress Notes (Signed)
06/30/2019 Carol Massey [id number] 08-Dec-1925   HISTORY OF PRESENT ILLNESS: This is a 85 year old female who is here today with her son at the request of her PCP, Dr. Wolfgang Phoenix, for evaluation regarding abdominal pain, constipation, nausea and vomiting.  Her son tells me that overall these issues started back in May after she fell and broke her hip and had a pin placed in her hip.  In June he states she had an episode where she had a bowel movement that was loose in had vomiting associated with that.  For the most part though she remains constipated and had been seeing Dr. Gala Romney and the PA at their facility for all of these symptoms.  They started her on Amitiza 8 mcg twice a day with food and that seemed to work for her constipation for a while.  She still remains on the Amitiza 8 mcg twice a day but that does not seem to be helping anymore and they have just been giving her prune juice, which she likes and it seems to produce a daily bowel movement.  They report that her stools are black, but she is also on ferrous sulfate 325 mg daily.  The son reports that his mother has also been complaining of left upper quadrant abdominal pain.  He says that she reports that is always hurting and then when someone touches on that area she begins vomiting/retching.  She has lots of belching and flatulence.  He tells me that she seems to be eating and drinking well for the most part and does not complain that eating or drinking worsens her pain.  She had a CT scan of the abdomen pelvis with contrast that showed the following   IMPRESSION: 1.  No acute findings in the abdomen/pelvis.  2. Nonobstructing left renal stone. Subcentimeter left renal cortical hypodensity over the upper pole too small to characterize but likely a cyst.  3.  Moderate size hiatal hernia.  4.  Aortic Atherosclerosis (ICD10-I70.0).  Dr. Henrene Pastor accepted the patient in transfer from Dr. Roseanne Kaufman office.  Past Medical History:   Diagnosis Date  . Anemia, chronic disease 01/21/2018  . Arthritis   . Asthma   . COPD (chronic obstructive pulmonary disease) ([place])   . Dieulafoy lesion of colon 2006  . Gastric AVM 2014  . Generalized anxiety disorder 07/24/2015  . Hard of hearing   . Hypertension   . Iron deficiency anemia due to chronic blood loss 05/27/2014  . Kidney stone   . Major depression in remission ([place]) 07/24/2015  . Osteoporosis   . PUD (peptic ulcer disease) 11/27/2015   Gastric and duodenal ulcer on EGD  . Skin cancer   . Status post dilation of esophageal narrowing   . Stroke ([place])   . Unsteady gait   . Upper GI bleed 11/27/2015   Past Surgical History:  Procedure Laterality Date  . ABDOMINAL HYSTERECTOMY    . APPENDECTOMY    . COLONOSCOPY  03/2005   Dr. Gala Romney: rectal and rectosigmoid polyps removed  . COLONOSCOPY WITH ESOPHAGOGASTRODUODENOSCOPY (EGD) N/A 08/12/2013   Dr. Gala Romney: colonoscopy with tubular adenoma, colonic diverticulosis. EGD with non-critical Schatzki's ring, non-manipulated, and gastric AVM with evidence of recent bleeding, s/p thermal sealing and hemostasis clip  . ESOPHAGEAL DILATION  11/27/2015   Procedure: ESOPHAGEAL DILATION;  Surgeon: Daneil Dolin, MD;  Location: [facility];  Service: Endoscopy;;  . ESOPHAGOGASTRODUODENOSCOPY  03/2005   Dr. Gala Romney: tiny distal esophageal erosions  . ESOPHAGOGASTRODUODENOSCOPY  N/A 11/27/2015   RMR: Cricopharyngeus web, status post dilation. Nonbleeding duodenal and gastric ulcers without stigmata of bleeding. Gastric biopsies benign  . ESOPHAGOGASTRODUODENOSCOPY N/A 02/14/2016   Procedure: ESOPHAGOGASTRODUODENOSCOPY (EGD);  Surgeon: Daneil Dolin, MD;  Location: [facility];  Service: Endoscopy;  Laterality: N/A;  300  . GIVENS CAPSULE STUDY N/A 11/27/2015   NOT DONE, WAS NOT INDICATED AT THE TIME  . INTRAMEDULLARY (IM) NAIL INTERTROCHANTERIC Right 12/28/2018   Procedure: INTRAMEDULLARY (IM) NAIL INTERTROCHANTRIC;  Surgeon: Paralee Cancel, MD;   Location: [facility];  Service: Orthopedics;  Laterality: Right;  . MALONEY DILATION N/A 02/14/2016   Procedure: Venia Minks DILATION;  Surgeon: Daneil Dolin, MD;  Location: [facility];  Service: Endoscopy;  Laterality: N/A;  . small bowel capsule study  03/2005   Dr. Laural Golden: two small Dieulafoy lesions at ascending colon. tiney specks of blood at duodenal bulb but no identified lesions  . TONSILLECTOMY      reports that she quit smoking about 4 years ago. Her smoking use included cigarettes. She has a 35.00 pack-year smoking history. She has never used smokeless tobacco. She reports that she does not drink alcohol or use drugs. family history includes Heart attack in her daughter; Hypertension in her daughter and mother; Leukemia (age of onset: 81) in her father; Lung cancer in her father; Melanoma in her son; Prostate cancer in her son; Squamous cell carcinoma in her son; Stroke in her mother; Tremor in her daughter. No Known Allergies    Outpatient Encounter Medications as of 06/30/2019  Medication Sig  . Acetaminophen (TYLENOL EXTRA STRENGTH PO) Take by mouth. prn  . albuterol (PROVENTIL HFA;VENTOLIN HFA) 108 (90 Base) MCG/ACT inhaler INHALE 2 PUFFS INTO THE LUNGS EVERY 4 HOURS AS NEEDED FOR WHEEZING OR SHORTNESS OF BREATH.  [name] ALPRAZolam (XANAX) 0.25 MG tablet 1/2 qam and 1 to 2 qhs prn sleep (Patient taking differently: Take 0.125 mg by mouth daily. 1/2 qam and 1 to 2 qhs prn sleep)  . amLODipine (NORVASC) 2.5 MG tablet Take 1 tablet (2.5 mg total) by mouth daily.  . citalopram (CELEXA) 10 MG tablet TAKE ONE TABLET BY MOUTH DAILY.  [name] Cyanocobalamin (VITAMIN B 12 PO) Take 1,000 mcg by mouth every other day.   . ferrous sulfate 325 (65 FE) MG tablet Take 1 tablet (325 mg total) by mouth 3 (three) times daily after meals for 14 days. (Patient taking differently: Take 325 mg by mouth daily. )  . lubiprostone (AMITIZA) 8 MCG capsule Take 1 capsule (8 mcg total) by mouth 2 (two) times daily with a  meal.  . ondansetron (ZOFRAN ODT) 4 MG disintegrating tablet Take 1 tablet (4 mg total) by mouth every 8 (eight) hours as needed for nausea or vomiting.  [name] OVER THE COUNTER MEDICATION Vit C one daily One a day multi vitamin Calcium 600mg  + D  Vit b12 1,000 mcg. One every other day  . pantoprazole (PROTONIX) 40 MG tablet TAKE ONE TABLET BY MOUTH DAILY FOR TREATMENT FOR THE ULCER.  [name] potassium chloride SA (K-DUR) 20 MEQ tablet Take 1 tablet (20 mEq total) by mouth daily.  . Probiotic Product (PROBIOTIC PO) Take 1 capsule by mouth daily. "10 Strands"  . sucralfate (CARAFATE) 1 g tablet TAKE (1) TABLET BY MOUTH THREE TIMES DAILY AS NEEDED. MAY BREAK UP AND MIX IN WATER.  . SYMBICORT 80-4.5 MCG/ACT inhaler INHALE 2 PUFFS INTO THE LUNGS TWICE DAILY.  [name] tiotropium (SPIRIVA HANDIHALER) 18 MCG inhalation capsule Place 1 capsule (  18 mcg total) into inhaler and inhale daily.  [name] oxyCODONE (OXY IR/ROXICODONE) 5 MG immediate release tablet 1  tablets 3 times daily as needed severe pain (Patient not taking: Reported on 06/21/2019)  . [DISCONTINUED] polyethylene glycol (MIRALAX) 17 g packet Take 17 g by mouth daily as needed.  . [DISCONTINUED] sennosides-docusate sodium (SENOKOT-S) 8.6-50 MG tablet Take 1 tablet by mouth daily.   Facility-Administered Encounter Medications as of 06/30/2019  Medication  . sodium chloride flush (NS) 0.9 % injection 10 mL     REVIEW OF SYSTEMS  : All other systems reviewed and negative except where noted in the History of Present Illness.   PHYSICAL EXAM: BP 108/60 (BP Location: Left Arm, Patient Position: Sitting, Cuff Size: Normal)   Temp 98.6 F (37 C)   LMP  (LMP Unknown)  General: Well developed white female in no acute distress; sitting in wheelchair Head: Normocephalic and atraumatic Eyes:  Sclerae anicteric, conjunctiva pink. Ears: Normal auditory acuity Lungs: Clear throughout to auscultation; no increased WOB. Heart: Regular rate and rhythm; no M/R/G.  Abdomen: Soft, non-distended.  BS present.  She expressed extreme tenderness when I barely placed my hands on her left upper quadrant, just somewhat brushing her skin.  She also started dry heaving. Musculoskeletal: Symmetrical with no gross deformities  Skin: No lesions on visible extremities Extremities: No edema  Neurological: Alert oriented x 4, grossly non-focal Psychological:  Alert and cooperative. Normal mood and affect  ASSESSMENT AND PLAN: 85 year old female with complaints of left upper quadrant abdominal pain associated with nausea/vomiting/dry heaves as well as constipation.  I barely place my hand on her abdomen, just brushing her skin in that area on exam today, and she jumped saying it was extremely painful and she started retching and dry heaving.  She just had a CT scan that was relatively unremarkable.  I am having her stop her Amitiza and iron supplements for now.  They are going to try to manage her constipation with prunes and prune juice as it seems to be working.  I am starting her on pantoprazole every day empirically for ulcer disease, etc., but I am not quite convinced that she has an intra-abdominal process causing this pain that induces nausea/dry heaves/vomiting.  We discussed endoscopy, but they are going to call back in 3 weeks with an update on her symptoms.  I advised them to certainly call back sooner if she seems to be worsening in the interim, however.  **I spent 45 minutes with the patient and her son, at least 50% of which was used to discuss options for evaluation, treatment, etc.   CC:  Luking, Elayne Snare, MD

## 2019-07-02 NOTE — Progress Notes (Signed)
Assessment and plan reviewed 

## 2019-07-07 ENCOUNTER — Telehealth: Payer: Self-pay | Admitting: Family Medicine

## 2019-07-07 ENCOUNTER — Other Ambulatory Visit: Payer: Self-pay | Admitting: *Deleted

## 2019-07-07 MED ORDER — SUCRALFATE 1 G PO TABS: ORAL_TABLET | ORAL | 5 refills | Status: DC

## 2019-07-07 NOTE — Telephone Encounter (Signed)
Pt is needing a refill on sucralfate (CARAFATE) 1 g tablet. Pt is requesting a 30 day supply GI doctor wants her to stay on this medication.   [place], [place]

## 2019-07-07 NOTE — Telephone Encounter (Signed)
Med sent to pharm. Pt's son mike notified.

## 2019-07-07 NOTE — Telephone Encounter (Signed)
Please advise. Thank you

## 2019-07-07 NOTE — Telephone Encounter (Signed)
1 4 times daily 30-day supply with 5 refills

## 2019-07-12 ENCOUNTER — Other Ambulatory Visit: Payer: Self-pay | Admitting: Family Medicine

## 2019-07-13 ENCOUNTER — Other Ambulatory Visit: Payer: Self-pay | Admitting: Family Medicine

## 2019-07-13 ENCOUNTER — Ambulatory Visit: Payer: Medicare Other | Admitting: Nurse Practitioner

## 2019-07-13 ENCOUNTER — Telehealth: Payer: Self-pay | Admitting: Family Medicine

## 2019-07-13 MED ORDER — AZITHROMYCIN 250 MG PO TABS: ORAL_TABLET | ORAL | 0 refills | Status: DC

## 2019-07-13 NOTE — Telephone Encounter (Signed)
Z-Pak was sent into the pharmacy [facility]

## 2019-07-13 NOTE — Telephone Encounter (Signed)
Verbal orders given to Clarise Cruz at home health.  Clarise Cruz stated the cough is dry and non productive and O2 was 94%on room air. She said she though she heard some rales in right lower lung and the son stated she normally needs a zpack when she gets like this -Please advise

## 2019-07-13 NOTE — Telephone Encounter (Signed)
[facility] called about patient who has a bad dry cough. She needs orders to see her at the end of the week. Also orders to see her once a week for seven weeks. [phone]

## 2019-07-13 NOTE — Telephone Encounter (Signed)
Son(DPR) notified 

## 2019-07-13 NOTE — Telephone Encounter (Signed)
May have verbal order for her to see the patient  If patient having fever or shortness of breath productive cough or progressively worse I need to know about that thank you

## 2019-07-15 ENCOUNTER — Telehealth: Payer: Self-pay | Admitting: Family Medicine

## 2019-07-15 DIAGNOSIS — M25551 Pain in right hip: Secondary | ICD-10-CM

## 2019-07-15 DIAGNOSIS — R1110 Vomiting, unspecified: Secondary | ICD-10-CM

## 2019-07-15 DIAGNOSIS — N289 Disorder of kidney and ureter, unspecified: Secondary | ICD-10-CM

## 2019-07-15 DIAGNOSIS — I10 Essential (primary) hypertension: Secondary | ICD-10-CM

## 2019-07-15 NOTE — Telephone Encounter (Signed)
That is fine with me see what we can do

## 2019-07-15 NOTE — Telephone Encounter (Signed)
Referral ordered in EPIC. 

## 2019-07-15 NOTE — Telephone Encounter (Signed)
Son wants to switch to [facility] health because he said [facility] is unpredictable with their hours, always running late, and there is a problem with giving her baths.

## 2019-07-16 ENCOUNTER — Telehealth: Payer: Self-pay | Admitting: *Deleted

## 2019-07-16 DIAGNOSIS — R197 Diarrhea, unspecified: Secondary | ICD-10-CM

## 2019-07-16 NOTE — Telephone Encounter (Signed)
Son(DPR) called and stated home health nurse recommended patient have stool test to check for blood in her stool. Consult with Dr Nicki Reaper- Stool test ordered as requested and packet up front as requested. Son verbalized understanding.

## 2019-07-19 ENCOUNTER — Telehealth: Payer: Self-pay

## 2019-07-19 NOTE — Telephone Encounter (Signed)
The pt's son was advised to called back with an update and states that the pt has alternating episodes of constipation and diarrhea.  He says that about once a week she has very loose stools that are very dark.  She has a lot of nausea when she has these episodes.  He also says that she has a constant tenderness on the upper left side of the abdomen.  Please advise

## 2019-07-21 ENCOUNTER — Other Ambulatory Visit: Payer: Self-pay | Admitting: *Deleted

## 2019-07-21 ENCOUNTER — Telehealth: Payer: Self-pay | Admitting: *Deleted

## 2019-07-21 DIAGNOSIS — R197 Diarrhea, unspecified: Secondary | ICD-10-CM

## 2019-07-21 LAB — IFOBT (OCCULT BLOOD): IFOBT: POSITIVE

## 2019-07-21 NOTE — Telephone Encounter (Signed)
I tried to call the pt's son-- no answer and no voice mail

## 2019-07-21 NOTE — Telephone Encounter (Signed)
hemossure test positive. See result in epic.   Results for orders placed or performed in visit on 07/21/19  hemosure  Result Value Ref Range   IFOBT Positive

## 2019-07-21 NOTE — Telephone Encounter (Signed)
I am still not sure what to make of her symptoms.  We could consider repeating CT scan although CT scan recently for the same symptoms was unremarkable.  Other option would be an EGD, which we discussed but were trying to avoid due to her advanced age.  If she eating?  Losing weight?  Thank you,  Jess

## 2019-07-27 ENCOUNTER — Telehealth: Payer: Self-pay | Admitting: Family Medicine

## 2019-07-27 ENCOUNTER — Other Ambulatory Visit (INDEPENDENT_AMBULATORY_CARE_PROVIDER_SITE_OTHER): Payer: Medicare Other | Admitting: *Deleted

## 2019-07-27 ENCOUNTER — Other Ambulatory Visit: Payer: Self-pay

## 2019-07-27 DIAGNOSIS — K921 Melena: Secondary | ICD-10-CM

## 2019-07-27 LAB — POCT HEMOGLOBIN: Hemoglobin: 12.8 g/dL (ref 11–14.6)

## 2019-07-27 NOTE — Telephone Encounter (Signed)
Please let the family know that we will send a copy of this to the gastroenterologist. It is very difficult to know if doing a endoscopy is warranted given an increased risk of having a procedure at her age Currently I think just checking a fingerstick hemoglobin would be the next step

## 2019-07-27 NOTE — Telephone Encounter (Signed)
Pt had car visit today to check hemoglobin. It was 12.8. consult with dr Nicki Reaper and he said it was very good and he would send a note to her GI. She sees Alonza Bogus PA at [facility] in [place]. Son was notified and he also wanted to let dr scott know she has had an odor to her stool off an on since this started but has started to have the odor every time now.

## 2019-07-27 NOTE — Telephone Encounter (Signed)
Also see result note from yesterday.

## 2019-07-27 NOTE — Telephone Encounter (Signed)
I have tried on several occasions to reach the pt with no response.  Letter mailed.  FYI Jess

## 2019-07-27 NOTE — Telephone Encounter (Signed)
That would be fine 

## 2019-07-27 NOTE — Telephone Encounter (Signed)
[facility] patient  Home health nurse noted bowel movement appeared darker on a visit requested stool test for blood IFBOT was positive We did do a fingerstick hemoglobin and it was normal at 12.8 Son states bowel movements at times foul odor but no blood or mucus that he can see  Patient saw you in November.  At this point-do you recommend any additional testing?  Do you recommend that they do a follow-up virtual visit with you or in person visit?  We can certainly do a follow-up visit mid December with repeat hemoglobin as well  I did not advise any type of endoscopy currently because of her age and increased risk of complications  I appreciate your input Sallee Lange primary care

## 2019-07-27 NOTE — Telephone Encounter (Signed)
I sent a message to the gastroenterology PA to get their input thank you

## 2019-07-27 NOTE — Telephone Encounter (Signed)
Discussed with pt's son [name] and hemoglobin checked today 12.8. see other message.

## 2019-07-27 NOTE — Telephone Encounter (Signed)
Pt's son would like to know if pt should come by office and have her blood level checked by a nurse since blood was in her stool.

## 2019-07-28 NOTE — Telephone Encounter (Signed)
That sounds great!  Thank you very much.  If they get back to [place] as well then I will be sure to keep you in the loop with any major changes or issues.  I think observation is the best route to go for now!  Thanks again,  [employer]

## 2019-07-28 NOTE — Telephone Encounter (Signed)
Still with some intermittent abdominal pain but not severe As you noted as well-with her recent CT scan being reassuring and not having frankly bloody stools I doubt there is any type of severe pathology going on-currently I am encouraging them to approach this from more of a watchful waiting symptomatic management point of view I will do a follow-up with them in December and let you know of anything different changes I am sure they would do a follow-up with your office virtual or in person down the road as well Thanks-Scott

## 2019-07-28 NOTE — Telephone Encounter (Signed)
Good morning!  Her son had called last week to give [place] an update on his mother symptoms as I had requested that he do.  I responded to my nurses message and she has tried again to contact him back several times without response.  Certainly if we can avoid endoscopy that would be best due to her advanced age and dementia as I know my attending would not be thrilled about scoping her.  Have they updated you on her abdominal pain?  That was a huge complaint/issue while she was here.  Thank you,  Alonza Bogus

## 2019-07-29 ENCOUNTER — Telehealth: Payer: Self-pay | Admitting: Gastroenterology

## 2019-07-29 NOTE — Telephone Encounter (Signed)
Pts son states pt is going to have a virtual visit with PCP next week but wanted to see if there was anything that GI could suggest until that visit. States he gave her MOM for constipation and she had a huge BM. Reports she has continued to have large BM's since Thanksgiving, so much he has to flush the toilet 3-4 times to keep if from clogging up. He is giving her 3-4 oz of prune juice with breakfast and has stopped the amitiza. Reports his mother is very nauseated right before having BM and while she is having the BM. She is taking the protonix and carafate. She is also still having  Pain above her stomach. Reports they also did stool cards and they were positive for bloods. Please advise.

## 2019-07-29 NOTE — Telephone Encounter (Signed)
Pt's son reported that pt has been experiencing nausea, abd pain and blood in stool.  Please advise.

## 2019-07-30 NOTE — Telephone Encounter (Signed)
I am aware of the stool cards.  I have been having conversation with Dr. Wolfgang Phoenix.  He is going to see her back sometime soon and then get back to me pending results from their visit to see if any further testing (and what testing) needs to be done.  Just continue what they are doing for now.  Thank you,  Jess

## 2019-07-30 NOTE — Telephone Encounter (Signed)
We will be doing a follow-up visit in the near future

## 2019-07-30 NOTE — Telephone Encounter (Signed)
Spoke with pts son and he is aware.

## 2019-08-04 ENCOUNTER — Ambulatory Visit (INDEPENDENT_AMBULATORY_CARE_PROVIDER_SITE_OTHER): Payer: Medicare Other | Admitting: Family Medicine

## 2019-08-04 ENCOUNTER — Other Ambulatory Visit: Payer: Self-pay

## 2019-08-04 DIAGNOSIS — R1013 Epigastric pain: Secondary | ICD-10-CM | POA: Diagnosis not present

## 2019-08-04 MED ORDER — DICYCLOMINE HCL 10 MG PO CAPS: ORAL_CAPSULE | ORAL | 0 refills | Status: DC

## 2019-08-04 NOTE — Progress Notes (Signed)
   Subjective:    Patient ID: Carol Massey, female    DOB: 1925-09-02, 85 y.o.   MRN: [id number]  HPI temp today 98.6 Pulse 63 O2 -95 bp has been 115 -120/62 -65 when nurse comes once a week.  Patient having intermittent spells of foul odor to her bowel movements most of the time they are dark but do not appear bloody or tarry.  No fevers or chills but does have occasional nausea and vomiting when trying to have a bowel movement.  Also has intermittent pain in the upper abdomen but had a CAT scan late October patient's appetite has been good.  Hemoglobin is been stable. pt with son Carol Massey.  States she is still having dark stools with an abnormal odor. Had 3 stools today. Not loose since right after thanksgiving. 2 days ago she thought she was going to vomit while trying to have a BM.     Review of Systems  Constitutional: Negative for activity change, appetite change and fatigue.  HENT: Negative for congestion and rhinorrhea.   Respiratory: Negative for cough and shortness of breath.   Cardiovascular: Negative for chest pain and leg swelling.  Gastrointestinal: Positive for abdominal pain and nausea. Negative for diarrhea and vomiting.  Endocrine: Negative for polydipsia and polyphagia.  Skin: Negative for color change.  Neurological: Negative for dizziness and weakness.  Psychiatric/Behavioral: Negative for behavioral problems and confusion.   Fall Risk  06/21/2019 05/27/2014 05/25/2014 11/30/2013 11/30/2013  Falls in the past year? 0 No No No No  Follow up Falls evaluation completed - - - -       Objective:   Physical Exam   Today's visit was via telephone Physical exam was not possible for this visit  Continue PPI    Assessment & Plan:  Intermittent abdominal pain Try dicyclomine 3 times daily as needed when necessary Watch bowel movements if they become bloody or tarry to notify us Follow-up car office visit next week to check fingerstick hemoglobin and recheck the patient Hold  off on any endoscopy or scopes currently Hold off on repeat CT scan Rationale discussed.  Certainly if her situation changes that could change our approach

## 2019-08-11 ENCOUNTER — Telehealth: Payer: Self-pay | Admitting: Family Medicine

## 2019-08-11 NOTE — Telephone Encounter (Signed)
Carol Massey has a little bit of a runny nose, pt was prescribed a zpack. Sinuses have been running on and off for about 5 months. Pt states no other symptoms. Provider instructed pt son to get tested; results are negative. Please advise. Thank you

## 2019-08-11 NOTE — Telephone Encounter (Signed)
Carol Massey has a car visit tomorrow and son Carol Massey had testing last week, with negative results. He said he just had a "head cold".  Wanted to make sure this was ok for them to still have a car visit tomorrow?

## 2019-08-11 NOTE — Telephone Encounter (Signed)
Pt son contacted and verbalized understanding. Pt son keeps blanket in car for patient at all times

## 2019-08-11 NOTE — Telephone Encounter (Signed)
I am fine with a car visit FYI it will be chilly tomorrow She may want a blanket for her legs when the car door is open for [place] to check her

## 2019-08-12 ENCOUNTER — Ambulatory Visit (INDEPENDENT_AMBULATORY_CARE_PROVIDER_SITE_OTHER): Payer: Medicare Other | Admitting: Family Medicine

## 2019-08-12 ENCOUNTER — Other Ambulatory Visit: Payer: Self-pay

## 2019-08-12 ENCOUNTER — Telehealth: Payer: Self-pay | Admitting: *Deleted

## 2019-08-12 VITALS — Temp 98.5°F

## 2019-08-12 DIAGNOSIS — K921 Melena: Secondary | ICD-10-CM

## 2019-08-12 DIAGNOSIS — R1012 Left upper quadrant pain: Secondary | ICD-10-CM | POA: Diagnosis not present

## 2019-08-12 LAB — POCT HEMOGLOBIN: Hemoglobin: 13.5 g/dL (ref 11–14.6)

## 2019-08-12 NOTE — Progress Notes (Signed)
Subjective:    Patient ID: Carol Massey, female    DOB: 1925-10-26, 85 y.o.   MRN: [id number]  HPIone week follow up on hemoglobin. This patient has had some intermittent abdominal pains worse in the left upper quadrant hits her about every day anywhere from 30 minutes to 60 minutes at a time several times per day sometimes bad enough to cause her to regurgitate other times is just intense pain stools have been dark sometimes black stool testing for blood was positive but have not seen any gross blood or hematuria or mucus appetite is good weight is been stable sleeps through the night has had consultation with gastroenterology they did not want to do endoscopy because of her age she is on PPI and Carafate.  Pt still having dark stools and a dry cough.  Results for orders placed or performed in visit on 08/12/19  POCT hemoglobin  Result Value Ref Range   Hemoglobin 13.5 11 - 14.6 g/dL       Review of Systems  Constitutional: Negative for activity change, appetite change and fatigue.  HENT: Negative for congestion and rhinorrhea.   Respiratory: Negative for cough and shortness of breath.   Cardiovascular: Negative for chest pain and leg swelling.  Gastrointestinal: Positive for abdominal pain, blood in stool, nausea and vomiting. Negative for abdominal distention, anal bleeding, constipation and diarrhea.  Endocrine: Negative for polydipsia and polyphagia.  Skin: Negative for color change.  Neurological: Negative for dizziness and weakness.  Psychiatric/Behavioral: Negative for behavioral problems and confusion.       Objective:   Physical Exam Vitals reviewed.  Constitutional:      General: She is not in acute distress. HENT:     Head: Normocephalic and atraumatic.  Eyes:     General:        Right eye: No discharge.        Left eye: No discharge.  Neck:     Trachea: No tracheal deviation.  Cardiovascular:     Rate and Rhythm: Normal rate and regular rhythm.     Heart  sounds: Normal heart sounds. No murmur.  Pulmonary:     Effort: Pulmonary effort is normal. No respiratory distress.     Breath sounds: Normal breath sounds.  Abdominal:     Palpations: There is no mass.     Tenderness: There is abdominal tenderness. There is no guarding.     Hernia: No hernia is present.  Lymphadenopathy:     Cervical: No cervical adenopathy.  Skin:    General: Skin is warm and dry.  Neurological:     Mental Status: She is alert.     Coordination: Coordination normal.  Psychiatric:        Behavior: Behavior normal.    Has tenderness left upper quadrant and epigastric region       Assessment & Plan:  Because of the significant ongoing abdominal pain as well as dark stools and heme positive stool we will go ahead and check IFBOT again as well as lab work plus also will go ahead and repeat CT scan of the abdomen because of the severity of her symptoms and how they are getting worse we need to rule out the possibility of developing a cancer or growth.  Patient is not a good candidate for endoscopy or colonoscopy currently would not want to do this unless absolutely have to.  Family agrees with this approach  25 minutes was spent with the patient.  This statement verifies  that 25 minutes was indeed spent with the patient.  More than 50% of this visit-total duration of the visit-was spent in counseling and coordination of care. The issues that the patient came in for today as reflected in the diagnosis (s) please refer to documentation for further details.

## 2019-08-12 NOTE — Telephone Encounter (Signed)
Dr Nicki Reaper ordered ct scan with contrast today and pt needs creatinine before the scan. He ordered met 7, liver, cbc, amylase to be done through [facility]. I called and gave lucky the verbal orders and she states they will go do bw on 12/21. Son Clemson notified. Message also sent to brendale so she would schedule after bw comes back.

## 2019-08-13 ENCOUNTER — Other Ambulatory Visit: Payer: Self-pay | Admitting: *Deleted

## 2019-08-13 DIAGNOSIS — K921 Melena: Secondary | ICD-10-CM

## 2019-08-13 LAB — IFOBT (OCCULT BLOOD): IFOBT: POSITIVE

## 2019-08-14 LAB — CLOSTRIDIUM DIFFICILE BY PCR: Toxigenic C. Difficile by PCR: NEGATIVE

## 2019-08-16 ENCOUNTER — Other Ambulatory Visit: Payer: Self-pay | Admitting: Family Medicine

## 2019-08-16 ENCOUNTER — Telehealth: Payer: Self-pay | Admitting: Family Medicine

## 2019-08-16 ENCOUNTER — Other Ambulatory Visit: Payer: Self-pay | Admitting: *Deleted

## 2019-08-16 DIAGNOSIS — R109 Unspecified abdominal pain: Secondary | ICD-10-CM

## 2019-08-16 NOTE — Telephone Encounter (Signed)
Orders that were given to home health for met 7, liver, cbc, and amylase put in at [facility]. Son states he will do tomorrow. Son also wanted to know if he should just bring her to the office every 2 weeks to have hemoglobin rechecked.

## 2019-08-16 NOTE — Telephone Encounter (Signed)
[facility] nurse came by today and was unable to draw blood. Stuck her 4 times and had no luck. They would like orders sent to [facility] so they can go tomorrow and have that drawn.

## 2019-08-17 LAB — STOOL CULTURE: E coli, Shiga toxin Assay: NEGATIVE

## 2019-08-17 NOTE — Telephone Encounter (Signed)
Discussed with pt's son mike and he verbalized understanding.

## 2019-08-17 NOTE — Telephone Encounter (Signed)
I would stay every 2 to 3 weeks for a nurse visit hemoglobin would be fine Ideally I would like to wait to see what recent lab work shows plus CAT scan

## 2019-08-18 LAB — CBC WITH DIFFERENTIAL/PLATELET
Basophils Absolute: 0.1 10*3/uL (ref 0.0–0.2)
Basos: 1 %
EOS (ABSOLUTE): 0.1 10*3/uL (ref 0.0–0.4)
Eos: 2 %
Hematocrit: 41.2 % (ref 34.0–46.6)
Hemoglobin: 13.2 g/dL (ref 11.1–15.9)
Immature Grans (Abs): 0 10*3/uL (ref 0.0–0.1)
Immature Granulocytes: 1 %
Lymphocytes Absolute: 1.2 10*3/uL (ref 0.7–3.1)
Lymphs: 20 %
MCH: 32.1 pg (ref 26.6–33.0)
MCHC: 32 g/dL (ref 31.5–35.7)
MCV: 100 fL — ABNORMAL HIGH (ref 79–97)
Monocytes Absolute: 0.5 10*3/uL (ref 0.1–0.9)
Monocytes: 8 %
Neutrophils Absolute: 3.9 10*3/uL (ref 1.4–7.0)
Neutrophils: 68 %
Platelets: 232 10*3/uL (ref 150–450)
RBC: 4.11 x10E6/uL (ref 3.77–5.28)
RDW: 11.6 % — ABNORMAL LOW (ref 11.7–15.4)
WBC: 5.7 10*3/uL (ref 3.4–10.8)

## 2019-08-18 LAB — BASIC METABOLIC PANEL
BUN/Creatinine Ratio: 12 (ref 12–28)
BUN: 10 mg/dL (ref 10–36)
CO2: 22 mmol/L (ref 20–29)
Calcium: 10.4 mg/dL — ABNORMAL HIGH (ref 8.7–10.3)
Chloride: 100 mmol/L (ref 96–106)
Creatinine, Ser: 0.84 mg/dL (ref 0.57–1.00)
GFR calc Af Amer: 70 mL/min/{1.73_m2} (ref >59)
GFR calc non Af Amer: 61 mL/min/{1.73_m2} (ref >59)
Glucose: 89 mg/dL (ref 65–99)
Potassium: 4.5 mmol/L (ref 3.5–5.2)
Sodium: 140 mmol/L (ref 134–144)

## 2019-08-18 LAB — HEPATIC FUNCTION PANEL
ALT: 16 IU/L (ref 0–32)
AST: 23 IU/L (ref 0–40)
Albumin: 4.4 g/dL (ref 3.5–4.6)
Alkaline Phosphatase: 86 IU/L (ref 39–117)
Bilirubin Total: 0.7 mg/dL (ref 0.0–1.2)
Bilirubin, Direct: 0.16 mg/dL (ref 0.00–0.40)
Total Protein: 6.3 g/dL (ref 6.0–8.5)

## 2019-08-18 LAB — AMYLASE: Amylase: 84 U/L (ref 31–110)

## 2019-08-19 ENCOUNTER — Other Ambulatory Visit: Payer: Self-pay | Admitting: Family Medicine

## 2019-08-19 ENCOUNTER — Telehealth: Payer: Self-pay | Admitting: Family Medicine

## 2019-08-19 MED ORDER — AZITHROMYCIN 250 MG PO TABS: ORAL_TABLET | ORAL | 0 refills | Status: DC

## 2019-08-19 NOTE — Telephone Encounter (Signed)
No fever, No SOB- dry cough- home health nurse said se had crackles in lungs

## 2019-08-19 NOTE — Telephone Encounter (Signed)
Labs done, scan scheduled, pt's son aware

## 2019-08-19 NOTE — Telephone Encounter (Signed)
Pt's home health nurse checked her yesterday evening & heard some crackling in her lungs  Son wonders if we should order a Zpak  Please advise & call pt     [facility]

## 2019-08-19 NOTE — Telephone Encounter (Signed)
Out of an abundance of caution we will send in Z-Pak along with recommendation that if she starts having high fevers difficulty breathing or worse to seek help The prescription was sent in thank you he should call [facility] I am not sure if they deliver on Christmas Eve

## 2019-08-19 NOTE — Telephone Encounter (Signed)
Son (DPR) notified and verbalized understanding. Son stated he will take patient to ER if SOB or high fevers.

## 2019-08-23 ENCOUNTER — Other Ambulatory Visit: Payer: Self-pay

## 2019-08-23 ENCOUNTER — Ambulatory Visit (HOSPITAL_COMMUNITY)
Admission: RE | Admit: 2019-08-23 | Discharge: 2019-08-23 | Disposition: A | Discharge status: Home / Self Care | Payer: Medicare Other | Source: Ambulatory Visit | Attending: Family Medicine | Admitting: Family Medicine

## 2019-08-23 DIAGNOSIS — R1012 Left upper quadrant pain: Secondary | ICD-10-CM

## 2019-08-23 DIAGNOSIS — K921 Melena: Secondary | ICD-10-CM

## 2019-08-23 MED ORDER — IOHEXOL 300 MG/ML  SOLN
75.0000 mL | Freq: Once | INTRAMUSCULAR | Status: AC | PRN
  Administered 2019-08-23: 17:00:00 75 mL via INTRAVENOUS

## 2019-08-25 ENCOUNTER — Telehealth: Payer: Self-pay | Admitting: Family Medicine

## 2019-08-25 NOTE — Telephone Encounter (Signed)
Patient's son(Mike) would to know results of CAT scan that was done.

## 2019-08-25 NOTE — Telephone Encounter (Signed)
I do think it is reasonable to do follow-up with gastroenterology but given no sign of tumors or growths I would not be in favor of endoscopy unless having significant loss of blood In other words the risk of endoscopy outweighs the benefits at this point  I would recommend arranging for a car visit/nurse visit hemoglobin every 3 to 4 weeks With office visit with me at least every 3 months Sooner if any problems

## 2019-08-25 NOTE — Telephone Encounter (Signed)
No tumors no growths no masses no anything that would be an explanation for the pain which is good news, there is an old normal blood vessel structure in there liver which has been there on previous scans and stable

## 2019-08-25 NOTE — Telephone Encounter (Signed)
Results discussed with son(DPR) Son advised per Dr Richardson Landry : No tumors no growths no masses no anything that would be an explanation for the pain which is good news, there is an old normal blood vessel structure in there liver which has been there on previous scans and stable. Son verbalized understanding.  Son wanted to know if there is anything else they need to do with her stool test positive-? Back to GI ?

## 2019-08-26 NOTE — Telephone Encounter (Signed)
Pt son contacted and verbalized understanding. Son states he will call GI doctor today and set up an appt; pt also transferred up front to set up nurse visit

## 2019-08-30 ENCOUNTER — Telehealth: Payer: Self-pay | Admitting: Family Medicine

## 2019-08-30 NOTE — Telephone Encounter (Signed)
Pt is needing refill on citalopram (CELEXA) 10 MG tablet   [place], [place] [facility] [place]

## 2019-08-31 ENCOUNTER — Other Ambulatory Visit: Payer: Self-pay

## 2019-08-31 ENCOUNTER — Telehealth: Payer: Self-pay | Admitting: Family Medicine

## 2019-08-31 ENCOUNTER — Other Ambulatory Visit (INDEPENDENT_AMBULATORY_CARE_PROVIDER_SITE_OTHER): Payer: Medicare Other | Admitting: Family Medicine

## 2019-08-31 DIAGNOSIS — D509 Iron deficiency anemia, unspecified: Secondary | ICD-10-CM

## 2019-08-31 DIAGNOSIS — R05 Cough: Secondary | ICD-10-CM

## 2019-08-31 DIAGNOSIS — R059 Cough, unspecified: Secondary | ICD-10-CM

## 2019-08-31 LAB — POCT HEMOGLOBIN: Hemoglobin: 12.7 g/dL (ref 11–14.6)

## 2019-08-31 MED ORDER — CITALOPRAM HYDROBROMIDE 10 MG PO TABS: ORAL_TABLET | ORAL | 1 refills | Status: DC

## 2019-08-31 NOTE — Telephone Encounter (Signed)
Son states that [place] was suppose to be sending over a form to have completed so services could be recertified with insurance. Son states that aide was suppose to come out to home this morning but did not. Son states that [facility] has dropped the ball quite a few times when previously trying to get recertified. Contacted [facility] and spoke with Bridgette. The LPN is scheduled to come out tomorrow and the aide is scheduled to come out Thursday and Friday. Per Bridgette, pt was recertified on [id number]. Contacted patient son and pt son verbalized understanding.

## 2019-08-31 NOTE — Telephone Encounter (Signed)
Medication sent in and pt son is aware

## 2019-08-31 NOTE — Telephone Encounter (Signed)
6 refills °

## 2019-08-31 NOTE — Progress Notes (Signed)
   Subjective:    Patient ID: Carol Massey, female    DOB: 07-25-1926, 86 y.o.   MRN: [id number]  HPI Patient seen today for hemoglobin check Stool test positive for blood recently Had recent CT scan which did not show any type of tumor but did show some hiatal hernia Patient also had some crackles in her lungs according to the home health nurse family would like for [place] to listen to her lungs No fevers has some intermittent coughing.  Has history of smoking.  PMH benign. Still has some left-sided abdominal pain hurts to palpate and occasionally has regurgitation with bowel movements CAT scan did show a hiatal hernia Review of Systems  Constitutional: Negative for activity change and fever.  HENT: Negative for congestion, ear pain and rhinorrhea.   Eyes: Negative for discharge.  Respiratory: Positive for cough. Negative for shortness of breath and wheezing.   Cardiovascular: Negative for chest pain.  Gastrointestinal: Positive for abdominal pain and nausea. Negative for constipation and diarrhea.       Objective:   Physical Exam Vitals reviewed.  Constitutional:      General: She is not in acute distress. HENT:     Head: Normocephalic.  Cardiovascular:     Rate and Rhythm: Normal rate and regular rhythm.     Heart sounds: Normal heart sounds. No murmur.  Pulmonary:     Effort: Pulmonary effort is normal.     Breath sounds: Normal breath sounds.  Abdominal:     General: There is no distension.     Palpations: Abdomen is soft. There is no mass.     Tenderness: There is abdominal tenderness.     Hernia: No hernia is present.  Lymphadenopathy:     Cervical: No cervical adenopathy.  Neurological:     Mental Status: She is alert.  Psychiatric:        Behavior: Behavior normal.   Minimal crackles in the base consistent with atelectasis no rales no rhonchi Patient does have some tenderness in the left upper abdomen no guarding or rebound      Assessment & Plan:  I do not  feel the patient needs to be on any type of antibiotics at this point Intermittent abdominal pain I believe is more functional Certainly has positive stool test for blood but her hemoglobin is stable.  Has follow-up with gastroenterology later in January Currently if hemoglobin stays consistently good I would not recommend scoping at this point the risk outweighs benefit Certainly this can change based upon what gastroenterology has to say. It is possible the regurgitation with bowel movements is related to the hiatal hernia. Patient will do a follow-up hemoglobin in several weeks

## 2019-09-01 ENCOUNTER — Telehealth: Payer: Self-pay | Admitting: Family Medicine

## 2019-09-01 NOTE — Telephone Encounter (Signed)
[facility]) contacted and gave verbal orders

## 2019-09-01 NOTE — Telephone Encounter (Signed)
[facility]) called needing orders for patient to continue skilled nursing care for once a week for six weeks and home health aid to help with bathing twice a week for six weeks. [phone]

## 2019-09-01 NOTE — Telephone Encounter (Signed)
Please give verbal orders for this thank you

## 2019-09-01 NOTE — Telephone Encounter (Signed)
Please advise. Thank you

## 2019-09-08 ENCOUNTER — Other Ambulatory Visit: Payer: Self-pay | Admitting: Gastroenterology

## 2019-09-13 DIAGNOSIS — L89610 Pressure ulcer of right heel, unstageable: Secondary | ICD-10-CM | POA: Diagnosis not present

## 2019-09-13 DIAGNOSIS — J449 Chronic obstructive pulmonary disease, unspecified: Secondary | ICD-10-CM | POA: Diagnosis not present

## 2019-09-20 ENCOUNTER — Other Ambulatory Visit: Payer: Self-pay | Admitting: Family Medicine

## 2019-09-20 DIAGNOSIS — J449 Chronic obstructive pulmonary disease, unspecified: Secondary | ICD-10-CM | POA: Diagnosis not present

## 2019-09-20 DIAGNOSIS — D500 Iron deficiency anemia secondary to blood loss (chronic): Secondary | ICD-10-CM | POA: Diagnosis not present

## 2019-09-20 DIAGNOSIS — I10 Essential (primary) hypertension: Secondary | ICD-10-CM | POA: Diagnosis not present

## 2019-09-21 ENCOUNTER — Other Ambulatory Visit (INDEPENDENT_AMBULATORY_CARE_PROVIDER_SITE_OTHER): Payer: Medicare Other

## 2019-09-21 ENCOUNTER — Other Ambulatory Visit: Payer: Self-pay

## 2019-09-21 DIAGNOSIS — D509 Iron deficiency anemia, unspecified: Secondary | ICD-10-CM | POA: Diagnosis not present

## 2019-09-21 LAB — POCT HEMOGLOBIN: Hemoglobin: 12.3 g/dL (ref 11–14.6)

## 2019-09-22 ENCOUNTER — Other Ambulatory Visit: Payer: Self-pay

## 2019-09-22 ENCOUNTER — Ambulatory Visit: Payer: Medicare Other | Admitting: Gastroenterology

## 2019-09-22 ENCOUNTER — Encounter: Payer: Self-pay | Admitting: Gastroenterology

## 2019-09-22 VITALS — BP 106/68 | HR 66 | Temp 97.6°F

## 2019-09-22 DIAGNOSIS — K5900 Constipation, unspecified: Secondary | ICD-10-CM

## 2019-09-22 DIAGNOSIS — R1012 Left upper quadrant pain: Secondary | ICD-10-CM | POA: Diagnosis not present

## 2019-09-22 DIAGNOSIS — R142 Eructation: Secondary | ICD-10-CM | POA: Insufficient documentation

## 2019-09-22 DIAGNOSIS — R110 Nausea: Secondary | ICD-10-CM | POA: Insufficient documentation

## 2019-09-22 NOTE — Progress Notes (Signed)
09/22/2019 Carol Massey [id number] 01/24/1926   HISTORY OF PRESENT ILLNESS: This is a 86 year old female who was seen by me initially on June 30, 2019 for evaluation regarding complaints of nausea, left upper quadrant abdominal pain, constipation.  Referred here by her PCP, Dr. Wolfgang Phoenix, for follow-up.  Please see my note from that date for further details.  Her son is again with her today.  He says that they have discontinued the Amitiza and have been using prune juice regularly to manage her constipation and that seems to be working well.  She continues to complain of nausea, mostly in the mornings or related to when she has a large bowel movement.  She complains of a lot of belching.  She still has left upper quadrant abdominal pain, although somewhat less severe than previously.  It seems to bother her more if she has to strain at all to move her bowels.  She vividly states that this began back in May when she fell and had to have orthopedic surgery and was very constipated.  She remembers straining and acutely having this abdominal pain that was very severe at that time.  Since I saw her last she had another CT scan of the abdomen pelvis with contrast that was also unremarkable (incidental findings including a moderate hiatal hernia, etc, but no definite issues to explain her symptoms).  This makes 2 CT scans since the end of October.  Lab studies are also unremarkable.  He reports that she still continues to have black stools frequently, but not necessarily every bowel movement.  She did have a positive Hemosure study previously.  Hemoglobin has continued to remain normal in the 12 g range.   Past Medical History:  Diagnosis Date  . Anemia, chronic disease 01/21/2018  . Arthritis   . Asthma   . COPD (chronic obstructive pulmonary disease) ([place])   . Dieulafoy lesion of colon 2006  . Gastric AVM 2014  . Generalized anxiety disorder 07/24/2015  . Hard of hearing   . Hypertension   . Iron  deficiency anemia due to chronic blood loss 05/27/2014  . Kidney stone   . Major depression in remission ([place]) 07/24/2015  . Osteoporosis   . PUD (peptic ulcer disease) 11/27/2015   Gastric and duodenal ulcer on EGD  . Skin cancer   . Status post dilation of esophageal narrowing   . Stroke ([place])   . Unsteady gait   . Upper GI bleed 11/27/2015   Past Surgical History:  Procedure Laterality Date  . ABDOMINAL HYSTERECTOMY    . APPENDECTOMY    . COLONOSCOPY  03/2005   Dr. Gala Romney: rectal and rectosigmoid polyps removed  . COLONOSCOPY WITH ESOPHAGOGASTRODUODENOSCOPY (EGD) N/A 08/12/2013   Dr. Gala Romney: colonoscopy with tubular adenoma, colonic diverticulosis. EGD with non-critical Schatzki's ring, non-manipulated, and gastric AVM with evidence of recent bleeding, s/p thermal sealing and hemostasis clip  . ESOPHAGEAL DILATION  11/27/2015   Procedure: ESOPHAGEAL DILATION;  Surgeon: Daneil Dolin, MD;  Location: [facility];  Service: Endoscopy;;  . ESOPHAGOGASTRODUODENOSCOPY  03/2005   Dr. Gala Romney: tiny distal esophageal erosions  . ESOPHAGOGASTRODUODENOSCOPY N/A 11/27/2015   RMR: Cricopharyngeus web, status post dilation. Nonbleeding duodenal and gastric ulcers without stigmata of bleeding. Gastric biopsies benign  . ESOPHAGOGASTRODUODENOSCOPY N/A 02/14/2016   Procedure: ESOPHAGOGASTRODUODENOSCOPY (EGD);  Surgeon: Daneil Dolin, MD;  Location: [facility];  Service: Endoscopy;  Laterality: N/A;  300  . GIVENS CAPSULE STUDY N/A 11/27/2015   NOT  DONE, WAS NOT INDICATED AT THE TIME  . INTRAMEDULLARY (IM) NAIL INTERTROCHANTERIC Right 12/28/2018   Procedure: INTRAMEDULLARY (IM) NAIL INTERTROCHANTRIC;  Surgeon: Paralee Cancel, MD;  Location: [facility];  Service: Orthopedics;  Laterality: Right;  . MALONEY DILATION N/A 02/14/2016   Procedure: Venia Minks DILATION;  Surgeon: Daneil Dolin, MD;  Location: [facility];  Service: Endoscopy;  Laterality: N/A;  . small bowel capsule study  03/2005   Dr. Laural Golden: two small  Dieulafoy lesions at ascending colon. tiney specks of blood at duodenal bulb but no identified lesions  . TONSILLECTOMY      reports that she quit smoking about 5 years ago. Her smoking use included cigarettes. She has a 35.00 pack-year smoking history. She has never used smokeless tobacco. She reports that she does not drink alcohol or use drugs. family history includes Heart attack in her daughter; Hypertension in her daughter and mother; Leukemia (age of onset: 76) in her father; Lung cancer in her father; Melanoma in her son; Prostate cancer in her son; Squamous cell carcinoma in her son; Stroke in her mother; Tremor in her daughter. No Known Allergies    Outpatient Encounter Medications as of 09/22/2019  Medication Sig  . Acetaminophen (TYLENOL EXTRA STRENGTH PO) Take by mouth. prn  . albuterol (PROVENTIL HFA;VENTOLIN HFA) 108 (90 Base) MCG/ACT inhaler INHALE 2 PUFFS INTO THE LUNGS EVERY 4 HOURS AS NEEDED FOR WHEEZING OR SHORTNESS OF BREATH.  [name] ALPRAZolam (XANAX) 0.25 MG tablet TAKE 1/2 TABLET BY MOUTH IN THE MORNING AND 1 TO 2 TABLETS AT BEDTIME AS NEEDED FOR SLEEP.  [name] amLODipine (NORVASC) 2.5 MG tablet TAKE 1 TABLET BY MOUTH ONCE A DAY.  . Calcium Carbonate Antacid (TUMS PO) Take by mouth. 3 -4 a day  . citalopram (CELEXA) 10 MG tablet TAKE ONE TABLET BY MOUTH DAILY.  [name] Cyanocobalamin (VITAMIN B 12 PO) Take 1,000 mcg by mouth every other day.   . dicyclomine (BENTYL) 10 MG capsule Take one tid prn for abdominal cramps  . ondansetron (ZOFRAN ODT) 4 MG disintegrating tablet Take 1 tablet (4 mg total) by mouth every 8 (eight) hours as needed for nausea or vomiting.  [name] OVER THE COUNTER MEDICATION Vit C one daily One a day multi vitamin Calcium 600mg  + D  Vit b12 1,000 mcg. One every other day  . oxyCODONE (OXY IR/ROXICODONE) 5 MG immediate release tablet 1  tablets 3 times daily as needed severe pain  . pantoprazole (PROTONIX) 40 MG tablet Take 1 tablet (40 mg total) by mouth 2 (two) times  daily. Please keep your appt in January for any further refills.  . potassium chloride SA (K-DUR) 20 MEQ tablet Take 1 tablet (20 mEq total) by mouth daily.  . Probiotic Product (PROBIOTIC PO) Take 1 capsule by mouth daily. "10 Strands"  . SPIRIVA HANDIHALER 18 MCG inhalation capsule INHALE 1 PUFF INTO THE LUNGS DAILY  . sucralfate (CARAFATE) 1 g tablet TAKE ONE QID  . SYMBICORT 80-4.5 MCG/ACT inhaler INHALE 2 PUFFS INTO THE LUNGS TWICE DAILY.  . [DISCONTINUED] azithromycin (ZITHROMAX Z-PAK) 250 MG tablet Take 2 tablets (500 mg) on  Day 1,  followed by 1 tablet (250 mg) once daily on Days 2 through 5.  . [DISCONTINUED] lubiprostone (AMITIZA) 8 MCG capsule Take 1 capsule (8 mcg total) by mouth 2 (two) times daily with a meal.   Facility-Administered Encounter Medications as of 09/22/2019  Medication  . sodium chloride flush (NS) 0.9 % injection 10 mL  REVIEW OF SYSTEMS  : All other systems reviewed and negative except where noted in the History of Present Illness.   PHYSICAL EXAM: BP 106/68   Pulse 66   Temp 97.6 F (36.4 C)   LMP  (LMP Unknown)  General:  Frail white female in NAD; in wheelchair Head: Normocephalic and atraumatic Eyes:  Sclerae anicteric, conjunctiva pink. Ears: Normal auditory acuity Lungs: Clear throughout to auscultation; no increased WOB. Heart: Regular rate and rhythm; no M/R/G. Abdomen: Soft, non-distended.  BS present.  TTP in LUQ superficially. Musculoskeletal: Symmetrical with no gross deformities  Skin: No lesions on visible extremities Extremities: No edema  Neurological: Alert oriented x 4, grossly non-focal Psychological:  Alert and cooperative. Normal mood and affect  ASSESSMENT AND PLAN: 86 year old female with complaints of left upper quadrant abdominal pain with nausea as well as constipation.  Her constipation is being managed well with prune juice on a regular basis, she has discontinued the Amitiza.  Still continues to complain of left  upper quadrant abdominal pain that seems quite superficial, but not quite as severe as previously.  I think that this actually may be an abdominal wall strain of some sort related to an episode of constipation and straining back in May when she noticed that this pain began.  I think that her nausea and belching may be related to hiatal hernia.  She is on pantoprazole 40 mg twice daily as well as Carafate tablets 4 times a day.  Her son reports black stools and she does have Hemosure test that was positive, but her hemoglobin has been completely normal.  Question if the dark stools is related to the use of the prune juice.  Nonetheless, I am very little reluctant to do any procedures on this patient due to her advanced age and her frail status.  She has now had 2 CT scans and lab studies that have been unremarkable so I do not think that we are missing anything significant.  I have recommended they continue with her current regimen and if we were going to proceed with any other evaluation of the upper GI tract rule out any large nonhealing type ulcer, gastric mass that may not have been picked up by CT scan, etc. then we would proceed with an upper GI series.  Her son would like to hold off for now, but will call us back after their follow-up oncology appointment in March.  **Her son is in agreement with this plan and would also like to try to avoid procedures if possible.      CC:  [name], MD

## 2019-09-22 NOTE — Patient Instructions (Signed)
Please call the office with an update of your symptoms after you see oncology ask for Koren Shiver, RN, BSN.

## 2019-09-23 NOTE — Progress Notes (Signed)
Assessment and plan reviewed.  Agree with conservative approach moving forward

## 2019-09-28 ENCOUNTER — Telehealth: Payer: Self-pay | Admitting: Family Medicine

## 2019-09-28 NOTE — Telephone Encounter (Signed)
[facility]) needing verbal order to dress a skin tare that patient has. [phone]

## 2019-09-28 NOTE — Telephone Encounter (Signed)
Verbal order left on Carol Massey's voicemail.

## 2019-09-28 NOTE — Telephone Encounter (Signed)
Please advise. Thank you

## 2019-09-28 NOTE — Telephone Encounter (Signed)
Please give verbal order they may go ahead and treat her skin tear thank you

## 2019-10-04 ENCOUNTER — Telehealth: Payer: Self-pay | Admitting: Gastroenterology

## 2019-10-04 DIAGNOSIS — R197 Diarrhea, unspecified: Secondary | ICD-10-CM

## 2019-10-04 NOTE — Telephone Encounter (Signed)
Called pt son and no answer and no voice mail

## 2019-10-05 NOTE — Telephone Encounter (Signed)
  Carol Massey the pt's son is calling to state the pt had "massive diarrhea" 4-5 times daily.  He says it is very odorous.  Today was more normal but "smells very bad".  She had abd pain prior to the diarrhea episode and after the diarrhea the pain resolved.  Per the last note Upper GI series next step.  The pt son would like to know if they should proceed now or wait?  "I have recommended they continue with her current regimen and if we were going to proceed with any other evaluation of the upper GI tract rule out any large nonhealing type ulcer, gastric mass that may not have been picked up by CT scan, etc. then we would proceed with an upper GI series.  Her son would like to hold off for now, but will call us back after their follow-up oncology appointment in March."

## 2019-10-06 NOTE — Telephone Encounter (Signed)
The pt son has bene advised and states that the pt is not taking any laxatives not even prune juice.  He will come in and pick up the stool kit this week or Monday.  The order has been added.

## 2019-10-06 NOTE — Telephone Encounter (Signed)
The UGI series would evaluate her upper GI tract and would not have anything to do with her diarrhea or bowel issues.  We could order a Cdiff study if she is continuing with diarrhea.  Please be sure she is not currently on any type of bowel regimen/laxatives other than the prune juice that they have been using prior to ordering that stool study if they desire.  Thank you,  Jess

## 2019-10-11 ENCOUNTER — Other Ambulatory Visit: Payer: Self-pay | Admitting: Family Medicine

## 2019-10-12 ENCOUNTER — Telehealth: Payer: Self-pay | Admitting: Family Medicine

## 2019-10-12 MED ORDER — PANTOPRAZOLE SODIUM 40 MG PO TBEC: 40.0000 mg | DELAYED_RELEASE_TABLET | Freq: Two times a day (BID) | ORAL | 2 refills | Status: DC

## 2019-10-12 NOTE — Telephone Encounter (Signed)
Pt is needing refill on pantoprazole (PROTONIX) 40 MG tablet Son is hoping this can be sent in today. He didn't realize she was out.   [employer].   Also [facility] ordered a lab and wanted him to drive to [place] to pick up the kit get a stool sample then bring it back there and drop off. He is wanting to know if Dr. Nicki Reaper would order the lab and provide kit for [facility] in [place]. So he doesn't have to drive his mom to and from [place] twice.

## 2019-10-12 NOTE — Telephone Encounter (Signed)
Prescription sent electronically to pharmacy. Son(DPR) notified. Son to pick up [facility] order from [facility] was sent.

## 2019-10-12 NOTE — Telephone Encounter (Signed)
May have pantoprazole refill with 2 additional refills  Also please provide C. difficile containers the patient should be able to take that directly to [facility] because the test was ordered through [facility] (please explain to the son once he collects this he could bring it to [facility] on the second floor across from the hospital and they should be able to find the order for the test within their electronic system, it is very important for the son to labeled container with his mom's name date of birth and the date of the collection of the specimen)

## 2019-10-14 ENCOUNTER — Other Ambulatory Visit: Payer: Medicare Other

## 2019-10-15 ENCOUNTER — Other Ambulatory Visit: Payer: Medicare Other

## 2019-10-15 ENCOUNTER — Other Ambulatory Visit: Payer: Self-pay

## 2019-10-15 ENCOUNTER — Other Ambulatory Visit (INDEPENDENT_AMBULATORY_CARE_PROVIDER_SITE_OTHER): Payer: Medicare Other

## 2019-10-15 DIAGNOSIS — D509 Iron deficiency anemia, unspecified: Secondary | ICD-10-CM | POA: Diagnosis not present

## 2019-10-15 LAB — POCT HEMOGLOBIN: Hemoglobin: 12 g/dL (ref 11–14.6)

## 2019-10-25 ENCOUNTER — Telehealth: Payer: Self-pay | Admitting: Family Medicine

## 2019-10-25 NOTE — Telephone Encounter (Signed)
Verbal order given to Judson Roch at [facility].

## 2019-10-25 NOTE — Telephone Encounter (Signed)
May have verbal orders for this thank you °

## 2019-10-25 NOTE — Telephone Encounter (Signed)
Carol Massey with [facility] calling requesting recert on orders for wound care.   1 x 9 weeks   CB# [id number]

## 2019-10-29 ENCOUNTER — Other Ambulatory Visit (HOSPITAL_COMMUNITY): Payer: Self-pay | Admitting: Nurse Practitioner

## 2019-10-29 ENCOUNTER — Other Ambulatory Visit (HOSPITAL_COMMUNITY): Payer: Self-pay

## 2019-11-01 ENCOUNTER — Encounter: Payer: Self-pay | Admitting: Hematology

## 2019-11-01 ENCOUNTER — Inpatient Hospital Stay (HOSPITAL_COMMUNITY): Payer: Medicare Other

## 2019-11-04 DIAGNOSIS — J449 Chronic obstructive pulmonary disease, unspecified: Secondary | ICD-10-CM | POA: Diagnosis not present

## 2019-11-04 DIAGNOSIS — L89610 Pressure ulcer of right heel, unstageable: Secondary | ICD-10-CM | POA: Diagnosis not present

## 2019-11-04 DIAGNOSIS — M810 Age-related osteoporosis without current pathological fracture: Secondary | ICD-10-CM | POA: Diagnosis not present

## 2019-11-08 ENCOUNTER — Ambulatory Visit (HOSPITAL_COMMUNITY): Payer: Medicare Other | Admitting: Nurse Practitioner

## 2019-11-09 ENCOUNTER — Other Ambulatory Visit: Payer: Self-pay | Admitting: Family Medicine

## 2019-11-10 ENCOUNTER — Inpatient Hospital Stay (HOSPITAL_COMMUNITY): Payer: Medicare Other | Attending: Nurse Practitioner | Admitting: Nurse Practitioner

## 2019-11-10 ENCOUNTER — Telehealth: Payer: Self-pay | Admitting: Gastroenterology

## 2019-11-10 DIAGNOSIS — D500 Iron deficiency anemia secondary to blood loss (chronic): Secondary | ICD-10-CM | POA: Diagnosis not present

## 2019-11-10 NOTE — Telephone Encounter (Signed)
Patients son calling- patient had blood work drawn and son states that Janett Billow wanted to know when that blood work was done so she could look over it. Patients son said that the lady at the cancer center at [facility] said that we will have to call [facility] to get the results faxed over to [place].

## 2019-11-10 NOTE — Assessment & Plan Note (Signed)
1.  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 - Last Feraheme infusion was on 10/28/2018 and 11/05/2018. -She occasionally sees dark stools.  However denies any bright red bleeding per rectum. -Son reports she started on oral iron supplementation which she is taking daily. -Patient reports she occasionally gets constipation alternating with diarrhea.  Her PCP has been following this and has ordered scans that showed no blockage. -She fell and broke her hip in May and had surgery Dec 28, 2018.  She is at home recovering. -Labs done on 11/01/2019 showed hemoglobin 12.8, ferritin 86, percent saturation 33 -I do not recommend any IV Feraheme at this time. -She will follow-up in 4 months with repeat labs.  She may have her labs done at [facility].

## 2019-11-10 NOTE — Telephone Encounter (Signed)
Carol Massey the labs will be faxed to you for review.

## 2019-11-10 NOTE — Progress Notes (Signed)
[place], [place] [address]   CLINIC:  Medical Oncology/Hematology  PCP:  [name], MD [address] [place] [address] [phone]   REASON FOR VISIT: Follow-up for iron deficiency anemia  CURRENT THERAPY: Intermittent iron infusions   INTERVAL HISTORY:  Carol Massey 86 y.o. female was called for a follow-up for iron deficiency anemia.  Spoke with the patient's son today due to patient not being able to hear well.  He reports that she is her normal self.  She has no complaints.  He does report occasional dark stools.  Denies any bright red bleeding. Denies any nausea, vomiting, or diarrhea. Denies any new pains. Had not noticed any recent bleeding such as epistaxis, hematuria or hematochezia. Denies recent chest pain on exertion, shortness of breath on minimal exertion, pre-syncopal episodes, or palpitations. Denies any numbness or tingling in hands or feet. Denies any recent fevers, infections, or recent hospitalizations. Patient reports appetite at 75% and energy level at 50%.     REVIEW OF SYSTEMS:  Review of Systems  All other systems reviewed and are negative.    PAST MEDICAL/SURGICAL HISTORY:  Past Medical History:  Diagnosis Date  . Anemia, chronic disease 01/21/2018  . Arthritis   . Asthma   . COPD (chronic obstructive pulmonary disease) ([place])   . Dieulafoy lesion of colon 2006  . Gastric AVM 2014  . Generalized anxiety disorder 07/24/2015  . Hard of hearing   . Hypertension   . Iron deficiency anemia due to chronic blood loss 05/27/2014  . Kidney stone   . Major depression in remission ([place]) 07/24/2015  . Osteoporosis   . PUD (peptic ulcer disease) 11/27/2015   Gastric and duodenal ulcer on EGD  . Skin cancer   . Status post dilation of esophageal narrowing   . Stroke ([place])   . Unsteady gait   . Upper GI bleed 11/27/2015   Past Surgical History:  Procedure Laterality Date  . ABDOMINAL HYSTERECTOMY      . APPENDECTOMY    . COLONOSCOPY  03/2005   Dr. Gala Romney: rectal and rectosigmoid polyps removed  . COLONOSCOPY WITH ESOPHAGOGASTRODUODENOSCOPY (EGD) N/A 08/12/2013   Dr. Gala Romney: colonoscopy with tubular adenoma, colonic diverticulosis. EGD with non-critical Schatzki's ring, non-manipulated, and gastric AVM with evidence of recent bleeding, s/p thermal sealing and hemostasis clip  . ESOPHAGEAL DILATION  11/27/2015   Procedure: ESOPHAGEAL DILATION;  Surgeon: Daneil Dolin, MD;  [facility];  Service: Endoscopy;;  . ESOPHAGOGASTRODUODENOSCOPY  03/2005   Dr. Gala Romney: tiny distal esophageal erosions  . ESOPHAGOGASTRODUODENOSCOPY N/A 11/27/2015   RMR: Cricopharyngeus web, status post dilation. Nonbleeding duodenal and gastric ulcers without stigmata of bleeding. Gastric biopsies benign  . ESOPHAGOGASTRODUODENOSCOPY N/A 02/14/2016   Procedure: ESOPHAGOGASTRODUODENOSCOPY (EGD);  Surgeon: Daneil Dolin, MD;  [facility];  Service: Endoscopy;  Laterality: N/A;  300  . GIVENS CAPSULE STUDY N/A 11/27/2015   NOT DONE, WAS NOT INDICATED AT THE TIME  . INTRAMEDULLARY (IM) NAIL INTERTROCHANTERIC Right 12/28/2018   Procedure: INTRAMEDULLARY (IM) NAIL INTERTROCHANTRIC;  Surgeon: Paralee Cancel, MD;  Location: [facility];  Service: Orthopedics;  Laterality: Right;  . MALONEY DILATION N/A 02/14/2016   Procedure: Venia Minks DILATION;  Surgeon: Daneil Dolin, MD;  Location: [facility];  Service: Endoscopy;  Laterality: N/A;  . small bowel capsule study  03/2005   Dr. Laural Golden: two small Dieulafoy lesions at ascending colon. tiney specks of blood at duodenal bulb  but no identified lesions  . TONSILLECTOMY       SOCIAL HISTORY:  Social History   Socioeconomic History  . Marital status: Married    Spouse name: Not on file  . Number of children: 3  . Years of education: Not on file  . Highest education level: Not on file  Occupational History  . Occupation: retired from [employer]  .  Smoking status: Former Smoker    Packs/day: 0.50    Years: 70.00    Pack years: 35.00    Types: Cigarettes    Quit date: 08/26/2014    Years since quitting: 5.2  . Smokeless tobacco: Never Used  . Tobacco comment: about 1/2 ppd  Substance and Sexual Activity  . Alcohol use: No    Alcohol/week: 0.0 standard drinks  . Drug use: No  . Sexual activity: Not on file    Comment: married  Other Topics Concern  . Not on file  Social History Narrative   Lives with husband and son   Caffeine use: 1 cup a day of coffee   Diet coke- 16oz daily   Right handed   Social Determinants of Health   Financial Resource Strain:   . Difficulty of Paying Living Expenses:   Food Insecurity:   . Worried About Charity fundraiser in the Last Year:   . Arboriculturist in the Last Year:   Transportation Needs:   . Film/video editor (Medical):   [name] Lack of Transportation (Non-Medical):   Physical Activity:   . Days of Exercise per Week:   . Minutes of Exercise per Session:   Stress:   . Feeling of Stress :   Social Connections:   . Frequency of Communication with Friends and Family:   . Frequency of Social Gatherings with Friends and Family:   . Attends Religious Services:   . Active Member of Clubs or Organizations:   . Attends Archivist Meetings:   [name] Marital Status:   Intimate Partner Violence:   . Fear of Current or Ex-Partner:   . Emotionally Abused:   [name] Physically Abused:   . Sexually Abused:     FAMILY HISTORY:  Family History  Problem Relation Age of Onset  . Hypertension Mother   . Stroke Mother   . Leukemia Father 24  . Lung cancer Father   . Prostate cancer Son   . Melanoma Son   . Squamous cell carcinoma Son   . Heart attack Daughter   . Hypertension Daughter   . Tremor Daughter   . Colon cancer Neg Hx     CURRENT MEDICATIONS:  Outpatient Encounter Medications as of 11/10/2019  Medication Sig  . Acetaminophen (TYLENOL EXTRA STRENGTH PO) Take by mouth.  prn  . albuterol (PROVENTIL HFA;VENTOLIN HFA) 108 (90 Base) MCG/ACT inhaler INHALE 2 PUFFS INTO THE LUNGS EVERY 4 HOURS AS NEEDED FOR WHEEZING OR SHORTNESS OF BREATH.  [name] ALPRAZolam (XANAX) 0.25 MG tablet TAKE 1/2 TABLET BY MOUTH IN THE MORNING AND 1 TO 2 TABLETS AT BEDTIME AS NEEDED FOR SLEEP.  [name] amLODipine (NORVASC) 2.5 MG tablet TAKE 1 TABLET BY MOUTH ONCE A DAY.  . Calcium Carbonate Antacid (TUMS PO) Take by mouth. 3 -4 a day  . citalopram (CELEXA) 10 MG tablet TAKE ONE TABLET BY MOUTH DAILY.  [name] Cyanocobalamin (VITAMIN B 12 PO) Take 1,000 mcg by mouth every other day.   . dicyclomine (BENTYL) 10 MG capsule Take one tid  prn for abdominal cramps  . ondansetron (ZOFRAN ODT) 4 MG disintegrating tablet Take 1 tablet (4 mg total) by mouth every 8 (eight) hours as needed for nausea or vomiting.  [name] OVER THE COUNTER MEDICATION Vit C one daily One a day multi vitamin Calcium 600mg  + D  Vit b12 1,000 mcg. One every other day  . oxyCODONE (OXY IR/ROXICODONE) 5 MG immediate release tablet 1  tablets 3 times daily as needed severe pain  . pantoprazole (PROTONIX) 40 MG tablet Take 1 tablet (40 mg total) by mouth 2 (two) times daily.  . potassium chloride SA (K-DUR) 20 MEQ tablet Take 1 tablet (20 mEq total) by mouth daily.  . Probiotic Product (PROBIOTIC PO) Take 1 capsule by mouth daily. "10 Strands"  . SPIRIVA HANDIHALER 18 MCG inhalation capsule INHALE 1 PUFF INTO THE LUNGS DAILY  . sucralfate (CARAFATE) 1 g tablet TAKE (1) TABLET BY MOUTH THREE TIMES DAILY AS NEEDED. MAY BREAK UP AND MIX IN WATER.  . SYMBICORT 80-4.5 MCG/ACT inhaler INHALE 2 PUFFS INTO THE LUNGS TWICE DAILY.   Facility-Administered Encounter Medications as of 11/10/2019  Medication  . sodium chloride flush (NS) 0.9 % injection 10 mL    ALLERGIES:  No Known Allergies   Vital signs: -Deferred due to telephone visit  Physical Exam -Deferred due to telephone visit -Patient was alert and oriented over the phone and in no acute  distress.  LABORATORY DATA:  I have reviewed the labs as listed.  CBC    Component Value Date/Time   WBC 5.7 08/17/2019 1051   WBC 9.2 12/31/2018 0632   RBC 4.11 08/17/2019 1051   RBC 2.63 (L) 12/31/2018 0632   HGB 12.0 10/15/2019 1337   HGB 13.2 08/17/2019 1051   HCT 41.2 08/17/2019 1051   PLT 232 08/17/2019 1051   MCV 100 (H) 08/17/2019 1051   MCH 32.1 08/17/2019 1051   MCH 32.3 12/31/2018 0632   MCHC 32.0 08/17/2019 1051   MCHC 31.8 12/31/2018 0632   RDW 11.6 (L) 08/17/2019 1051   LYMPHSABS 1.2 08/17/2019 1051   MONOABS 0.8 12/31/2018 0632   EOSABS 0.1 08/17/2019 1051   BASOSABS 0.1 08/17/2019 1051   CMP Latest Ref Rng & Units 08/17/2019 06/21/2019 12/30/2018  Glucose 65 - 99 mg/dL 89 - 120(H)  BUN 10 - 36 mg/dL 10 - 31(H)  Creatinine 0.57 - 1.00 mg/dL 0.84 0.75 0.78  Sodium 134 - 144 mmol/L 140 - 138  Potassium 3.5 - 5.2 mmol/L 4.5 - 3.9  Chloride 96 - 106 mmol/L 100 - 105  CO2 20 - 29 mmol/L 22 - 25  Calcium 8.7 - 10.3 mg/dL 10.4(H) - 8.7(L)  Total Protein 6.0 - 8.5 g/dL 6.3 - -  Total Bilirubin 0.0 - 1.2 mg/dL 0.7 - -  Alkaline Phos 39 - 117 IU/L 86 - -  AST 0 - 40 IU/L 23 - -  ALT 0 - 32 IU/L 16 - -    All questions were answered to patient's stated satisfaction. Encouraged patient to call with any new concerns or questions before his next visit to the cancer center and we can certain see him sooner, if needed.      ASSESSMENT & PLAN:   Iron deficiency anemia due to chronic blood loss 1.  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 - Last Feraheme infusion was on 10/28/2018 and 11/05/2018. -She occasionally sees dark stools.  However denies any bright red bleeding per rectum. -Son  reports she started on oral iron supplementation which she is taking daily. -Patient reports she occasionally gets constipation alternating with diarrhea.  Her PCP has been following this and has ordered scans that showed no  blockage. -She fell and broke her hip in May and had surgery Dec 28, 2018.  She is at home recovering. -Labs done on 11/01/2019 showed hemoglobin 12.8, ferritin 86, percent saturation 33 -I do not recommend any IV Feraheme at this time. -She will follow-up in 4 months with repeat labs.  She may have her labs done at [facility].      Orders placed this encounter:  Orders Placed This Encounter  Procedures  . Lactate dehydrogenase  . CBC with Differential/Platelet  . Comprehensive metabolic panel  . Ferritin  . Iron and TIBC  . Vitamin B12  . VITAMIN D 25 Hydroxy (Vit-D Deficiency, Fractures)  . Folate    I provided 30 minutes of non face-to-face telephone visit time during this encounter, and > 50% was spent counseling as documented under my assessment & plan.   Francene Finders, FNP-C [place] [phone]

## 2019-11-10 NOTE — Telephone Encounter (Signed)
May have 2 refills each

## 2019-11-11 ENCOUNTER — Other Ambulatory Visit: Payer: Self-pay | Admitting: Family Medicine

## 2019-11-11 DIAGNOSIS — E876 Hypokalemia: Secondary | ICD-10-CM

## 2019-11-11 DIAGNOSIS — D500 Iron deficiency anemia secondary to blood loss (chronic): Secondary | ICD-10-CM

## 2019-11-11 NOTE — Telephone Encounter (Signed)
I will review them once I receive them.  I have not yet received anything.

## 2019-11-16 ENCOUNTER — Telehealth: Payer: Self-pay | Admitting: Family Medicine

## 2019-11-16 NOTE — Telephone Encounter (Signed)
[facility]ett Bill[facility] with [facility] said that she needs an order to go out and give patient an enema because son said she hasn't had a bowl movement in 3 days. Janett Billow [phone]

## 2019-11-16 NOTE — Telephone Encounter (Signed)
May have an order to do so

## 2019-11-16 NOTE — Telephone Encounter (Signed)
Verbal order given to Janett Billow at [facility].

## 2019-11-18 ENCOUNTER — Telehealth: Payer: Self-pay | Admitting: Family Medicine

## 2019-11-18 NOTE — Telephone Encounter (Signed)
Pt received the [employer] vaccine 3/24

## 2019-11-18 NOTE — Telephone Encounter (Signed)
Documented in pts chart

## 2019-11-23 ENCOUNTER — Other Ambulatory Visit (INDEPENDENT_AMBULATORY_CARE_PROVIDER_SITE_OTHER): Payer: Medicare Other

## 2019-11-23 DIAGNOSIS — D500 Iron deficiency anemia secondary to blood loss (chronic): Secondary | ICD-10-CM | POA: Diagnosis not present

## 2019-11-23 LAB — POCT HEMOGLOBIN: Hemoglobin: 12 g/dL (ref 11–14.6)

## 2019-11-29 ENCOUNTER — Telehealth: Payer: Self-pay | Admitting: Family Medicine

## 2019-11-29 ENCOUNTER — Other Ambulatory Visit: Payer: Self-pay | Admitting: *Deleted

## 2019-11-29 MED ORDER — MECLIZINE HCL 25 MG PO TABS: ORAL_TABLET | ORAL | 2 refills | Status: DC

## 2019-11-29 NOTE — Telephone Encounter (Signed)
Meclizine can be used but it can cause significant drowsiness Meclizine 25 mg 1 every 8 hours as needed vertigo  As for the suppositories I am not quite sure what is being asked here regarding what type of suppository?

## 2019-11-29 NOTE — Telephone Encounter (Signed)
Meclizine can be for 20 tablets with 2 refills with the previous instructions thank you

## 2019-11-29 NOTE — Telephone Encounter (Signed)
Meclizine sent to pharm and pt's son mike notified. I called home health nurse to get more information and she states she is wanting a verbal standing order for docusate supp. States she only needs about one a month but every time she needs one she has to call first so would like a standing order. Son [name] also states he only has two left and would like a refill called into [facility].

## 2019-11-29 NOTE — Telephone Encounter (Signed)
May have a standing order for this use 1 daily as needed suppository per home health May: 6 refills

## 2019-11-29 NOTE — Telephone Encounter (Signed)
Pt's son is calling to see if Dr. Nicki Reaper could call in medication for vertigo. Pt had problems with this in the past. At times when she goes to stand up she is getting dizzy and at night when laying in bed room will start spinning.   Janett Billow with [facility] [phone]) came to see pt today and stated if they could get a standing order for suppository so they can give them when needed.   [facility] in [place] will be faxing over some information to see if Dr. Nicki Reaper feels they could be of assistance to pt.

## 2019-11-29 NOTE — Telephone Encounter (Signed)
Carol Massey given verbal order and refills sent to pharm. Son [name] was notified

## 2019-11-30 ENCOUNTER — Telehealth: Payer: Self-pay | Admitting: Family Medicine

## 2019-11-30 NOTE — Telephone Encounter (Signed)
Butch Penny a nurse with [facility] went and saw patient today and after seeing patient and talking with son. Pt does not meet requirements for hospice but needs palliative services instead. She can take a verbal   CB# [phone]

## 2019-11-30 NOTE — Telephone Encounter (Signed)
I am ok with that as long as the son is

## 2019-11-30 NOTE — Telephone Encounter (Signed)
Called son mike and he was ok with pallative care services and I gave donna the verbal order. Pt's son mike wanted to let dr scott know that he gave his mom suppository today and usually after about 30 - 45 mins she has a bowel movement. He states only a tiny amount came out today. Friday states she had a huge a bowel movement and Saturday a normal one, tiny amount on Sunday and none yesterday. States her stomach is hurting a little.

## 2019-12-01 NOTE — Telephone Encounter (Signed)
Discussed with pt's son and he verbalized understanding.  

## 2019-12-01 NOTE — Telephone Encounter (Signed)
Tried to call no answer

## 2019-12-01 NOTE — Telephone Encounter (Signed)
If the patient starts having severe pain or vomiting we would need to see her.  If the condition is getting worse we would recommend to check.  Otherwise family could have home health check the patient for an impaction

## 2019-12-03 ENCOUNTER — Telehealth: Payer: Self-pay | Admitting: *Deleted

## 2019-12-03 NOTE — Telephone Encounter (Signed)
Aneice from [facility] calling to report she did a telephone assessment today with pt and pt's son states she has been more sob since getting [employer] covid 19 vaccine on 3/24. Son [name] states she is using inhaler and nebulizer every day. Wonders if she would benefit from having some oxygen. Does not have a pulse ox at home. Nurse also concerned about pt's hemoglobin. Last one was on 3/30 and result was 12.0. she has upcoming appt on 4/20 with nurse to recheck. Nurse wanted to know if that appt could be moved up a week. Would also like for pt to get order for a scale to get track of weight.   aniece - [phone].

## 2019-12-03 NOTE — Telephone Encounter (Signed)
May have may have an appointment for scale May have an appointment for next week If dramatically worse go to ER over the weekend

## 2019-12-03 NOTE — Telephone Encounter (Signed)
Spoke with son (DPR) who scheduled patient a car visit with Dr Nicki Reaper next week to access shortness of breath and hemoglobin.

## 2019-12-07 ENCOUNTER — Ambulatory Visit (INDEPENDENT_AMBULATORY_CARE_PROVIDER_SITE_OTHER): Payer: Medicare Other | Admitting: Family Medicine

## 2019-12-07 ENCOUNTER — Other Ambulatory Visit: Payer: Self-pay

## 2019-12-07 DIAGNOSIS — R0602 Shortness of breath: Secondary | ICD-10-CM

## 2019-12-07 DIAGNOSIS — D6489 Other specified anemias: Secondary | ICD-10-CM | POA: Diagnosis not present

## 2019-12-07 LAB — POCT HEMOGLOBIN: Hemoglobin: 11.7 g/dL (ref 11–14.6)

## 2019-12-07 NOTE — Progress Notes (Signed)
   Subjective:    Patient ID: Carol Massey, female    DOB: 06/21/1926, 86 y.o.   MRN: [id number]  HPI Patient is seen today with complaints of SOB and concerns about hemoglobin. Car visit Patient does not want to come into the office.  Denies any new severe troubles.  Relates intermittent shortness of breath.  Family concerned about a possible anemia she has had severe anemia in the past Per patients son today's vitals:  Bp 126/63 Pulse 75 O2 95% Fall Risk  08/04/2019 06/21/2019 05/27/2014 05/25/2014 11/30/2013  Falls in the past year? 0 0 No No No  Risk for fall due to : Impaired balance/gait;Impaired mobility;History of fall(s);Orthopedic patient - - - -  Follow up Falls evaluation completed;Education provided Falls evaluation completed - - -    Results for orders placed or performed in visit on 12/07/19  POCT hemoglobin  Result Value Ref Range   Hemoglobin 11.7 11 - 14.6 g/dL    Review of Systems  Constitutional: Negative for activity change and appetite change.  HENT: Negative for congestion and rhinorrhea.   Respiratory: Negative for cough and shortness of breath.   Cardiovascular: Negative for chest pain and leg swelling.  Gastrointestinal: Negative for abdominal pain, nausea and vomiting.  Skin: Negative for color change.  Neurological: Negative for dizziness and weakness.  Psychiatric/Behavioral: Negative for agitation and confusion.       Objective:   Physical Exam Vitals reviewed.  Constitutional:      General: She is not in acute distress. HENT:     Head: Normocephalic and atraumatic.  Eyes:     General:        Right eye: No discharge.        Left eye: No discharge.  Neck:     Trachea: No tracheal deviation.  Cardiovascular:     Rate and Rhythm: Normal rate and regular rhythm.     Heart sounds: Normal heart sounds. No murmur.  Pulmonary:     Effort: Pulmonary effort is normal. No respiratory distress.     Breath sounds: Normal breath sounds.   Lymphadenopathy:     Cervical: No cervical adenopathy.  Skin:    General: Skin is warm and dry.  Neurological:     Mental Status: She is alert.     Coordination: Coordination normal.  Psychiatric:        Behavior: Behavior normal.           Assessment & Plan:  I believe her COPD is stable Hemoglobin stable Extremities no edema skin warm dry no need to do any additional test currently Reassurance given Repeat hemoglobin in 3 weeks

## 2019-12-14 ENCOUNTER — Other Ambulatory Visit: Payer: Medicare Other | Admitting: Internal Medicine

## 2019-12-14 ENCOUNTER — Other Ambulatory Visit: Payer: Medicare Other

## 2019-12-14 ENCOUNTER — Other Ambulatory Visit: Payer: Self-pay

## 2019-12-14 DIAGNOSIS — Z7189 Other specified counseling: Secondary | ICD-10-CM

## 2019-12-14 DIAGNOSIS — Z515 Encounter for palliative care: Secondary | ICD-10-CM

## 2019-12-14 NOTE — Progress Notes (Signed)
April 20th, 2021 [facility] Consult Note Telephone: [phone]  Fax: [phone]  PATIENT NAME: Carol Massey DOB: 10-20-1925 MRN: [id number]  [place], [place] [address] [phone] (Mobile)   [phone] (Home Phone)  PRIMARY CARE PROVIDER:   [name], MD  REFERRING PROVIDER:  [name], MD [place],  [place] [address]  [phone]  RESPONSIBLE PARTY: Carol Massey (Son)  [phone] [facility] Phone)  ASSESSMENT / RECOMMENDATIONS:  1. Advance Care Planning: A. Directives: completed DNR and MOST form; both left in the home. MOST B. Goals of Care:  2. Cognitive / Functional status:  Patient is alert and oriented x 3. Son reports she is a little more disoriented / wobbly over the last few weeks, which he attributes to her J & J shot at that time. Her Hgb was checked and in adequate range. May be d/t chronic/episodic vertigo, perhaps exacerbated by this Spring pollen season. She has Antivert available; takes an occasional  tab at hs but causes subsequent daytime somnolence nxt day so uses sparingly. Has an occasional dry cough suggestive of post nasal gtt, as well as a drippy nose.  -recommended OTC Flonase 1 inhal each nare bid, and daily Claritin or Zyrtec.  She ambulates about the home with stand by guarding assist / rolling walker/gait belt. She fell last yr and broke her R hip. She is continent of bowel and bladder. Needs assist for bathing/dressing. She is continent of bowel and bladder.  She has dyspnea on exertion. Her resting Oxygen Sats are 95% RA for me, with clear lung sounds.  Consumes 2 meals a day plus snack/Boost. Decreased appetite these last 3 weeks. Her weight has been stable @ 100lbs. At 4'9" her BMI is 21.6kg/m2.   She has a R heel raised growth, present for about a yr. Son reports is an ulcer. It looks like a warty growth to me; about 3/4 inch round and raised about 1/3".  Dressed by [place] weekly/painted with betadine.   Experiences epigastric pain from hiatal hernia exacerbated if constipated; improved with bowel movements. Tendency towards constipation managed with prune juice.   3. Family Supports: Resides in her home in the care of her son. Two daughters (one local, one in [place]) not involved in care. Recently made it to the top of the 2-year waiting list for aide service ([facility]) M-F 4 hrs/day 11-3 or 9-1 (for last 6 weeks). Son has frequent f/u visits for himself in surveillance for head/neck, skin cancers.  4. Follow up Palliative Care Visit: Mon 02/21/20 @ 1pm I spent 60 minutes providing this consultation from 1pm to 2pm. More than 50% of the time in this consultation was spent coordinating communication.   HISTORY OF PRESENT ILLNESS:  Carol Massey is a 86 y.o.  female with h/o arthritis, asthma, COPD, dieulafoy lesion of the colon, gastric AVM, anxiety, HTN, anemia of chronic blood loss, kidney stone, peptic ulcer disease, ulcers (gastric/duodenal on EGD), s/p esophageal dilation, stroke, unsteady gait. 5/3-12/31/2018 hospitalized for R ORIF 12/28/2018.   Palliative Care was asked to help address goals of care.   CODE STATUS: DNR  PPS: 40% HOSPICE ELIGIBILITY/DIAGNOSIS: TBD   PAST MEDICAL HISTORY:  Past Medical History:  Diagnosis Date  . Anemia, chronic disease 01/21/2018  . Arthritis   . Asthma   . COPD (chronic obstructive pulmonary disease) ([place])   . Dieulafoy lesion of colon 2006  . Gastric AVM 2014  .  Generalized anxiety disorder 07/24/2015  . Hard of hearing   . Hypertension   . Iron deficiency anemia due to chronic blood loss 05/27/2014  . Kidney stone   . Major depression in remission ([place]) 07/24/2015  . Osteoporosis   . PUD (peptic ulcer disease) 11/27/2015   Gastric and duodenal ulcer on EGD  . Skin cancer   . Status post dilation of esophageal narrowing   . Stroke ([place])   . Unsteady gait   .  Upper GI bleed 11/27/2015    SOCIAL HX:  Social History   Tobacco Use  . Smoking status: Former Smoker    Packs/day: 0.50    Years: 70.00    Pack years: 35.00    Types: Cigarettes    Quit date: 08/26/2014    Years since quitting: 5.3  . Smokeless tobacco: Never Used  . Tobacco comment: about 1/2 ppd  Substance Use Topics  . Alcohol use: No    Alcohol/week: 0.0 standard drinks    ALLERGIES: No Known Allergies   PERTINENT MEDICATIONS:  Outpatient Encounter Medications as of 12/14/2019  Medication Sig  . Acetaminophen (TYLENOL EXTRA STRENGTH PO) Take by mouth. prn  . albuterol (PROVENTIL HFA;VENTOLIN HFA) 108 (90 Base) MCG/ACT inhaler INHALE 2 PUFFS INTO THE LUNGS EVERY 4 HOURS AS NEEDED FOR WHEEZING OR SHORTNESS OF BREATH.  [name] ALPRAZolam (XANAX) 0.25 MG tablet TAKE 1/2 TABLET BY MOUTH IN THE MORNING AND 1 TO 2 TABLETS AT BEDTIME AS NEEDED FOR SLEEP.  [name] amLODipine (NORVASC) 2.5 MG tablet TAKE 1 TABLET BY MOUTH ONCE A DAY.  . Calcium Carbonate Antacid (TUMS PO) Take by mouth. 3 -4 a day  . citalopram (CELEXA) 10 MG tablet TAKE ONE TABLET BY MOUTH DAILY.  [name] Cyanocobalamin (VITAMIN B 12 PO) Take 1,000 mcg by mouth every other day.   . dicyclomine (BENTYL) 10 MG capsule Take one tid prn for abdominal cramps  . meclizine (ANTIVERT) 25 MG tablet Take one every 8 hours prn vertigo. Caution drowiness  . ondansetron (ZOFRAN ODT) 4 MG disintegrating tablet Take 1 tablet (4 mg total) by mouth every 8 (eight) hours as needed for nausea or vomiting.  [name] OVER THE COUNTER MEDICATION Vit C one daily One a day multi vitamin Calcium 600mg  + D  Vit b12 1,000 mcg. One every other day  . oxyCODONE (OXY IR/ROXICODONE) 5 MG immediate release tablet 1  tablets 3 times daily as needed severe pain  . pantoprazole (PROTONIX) 40 MG tablet Take 1 tablet (40 mg total) by mouth 2 (two) times daily.  . potassium chloride SA (KLOR-CON) 20 MEQ tablet TAKE 1 TABLET BY MOUTH ONCE DAILY.  . Probiotic Product (PROBIOTIC  PO) Take 1 capsule by mouth daily. "10 Strands"  . SPIRIVA HANDIHALER 18 MCG inhalation capsule INHALE 1 PUFF INTO THE LUNGS DAILY  . sucralfate (CARAFATE) 1 g tablet TAKE (1) TABLET BY MOUTH THREE TIMES DAILY AS NEEDED. MAY BREAK UP AND MIX IN WATER.  . SYMBICORT 80-4.5 MCG/ACT inhaler INHALE 2 PUFFS INTO THE LUNGS TWICE DAILY.   Facility-Administered Encounter Medications as of 12/14/2019  Medication  . sodium chloride flush (NS) 0.9 % injection 10 mL    PHYSICAL EXAM:   98.4 oral , 95% RA, HR 78, 142/56,  General: NAD, frail appearing, thin Cardiovascular: regular rate and rhythm. Systolic murmur RUSB Pulmonary: clear ant fields Abdomen: soft, nontender, + bowel sounds GU: no suprapubic tenderness Extremities: no edema, no joint deformities Skin: no rashes Neurological: Weakness but otherwise non-focal  Julianne Handler, NP

## 2019-12-15 ENCOUNTER — Encounter: Payer: Self-pay | Admitting: Internal Medicine

## 2019-12-15 ENCOUNTER — Other Ambulatory Visit: Payer: Self-pay | Admitting: Internal Medicine

## 2019-12-17 NOTE — Telephone Encounter (Signed)
error 

## 2019-12-23 ENCOUNTER — Telehealth: Payer: Self-pay | Admitting: Family Medicine

## 2019-12-23 NOTE — Telephone Encounter (Signed)
Larene Beach notified on voicemail

## 2019-12-23 NOTE — Telephone Encounter (Signed)
Larene Beach [facility]) contacted and verbalized understanding.

## 2019-12-23 NOTE — Telephone Encounter (Signed)
That is fine 

## 2019-12-23 NOTE — Telephone Encounter (Signed)
Carol Massey with [facility] calling requesting verbal orders to continue skilled nursing for wound care. 1 x 9 weeks.   Carol Massey CB# [phone]

## 2019-12-28 ENCOUNTER — Other Ambulatory Visit: Payer: Self-pay

## 2019-12-28 ENCOUNTER — Other Ambulatory Visit (INDEPENDENT_AMBULATORY_CARE_PROVIDER_SITE_OTHER): Payer: Medicare Other | Admitting: *Deleted

## 2019-12-28 DIAGNOSIS — D500 Iron deficiency anemia secondary to blood loss (chronic): Secondary | ICD-10-CM | POA: Diagnosis not present

## 2019-12-28 LAB — POCT HEMOGLOBIN: Hemoglobin: 12.9 g/dL (ref 11–14.6)

## 2019-12-30 DIAGNOSIS — L89610 Pressure ulcer of right heel, unstageable: Secondary | ICD-10-CM | POA: Diagnosis not present

## 2019-12-30 DIAGNOSIS — D500 Iron deficiency anemia secondary to blood loss (chronic): Secondary | ICD-10-CM | POA: Diagnosis not present

## 2019-12-30 DIAGNOSIS — J449 Chronic obstructive pulmonary disease, unspecified: Secondary | ICD-10-CM | POA: Diagnosis not present

## 2020-01-05 ENCOUNTER — Other Ambulatory Visit: Payer: Self-pay | Admitting: Family Medicine

## 2020-01-05 NOTE — Telephone Encounter (Signed)
Seen 12/07/19

## 2020-01-11 ENCOUNTER — Other Ambulatory Visit: Payer: Self-pay

## 2020-01-11 ENCOUNTER — Other Ambulatory Visit (INDEPENDENT_AMBULATORY_CARE_PROVIDER_SITE_OTHER): Payer: Medicare Other

## 2020-01-11 DIAGNOSIS — D500 Iron deficiency anemia secondary to blood loss (chronic): Secondary | ICD-10-CM

## 2020-01-11 LAB — POCT HEMOGLOBIN: Hemoglobin: 12.7 g/dL (ref 11–14.6)

## 2020-01-18 ENCOUNTER — Other Ambulatory Visit: Payer: Medicare Other

## 2020-01-25 ENCOUNTER — Other Ambulatory Visit (INDEPENDENT_AMBULATORY_CARE_PROVIDER_SITE_OTHER): Payer: Medicare Other | Admitting: *Deleted

## 2020-01-25 ENCOUNTER — Other Ambulatory Visit: Payer: Self-pay

## 2020-01-25 DIAGNOSIS — D6489 Other specified anemias: Secondary | ICD-10-CM

## 2020-01-25 LAB — POCT HEMOGLOBIN: Hemoglobin: 12.1 g/dL (ref 11–14.6)

## 2020-01-28 IMAGING — DX DG HIP (WITH OR WITHOUT PELVIS) 2-3V RIGHT
3 series · 3 of 3 positions shown · non-contrast
Comparison: None.

CLINICAL DATA: Right hip pain

EXAM:
DG HIP (WITH OR WITHOUT PELVIS) 2-3V RIGHT

[pelvis ap]
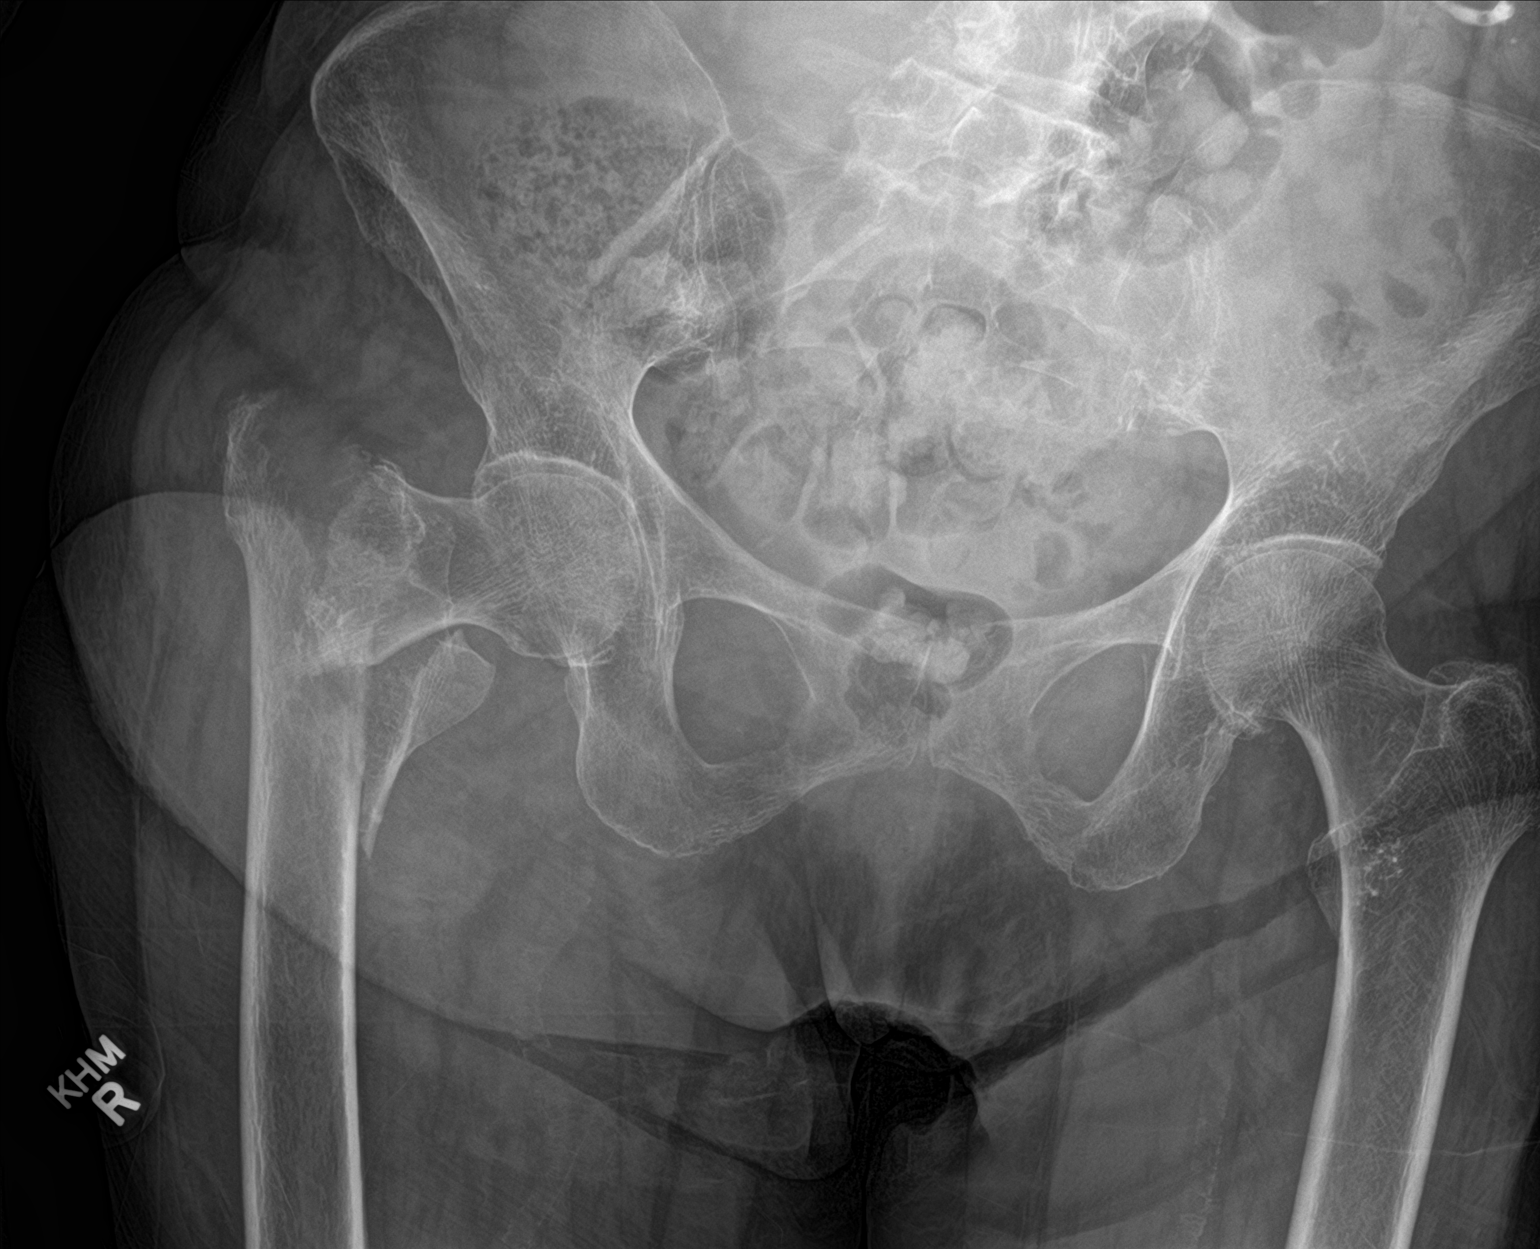

[hip ap]
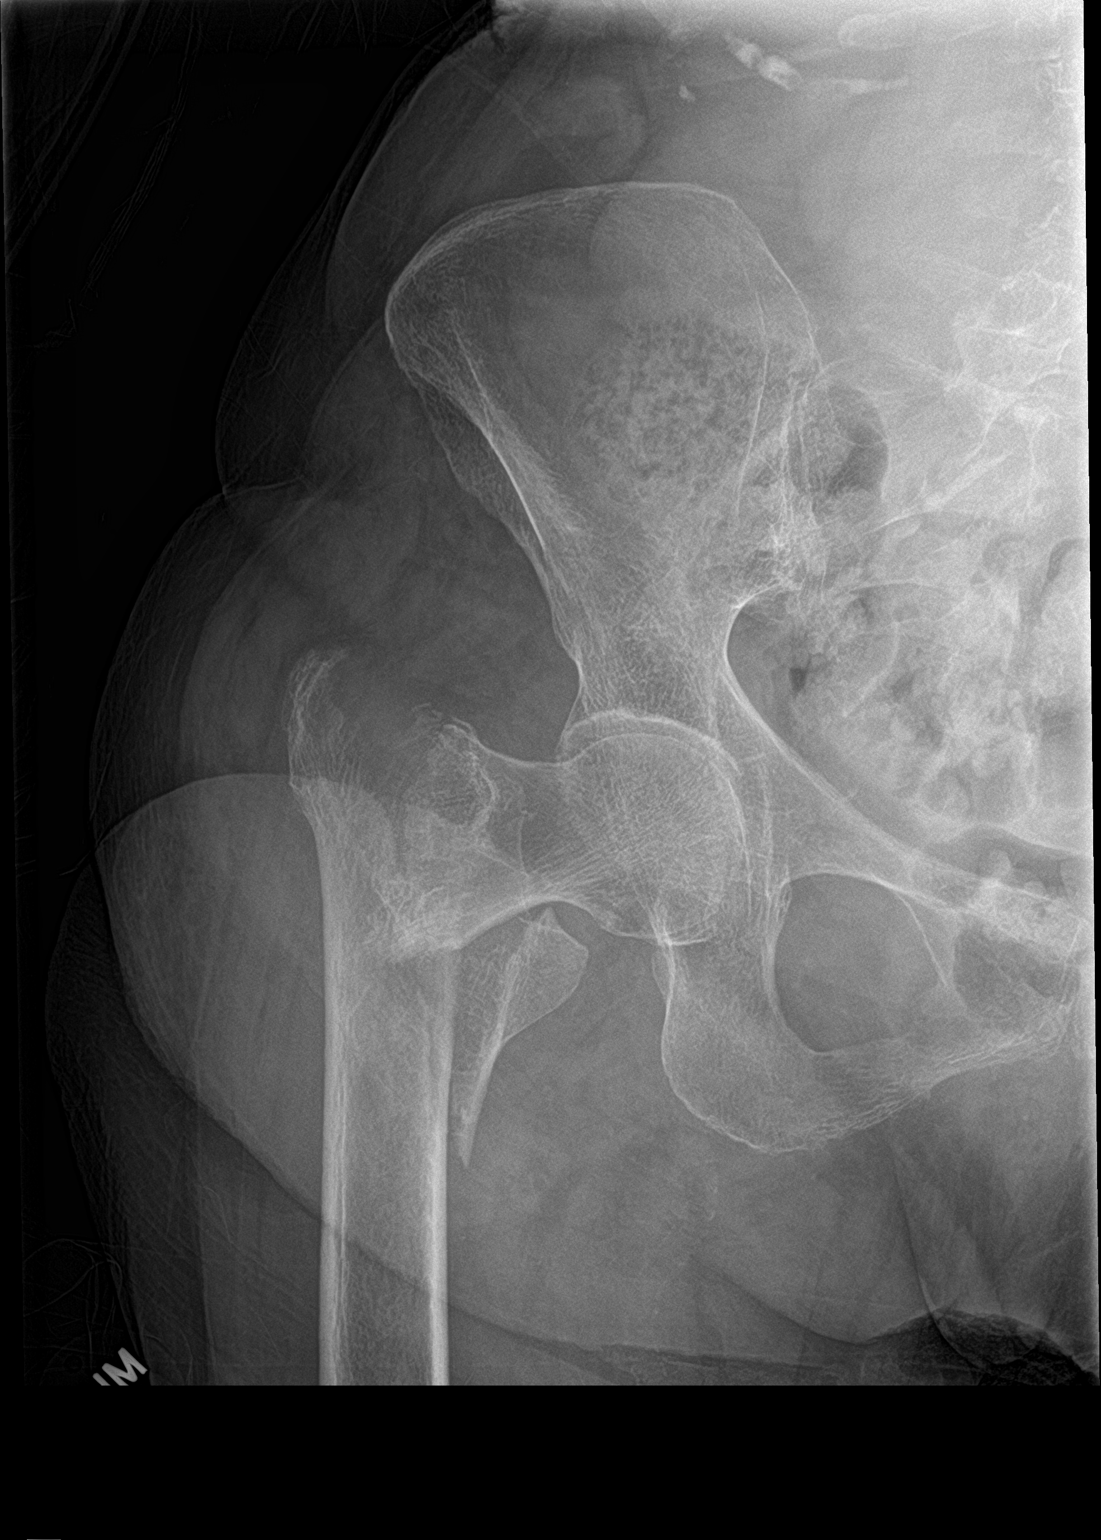

[hip lat]
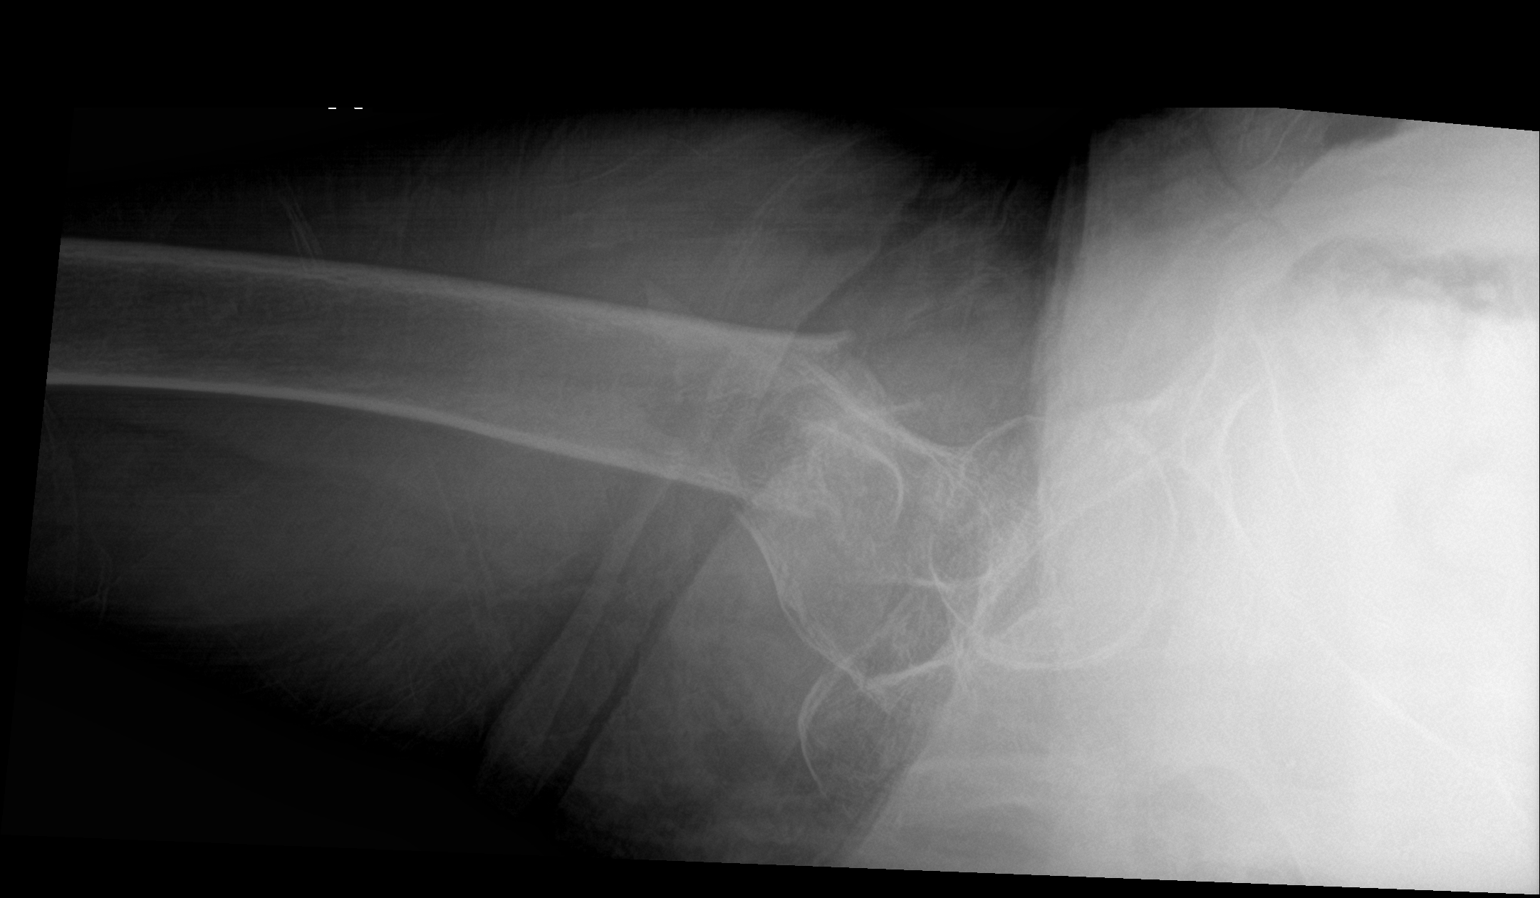

[3 of 3 positions shown; findings below may reference images not displayed]

FINDINGS: Severe osteopenia.

Comminuted and displaced right intertrochanteric fracture with apex
superior angulation. No other fracture or dislocation. No aggressive
osseous lesion.
IMPRESSION: 1. Comminuted and displaced right intertrochanteric fracture with
apex superior angulation.

## 2020-01-28 IMAGING — DX CHEST  1 VIEW
1 series · 1 of 1 positions shown · non-contrast
Comparison: 01/17/2017

CLINICAL DATA: Right hip fracture

EXAM:
CHEST  1 VIEW

[chest ap]
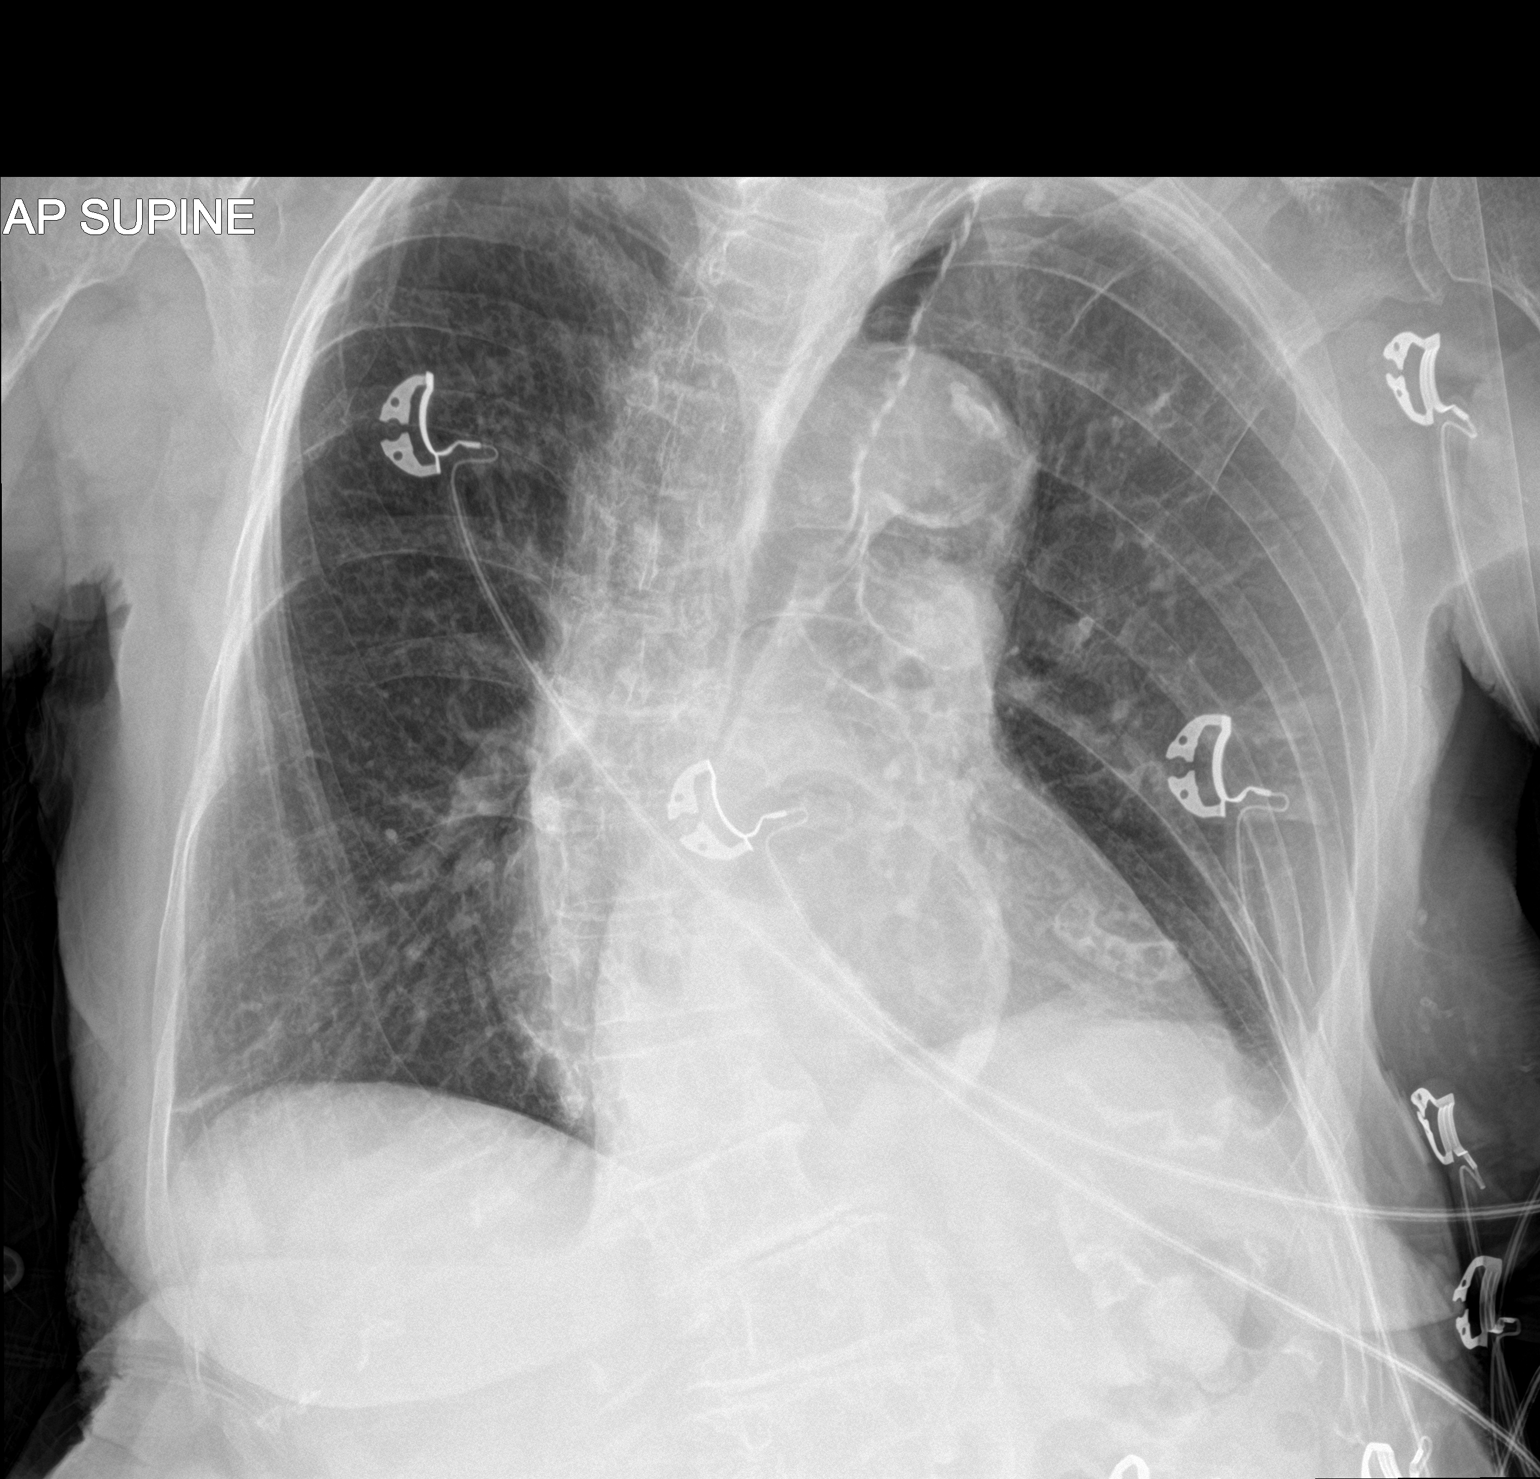

[1 of 1 positions shown; findings below may reference images not displayed]

FINDINGS: There is no focal parenchymal opacity. There is no pleural effusion
or pneumothorax. The heart and mediastinal contours are
unremarkable.

There is a moderate size hiatal hernia.

There is a dextroscoliosis of the thoracic spine.
IMPRESSION: No active disease.

## 2020-02-02 ENCOUNTER — Other Ambulatory Visit: Payer: Self-pay | Admitting: Family Medicine

## 2020-02-02 DIAGNOSIS — E876 Hypokalemia: Secondary | ICD-10-CM

## 2020-02-02 DIAGNOSIS — D500 Iron deficiency anemia secondary to blood loss (chronic): Secondary | ICD-10-CM

## 2020-02-09 ENCOUNTER — Ambulatory Visit: Payer: Medicare Other | Admitting: Family Medicine

## 2020-02-09 ENCOUNTER — Other Ambulatory Visit: Payer: Self-pay

## 2020-02-09 ENCOUNTER — Encounter: Payer: Self-pay | Admitting: Family Medicine

## 2020-02-09 VITALS — BP 118/72 | Temp 98.2°F

## 2020-02-09 DIAGNOSIS — J431 Panlobular emphysema: Secondary | ICD-10-CM | POA: Diagnosis not present

## 2020-02-09 DIAGNOSIS — D6489 Other specified anemias: Secondary | ICD-10-CM | POA: Diagnosis not present

## 2020-02-09 DIAGNOSIS — I10 Essential (primary) hypertension: Secondary | ICD-10-CM | POA: Diagnosis not present

## 2020-02-09 LAB — POCT HEMOGLOBIN: Hemoglobin: 12.9 g/dL (ref 11–14.6)

## 2020-02-09 NOTE — Progress Notes (Signed)
   Subjective:    Patient ID: Carol Massey, female    DOB: 09-10-1925, 86 y.o.   MRN: [id number]  HPI  Patient arrives with son who is concerned his mother is nodding off and sleeping more than usual. Recently saw ENT who prescribed some medication to help with ear pain and they put her on a muscle relaxer and this is causing her to have some sedation I have encouraged family to cut this dose in half and only use it at bedtime and then over the next several days once things get better stop it No rectal bleeding energy level doing okay Appetite not as good as previous head bob is a lot during the day She tends to breathe fast when agitated or aroused but breathes normally when she is resting Family has palliative care approach currently Results for orders placed or performed in visit on 02/09/20  POCT hemoglobin  Result Value Ref Range   Hemoglobin 12.9 11 - 14.6 g/dL    Review of Systems Negative for hematuria vomiting rectal bleeding    Objective:   Physical Exam Lungs are clear respiratory rate normal heart regular no murmurs extremities no edema skin warm dry       Assessment & Plan:  1. Anemia due to other cause, not classified No signs of GI bleeding.  Hemoglobin stable.  Follow-up again in 2 month - POCT hemoglobin  2. Essential hypertension Blood pressure good control continue current measures  3. Panlobular emphysema ([place]) Patient with emphysema get short of breath with activity but this is expected for her age and condition she does have some level of deconditioning as well  Mild tremor in her head with activity no medication indicated currently  Fragility related to age  Palliative care approach will discuss further with the son on next discussion

## 2020-02-17 ENCOUNTER — Telehealth: Payer: Self-pay | Admitting: Family Medicine

## 2020-02-17 NOTE — Telephone Encounter (Signed)
Carol Massey from [facility] wants to see if a referral can be done to [facility] in [place] for [place].   Carol Massey call back number is [phone]

## 2020-02-18 NOTE — Telephone Encounter (Signed)
Rerouted

## 2020-02-20 NOTE — Telephone Encounter (Signed)
Please go ahead with referral This is also contingent on son being able to take her

## 2020-02-21 ENCOUNTER — Other Ambulatory Visit: Payer: Medicare Other | Admitting: Internal Medicine

## 2020-02-21 ENCOUNTER — Other Ambulatory Visit: Payer: Self-pay

## 2020-02-21 ENCOUNTER — Other Ambulatory Visit: Payer: Self-pay | Admitting: Family Medicine

## 2020-02-21 NOTE — Telephone Encounter (Signed)
Left message to return call 

## 2020-02-21 NOTE — Telephone Encounter (Signed)
6 months on each May pend

## 2020-02-22 ENCOUNTER — Other Ambulatory Visit: Payer: Self-pay | Admitting: *Deleted

## 2020-02-22 DIAGNOSIS — L97419 Non-pressure chronic ulcer of right heel and midfoot with unspecified severity: Secondary | ICD-10-CM

## 2020-02-22 NOTE — Telephone Encounter (Signed)
Referral in epic and son in agreement. Patient son also mentioned you were going to call him around lunch one day to discuss pt and wanted to make sure you were still wanting to have that conversation.

## 2020-02-24 ENCOUNTER — Telehealth: Payer: Self-pay | Admitting: Family Medicine

## 2020-02-24 NOTE — Telephone Encounter (Signed)
May have verbal orders thank you 

## 2020-02-24 NOTE — Telephone Encounter (Signed)
Larene Beach nurse with [facility] calling requesting verbal orders for nursing 1 x 9 weeks for wound care.   CB# [phone]

## 2020-02-25 ENCOUNTER — Telehealth: Payer: Self-pay | Admitting: Family Medicine

## 2020-02-25 ENCOUNTER — Telehealth: Payer: Self-pay

## 2020-02-25 NOTE — Telephone Encounter (Signed)
Received call from son who request discharge from [facility] because they are closer and have had family interaction with [facility].  Very complimentary of [facility]'s services

## 2020-02-25 NOTE — Telephone Encounter (Signed)
Carol Massey with Hospice calling requesting palative order be faxed to them.   Fax # [phone]

## 2020-02-25 NOTE — Telephone Encounter (Signed)
Verbal orders given to [facility]

## 2020-02-29 ENCOUNTER — Telehealth: Payer: Self-pay | Admitting: Family Medicine

## 2020-02-29 NOTE — Telephone Encounter (Signed)
Nurses Please write out what ever order that needs to be done I will sign it then our staff can fax

## 2020-02-29 NOTE — Telephone Encounter (Signed)
I believe I have faxed every single thing that has come my way regarding this patient  Please talk with home health if they need a written mail order have the nurses do this and I will sign them fax thank you

## 2020-02-29 NOTE — Telephone Encounter (Signed)
Left message to return call with Colletta Maryland at [facility]

## 2020-02-29 NOTE — Telephone Encounter (Signed)
Hospice will fax over order and forms for completion

## 2020-02-29 NOTE — Telephone Encounter (Signed)
Contacted Stephane with Hospice she is stating she needs an order stating attending will provide care while on palative and something in regards to symptom management or advance care.   She states pt was using [facility] but son Amada Jupiter that care as of Friday.   Stephanie CB# [phone] EXT [phone].  FAX# [phone]

## 2020-03-01 ENCOUNTER — Encounter: Payer: Self-pay | Admitting: Family Medicine

## 2020-03-02 ENCOUNTER — Telehealth: Payer: Self-pay | Admitting: Family Medicine

## 2020-03-02 NOTE — Telephone Encounter (Signed)
Form was filled out thank you 

## 2020-03-02 NOTE — Telephone Encounter (Signed)
Call her son for an update and discussion

## 2020-03-02 NOTE — Telephone Encounter (Signed)
Hospice called and is inquiring about papers. Autumn is off this evening and I am not sure exactly what it is that [facility]osp[facility] is needing. i see a message to write out whatever order it is that is needed and we will fax; another note says that hospice will fax over forms. Guerin gave forms to Autumn and it was placed in provider office. Please advise. Thank you

## 2020-03-03 NOTE — Telephone Encounter (Signed)
Completed form faxed to Hospice

## 2020-03-05 ENCOUNTER — Other Ambulatory Visit: Payer: Self-pay | Admitting: Family Medicine

## 2020-03-10 ENCOUNTER — Other Ambulatory Visit: Payer: Self-pay

## 2020-03-10 ENCOUNTER — Inpatient Hospital Stay (HOSPITAL_COMMUNITY): Payer: Medicare Other | Attending: Hematology

## 2020-03-10 ENCOUNTER — Inpatient Hospital Stay (HOSPITAL_COMMUNITY): Payer: Medicare Other

## 2020-03-10 DIAGNOSIS — D509 Iron deficiency anemia, unspecified: Secondary | ICD-10-CM | POA: Insufficient documentation

## 2020-03-10 DIAGNOSIS — I10 Essential (primary) hypertension: Secondary | ICD-10-CM | POA: Insufficient documentation

## 2020-03-10 DIAGNOSIS — J449 Chronic obstructive pulmonary disease, unspecified: Secondary | ICD-10-CM | POA: Diagnosis not present

## 2020-03-10 DIAGNOSIS — D500 Iron deficiency anemia secondary to blood loss (chronic): Secondary | ICD-10-CM

## 2020-03-10 LAB — CBC WITH DIFFERENTIAL/PLATELET
Abs Immature Granulocytes: 0.02 10*3/uL (ref 0.00–0.07)
Basophils Absolute: 0.1 10*3/uL (ref 0.0–0.1)
Basophils Relative: 1 %
Eosinophils Absolute: 0.1 10*3/uL (ref 0.0–0.5)
Eosinophils Relative: 2 %
HCT: 38.8 % (ref 36.0–46.0)
Hemoglobin: 12.5 g/dL (ref 12.0–15.0)
Immature Granulocytes: 0 %
Lymphocytes Relative: 19 %
Lymphs Abs: 1.3 10*3/uL (ref 0.7–4.0)
MCH: 32.1 pg (ref 26.0–34.0)
MCHC: 32.2 g/dL (ref 30.0–36.0)
MCV: 99.7 fL (ref 80.0–100.0)
Monocytes Absolute: 0.6 10*3/uL (ref 0.1–1.0)
Monocytes Relative: 9 %
Neutro Abs: 4.6 10*3/uL (ref 1.7–7.7)
Neutrophils Relative %: 69 %
Platelets: 248 10*3/uL (ref 150–400)
RBC: 3.89 MIL/uL (ref 3.87–5.11)
RDW: 13 % (ref 11.5–15.5)
WBC: 6.7 10*3/uL (ref 4.0–10.5)
nRBC: 0 % (ref 0.0–0.2)

## 2020-03-10 LAB — COMPREHENSIVE METABOLIC PANEL
ALT: 17 U/L (ref 0–44)
AST: 25 U/L (ref 15–41)
Albumin: 3.9 g/dL (ref 3.5–5.0)
Alkaline Phosphatase: 70 U/L (ref 38–126)
Anion gap: 10 (ref 5–15)
BUN: 10 mg/dL (ref 8–23)
CO2: 25 mmol/L (ref 22–32)
Calcium: 9.9 mg/dL (ref 8.9–10.3)
Chloride: 101 mmol/L (ref 98–111)
Creatinine, Ser: 0.85 mg/dL (ref 0.44–1.00)
GFR calc Af Amer: >60 mL/min (ref >60)
GFR calc non Af Amer: 59 mL/min — ABNORMAL LOW (ref >60)
Glucose, Bld: 119 mg/dL — ABNORMAL HIGH (ref 70–99)
Potassium: 4.3 mmol/L (ref 3.5–5.1)
Sodium: 136 mmol/L (ref 135–145)
Total Bilirubin: 1.1 mg/dL (ref 0.3–1.2)
Total Protein: 6.8 g/dL (ref 6.5–8.1)

## 2020-03-10 LAB — IRON AND TIBC
Iron: 70 ug/dL (ref 28–170)
Saturation Ratios: 21 % (ref 10.4–31.8)
TIBC: 329 ug/dL (ref 250–450)
UIBC: 259 ug/dL

## 2020-03-10 LAB — LACTATE DEHYDROGENASE: LDH: 162 U/L (ref 98–192)

## 2020-03-10 LAB — VITAMIN B12: Vitamin B-12: 1233 pg/mL — ABNORMAL HIGH (ref 180–914)

## 2020-03-10 LAB — FERRITIN: Ferritin: 24 ng/mL (ref 11–307)

## 2020-03-10 LAB — FOLATE: Folate: 41.5 ng/mL (ref >5.9)

## 2020-03-10 LAB — VITAMIN D 25 HYDROXY (VIT D DEFICIENCY, FRACTURES): Vit D, 25-Hydroxy: 33.56 ng/mL (ref 30–100)

## 2020-03-14 ENCOUNTER — Other Ambulatory Visit: Payer: Medicare Other | Admitting: Internal Medicine

## 2020-03-16 NOTE — Telephone Encounter (Signed)
Noted thank you

## 2020-03-16 NOTE — Telephone Encounter (Signed)
Called pt's son mike and he states tomorrow at 11:50 would work best. Asked tami to add to schedule at 11:50 for telephone conference.

## 2020-03-16 NOTE — Telephone Encounter (Signed)
Nurses Please call her son [name] I would like to set up a time for me to call him to discuss his mom Unfortunately with the transition of Dr. Richardson Landry retiring it has been too busy to fit this in but I would like to be able to do this tomorrow-Friday-I can call him near [phone] or near [phone] please have patient choose which one would be best for him.  Please place on my schedule as a telephone conference thank you

## 2020-03-17 ENCOUNTER — Inpatient Hospital Stay (HOSPITAL_BASED_OUTPATIENT_CLINIC_OR_DEPARTMENT_OTHER): Payer: Medicare Other | Admitting: Nurse Practitioner

## 2020-03-17 ENCOUNTER — Ambulatory Visit (INDEPENDENT_AMBULATORY_CARE_PROVIDER_SITE_OTHER): Payer: Medicare Other | Admitting: Family Medicine

## 2020-03-17 ENCOUNTER — Telehealth: Payer: Self-pay | Admitting: Family Medicine

## 2020-03-17 ENCOUNTER — Other Ambulatory Visit: Payer: Self-pay

## 2020-03-17 ENCOUNTER — Other Ambulatory Visit (HOSPITAL_COMMUNITY): Payer: Self-pay | Admitting: Nurse Practitioner

## 2020-03-17 DIAGNOSIS — D500 Iron deficiency anemia secondary to blood loss (chronic): Secondary | ICD-10-CM | POA: Diagnosis not present

## 2020-03-17 DIAGNOSIS — J431 Panlobular emphysema: Secondary | ICD-10-CM | POA: Diagnosis not present

## 2020-03-17 DIAGNOSIS — D6489 Other specified anemias: Secondary | ICD-10-CM | POA: Diagnosis not present

## 2020-03-17 MED ORDER — SUCRALFATE 1 G PO TABS: ORAL_TABLET | ORAL | 5 refills | Status: DC

## 2020-03-17 NOTE — Assessment & Plan Note (Signed)
1.  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 - Last Feraheme infusion was on 10/28/2018 and 11/05/2018. -She occasionally sees dark stools.  However denies any bright red bleeding per rectum. -Son reports she started on oral iron supplementation which she is taking daily. -Patient reports she occasionally gets constipation alternating with diarrhea.  Her PCP has been following this and has ordered scans that showed no blockage. -She fell and broke her hip in May and had surgery Dec 28, 2018.  She is at home recovering. -Labs done on 03/10/2020 showed hemoglobin 12.5, ferritin 24, percent saturation 21 -I recommend she get 2 infusions of IV iron at this time. -She will follow-up in 4 months with repeat labs.  She may have her labs done at [facility].

## 2020-03-17 NOTE — Telephone Encounter (Signed)
noted 

## 2020-03-17 NOTE — Telephone Encounter (Signed)
6 refills °

## 2020-03-17 NOTE — Telephone Encounter (Signed)
Pt son called needs refill on sucralfate (CARAFATE) 1 g tablet   [employer]

## 2020-03-17 NOTE — Progress Notes (Signed)
Telephone discussion with her son regarding palliative care  Overall I support palliative care for this patient.  Her condition is terminal but there is a possibility that she could live on for quite some time so therefore palliative seems the best approach  They will do a follow-up visit within the next few weeks Her appetite is doing well her breathing is doing well

## 2020-03-17 NOTE — Telephone Encounter (Signed)
Refills sent and pt's son notified and mike also wanted to let dr Nicki Reaper know Hospital called and will be doing  iron infusions on July 27th and august 10th

## 2020-03-17 NOTE — Progress Notes (Signed)
[facility] Follow up:    Carol Drown, [place] [id number]   DIAGNOSIS: Iron deficiency anemia  CURRENT THERAPY: Intermittent iron infusions  INTERVAL HISTORY: Carol Massey 86 y.o. female was called for telephone visit for iron deficiency anemia.  Patient reports she occasionally has small amounts of blood in her stool.  Her primary care doctor is pricking her finger every 3 weeks to check her hemoglobin.  It has been stable.  Her energy levels have been a little bit more decreased lately.  Denies any nausea, vomiting, or diarrhea. Denies any new pains. Had not noticed any recent bleeding such as epistaxis, hematuria or hematochezia. Denies recent chest pain on exertion, shortness of breath on minimal exertion, pre-syncopal episodes, or palpitations. Denies any numbness or tingling in hands or feet. Denies any recent fevers, infections, or recent hospitalizations. Patient reports appetite at 50% and energy level at 25%.    Patient Active Problem List   Diagnosis Date Noted   LUQ abdominal pain 09/22/2019   Nausea without vomiting 09/22/2019   Belching 09/22/2019   Constipation 05/18/2019   Abdominal pain 05/18/2019   Vomiting 05/18/2019   Intertrochanteric fracture of right hip ([place]) 12/28/2018   Closed right hip fracture, initial encounter ([place]) 12/27/2018   Anemia, chronic disease 01/21/2018   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   Essential hypertension 12/12/2016   History of peptic ulcer disease    Ataxia 12/01/2015   PUD (peptic ulcer disease)    Melena 11/27/2015   Upper GI bleed 11/27/2015   Tobacco abuse 11/27/2015   Azotemia 11/27/2015   Right-sided chest pain 11/27/2015   Dysphagia    Generalized anxiety disorder 07/24/2015   Major depression in remission ([place]) 07/24/2015   Iron deficiency anemia due to chronic blood loss 05/27/2014   Hyperlipemia 03/18/2013   COPD (chronic obstructive  pulmonary disease) ([place]) 01/12/2013   Osteoporosis 01/12/2013    has No Known Allergies.  MEDICAL HISTORY: Past Medical History:  Diagnosis Date   Anemia, chronic disease 01/21/2018   Arthritis    Asthma    COPD (chronic obstructive pulmonary disease) ([place])    Dieulafoy lesion of colon 2006   Gastric AVM 2014   Generalized anxiety disorder 07/24/2015   Hard of hearing    Hypertension    Iron deficiency anemia due to chronic blood loss 05/27/2014   Kidney stone    Major depression in remission ([place]) 07/24/2015   Osteoporosis    PUD (peptic ulcer disease) 11/27/2015   Gastric and duodenal ulcer on EGD   Skin cancer    Status post dilation of esophageal narrowing    Stroke ([place])    Unsteady gait    Upper GI bleed 11/27/2015    SURGICAL HISTORY: Past Surgical History:  Procedure Laterality Date   ABDOMINAL HYSTERECTOMY     APPENDECTOMY     COLONOSCOPY  03/2005   Dr. Gala Romney: rectal and rectosigmoid polyps removed   COLONOSCOPY WITH ESOPHAGOGASTRODUODENOSCOPY (EGD) N/A 08/12/2013   Dr. Gala Romney: colonoscopy with tubular adenoma, colonic diverticulosis. EGD with non-critical Schatzki's ring, non-manipulated, and gastric AVM with evidence of recent bleeding, s/p thermal sealing and hemostasis clip   ESOPHAGEAL DILATION  11/27/2015   Procedure: ESOPHAGEAL DILATION;  Surgeon: Daneil Dolin, MD;  Location: [facility];  Service: Endoscopy;;   ESOPHAGOGASTRODUODENOSCOPY  03/2005   Dr. Gala Romney: tiny distal esophageal erosions   ESOPHAGOGASTRODUODENOSCOPY N/A 11/27/2015   RMR: Cricopharyngeus web, status post dilation. Nonbleeding duodenal and  gastric ulcers without stigmata of bleeding. Gastric biopsies benign   ESOPHAGOGASTRODUODENOSCOPY N/A 02/14/2016   Procedure: ESOPHAGOGASTRODUODENOSCOPY (EGD);  Surgeon: Daneil Dolin, MD;  Location: [facility];  Service: Endoscopy;  Laterality: N/A;  300   GIVENS CAPSULE STUDY N/A 11/27/2015   NOT DONE, WAS NOT INDICATED AT  THE TIME   INTRAMEDULLARY (IM) NAIL INTERTROCHANTERIC Right 12/28/2018   Procedure: INTRAMEDULLARY (IM) NAIL INTERTROCHANTRIC;  Surgeon: Paralee Cancel, MD;  Location: [facility];  Service: Orthopedics;  Laterality: Right;   MALONEY DILATION N/A 02/14/2016   Procedure: Venia Minks DILATION;  Surgeon: Daneil Dolin, MD;  Location: [facility];  Service: Endoscopy;  Laterality: N/A;   small bowel capsule study  03/2005   Dr. Laural Golden: two small Dieulafoy lesions at ascending colon. tiney specks of blood at duodenal bulb but no identified lesions   TONSILLECTOMY      SOCIAL HISTORY: Social History   Socioeconomic History   Marital status: Married    Spouse name: Not on file   Number of children: 3   Years of education: Not on file   Highest education level: Not on file  Occupational History   Occupation: retired from [employer] Use   Smoking status: Former Smoker    Packs/day: 0.50    Years: 70.00    Pack years: 35.00    Types: Cigarettes    Quit date: 08/26/2014    Years since quitting: 5.5   Smokeless tobacco: Never Used   Tobacco comment: about 1/2 ppd  Vaping Use   Vaping Use: Never used  Substance and Sexual Activity   Alcohol use: No    Alcohol/week: 0.0 standard drinks   Drug use: No   Sexual activity: Not on file    Comment: married  Other Topics Concern   Not on file  Social History Narrative   Lives with husband and son   Caffeine use: 1 cup a day of coffee   Diet coke- 16oz daily   Right handed   Social Determinants of Health   Financial Resource Strain:    Difficulty of Paying Living Expenses:   Food Insecurity:    Worried About Charity fundraiser in the Last Year:    Arboriculturist in the Last Year:   Transportation Needs:    Film/video editor (Medical):    Lack of Transportation (Non-Medical):   Physical Activity:    Days of Exercise per Week:    Minutes of Exercise per Session:   Stress:    Feeling of Stress :    Social Connections:    Frequency of Communication with Friends and Family:    Frequency of Social Gatherings with Friends and Family:    Attends Religious Services:    Active Member of Clubs or Organizations:    Attends Music therapist:    Marital Status:   Intimate Partner Violence:    Fear of Current or Ex-Partner:    Emotionally Abused:    Physically Abused:    Sexually Abused:     FAMILY HISTORY: Family History  Problem Relation Age of Onset   Hypertension Mother    Stroke Mother    Leukemia Father 1   Lung cancer Father    Prostate cancer Son    Melanoma Son    Squamous cell carcinoma Son    Heart attack Daughter    Hypertension Daughter    Tremor Daughter    Colon cancer Neg Hx  Review of Systems  Constitutional: Positive for fatigue.  Gastrointestinal: Positive for blood in stool.  All other systems reviewed and are negative.   Vital signs: -Deferred to telephone visit  Physical Exam -Deferred due to telephone visit  LABORATORY DATA:  CBC    Component Value Date/Time   WBC 6.7 03/10/2020 1010   RBC 3.89 03/10/2020 1010   HGB 12.5 03/10/2020 1010   HGB 13.2 08/17/2019 1051   HCT 38.8 03/10/2020 1010   HCT 41.2 08/17/2019 1051   PLT 248 03/10/2020 1010   PLT 232 08/17/2019 1051   MCV 99.7 03/10/2020 1010   MCV 100 (H) 08/17/2019 1051   MCH 32.1 03/10/2020 1010   MCHC 32.2 03/10/2020 1010   RDW 13.0 03/10/2020 1010   RDW 11.6 (L) 08/17/2019 1051   LYMPHSABS 1.3 03/10/2020 1010   LYMPHSABS 1.2 08/17/2019 1051   MONOABS 0.6 03/10/2020 1010   EOSABS 0.1 03/10/2020 1010   EOSABS 0.1 08/17/2019 1051   BASOSABS 0.1 03/10/2020 1010   BASOSABS 0.1 08/17/2019 1051    CMP     Component Value Date/Time   NA 136 03/10/2020 1010   NA 140 08/17/2019 1051   K 4.3 03/10/2020 1010   CL 101 03/10/2020 1010   CO2 25 03/10/2020 1010   GLUCOSE 119 (H) 03/10/2020 1010   BUN 10 03/10/2020 1010   BUN 10 08/17/2019  1051   CREATININE 0.85 03/10/2020 1010   CREATININE 0.60 06/17/2013 1241   CALCIUM 9.9 03/10/2020 1010   PROT 6.8 03/10/2020 1010   PROT 6.3 08/17/2019 1051   ALBUMIN 3.9 03/10/2020 1010   ALBUMIN 4.4 08/17/2019 1051   AST 25 03/10/2020 1010   ALT 17 03/10/2020 1010   ALKPHOS 70 03/10/2020 1010   BILITOT 1.1 03/10/2020 1010   BILITOT 0.7 08/17/2019 1051   GFRNONAA 59 (L) 03/10/2020 1010   GFRAA >60 03/10/2020 1010    All questions were answered to patient's stated satisfaction. Encouraged patient to call with any new concerns or questions before his next visit to the cancer center and we can certain see him sooner, if needed.     ASSESSMENT and THERAPY PLAN:   Iron deficiency anemia due to chronic blood loss 1.  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 - Last Feraheme infusion was on 10/28/2018 and 11/05/2018. -She occasionally sees dark stools.  However denies any bright red bleeding per rectum. -Son reports she started on oral iron supplementation which she is taking daily. -Patient reports she occasionally gets constipation alternating with diarrhea.  Her PCP has been following this and has ordered scans that showed no blockage. -She fell and broke her hip in May and had surgery Dec 28, 2018.  She is at home recovering. -Labs done on 03/10/2020 showed hemoglobin 12.5, ferritin 24, percent saturation 21 -I recommend she get 2 infusions of IV iron at this time. -She will follow-up in 4 months with repeat labs.  She may have her labs done at [facility] This Encounter  Procedures   Lactate dehydrogenase    Standing Status:   Future    Standing Expiration Date:   03/17/2021   CBC with Differential/Platelet    Standing Status:   Future    Standing Expiration Date:   03/17/2021   Comprehensive metabolic panel    Standing Status:   Future    Standing Expiration Date:   03/17/2021   Ferritin    Standing  Status:  Future    Standing Expiration Date:   03/17/2021   Iron and TIBC    Standing Status:   Future    Standing Expiration Date:   03/17/2021   Vitamin B12    Standing Status:   Future    Standing Expiration Date:   03/17/2021   VITAMIN D 25 Hydroxy (Vit-D Deficiency, Fractures)    Standing Status:   Future    Standing Expiration Date:   03/17/2021    All questions were answered. The patient knows to call the clinic with any problems, questions or concerns. We can certainly see the patient much sooner if necessary. This note was electronically signed.  I provided 28 minutes of non face-to-face telephone visit time during this encounter, and > 50% was spent counseling as documented under my assessment & plan.   Glennie Isle, NP-C 03/17/2020

## 2020-03-21 ENCOUNTER — Encounter (HOSPITAL_COMMUNITY): Payer: Self-pay

## 2020-03-21 ENCOUNTER — Inpatient Hospital Stay (HOSPITAL_COMMUNITY): Payer: Medicare Other

## 2020-03-21 VITALS — BP 115/66 | HR 77 | Temp 97.5°F | Resp 20

## 2020-03-21 DIAGNOSIS — D509 Iron deficiency anemia, unspecified: Secondary | ICD-10-CM | POA: Diagnosis not present

## 2020-03-21 DIAGNOSIS — D500 Iron deficiency anemia secondary to blood loss (chronic): Secondary | ICD-10-CM

## 2020-03-21 MED ORDER — SODIUM CHLORIDE 0.9 % IV SOLN
510.0000 mg | Freq: Once | INTRAVENOUS | Status: AC
  Administered 2020-03-21: 510 mg via INTRAVENOUS
  Filled 2020-03-21: qty 17

## 2020-03-21 MED ORDER — SODIUM CHLORIDE 0.9 % IV SOLN: Freq: Once | INTRAVENOUS | Status: AC

## 2020-03-21 NOTE — Progress Notes (Signed)
Carol Massey tolerated iron infusion well today without incidence.  Vital signs WNL upon discharge.  Discharged via wheelchair with son.

## 2020-03-21 NOTE — Patient Instructions (Signed)
[facility]  Discharge Instructions:   _______________________________________________________________  Thank you for choosing [facility] at [facility] to provide your oncology and hematology care.  To afford each patient quality time with our providers, please arrive at least 15 minutes before your scheduled appointment.  You need to re-schedule your appointment if you arrive 10 or more minutes late.  We strive to give you quality time with our providers, and arriving late affects you and other patients whose appointments are after yours.  Also, if you no show three or more times for appointments you may be dismissed from the clinic.  Again, thank you for choosing [facility] at [facility]. Our hope is that these requests will allow you access to exceptional care and in a timely manner. _______________________________________________________________  If you have questions after your visit, please contact our office at [phone] between the hours of 8:30 a.m. and 5:00 p.m. Voicemails left after 4:30 p.m. will not be returned until the following business day. _______________________________________________________________  For prescription refill requests, have your pharmacy contact our office. _______________________________________________________________  Recommendations made by the consultant and any test results will be sent to your referring physician. _______________________________________________________________ 

## 2020-03-23 ENCOUNTER — Telehealth: Payer: Self-pay | Admitting: Family Medicine

## 2020-03-23 ENCOUNTER — Other Ambulatory Visit: Payer: Self-pay | Admitting: *Deleted

## 2020-03-23 MED ORDER — SUCRALFATE 1 G PO TABS: ORAL_TABLET | ORAL | 3 refills | Status: DC

## 2020-03-23 NOTE — Telephone Encounter (Signed)
May have a prescription for 1 taken 3 times daily, #180, 3 refills

## 2020-03-23 NOTE — Telephone Encounter (Signed)
Patient son notified and rx sent.

## 2020-03-23 NOTE — Telephone Encounter (Signed)
Nurses please clarify how often are they taking the Carafate?  As for the follow-up this can be pushed out to the first week of September

## 2020-03-23 NOTE — Telephone Encounter (Signed)
Pt's son calling requesting that the refill on sucralfate (CARAFATE) 1 g tablet qty be changed to 180 tablets so it gives her a 60 day supply.  Pt also received iron treatment on 7/27 and will receive another on 8/10. She is scheduled with Dr. Nicki Reaper on 8/16 for hemoglobin car visit. [name] would like to know if they could push this visit out with her receiving the iron on 8/10 but wanted Dr. Lars Mage recommendation on when to reschedule appt.

## 2020-03-23 NOTE — Telephone Encounter (Signed)
Patient is taking medication 3 x per day

## 2020-03-23 NOTE — Telephone Encounter (Signed)
Please advise. Thank you

## 2020-03-24 ENCOUNTER — Telehealth: Payer: Self-pay | Admitting: Family Medicine

## 2020-03-24 NOTE — Telephone Encounter (Signed)
Spoke to pharmacy and said they had the script but it was rejected by insurance because the last fill on med is not up yet --son notified pharmacy can run this ago 8/3

## 2020-03-24 NOTE — Telephone Encounter (Signed)
Pt's son calling stating pharmacy did not receive the refill on sucralfate (CARAFATE) 1 g tablet   [facility].

## 2020-03-29 ENCOUNTER — Encounter (HOSPITAL_BASED_OUTPATIENT_CLINIC_OR_DEPARTMENT_OTHER): Payer: Medicare Other | Admitting: Physician Assistant

## 2020-03-30 ENCOUNTER — Other Ambulatory Visit: Payer: Self-pay | Admitting: Family Medicine

## 2020-04-04 ENCOUNTER — Encounter (HOSPITAL_COMMUNITY): Payer: Self-pay

## 2020-04-04 ENCOUNTER — Inpatient Hospital Stay (HOSPITAL_COMMUNITY): Payer: Medicare Other | Attending: Hematology

## 2020-04-04 ENCOUNTER — Other Ambulatory Visit: Payer: Self-pay

## 2020-04-04 VITALS — BP 143/58 | HR 70 | Temp 97.2°F | Resp 16

## 2020-04-04 DIAGNOSIS — D509 Iron deficiency anemia, unspecified: Secondary | ICD-10-CM | POA: Diagnosis present

## 2020-04-04 DIAGNOSIS — D500 Iron deficiency anemia secondary to blood loss (chronic): Secondary | ICD-10-CM

## 2020-04-04 MED ORDER — SODIUM CHLORIDE 0.9 % IV SOLN: Freq: Once | INTRAVENOUS | Status: AC

## 2020-04-04 MED ORDER — SODIUM CHLORIDE 0.9 % IV SOLN
510.0000 mg | Freq: Once | INTRAVENOUS | Status: AC
  Administered 2020-04-04: 510 mg via INTRAVENOUS
  Filled 2020-04-04: qty 510

## 2020-04-04 NOTE — Progress Notes (Signed)
Patient presents today for Feraheme infusion. Vitals signs stable. Patient has no complaints of any changes since her last visit. Patient has pain in her neck that she rates a 10/10.   Feraheme given today per MD orders. Tolerated infusion without adverse affects. Vital signs stable. No complaints at this time. Discharged from clinic via wheel chair. Patient's son at the bedside requesting information pertaining to home infusions of Feraheme through [facility]. [facility] notified by [facility]. Patient teaching performed. Son informed that someone from the [facility] will call and let them know if insurance and [place] can fulfill request.  F/U with [facility] as scheduled.

## 2020-04-04 NOTE — Patient Instructions (Signed)
[facility]  Discharge Instructions:   _______________________________________________________________  Thank you for choosing [facility] at [facility] to provide your oncology and hematology care.  To afford each patient quality time with our providers, please arrive at least 15 minutes before your scheduled appointment.  You need to re-schedule your appointment if you arrive 10 or more minutes late.  We strive to give you quality time with our providers, and arriving late affects you and other patients whose appointments are after yours.  Also, if you no show three or more times for appointments you may be dismissed from the clinic.  Again, thank you for choosing  [facility] at [facility]. Our hope is that these requests will allow you access to exceptional care and in a timely manner. _______________________________________________________________  If you have questions after your visit, please contact our office at [phone] between the hours of 8:30 a.m. and 5:00 p.m. Voicemails left after 4:30 p.m. will not be returned until the following business day. _______________________________________________________________  For prescription refill requests, have your pharmacy contact our office. _______________________________________________________________  Recommendations made by the consultant and any test results will be sent to your referring physician. _______________________________________________________________ 

## 2020-04-10 ENCOUNTER — Ambulatory Visit: Payer: Medicare Other | Admitting: Family Medicine

## 2020-04-26 ENCOUNTER — Encounter (HOSPITAL_BASED_OUTPATIENT_CLINIC_OR_DEPARTMENT_OTHER): Payer: Medicare Other | Admitting: Physician Assistant

## 2020-05-03 ENCOUNTER — Ambulatory Visit (INDEPENDENT_AMBULATORY_CARE_PROVIDER_SITE_OTHER): Payer: Medicare Other | Admitting: Family Medicine

## 2020-05-03 ENCOUNTER — Other Ambulatory Visit: Payer: Self-pay

## 2020-05-03 DIAGNOSIS — D6489 Other specified anemias: Secondary | ICD-10-CM

## 2020-05-03 LAB — POCT HEMOGLOBIN: Hemoglobin: 12 g/dL (ref 11–14.6)

## 2020-05-03 NOTE — Progress Notes (Signed)
   Subjective:    Patient ID: Carol Massey, female    DOB: 1926-06-24, 86 y.o.   MRN: [id number]  Hypertension This is a chronic problem. The current episode started more than 1 year ago. Risk factors for coronary artery disease include post-menopausal state and sedentary lifestyle. Treatments tried: norvasc. There are no compliance problems.    Patient has history of GI bleed. Car visit Occasionally having dark stool Overall doing well Family working well with her Palliative care   Review of Systems See above    Objective:   Physical Exam Lungs clear heart regular extremities no edema       Assessment & Plan:  History of anemia hemoglobin stable recheck again in 4 weeks 3 weeks if necessary depending on family schedule  Intermittent abdominal pain with bowel movements overall doing well currently  Flu vaccine when available senior dose

## 2020-05-10 DIAGNOSIS — J449 Chronic obstructive pulmonary disease, unspecified: Secondary | ICD-10-CM | POA: Diagnosis not present

## 2020-05-10 DIAGNOSIS — L89610 Pressure ulcer of right heel, unstageable: Secondary | ICD-10-CM | POA: Diagnosis not present

## 2020-05-10 DIAGNOSIS — M810 Age-related osteoporosis without current pathological fracture: Secondary | ICD-10-CM | POA: Diagnosis not present

## 2020-05-24 ENCOUNTER — Other Ambulatory Visit (INDEPENDENT_AMBULATORY_CARE_PROVIDER_SITE_OTHER): Payer: Medicare Other | Admitting: *Deleted

## 2020-05-24 ENCOUNTER — Other Ambulatory Visit: Payer: Self-pay

## 2020-05-24 DIAGNOSIS — Z23 Encounter for immunization: Secondary | ICD-10-CM | POA: Diagnosis not present

## 2020-05-24 DIAGNOSIS — D6489 Other specified anemias: Secondary | ICD-10-CM | POA: Diagnosis not present

## 2020-05-24 LAB — POCT HEMOGLOBIN: Hemoglobin: 13.4 g/dL (ref 11–14.6)

## 2020-05-24 NOTE — Addendum Note (Signed)
Addended by: Dairl Ponder on: 05/24/2020 09:47 AM   Modules accepted: Orders

## 2020-06-14 ENCOUNTER — Telehealth: Payer: Self-pay

## 2020-06-14 NOTE — Telephone Encounter (Signed)
Pt son [name] contacted and verbalized understanding. Pt transferred up front to set up appt.

## 2020-06-14 NOTE — Telephone Encounter (Signed)
Please advise. Thank you

## 2020-06-14 NOTE — Telephone Encounter (Signed)
Carol Massey called [phone]  Home nurse come out and said [name] had soar on back of right foot about two months ago the scab come off and now has scabbed back up and nurse recommended that  Dr Nicki Reaper could remove the scab for it to heal up.   Call [name] back at [phone]

## 2020-06-14 NOTE — Telephone Encounter (Signed)
So it depends to a degree what the scab looks like regarding whether or not this is something that can be removed by [place] or not  Only way that can be done is via an office visit. (Potentially this will need to be early to mid next week unless it is infected.  If infected we could see if this week)

## 2020-06-19 ENCOUNTER — Ambulatory Visit: Payer: Medicare Other | Admitting: Family Medicine

## 2020-06-19 ENCOUNTER — Other Ambulatory Visit: Payer: Self-pay

## 2020-06-19 DIAGNOSIS — L97419 Non-pressure chronic ulcer of right heel and midfoot with unspecified severity: Secondary | ICD-10-CM | POA: Diagnosis not present

## 2020-06-19 DIAGNOSIS — D500 Iron deficiency anemia secondary to blood loss (chronic): Secondary | ICD-10-CM

## 2020-06-19 LAB — POCT HEMOGLOBIN: Hemoglobin: 13 g/dL (ref 11–14.6)

## 2020-06-19 MED ORDER — MUPIROCIN 2 % EX OINT: TOPICAL_OINTMENT | CUTANEOUS | 6 refills | Status: DC

## 2020-06-19 NOTE — Progress Notes (Signed)
   Subjective:    Patient ID: Carol Massey, female    DOB: [birth year], 86 y.o.   MRN: [id number]  HPI Patient has bed sore on right heel that has scabbed over.  Son states nurse mentioned doctor may want to remove the scab.  Patient had a pressure point on the back of her heel that developed a very thickened scab family was doing a good job taking care of it but now the scab appears like it so thick that it needs to be trimmed away.  No pus or drainage.  History of anemia and GI blood loss family would like the hemoglobin checked  Results for orders placed or performed in visit on 06/19/20  POCT hemoglobin  Result Value Ref Range   Hemoglobin 13 11 - 14.6 g/dL    Review of Systems See above    Objective:   Physical Exam  Lungs clear respiratory normal heart regular no murmurs the left heel has pressure-point with a scab this was trimmed away without any bleeding issues This is a healing heel ulcer it still has a small base to it that needs to be protected and dressed on a daily basis by family     Assessment & Plan:  Heel wound-should gradually heal up recommend heel cushions when sleeping recommend Bactroban once or twice daily until healed up  COPD stable  History of GI blood loss stable repeat hemoglobin again in early December will be seeing hematology in mid November

## 2020-06-21 ENCOUNTER — Ambulatory Visit: Payer: Medicare Other | Admitting: Family Medicine

## 2020-06-21 ENCOUNTER — Other Ambulatory Visit: Payer: Medicare Other

## 2020-07-10 ENCOUNTER — Other Ambulatory Visit (HOSPITAL_COMMUNITY): Payer: Self-pay

## 2020-07-10 DIAGNOSIS — D500 Iron deficiency anemia secondary to blood loss (chronic): Secondary | ICD-10-CM

## 2020-07-10 DIAGNOSIS — E538 Deficiency of other specified B group vitamins: Secondary | ICD-10-CM

## 2020-07-11 ENCOUNTER — Inpatient Hospital Stay (HOSPITAL_COMMUNITY): Payer: Medicare Other

## 2020-07-11 ENCOUNTER — Other Ambulatory Visit: Payer: Self-pay

## 2020-07-11 ENCOUNTER — Inpatient Hospital Stay (HOSPITAL_COMMUNITY): Payer: Medicare Other | Attending: Hematology

## 2020-07-11 DIAGNOSIS — E785 Hyperlipidemia, unspecified: Secondary | ICD-10-CM | POA: Insufficient documentation

## 2020-07-11 DIAGNOSIS — I10 Essential (primary) hypertension: Secondary | ICD-10-CM | POA: Diagnosis not present

## 2020-07-11 DIAGNOSIS — M810 Age-related osteoporosis without current pathological fracture: Secondary | ICD-10-CM | POA: Insufficient documentation

## 2020-07-11 DIAGNOSIS — D509 Iron deficiency anemia, unspecified: Secondary | ICD-10-CM | POA: Insufficient documentation

## 2020-07-11 DIAGNOSIS — J449 Chronic obstructive pulmonary disease, unspecified: Secondary | ICD-10-CM | POA: Diagnosis not present

## 2020-07-11 DIAGNOSIS — E538 Deficiency of other specified B group vitamins: Secondary | ICD-10-CM

## 2020-07-11 DIAGNOSIS — D500 Iron deficiency anemia secondary to blood loss (chronic): Secondary | ICD-10-CM

## 2020-07-11 LAB — FERRITIN: Ferritin: 256 ng/mL (ref 11–307)

## 2020-07-11 LAB — COMPREHENSIVE METABOLIC PANEL
ALT: 15 U/L (ref 0–44)
AST: 21 U/L (ref 15–41)
Albumin: 3.9 g/dL (ref 3.5–5.0)
Alkaline Phosphatase: 63 U/L (ref 38–126)
Anion gap: 9 (ref 5–15)
BUN: 12 mg/dL (ref 8–23)
CO2: 25 mmol/L (ref 22–32)
Calcium: 9.9 mg/dL (ref 8.9–10.3)
Chloride: 102 mmol/L (ref 98–111)
Creatinine, Ser: 0.85 mg/dL (ref 0.44–1.00)
GFR, Estimated: >60 mL/min (ref >60)
Glucose, Bld: 134 mg/dL — ABNORMAL HIGH (ref 70–99)
Potassium: 4.3 mmol/L (ref 3.5–5.1)
Sodium: 136 mmol/L (ref 135–145)
Total Bilirubin: 1.2 mg/dL (ref 0.3–1.2)
Total Protein: 6.6 g/dL (ref 6.5–8.1)

## 2020-07-11 LAB — CBC WITH DIFFERENTIAL/PLATELET
Abs Immature Granulocytes: 0.01 10*3/uL (ref 0.00–0.07)
Basophils Absolute: 0.1 10*3/uL (ref 0.0–0.1)
Basophils Relative: 1 %
Eosinophils Absolute: 0.1 10*3/uL (ref 0.0–0.5)
Eosinophils Relative: 2 %
HCT: 39.9 % (ref 36.0–46.0)
Hemoglobin: 13 g/dL (ref 12.0–15.0)
Immature Granulocytes: 0 %
Lymphocytes Relative: 20 %
Lymphs Abs: 1.2 10*3/uL (ref 0.7–4.0)
MCH: 33.4 pg (ref 26.0–34.0)
MCHC: 32.6 g/dL (ref 30.0–36.0)
MCV: 102.6 fL — ABNORMAL HIGH (ref 80.0–100.0)
Monocytes Absolute: 0.5 10*3/uL (ref 0.1–1.0)
Monocytes Relative: 9 %
Neutro Abs: 3.9 10*3/uL (ref 1.7–7.7)
Neutrophils Relative %: 68 %
Platelets: 212 10*3/uL (ref 150–400)
RBC: 3.89 MIL/uL (ref 3.87–5.11)
RDW: 12.4 % (ref 11.5–15.5)
WBC: 5.8 10*3/uL (ref 4.0–10.5)
nRBC: 0 % (ref 0.0–0.2)

## 2020-07-11 LAB — VITAMIN B12: Vitamin B-12: 1320 pg/mL — ABNORMAL HIGH (ref 180–914)

## 2020-07-11 LAB — IRON AND TIBC
Iron: 106 ug/dL (ref 28–170)
Saturation Ratios: 42 % — ABNORMAL HIGH (ref 10.4–31.8)
TIBC: 254 ug/dL (ref 250–450)
UIBC: 148 ug/dL

## 2020-07-11 LAB — LACTATE DEHYDROGENASE: LDH: 142 U/L (ref 98–192)

## 2020-07-12 LAB — VITAMIN D 25 HYDROXY (VIT D DEFICIENCY, FRACTURES): Vit D, 25-Hydroxy: 31.2 ng/mL (ref 30–100)

## 2020-07-14 ENCOUNTER — Inpatient Hospital Stay (HOSPITAL_BASED_OUTPATIENT_CLINIC_OR_DEPARTMENT_OTHER): Payer: Medicare Other | Admitting: Oncology

## 2020-07-14 ENCOUNTER — Other Ambulatory Visit: Payer: Self-pay

## 2020-07-14 DIAGNOSIS — D500 Iron deficiency anemia secondary to blood loss (chronic): Secondary | ICD-10-CM | POA: Diagnosis not present

## 2020-07-14 NOTE — Progress Notes (Signed)
[facility] Follow up:    Carol Drown, MD [address] [place] [address]   DIAGNOSIS: Iron deficiency anemia  CURRENT THERAPY: Intermittent iron infusions  I connected with Abran Duke on 07/14/20 at 12:50 PM EST by telephone visit and verified that I am speaking with the correct person using two identifiers.   I discussed the limitations, risks, security and privacy concerns of performing an evaluation and management service by telemedicine and the availability of in-person appointments. I also discussed with the patient that there may be a patient responsible charge related to this service. The patient expressed understanding and agreed to proceed.   Other persons participating in the visit and their role in the encounter: Son  Patient's location: Home Provider's location: Clinic   INTERVAL HISTORY: Carol Massey 86 y.o. female was called for telephone visit for iron deficiency anemia.  Patient reports well since her last IV infusion.  Her appetite and energy level have improved.  She has chronic intermittent constipation.  She is followed monthly by her PCP Dr. Wolfgang Phoenix to keep an eye on her hemoglobin.  It has been stable.  Denies any nausea, vomiting, or diarrhea. Denies any new pains. Had not noticed any recent bleeding such as epistaxis, hematuria or hematochezia. Denies recent chest pain on exertion, shortness of breath on minimal exertion, pre-syncopal episodes, or palpitations. Denies any numbness or tingling in hands or feet. Denies any recent fevers, infections, or recent hospitalizations. Patient reports appetite at 50% and energy level at 75%.    Patient Active Problem List   Diagnosis Date Noted  . LUQ abdominal pain 09/22/2019  . Nausea without vomiting 09/22/2019  . Belching 09/22/2019  . Constipation 05/18/2019  . Abdominal pain 05/18/2019  . Vomiting 05/18/2019  . Intertrochanteric fracture of right hip ([place]) 12/28/2018  .  Closed right hip fracture, initial encounter ([place]) 12/27/2018  . Anemia, chronic disease 01/21/2018  . 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  . Essential hypertension 12/12/2016  . History of peptic ulcer disease   . Ataxia 12/01/2015  . PUD (peptic ulcer disease)   . Melena 11/27/2015  . Upper GI bleed 11/27/2015  . Tobacco abuse 11/27/2015  . Azotemia 11/27/2015  . Right-sided chest pain 11/27/2015  . Dysphagia   . Generalized anxiety disorder 07/24/2015  . Major depression in remission ([place]) 07/24/2015  . Anemia due to GI blood loss 05/27/2014  . Hyperlipemia 03/18/2013  . COPD (chronic obstructive pulmonary disease) ([place]) 01/12/2013  . Osteoporosis 01/12/2013    has No Known Allergies.  MEDICAL HISTORY: Past Medical History:  Diagnosis Date  . Anemia, chronic disease 01/21/2018  . Arthritis   . Asthma   . COPD (chronic obstructive pulmonary disease) ([place])   . Dieulafoy lesion of colon 2006  . Gastric AVM 2014  . Generalized anxiety disorder 07/24/2015  . Hard of hearing   . Hypertension   . Iron deficiency anemia due to chronic blood loss 05/27/2014  . Kidney stone   . Major depression in remission ([place]) 07/24/2015  . Osteoporosis   . PUD (peptic ulcer disease) 11/27/2015   Gastric and duodenal ulcer on EGD  . Skin cancer   . Status post dilation of esophageal narrowing   . Stroke ([place])   . Unsteady gait   . Upper GI bleed 11/27/2015    SURGICAL HISTORY: Past Surgical History:  Procedure Laterality Date  . ABDOMINAL HYSTERECTOMY    . APPENDECTOMY    . COLONOSCOPY  03/2005  Dr. Gala Romney: rectal and rectosigmoid polyps removed  . COLONOSCOPY WITH ESOPHAGOGASTRODUODENOSCOPY (EGD) N/A 08/12/2013   Dr. Gala Romney: colonoscopy with tubular adenoma, colonic diverticulosis. EGD with non-critical Schatzki's ring, non-manipulated, and gastric AVM with evidence of recent bleeding, s/p thermal sealing and hemostasis clip  . ESOPHAGEAL DILATION  11/27/2015   Procedure: ESOPHAGEAL DILATION;   Surgeon: Daneil Dolin, MD;  Location: [facility];  Service: Endoscopy;;  . ESOPHAGOGASTRODUODENOSCOPY  03/2005   Dr. Gala Romney: tiny distal esophageal erosions  . ESOPHAGOGASTRODUODENOSCOPY N/A 11/27/2015   RMR: Cricopharyngeus web, status post dilation. Nonbleeding duodenal and gastric ulcers without stigmata of bleeding. Gastric biopsies benign  . ESOPHAGOGASTRODUODENOSCOPY N/A 02/14/2016   Procedure: ESOPHAGOGASTRODUODENOSCOPY (EGD);  Surgeon: Daneil Dolin, MD;  Location: [facility];  Service: Endoscopy;  Laterality: N/A;  300  . GIVENS CAPSULE STUDY N/A 11/27/2015   NOT DONE, WAS NOT INDICATED AT THE TIME  . INTRAMEDULLARY (IM) NAIL INTERTROCHANTERIC Right 12/28/2018   Procedure: INTRAMEDULLARY (IM) NAIL INTERTROCHANTRIC;  Surgeon: Paralee Cancel, MD;  Location: [facility];  Service: Orthopedics;  Laterality: Right;  . MALONEY DILATION N/A 02/14/2016   Procedure: Venia Minks DILATION;  Surgeon: Daneil Dolin, MD;  Location: [facility];  Service: Endoscopy;  Laterality: N/A;  . small bowel capsule study  03/2005   Dr. Laural Golden: two small Dieulafoy lesions at ascending colon. tiney specks of blood at duodenal bulb but no identified lesions  . TONSILLECTOMY      SOCIAL HISTORY: Social History   Socioeconomic History  . Marital status: Married    Spouse name: Not on file  . Number of children: 3  . Years of education: Not on file  . Highest education level: Not on file  Occupational History  . Occupation: retired from [employer]  . Smoking status: Former Smoker    Packs/day: 0.50    Years: 70.00    Pack years: 35.00    Types: Cigarettes    Quit date: 08/26/2014    Years since quitting: 5.8  . Smokeless tobacco: Never Used  . Tobacco comment: about 1/2 ppd  Vaping Use  . Vaping Use: Never used  Substance and Sexual Activity  . Alcohol use: No    Alcohol/week: 0.0 standard drinks  . Drug use: No  . Sexual activity: Not on file    Comment: married  Other Topics Concern   . Not on file  Social History Narrative   Lives with husband and son   Caffeine use: 1 cup a day of coffee   Diet coke- 16oz daily   Right handed   Social Determinants of Health   Financial Resource Strain:   . Difficulty of Paying Living Expenses: Not on file  Food Insecurity:   . Worried About Charity fundraiser in the Last Year: Not on file  . Ran Out of Food in the Last Year: Not on file  Transportation Needs:   . Lack of Transportation (Medical): Not on file  . Lack of Transportation (Non-Medical): Not on file  Physical Activity:   . Days of Exercise per Week: Not on file  . Minutes of Exercise per Session: Not on file  Stress:   . Feeling of Stress : Not on file  Social Connections:   . Frequency of Communication with Friends and Family: Not on file  . Frequency of Social Gatherings with Friends and Family: Not on file  . Attends Religious Services: Not on file  . Active Member of Clubs or  Organizations: Not on file  . Attends Archivist Meetings: Not on file  . Marital Status: Not on file  Intimate Partner Violence:   . Fear of Current or Ex-Partner: Not on file  . Emotionally Abused: Not on file  . Physically Abused: Not on file  . Sexually Abused: Not on file    FAMILY HISTORY: Family History  Problem Relation Age of Onset  . Hypertension Mother   . Stroke Mother   . Leukemia Father 3  . Lung cancer Father   . Prostate cancer Son   . Melanoma Son   . Squamous cell carcinoma Son   . Heart attack Daughter   . Hypertension Daughter   . Tremor Daughter   . Colon cancer Neg Hx     Review of Systems  Constitutional: Positive for fatigue.  Gastrointestinal: Positive for blood in stool.  All other systems reviewed and are negative.   Vital signs: -Deferred to telephone visit  Physical Exam -Deferred due to telephone visit  LABORATORY DATA:  CBC    Component Value Date/Time   WBC 5.8 07/11/2020 1017   RBC 3.89 07/11/2020 1017   HGB  13.0 07/11/2020 1017   HGB 13.2 08/17/2019 1051   HCT 39.9 07/11/2020 1017   HCT 41.2 08/17/2019 1051   PLT 212 07/11/2020 1017   PLT 232 08/17/2019 1051   MCV 102.6 (H) 07/11/2020 1017   MCV 100 (H) 08/17/2019 1051   MCH 33.4 07/11/2020 1017   MCHC 32.6 07/11/2020 1017   RDW 12.4 07/11/2020 1017   RDW 11.6 (L) 08/17/2019 1051   LYMPHSABS 1.2 07/11/2020 1017   LYMPHSABS 1.2 08/17/2019 1051   MONOABS 0.5 07/11/2020 1017   EOSABS 0.1 07/11/2020 1017   EOSABS 0.1 08/17/2019 1051   BASOSABS 0.1 07/11/2020 1017   BASOSABS 0.1 08/17/2019 1051    CMP     Component Value Date/Time   NA 136 07/11/2020 1017   NA 140 08/17/2019 1051   K 4.3 07/11/2020 1017   CL 102 07/11/2020 1017   CO2 25 07/11/2020 1017   GLUCOSE 134 (H) 07/11/2020 1017   BUN 12 07/11/2020 1017   BUN 10 08/17/2019 1051   CREATININE 0.85 07/11/2020 1017   CREATININE 0.60 06/17/2013 1241   CALCIUM 9.9 07/11/2020 1017   PROT 6.6 07/11/2020 1017   PROT 6.3 08/17/2019 1051   ALBUMIN 3.9 07/11/2020 1017   ALBUMIN 4.4 08/17/2019 1051   AST 21 07/11/2020 1017   ALT 15 07/11/2020 1017   ALKPHOS 63 07/11/2020 1017   BILITOT 1.2 07/11/2020 1017   BILITOT 0.7 08/17/2019 1051   GFRNONAA >60 07/11/2020 1017   GFRAA >60 03/10/2020 1010    All questions were answered to patient's stated satisfaction. Encouraged patient to call with any new concerns or questions before his next visit to the cancer center and we can certain see him sooner, if needed.     ASSESSMENT and THERAPY PLAN:  1.  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 - Last Feraheme infusion was on 03/21/2020 and 04/04/2020. -She occasionally sees dark stools.  However denies any bright red bleeding per rectum. -Son reports she started on oral iron supplementation which she is taking daily. -Patient reports she occasionally gets constipation.  Takes stool softeners as recommended. -Labs done on   07/11/2020 showed a ferritin of 256, hemoglobin of 13 and a platelet count of 212,000. -No additional IV iron needed today. -She will follow-up in  4 months with repeat labs.    Disposition: -RTC in 4 months with repeat lab work (CBC, CMP, ferritin, iron panel, LDH, vitamin D, vitamin B12 ) assessment and possible IV iron  No problem-specific Assessment & Plan notes found for this encounter.   No orders of the defined types were placed in this encounter.   All questions were answered. The patient knows to call the clinic with any problems, questions or concerns. We can certainly see the patient much sooner if necessary. This note was electronically signed.  I provided 28 minutes of non face-to-face telephone visit time during this encounter, and > 50% was spent counseling as documented under my assessment & plan.   Jacquelin Hawking, NP 07/14/2020

## 2020-07-18 ENCOUNTER — Telehealth (HOSPITAL_COMMUNITY): Payer: Medicare Other | Admitting: Nurse Practitioner

## 2020-07-22 ENCOUNTER — Other Ambulatory Visit: Payer: Self-pay | Admitting: Family Medicine

## 2020-07-27 ENCOUNTER — Other Ambulatory Visit (INDEPENDENT_AMBULATORY_CARE_PROVIDER_SITE_OTHER): Payer: Medicare Other

## 2020-07-27 ENCOUNTER — Other Ambulatory Visit: Payer: Self-pay

## 2020-07-27 DIAGNOSIS — D6489 Other specified anemias: Secondary | ICD-10-CM | POA: Diagnosis not present

## 2020-07-27 LAB — POCT HEMOGLOBIN: Hemoglobin: 13.8 g/dL (ref 11–14.6)

## 2020-08-01 ENCOUNTER — Other Ambulatory Visit: Payer: Medicare Other

## 2020-08-12 ENCOUNTER — Other Ambulatory Visit: Payer: Self-pay | Admitting: Family Medicine

## 2020-08-12 DIAGNOSIS — E876 Hypokalemia: Secondary | ICD-10-CM

## 2020-08-12 DIAGNOSIS — D500 Iron deficiency anemia secondary to blood loss (chronic): Secondary | ICD-10-CM

## 2020-08-22 ENCOUNTER — Telehealth: Payer: Self-pay | Admitting: Family Medicine

## 2020-08-22 ENCOUNTER — Other Ambulatory Visit (INDEPENDENT_AMBULATORY_CARE_PROVIDER_SITE_OTHER): Payer: Medicare Other

## 2020-08-22 ENCOUNTER — Other Ambulatory Visit: Payer: Self-pay

## 2020-08-22 DIAGNOSIS — D6489 Other specified anemias: Secondary | ICD-10-CM

## 2020-08-22 LAB — POCT HEMOGLOBIN: Hemoglobin: 13.7 g/dL (ref 11–14.6)

## 2020-08-22 NOTE — Telephone Encounter (Signed)
FYI: Pt here for nurse visit to check hemoglobin. Son,Mike, mentioned that she had become short of breath yesterday. All other vitals have been good. Oxygen has been 92-93%. Palliative nurse is now coming out once a month, other nurses coming out at least every 2 weeks.

## 2020-08-23 NOTE — Telephone Encounter (Signed)
Pt son Kathlene November was just wanting to make [place] aware. Son states that if shortness of breath worsens he will call the office for evaluation.

## 2020-08-23 NOTE — Telephone Encounter (Signed)
Nurses Please connect with Kathlene November Find out from him is he interested in having further evaluation of this shortness of breath or just wanting to allow [place] to be aware of this?  Also it somewhat depends on his shortness of breath severe are there other symptoms going on such as cough fever being sick? Happy to help out but I would need to fit her into the schedule over the next couple days also he would need to let us know if he is wanting an evaluation does he want this done as a phone consultation or in person evaluation outside at his car?

## 2020-08-28 ENCOUNTER — Telehealth: Payer: Self-pay | Admitting: Family Medicine

## 2020-08-28 NOTE — Telephone Encounter (Signed)
Wednesday either at 11:50 AM or 2 PM

## 2020-08-28 NOTE — Telephone Encounter (Signed)
Patient has had diarrhea for 3 days now with a bad smell and black this morning its tanish-brown not as loose. No fever and no other symptoms.

## 2020-08-28 NOTE — Telephone Encounter (Signed)
Nurses Please call family If having black tarry stools then needs to be evaluated obviously if having severe bloody stools needs to go to ER  Please discuss with the family the severity of this. They also need to have stool iFOB OT if not feasible to be evaluated

## 2020-08-28 NOTE — Telephone Encounter (Signed)
Discussed with pt's son and he states today the stool is tanish brown but has had some black smelly diarrhea when it first started 3 days ago. States she is doing well and wanted appt tomorrow or Wednesday. Please call and schedule pt appt tues or Wednesday with dr Lorin Picket.

## 2020-08-30 ENCOUNTER — Other Ambulatory Visit: Payer: Self-pay

## 2020-08-30 ENCOUNTER — Ambulatory Visit: Payer: Medicare Other | Admitting: Family Medicine

## 2020-08-30 ENCOUNTER — Encounter: Payer: Self-pay | Admitting: Family Medicine

## 2020-08-30 VITALS — BP 110/70 | HR 77 | Temp 96.1°F

## 2020-08-30 DIAGNOSIS — K921 Melena: Secondary | ICD-10-CM | POA: Diagnosis not present

## 2020-08-30 LAB — POCT HEMOGLOBIN: Hemoglobin: 12.8 g/dL (ref 11–14.6)

## 2020-08-30 NOTE — Progress Notes (Signed)
   Subjective:    Patient ID: Carol Massey, female    DOB: [birth year], 87 y.o.   MRN: [id number]  HPI Pt had dark stools, black in color with bad odor. Friday Saturday and Sunday was diarrhea. Monday loose stool. Then stools turned loose and tan in color. Pt will go a few days without going and then will have "massive BM". Son did call Home Health nurse.  Pt also states she gives out of breath when talking.  Patient is getting older At times gets out of breath with activity No rectal bleeding is best that the son can tell Bowel movements are starting to become more formed at this point Results for orders placed or performed in visit on 08/30/20  POCT hemoglobin  Result Value Ref Range   Hemoglobin 12.8 11 - 14.6 g/dL    Review of Systems     Objective:   Physical Exam Lungs clear respiratory rate normal heart regular pulse normal extremities no edema hemoglobin good       Assessment & Plan:  Diarrhea-history of black tarry stools but this is resolved-seems to be resolving hard to tell if this was truly GI bleed or not hemoglobin is stable check hemoglobin again in 3 to 4 weeks in addition to this stool test for blood hold off on any type of scans at this point follow-up if ongoing troubles or problems I find no evidence of instability or surgical issue going on currently

## 2020-09-02 ENCOUNTER — Other Ambulatory Visit: Payer: Self-pay | Admitting: Family Medicine

## 2020-09-19 ENCOUNTER — Other Ambulatory Visit: Payer: Self-pay

## 2020-09-19 ENCOUNTER — Other Ambulatory Visit (INDEPENDENT_AMBULATORY_CARE_PROVIDER_SITE_OTHER): Payer: Medicare Other | Admitting: *Deleted

## 2020-09-19 DIAGNOSIS — D649 Anemia, unspecified: Secondary | ICD-10-CM

## 2020-09-19 LAB — POCT HEMOGLOBIN: Hemoglobin: 12.1 g/dL (ref 11–14.6)

## 2020-09-23 ENCOUNTER — Other Ambulatory Visit: Payer: Self-pay | Admitting: Family Medicine

## 2020-10-03 ENCOUNTER — Other Ambulatory Visit: Payer: Self-pay

## 2020-10-03 DIAGNOSIS — K921 Melena: Secondary | ICD-10-CM

## 2020-10-03 LAB — IFOBT (OCCULT BLOOD): IFOBT: NEGATIVE

## 2020-10-17 ENCOUNTER — Other Ambulatory Visit: Payer: Self-pay

## 2020-10-17 ENCOUNTER — Other Ambulatory Visit (INDEPENDENT_AMBULATORY_CARE_PROVIDER_SITE_OTHER): Payer: Medicare Other | Admitting: *Deleted

## 2020-10-17 DIAGNOSIS — D500 Iron deficiency anemia secondary to blood loss (chronic): Secondary | ICD-10-CM

## 2020-10-17 LAB — POCT HEMOGLOBIN: Hemoglobin: 14.4 g/dL (ref 11–14.6)

## 2020-10-17 NOTE — Progress Notes (Signed)
   Subjective:    Patient ID: Carol Massey, female    DOB: 06-13-1926, 87 y.o.   MRN: [id number]  HPI Results for orders placed or performed in visit on 10/17/20  POCT hemoglobin  Result Value Ref Range   Hemoglobin 14.4 11 - 14.6 g/dL    Consult with Dr Nicki Reaper: Hgb Good follow up with Dr Nicki Reaper in April. Son(DPR) verbalized understanding and stated he will check his calender and schedule follow up office visit Review of Systems     Objective:   Physical Exam        Assessment & Plan:

## 2020-10-20 ENCOUNTER — Other Ambulatory Visit: Payer: Self-pay | Admitting: Family Medicine

## 2020-11-09 ENCOUNTER — Other Ambulatory Visit (HOSPITAL_COMMUNITY): Payer: Self-pay

## 2020-11-09 DIAGNOSIS — D500 Iron deficiency anemia secondary to blood loss (chronic): Secondary | ICD-10-CM

## 2020-11-09 DIAGNOSIS — E538 Deficiency of other specified B group vitamins: Secondary | ICD-10-CM

## 2020-11-10 ENCOUNTER — Encounter (HOSPITAL_COMMUNITY): Payer: Self-pay | Admitting: *Deleted

## 2020-11-10 ENCOUNTER — Other Ambulatory Visit: Payer: Self-pay

## 2020-11-10 ENCOUNTER — Inpatient Hospital Stay (HOSPITAL_COMMUNITY): Payer: Medicare Other | Attending: Hematology

## 2020-11-10 ENCOUNTER — Emergency Department (HOSPITAL_COMMUNITY)
Admission: EM | Admit: 2020-11-10 | Discharge: 2020-11-10 | Disposition: A | Discharge status: Home / Self Care | Payer: Medicare Other | Attending: Emergency Medicine | Admitting: Emergency Medicine

## 2020-11-10 DIAGNOSIS — X58XXXA Exposure to other specified factors, initial encounter: Secondary | ICD-10-CM | POA: Diagnosis not present

## 2020-11-10 DIAGNOSIS — J449 Chronic obstructive pulmonary disease, unspecified: Secondary | ICD-10-CM | POA: Diagnosis not present

## 2020-11-10 DIAGNOSIS — S5012XA Contusion of left forearm, initial encounter: Secondary | ICD-10-CM | POA: Diagnosis not present

## 2020-11-10 DIAGNOSIS — I10 Essential (primary) hypertension: Secondary | ICD-10-CM | POA: Diagnosis not present

## 2020-11-10 DIAGNOSIS — J45909 Unspecified asthma, uncomplicated: Secondary | ICD-10-CM | POA: Insufficient documentation

## 2020-11-10 DIAGNOSIS — D500 Iron deficiency anemia secondary to blood loss (chronic): Secondary | ICD-10-CM

## 2020-11-10 DIAGNOSIS — Z862 Personal history of diseases of the blood and blood-forming organs and certain disorders involving the immune mechanism: Secondary | ICD-10-CM | POA: Insufficient documentation

## 2020-11-10 DIAGNOSIS — S4992XA Unspecified injury of left shoulder and upper arm, initial encounter: Secondary | ICD-10-CM | POA: Diagnosis present

## 2020-11-10 DIAGNOSIS — Z79899 Other long term (current) drug therapy: Secondary | ICD-10-CM | POA: Diagnosis not present

## 2020-11-10 DIAGNOSIS — E538 Deficiency of other specified B group vitamins: Secondary | ICD-10-CM

## 2020-11-10 DIAGNOSIS — S40022A Contusion of left upper arm, initial encounter: Secondary | ICD-10-CM

## 2020-11-10 DIAGNOSIS — Z87891 Personal history of nicotine dependence: Secondary | ICD-10-CM | POA: Insufficient documentation

## 2020-11-10 LAB — FERRITIN: Ferritin: 130 ng/mL (ref 11–307)

## 2020-11-10 LAB — COMPREHENSIVE METABOLIC PANEL
ALT: 13 U/L (ref 0–44)
AST: 22 U/L (ref 15–41)
Albumin: 4 g/dL (ref 3.5–5.0)
Alkaline Phosphatase: 55 U/L (ref 38–126)
Anion gap: 11 (ref 5–15)
BUN: 9 mg/dL (ref 8–23)
CO2: 23 mmol/L (ref 22–32)
Calcium: 10 mg/dL (ref 8.9–10.3)
Chloride: 102 mmol/L (ref 98–111)
Creatinine, Ser: 0.82 mg/dL (ref 0.44–1.00)
GFR, Estimated: >60 mL/min (ref >60)
Glucose, Bld: 98 mg/dL (ref 70–99)
Potassium: 4 mmol/L (ref 3.5–5.1)
Sodium: 136 mmol/L (ref 135–145)
Total Bilirubin: 1 mg/dL (ref 0.3–1.2)
Total Protein: 7 g/dL (ref 6.5–8.1)

## 2020-11-10 LAB — CBC WITH DIFFERENTIAL/PLATELET
Abs Immature Granulocytes: 0.02 10*3/uL (ref 0.00–0.07)
Basophils Absolute: 0.1 10*3/uL (ref 0.0–0.1)
Basophils Relative: 1 %
Eosinophils Absolute: 0.1 10*3/uL (ref 0.0–0.5)
Eosinophils Relative: 2 %
HCT: 39.6 % (ref 36.0–46.0)
Hemoglobin: 13.2 g/dL (ref 12.0–15.0)
Immature Granulocytes: 0 %
Lymphocytes Relative: 24 %
Lymphs Abs: 1.3 10*3/uL (ref 0.7–4.0)
MCH: 33.3 pg (ref 26.0–34.0)
MCHC: 33.3 g/dL (ref 30.0–36.0)
MCV: 100 fL (ref 80.0–100.0)
Monocytes Absolute: 0.6 10*3/uL (ref 0.1–1.0)
Monocytes Relative: 10 %
Neutro Abs: 3.5 10*3/uL (ref 1.7–7.7)
Neutrophils Relative %: 63 %
Platelets: 234 10*3/uL (ref 150–400)
RBC: 3.96 MIL/uL (ref 3.87–5.11)
RDW: 12.5 % (ref 11.5–15.5)
WBC: 5.5 10*3/uL (ref 4.0–10.5)
nRBC: 0 % (ref 0.0–0.2)

## 2020-11-10 LAB — IRON AND TIBC
Iron: 82 ug/dL (ref 28–170)
Saturation Ratios: 30 % (ref 10.4–31.8)
TIBC: 278 ug/dL (ref 250–450)
UIBC: 196 ug/dL

## 2020-11-10 NOTE — ED Notes (Addendum)
Entered room and introduced self to patient. Pt appears to be resting in bed, respirations are even and unlabored with equal chest rise and fall. Bed is locked in the lowest position, side rails x2, call bell within reach. Pt educated on call light use and hourly rounding, verbalized understanding and in agreement at this time. All questions and concerns voiced addressed. Refreshments offered and provided per patient request.  

## 2020-11-10 NOTE — ED Provider Notes (Signed)
[facility] EMERGENCY DEPARTMENT Provider Note   CSN: [id number] Arrival date & time: 11/10/20  1533     History Chief Complaint  Patient presents with  . Arm Pain    Carol Massey is a 87 y.o. female.  HPI      ARABELLE Massey is a 87 y.o. female with history of iron deficiency anemia who presents to the Emergency Department with her son who requests evaluation of discoloration and swelling at the site of a needlestick that occurred this morning.  Patient's son states that she was here earlier this morning for routine labs ordered by her oncologist.  There was bleeding at the site at the time of the needlestick.  A dressing was placed and when he removed the dressing around 3 PM, he noticed bruising and mild swelling.  Patient complains of ongoing left upper arm pain and neck pain secondary to cervical nerve root impingement, but denies any new or worsening pain of her arm.  No elbow,  lower arm pain, or pain at the needlestick site.  She does not take anticoagulants.    Past Medical History:  Diagnosis Date  . Anemia, chronic disease 01/21/2018  . Arthritis   . Asthma   . COPD (chronic obstructive pulmonary disease) ([place])   . Dieulafoy lesion of colon 2006  . Gastric AVM 2014  . Generalized anxiety disorder 07/24/2015  . Hard of hearing   . Hypertension   . Iron deficiency anemia due to chronic blood loss 05/27/2014  . Kidney stone   . Major depression in remission ([place]) 07/24/2015  . Osteoporosis   . PUD (peptic ulcer disease) 11/27/2015   Gastric and duodenal ulcer on EGD  . Skin cancer   . Status post dilation of esophageal narrowing   . Stroke ([place])   . Unsteady gait   . Upper GI bleed 11/27/2015    Patient Active Problem List   Diagnosis Date Noted  . LUQ abdominal pain 09/22/2019  . Nausea without vomiting 09/22/2019  . Belching 09/22/2019  . Constipation 05/18/2019  . Abdominal pain 05/18/2019  . Vomiting 05/18/2019  . Intertrochanteric fracture of right hip  ([place]) 12/28/2018  . Closed right hip fracture, initial encounter ([place]) 12/27/2018  . Anemia, chronic disease 01/21/2018  . 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  . Essential hypertension 12/12/2016  . History of peptic ulcer disease   . Ataxia 12/01/2015  . PUD (peptic ulcer disease)   . Melena 11/27/2015  . Upper GI bleed 11/27/2015  . Tobacco abuse 11/27/2015  . Azotemia 11/27/2015  . Right-sided chest pain 11/27/2015  . Dysphagia   . Generalized anxiety disorder 07/24/2015  . Major depression in remission ([place]) 07/24/2015  . Anemia due to GI blood loss 05/27/2014  . Hyperlipemia 03/18/2013  . COPD (chronic obstructive pulmonary disease) ([place]) 01/12/2013  . Osteoporosis 01/12/2013    Past Surgical History:  Procedure Laterality Date  . ABDOMINAL HYSTERECTOMY    . APPENDECTOMY    . COLONOSCOPY  03/2005   Dr. Gala Romney: rectal and rectosigmoid polyps removed  . COLONOSCOPY WITH ESOPHAGOGASTRODUODENOSCOPY (EGD) N/A 08/12/2013   Dr. Gala Romney: colonoscopy with tubular adenoma, colonic diverticulosis. EGD with non-critical Schatzki's ring, non-manipulated, and gastric AVM with evidence of recent bleeding, s/p thermal sealing and hemostasis clip  . ESOPHAGEAL DILATION  11/27/2015   Procedure: ESOPHAGEAL DILATION;  Surgeon: Daneil Dolin, MD;  Location: [facility] ENDO SUITE;  Service: Endoscopy;;  . ESOPHAGOGASTRODUODENOSCOPY  03/2005   Dr. Gala Romney: tiny distal esophageal erosions  . ESOPHAGOGASTRODUODENOSCOPY  N/A 11/27/2015   RMR: Cricopharyngeus web, status post dilation. Nonbleeding duodenal and gastric ulcers without stigmata of bleeding. Gastric biopsies benign  . ESOPHAGOGASTRODUODENOSCOPY N/A 02/14/2016   Procedure: ESOPHAGOGASTRODUODENOSCOPY (EGD);  Surgeon: Daneil Dolin, MD;  Location: [facility] ENDO SUITE;  Service: Endoscopy;  Laterality: N/A;  300  . GIVENS CAPSULE STUDY N/A 11/27/2015   NOT DONE, WAS NOT INDICATED AT THE TIME  . INTRAMEDULLARY (IM) NAIL INTERTROCHANTERIC Right 12/28/2018   Procedure:  INTRAMEDULLARY (IM) NAIL INTERTROCHANTRIC;  Surgeon: Paralee Cancel, MD;  Location: [facility] ORS;  Service: Orthopedics;  Laterality: Right;  . MALONEY DILATION N/A 02/14/2016   Procedure: Venia Minks DILATION;  Surgeon: Daneil Dolin, MD;  Location: [facility] ENDO SUITE;  Service: Endoscopy;  Laterality: N/A;  . small bowel capsule study  03/2005   Dr. Laural Golden: two small Dieulafoy lesions at ascending colon. tiney specks of blood at duodenal bulb but no identified lesions  . TONSILLECTOMY       OB History   No obstetric history on file.     Family History  Problem Relation Age of Onset  . Hypertension Mother   . Stroke Mother   . Leukemia Father 41  . Lung cancer Father   . Prostate cancer Son   . Melanoma Son   . Squamous cell carcinoma Son   . Heart attack Daughter   . Hypertension Daughter   . Tremor Daughter   . Colon cancer Neg Hx     Social History   Tobacco Use  . Smoking status: Former Smoker    Packs/day: 0.50    Years: 70.00    Pack years: 35.00    Types: Cigarettes    Quit date: 08/26/2014    Years since quitting: 6.2  . Smokeless tobacco: Never Used  . Tobacco comment: about 1/2 ppd  Vaping Use  . Vaping Use: Never used  Substance Use Topics  . Alcohol use: No    Alcohol/week: 0.0 standard drinks  . Drug use: No    Home Medications Prior to Admission medications   Medication Sig Start Date End Date Taking? Authorizing Provider  Acetaminophen (TYLENOL EXTRA STRENGTH PO) Take by mouth. prn    [provider]  albuterol (PROVENTIL HFA;VENTOLIN HFA) 108 (90 Base) MCG/ACT inhaler INHALE 2 PUFFS INTO THE LUNGS EVERY 4 HOURS AS NEEDED FOR WHEEZING OR SHORTNESS OF BREATH. 02/09/18   [name], MD  ALPRAZolam (XANAX) 0.25 MG tablet TAKE 1/2 TABLET BY MOUTH IN THE MORNING AND 1 TO 2 TABLETS AT BEDTIME AS NEEDED FOR SLEEP. 09/25/20   [name], MD  amLODipine (NORVASC) 2.5 MG tablet TAKE 1 TABLET BY MOUTH ONCE A DAY. 02/22/20   [name], MD  Ascorbic Acid  (VITAMIN C) 1000 MG tablet Take 1,000 mg by mouth daily.    [provider]  Calcium Carbonate Antacid (TUMS PO) Take by mouth. 3 -4 a day    [provider]  citalopram (CELEXA) 10 MG tablet TAKE ONE TABLET BY MOUTH DAILY. 09/04/20   [name], MD  Cyanocobalamin (VITAMIN B 12 PO) Take 1,000 mcg by mouth every other day.     [provider]  dicyclomine (BENTYL) 10 MG capsule Take one tid prn for abdominal cramps 08/04/19   [name], MD  meclizine (ANTIVERT) 25 MG tablet Take one every 8 hours prn vertigo. Caution drowiness Patient taking differently: 12.5 mg. Takes 1/2 tab qhs  prn vertigo. Causes drowiness 11/29/19   [name], MD  mupirocin ointment (BACTROBAN) 2 % Apply 1-2x per day to affected area 06/19/20   Luking, Elayne Snare, MD  ondansetron (ZOFRAN ODT) 4 MG disintegrating tablet Take 1 tablet (4 mg total) by mouth every 8 (eight) hours as needed for nausea or vomiting. 04/16/19   Mikey Kirschner, MD  OVER THE COUNTER MEDICATION Vit C one daily One a day multi vitamin Calcium 600mg  + D  Vit b12 1,000 mcg. One every other day    [provider]  OVER THE COUNTER MEDICATION Multivitamin qd    [provider]  OVER THE COUNTER MEDICATION Calcium 600mg  plus Vit D3 75mcg qd    [provider]  pantoprazole (PROTONIX) 40 MG tablet TAKE (1) TABLET BY MOUTH TWICE DAILY. 09/04/20   [name], MD  potassium chloride SA (KLOR-CON) 20 MEQ tablet TAKE 1 TABLET BY MOUTH ONCE DAILY. 08/14/20   [name], MD  Probiotic Product (PROBIOTIC PO) Take 1 capsule by mouth daily. "10 Strands"    [provider]  SPIRIVA HANDIHALER 18 MCG inhalation capsule INHALE 1 PUFF INTO THE LUNGS DAILY 10/20/20   Sallee Lange A, MD  sucralfate (CARAFATE) 1 g tablet TAKE (1) TABLET BY MOUTH THREE TIMES DAILY AS NEEDED. MAY BREAK UP AND MIX IN WATER. 03/23/20   [name], MD  SYMBICORT 80-4.5 MCG/ACT inhaler INHALE 2 PUFFS INTO THE  LUNGS TWICE DAILY. 10/20/20   [name], MD    Allergies    Patient has no known allergies.  Review of Systems   Review of Systems  Constitutional: Negative for chills, fatigue and fever.  HENT: Negative for sore throat and trouble swallowing.   Respiratory: Negative for cough, shortness of breath and wheezing.   Cardiovascular: Negative for chest pain and palpitations.  Gastrointestinal: Negative for abdominal pain, blood in stool, nausea and vomiting.  Genitourinary: Negative for dysuria, flank pain and hematuria.  Musculoskeletal: Negative for arthralgias, back pain, myalgias, neck pain and neck stiffness.  Skin: Negative for rash.  Neurological: Negative for dizziness, weakness and numbness.  Hematological: Does not bruise/bleed easily.    Physical Exam Updated Vital Signs BP 120/64 (BP Location: Right Arm)   Pulse 75   Temp 98.2 F (36.8 C) (Oral)   Resp 18   Ht 4\' 8"  (1.422 m)   Wt 47.6 kg   LMP  (LMP Unknown)   SpO2 96%   BMI 23.54 kg/m   Physical Exam Constitutional:      Appearance: Normal appearance. She is not ill-appearing.  HENT:     Head: Normocephalic.  Neck:     Thyroid: No thyromegaly.     Meningeal: Kernig's sign absent.  Cardiovascular:     Rate and Rhythm: Normal rate and regular rhythm.     Pulses: Normal pulses.  Pulmonary:     Effort: Pulmonary effort is normal.     Breath sounds: Normal breath sounds. No wheezing.  Abdominal:     Palpations: Abdomen is soft.     Tenderness: There is no abdominal tenderness.  Musculoskeletal:        General: Normal range of motion.     Cervical back: Normal range of motion and neck supple.     Comments: 4 cm area of ecchymosis and dime sized hematoma at the left antecubital fossa.  No active bleeding, excessive warmth, or erythema of the left arm.  No lymphangitis.  Skin:    General: Skin is warm.     Capillary Refill: Capillary refill  takes less than 2 seconds.     Findings: No rash.   Neurological:     General: No focal deficit present.     Mental Status: She is alert.     Sensory: No sensory deficit.     Motor: No weakness.     ED Results / Procedures / Treatments   Labs (all labs ordered are listed, but only abnormal results are displayed) Labs Reviewed - No data to display  EKG None  Radiology No results found.  Procedures Procedures   Medications Ordered in ED Medications - No data to display  ED Course  I have reviewed the triage vital signs and the nursing notes.  Pertinent labs & imaging results that were available during my care of the patient were reviewed by me and considered in my medical decision making (see chart for details).    MDM Rules/Calculators/A&P                          Patient here for evaluation of discoloration and dime sized area of swelling at the site of a needlestick earlier today.  She has history of iron deficient anemia and labs drawn today for evaluation of iron and CBC.  On exam, area is nontender, neurovascularly intact.  She has a small amount of bruising to the area.  Clinically, there is low clinical suspicion for DVT or thrombophlebitis.  Symptoms are likely related to traumatic blood draw.  Patient and family reassured.  Mild pressure dressing applied.  Patient appears appropriate for discharge home, given return precautions.   Final Clinical Impression(s) / ED Diagnoses Final diagnoses:  Hematoma of arm, left, initial encounter    Rx / DC Orders ED Discharge Orders    None       Kem Parkinson, PA-C 11/10/20 1727    Noemi Chapel, MD 11/12/20 [phone]

## 2020-11-10 NOTE — Discharge Instructions (Signed)
She has some bruising to her left arm from the blood draw.  This will improve, but may take several weeks for the bruising to go away.  A mild pressure dressing will help improve swelling.  You can elevate her arm this evening on a pillow.  Follow-up with her primary care provider for recheck or return emergency department if she develops worsening symptoms such as increasing pain, redness or swelling of her arm.

## 2020-11-10 NOTE — ED Notes (Signed)
ED Provider at bedside. 

## 2020-11-10 NOTE — ED Triage Notes (Signed)
Pt's family member reports pt was giving blood this morning and afterwards the needle site starting swelling and "turning colors". Pt c/o the area stinging.

## 2020-11-11 ENCOUNTER — Other Ambulatory Visit: Payer: Self-pay | Admitting: Family Medicine

## 2020-11-11 DIAGNOSIS — E876 Hypokalemia: Secondary | ICD-10-CM

## 2020-11-11 DIAGNOSIS — D500 Iron deficiency anemia secondary to blood loss (chronic): Secondary | ICD-10-CM

## 2020-11-13 ENCOUNTER — Other Ambulatory Visit: Payer: Self-pay | Admitting: Family Medicine

## 2020-11-13 ENCOUNTER — Telehealth: Payer: Self-pay

## 2020-11-13 NOTE — Telephone Encounter (Signed)
Transition Care Management Follow-up Telephone Call  Date of discharge and from where: 11/10/2020 from [facility]  How have you been since you were released from the hospital? Spoke to pt son and he stated that she is feeling well.   Any questions or concerns? No  Items Reviewed:  Did the pt receive and understand the discharge instructions provided? Yes   Medications obtained and verified? Yes   Other? No   Any new allergies since your discharge? No   Dietary orders reviewed? n/a  Do you have support at home? Yes   Functional Questionnaire: (I = Independent and D = Dependent) ADLs: I  Bathing/Dressing- I  Meal Prep- I  Eating- I  Maintaining continence- I  Transferring/Ambulation- D - pt is wheel chair bound.   Managing Meds- I   Follow up appointments reviewed:   PCP Hospital f/u appt confirmed? No    Specialist Hospital f/u appt confirmed? Yes  Scheduled to see Derek Jack, MD on 11/17/2020 @ 09:30am.  Are transportation arrangements needed? No   If their condition worsens, is the pt aware to call PCP or go to the Emergency Dept.? Yes  Was the patient provided with contact information for the PCP's office or ED? Yes  Was to pt encouraged to call back with questions or concerns? Yes

## 2020-11-14 ENCOUNTER — Telehealth (HOSPITAL_COMMUNITY): Payer: Medicare Other | Admitting: Hematology

## 2020-11-17 ENCOUNTER — Inpatient Hospital Stay (HOSPITAL_BASED_OUTPATIENT_CLINIC_OR_DEPARTMENT_OTHER): Payer: Medicare Other | Admitting: Hematology

## 2020-11-17 ENCOUNTER — Other Ambulatory Visit: Payer: Self-pay

## 2020-11-17 DIAGNOSIS — D500 Iron deficiency anemia secondary to blood loss (chronic): Secondary | ICD-10-CM | POA: Diagnosis not present

## 2020-11-17 NOTE — Progress Notes (Signed)
Virtual Visit via Telephone Note  I connected with Carol Massey on 11/17/20 at  9:30 AM EDT by telephone and verified that I am speaking with the correct person using two identifiers.  Location: Patient: At home Provider: At home   I discussed the limitations, risks, security and privacy concerns of performing an evaluation and management service by telephone and the availability of in person appointments. I also discussed with the patient that there may be a patient responsible charge related to this service. The patient expressed understanding and agreed to proceed.   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  She has been receiving intermittent iron infusions.   Observations/Objective: I have talked to her son [name] over the phone who is with her mother.  She does not have any bleeding per rectum or melena.  She is not taking any iron tablets as it causes her constipation.  Overall her energy levels are good.  She is walking with the help of walker and does some exercises daily.  Assessment and Plan:  1.  Iron deficiency anemia: -Etiology-multiple gastrointestinal AVMs -Last Feraheme infusion on 03/22/1999 2110 2021. -Reviewed labs from 11/10/2020.  Ferritin is 130, down from 256 4 months ago.  Hemoglobin 13.2.  Renal and hepatic function panel was normal. -She does not require any parenteral iron therapy. -We will plan to check her CBC, ferritin, iron panel, C14 and folic acid and 3 months. -If ferritin drops below 100, will consider Feraheme again.   Follow Up Instructions: RTC 3 months with labs.   I discussed the assessment and treatment plan with the patient. The patient was provided an opportunity to ask questions and all were answered. The patient agreed with the plan and demonstrated an understanding of the instructions.   The patient was advised to  call back or seek an in-person evaluation if the symptoms worsen or if the condition fails to improve as anticipated.  I provided 12 minutes of non-face-to-face time during this encounter.   Derek Jack, MD

## 2020-11-18 ENCOUNTER — Other Ambulatory Visit: Payer: Self-pay | Admitting: Family Medicine

## 2020-11-20 NOTE — Telephone Encounter (Signed)
Please touch base with her son [name] regarding already utilizing this medicine every day or just on a as needed basis?  If so how often?

## 2020-11-22 ENCOUNTER — Other Ambulatory Visit: Payer: Self-pay | Admitting: *Deleted

## 2020-11-22 ENCOUNTER — Ambulatory Visit (INDEPENDENT_AMBULATORY_CARE_PROVIDER_SITE_OTHER): Payer: Medicare Other | Admitting: Family Medicine

## 2020-11-22 ENCOUNTER — Other Ambulatory Visit: Payer: Self-pay

## 2020-11-22 ENCOUNTER — Telehealth: Payer: Self-pay

## 2020-11-22 VITALS — HR 87 | Temp 98.7°F | Ht <= 58 in

## 2020-11-22 DIAGNOSIS — D6489 Other specified anemias: Secondary | ICD-10-CM | POA: Diagnosis not present

## 2020-11-22 DIAGNOSIS — F325 Major depressive disorder, single episode, in full remission: Secondary | ICD-10-CM | POA: Diagnosis not present

## 2020-11-22 DIAGNOSIS — J431 Panlobular emphysema: Secondary | ICD-10-CM | POA: Diagnosis not present

## 2020-11-22 LAB — POCT HEMOGLOBIN: Hemoglobin: 12.7 g/dL (ref 11–14.6)

## 2020-11-22 MED ORDER — SUCRALFATE 1 G PO TABS: ORAL_TABLET | ORAL | 3 refills | Status: DC

## 2020-11-22 NOTE — Telephone Encounter (Signed)
Doing the refills is good enough thank you

## 2020-11-22 NOTE — Telephone Encounter (Signed)
Refills sent. Tried to call no answer to let son mike know

## 2020-11-22 NOTE — Telephone Encounter (Signed)
Give 3 refills

## 2020-11-22 NOTE — Progress Notes (Signed)
   Subjective:    Patient ID: Carol Massey, female    DOB: 1926-06-22, 87 y.o.   MRN: [id number]  HPI  Patient being seen for anemia and hemoglobin check and check up patient has a history of GI bleeds Has been seen by gastroenterology Is followed by hematology We follow the hemoglobin fairly closely to avoid any type of unforeseen problems Anemia due to other cause, not classified - Plan: POCT hemoglobin  Panlobular emphysema ([place]), Chronic  Major depressive disorder, single episode, in full remission ([place]), Chronic  She is having progressive weakness.  Eating okay.  Able to ambulate with a walker but limited amount.  Gets out of breath very quickly.  No chest pain or tightness with this.  Worsening condition. Review of Systems See above some shortness of breath no fevers no hemoptysis no rectal bleed    Objective:   Physical Exam Lungs are clear heart regular pulse normal HEENT benign       Assessment & Plan:  1. Anemia due to other cause, not classified Hemoglobin looks good today because she has had GI bleeds in the past her son is most comfortable because of her debilitated state to have this checked on a monthly basis every 3 months she will see [place] every month she will come here for a fingerstick hemoglobin - POCT hemoglobin  2. Panlobular emphysema (H[facility]) Lungs are stable.  This is due to smoking.  Her O2 saturations are good but she gets short of breath with minimal activity.  Her long-term prognosis is poor she is DNR.  Hospice.  3. Major depressive disorder, single episode, in full remission ([place]) Depression under good control currently but tolerating the medicine she does have a lot of anxiety because of her debilitated state Xanax is used at a low-dose half tablet middle of the day in the tablet in the evening  Follow-up 3 months sooner if she needs [place]

## 2020-11-22 NOTE — Telephone Encounter (Signed)
Pt needs refill on sucralfate (CARAFATE) 1 g tablet this needs to be sent to [facility] pt had a RX moved to [facility] back [place]

## 2020-11-27 ENCOUNTER — Other Ambulatory Visit: Payer: Self-pay | Admitting: Family Medicine

## 2020-11-27 NOTE — Telephone Encounter (Signed)
6 months planes

## 2020-12-19 ENCOUNTER — Other Ambulatory Visit: Payer: Self-pay

## 2020-12-19 ENCOUNTER — Other Ambulatory Visit (INDEPENDENT_AMBULATORY_CARE_PROVIDER_SITE_OTHER): Payer: Medicare Other | Admitting: *Deleted

## 2020-12-19 DIAGNOSIS — D6489 Other specified anemias: Secondary | ICD-10-CM | POA: Diagnosis not present

## 2020-12-19 LAB — POCT HEMOGLOBIN: Hemoglobin: 13.4 g/dL (ref 11–14.6)

## 2020-12-19 NOTE — Progress Notes (Signed)
Patient arrives for a hemoglobin check. Results for orders placed or performed in visit on 12/19/20  POCT hemoglobin  Result Value Ref Range   Hemoglobin 13.4 11 - 14.6 g/dL   Consult with Dr Nicki Reaper -Dr Nicki Reaper stated the hemoglobin was good and recheck in 6- 8 weeks- sooner if issues. Discussed with son(DPR and caregiver) who verbalized understanding and stated he will call to schedule recheck in 6 weeks.

## 2021-01-01 ENCOUNTER — Other Ambulatory Visit: Payer: Self-pay | Admitting: Family Medicine

## 2021-01-08 ENCOUNTER — Other Ambulatory Visit: Payer: Self-pay | Admitting: Family Medicine

## 2021-01-10 ENCOUNTER — Telehealth: Payer: Self-pay

## 2021-01-10 NOTE — Telephone Encounter (Signed)
Does there seem to be any other associated issues? Is this after eating?  Is she getting chills?  Does she relate the reason she is screening? Hallucinating? How "with it" is she? Her bowels are moving? Any other physical issues? May be hard to figure out.

## 2021-01-10 NOTE — Telephone Encounter (Signed)
Last week patient had 2 episodes where she screamed out loud for about 10 seconds and stopped. She just had a 3rd incident of this about 30 minutes ago and son is concerned. Vitals have been good. Please advise

## 2021-01-10 NOTE — Telephone Encounter (Signed)
Pt son called in said last week she is screaming out loud for maybe 10 sec last week she did twice on one day and then 30 mins ago she done this. Son is concerned all vitals are good.  Pt call back [phone]

## 2021-01-10 NOTE — Telephone Encounter (Signed)
She will seem to be napping in her chair and will scream out and wake up and talk about parents- no other symptoms at all. But this is out of character for her- never done before last week. Has seem people who were not there in the past esp when sleeping or at night for years since her hip fracture but they normally don't bother her because she knows she is "not real" Son was just concerned and wanted to make sure this not a precursor to something- no physical symptoms and vitals fine

## 2021-01-11 NOTE — Telephone Encounter (Signed)
Per Drs message ,  We can arrange for a home visit next week if they are interested   If so it would be at the end of the day in the ballpark of 445-5:00 either Monday Tuesday

## 2021-01-11 NOTE — Telephone Encounter (Signed)
It is hard to know what is causing these but if she is acting fine at other times more than likely this is potentially related to abdominal cramping or some other issue causing the pain  We can arrange for a home visit next week if they are interested  If so it would be at the end of the day in the ballpark of 445-5:00 either Monday Tuesday

## 2021-01-12 NOTE — Telephone Encounter (Signed)
Made appt for Tuesday at 5:00

## 2021-01-16 ENCOUNTER — Ambulatory Visit: Payer: Medicare Other | Admitting: Family Medicine

## 2021-01-16 ENCOUNTER — Other Ambulatory Visit: Payer: Self-pay

## 2021-01-25 ENCOUNTER — Other Ambulatory Visit (INDEPENDENT_AMBULATORY_CARE_PROVIDER_SITE_OTHER): Payer: Medicare Other | Admitting: *Deleted

## 2021-01-25 ENCOUNTER — Other Ambulatory Visit: Payer: Self-pay

## 2021-01-25 DIAGNOSIS — D6489 Other specified anemias: Secondary | ICD-10-CM

## 2021-01-25 LAB — POCT HEMOGLOBIN: Hemoglobin: 12.2 g/dL (ref 11–14.6)

## 2021-01-25 NOTE — Progress Notes (Signed)
   Subjective:    Patient ID: Carol Massey, female    DOB: Nov 16, 1925, 87 y.o.   MRN: [id number]  HPI  Hgb 12.2. Results discussed with Dr Nicki Reaper. Hgb good and recheck Hgb in 6 weeks. Son(DPR) notified and verbalized understanding and will call back to schedule office visit.  Review of Systems     Objective:   Physical Exam        Assessment & Plan:

## 2021-01-27 ENCOUNTER — Other Ambulatory Visit: Payer: Self-pay | Admitting: Family Medicine

## 2021-02-05 ENCOUNTER — Other Ambulatory Visit: Payer: Self-pay

## 2021-02-05 ENCOUNTER — Telehealth: Payer: Medicare Other | Admitting: Family Medicine

## 2021-02-05 DIAGNOSIS — E876 Hypokalemia: Secondary | ICD-10-CM

## 2021-02-05 DIAGNOSIS — D500 Iron deficiency anemia secondary to blood loss (chronic): Secondary | ICD-10-CM

## 2021-02-05 MED ORDER — POTASSIUM CHLORIDE CRYS ER 20 MEQ PO TBCR: 20.0000 meq | EXTENDED_RELEASE_TABLET | Freq: Every day | ORAL | 1 refills | Status: DC

## 2021-02-05 NOTE — Telephone Encounter (Signed)
Patient needs prescription for potassium sent to [facility]- patient will be having all scripts sent there over time- transitioning to them since it is cheaper

## 2021-02-05 NOTE — Telephone Encounter (Signed)
Patient's son [name]) is requesting that her prescription be sent to [facility]. He states [facility] will be sending over prescriptions today or tomorrow.

## 2021-02-05 NOTE — Telephone Encounter (Signed)
Telephone call- Voicemail not set up- Clarify medication

## 2021-02-05 NOTE — Telephone Encounter (Signed)
May send 90 days of requested medicine with 1 refill to [facility] thank you

## 2021-02-05 NOTE — Telephone Encounter (Signed)
Informed [name] potassium 20 meq has been sent to [facility].

## 2021-02-09 ENCOUNTER — Other Ambulatory Visit: Payer: Self-pay | Admitting: *Deleted

## 2021-02-09 MED ORDER — SPIRIVA HANDIHALER 18 MCG IN CAPS: 18.0000 ug | ORAL_CAPSULE | Freq: Every day | RESPIRATORY_TRACT | 0 refills | Status: DC

## 2021-02-09 MED ORDER — ALPRAZOLAM 0.25 MG PO TABS: ORAL_TABLET | ORAL | 2 refills | Status: DC

## 2021-02-09 MED ORDER — PANTOPRAZOLE SODIUM 40 MG PO TBEC: DELAYED_RELEASE_TABLET | ORAL | 1 refills | Status: DC

## 2021-02-09 MED ORDER — CITALOPRAM HYDROBROMIDE 10 MG PO TABS: 10.0000 mg | ORAL_TABLET | Freq: Every day | ORAL | 1 refills | Status: DC

## 2021-02-09 MED ORDER — BUDESONIDE-FORMOTEROL FUMARATE 80-4.5 MCG/ACT IN AERO: 2.0000 | INHALATION_SPRAY | Freq: Two times a day (BID) | RESPIRATORY_TRACT | 0 refills | Status: DC

## 2021-02-09 MED ORDER — AMLODIPINE BESYLATE 2.5 MG PO TABS: 2.5000 mg | ORAL_TABLET | Freq: Every day | ORAL | 0 refills | Status: DC

## 2021-02-09 MED ORDER — SUCRALFATE 1 G PO TABS: ORAL_TABLET | ORAL | 0 refills | Status: DC

## 2021-02-14 ENCOUNTER — Telehealth: Payer: Self-pay | Admitting: Family Medicine

## 2021-02-14 NOTE — Telephone Encounter (Signed)
Please go ahead with ordering 90-day supply on her medications with 1 refill for all her chronic meds Xanax as 30-day scripts These may be sent to [facility] as requested

## 2021-02-14 NOTE — Telephone Encounter (Signed)
Patient 's son is wanting all medications switched back to [place] because he them sent to [facility] but they havent delivered yet. Patient is also most out  of potassium  chloride 20 meq  90 day supply. [place]

## 2021-02-15 ENCOUNTER — Other Ambulatory Visit: Payer: Self-pay

## 2021-02-15 ENCOUNTER — Inpatient Hospital Stay (HOSPITAL_COMMUNITY): Payer: Medicare Other | Attending: Hematology

## 2021-02-15 DIAGNOSIS — Z79899 Other long term (current) drug therapy: Secondary | ICD-10-CM | POA: Insufficient documentation

## 2021-02-15 DIAGNOSIS — J449 Chronic obstructive pulmonary disease, unspecified: Secondary | ICD-10-CM | POA: Insufficient documentation

## 2021-02-15 DIAGNOSIS — I10 Essential (primary) hypertension: Secondary | ICD-10-CM

## 2021-02-15 DIAGNOSIS — D509 Iron deficiency anemia, unspecified: Secondary | ICD-10-CM | POA: Diagnosis present

## 2021-02-15 DIAGNOSIS — K5900 Constipation, unspecified: Secondary | ICD-10-CM | POA: Diagnosis not present

## 2021-02-15 DIAGNOSIS — D500 Iron deficiency anemia secondary to blood loss (chronic): Secondary | ICD-10-CM

## 2021-02-15 DIAGNOSIS — E876 Hypokalemia: Secondary | ICD-10-CM

## 2021-02-15 DIAGNOSIS — J431 Panlobular emphysema: Secondary | ICD-10-CM

## 2021-02-15 LAB — CBC WITH DIFFERENTIAL/PLATELET
Abs Immature Granulocytes: 0.02 10*3/uL (ref 0.00–0.07)
Basophils Absolute: 0.1 10*3/uL (ref 0.0–0.1)
Basophils Relative: 1 %
Eosinophils Absolute: 0.1 10*3/uL (ref 0.0–0.5)
Eosinophils Relative: 2 %
HCT: 38.8 % (ref 36.0–46.0)
Hemoglobin: 13 g/dL (ref 12.0–15.0)
Immature Granulocytes: 0 %
Lymphocytes Relative: 22 %
Lymphs Abs: 1.2 10*3/uL (ref 0.7–4.0)
MCH: 33.7 pg (ref 26.0–34.0)
MCHC: 33.5 g/dL (ref 30.0–36.0)
MCV: 100.5 fL — ABNORMAL HIGH (ref 80.0–100.0)
Monocytes Absolute: 0.5 10*3/uL (ref 0.1–1.0)
Monocytes Relative: 9 %
Neutro Abs: 3.6 10*3/uL (ref 1.7–7.7)
Neutrophils Relative %: 66 %
Platelets: 226 10*3/uL (ref 150–400)
RBC: 3.86 MIL/uL — ABNORMAL LOW (ref 3.87–5.11)
RDW: 12.8 % (ref 11.5–15.5)
WBC: 5.5 10*3/uL (ref 4.0–10.5)
nRBC: 0 % (ref 0.0–0.2)

## 2021-02-15 LAB — FERRITIN: Ferritin: 90 ng/mL (ref 11–307)

## 2021-02-15 LAB — FOLATE: Folate: 45.6 ng/mL (ref >5.9)

## 2021-02-15 LAB — IRON AND TIBC
Iron: 81 ug/dL (ref 28–170)
Saturation Ratios: 29 % (ref 10.4–31.8)
TIBC: 278 ug/dL (ref 250–450)
UIBC: 197 ug/dL

## 2021-02-15 LAB — VITAMIN B12: Vitamin B-12: 1361 pg/mL — ABNORMAL HIGH (ref 180–914)

## 2021-02-15 MED ORDER — AMLODIPINE BESYLATE 2.5 MG PO TABS: 2.5000 mg | ORAL_TABLET | Freq: Every day | ORAL | 0 refills | Status: DC

## 2021-02-15 MED ORDER — SPIRIVA HANDIHALER 18 MCG IN CAPS: 18.0000 ug | ORAL_CAPSULE | Freq: Every day | RESPIRATORY_TRACT | 0 refills | Status: DC

## 2021-02-15 MED ORDER — PANTOPRAZOLE SODIUM 40 MG PO TBEC: DELAYED_RELEASE_TABLET | ORAL | 1 refills | Status: DC

## 2021-02-15 MED ORDER — SUCRALFATE 1 G PO TABS: ORAL_TABLET | ORAL | 0 refills | Status: DC

## 2021-02-15 MED ORDER — POTASSIUM CHLORIDE CRYS ER 20 MEQ PO TBCR: 20.0000 meq | EXTENDED_RELEASE_TABLET | Freq: Every day | ORAL | 1 refills | Status: DC

## 2021-02-15 MED ORDER — CITALOPRAM HYDROBROMIDE 10 MG PO TABS: 10.0000 mg | ORAL_TABLET | Freq: Every day | ORAL | 1 refills | Status: DC

## 2021-02-15 MED ORDER — ALPRAZOLAM 0.25 MG PO TABS: ORAL_TABLET | ORAL | 2 refills | Status: DC

## 2021-02-15 MED ORDER — BUDESONIDE-FORMOTEROL FUMARATE 80-4.5 MCG/ACT IN AERO: 2.0000 | INHALATION_SPRAY | Freq: Two times a day (BID) | RESPIRATORY_TRACT | 0 refills | Status: DC

## 2021-02-15 NOTE — Telephone Encounter (Signed)
Spoke with patient's son to confirm request , he states has called and cancelled [facility] . Chronic care prescriptions have been sent to [facility].

## 2021-02-18 NOTE — Progress Notes (Signed)
Virtual Visit via Telephone Note [facility]  I connected with Carol Massey and her son Carol Massey) on 02/19/21  at 11:40 AM by telephone and verified that I am speaking with the correct person using two identifiers.  Location: Patient: Home Provider: [facility]   I discussed the limitations, risks, security and privacy concerns of performing an evaluation and management service by telephone and the availability of in person appointments. I also discussed with the patient that there may be a patient responsible charge related to this service. The patient expressed understanding and agreed to proceed.   History of Present Illness: Carol Massey is followed at our clinic for iron deficiency anemia, which was thought to be due to multiple gastrointestinal AVMs with chronic GI blood loss.  She was last evaluated via telephone visit with Dr. Delton Coombes on 11/17/2020.  Today's visit is conducted over the phone obtaining history from the patient's son Carol Massey, who is present with his mother over the phone.  The patient has not had any recent bleeding per rectum or melena.  She is not taking any iron tablets, as these have caused her severe constipation in the past.  Her energy levels are at baseline (75%).  She has chronic cough and shortness of breath with exertion due to her COPD.  No B symptoms.  Has intermittent constipation and pain from her hiatal hernia.  No chest pain, nausea, vomiting.   Observations/Objective: Review of Systems  Constitutional:  Positive for malaise/fatigue. Negative for chills, diaphoresis, fever and weight loss.  Respiratory:  Positive for cough and shortness of breath.   Cardiovascular:  Negative for chest pain and palpitations.  Gastrointestinal:  Positive for abdominal pain and constipation. Negative for blood in stool, diarrhea, nausea and vomiting.  Neurological:  Positive for dizziness (Vertigo). Negative for headaches.    PHYSICAL EXAM (per  limitations of virtual telephone visit): The patient is alert and oriented x 3, exhibiting adequate mentation, good mood, and ability to speak in full sentences and execute sound judgement.   ASSESSMENT & PLAN: 1.  Iron deficiency anemia: - Etiology is chronic GI blood loss from multiple gastrointestinal AVMs - Last Feraheme infusion on 04/04/2020 - Reviewed labs from 02/15/2021: Hgb 13.0 with MCV 100.5; ferritin 90, iron saturation 29%; no B12 or folate deficiencies apparent on lab work - No IV iron indicated at this time.  If ferritin continues to decrease and patient becomes symptomatic with fatigue, dyspnea on exertion, or chest pain, will consider Feraheme again. - PLAN: Repeat CBC, ferritin, iron panel in 3 months.   Follow Up Instructions: -Labs and phone visit in 3 months    I discussed the assessment and treatment plan with the patient. The patient was provided an opportunity to ask questions and all were answered. The patient agreed with the plan and demonstrated an understanding of the instructions.   The patient was advised to call back or seek an in-person evaluation if the symptoms worsen or if the condition fails to improve as anticipated.  I provided 9 minutes of non-face-to-face time during this encounter.   Harriett Rush, PA-C 02/19/21 11:53 AM

## 2021-02-19 ENCOUNTER — Other Ambulatory Visit: Payer: Self-pay

## 2021-02-19 ENCOUNTER — Inpatient Hospital Stay (HOSPITAL_BASED_OUTPATIENT_CLINIC_OR_DEPARTMENT_OTHER): Payer: Medicare Other | Admitting: Physician Assistant

## 2021-02-19 ENCOUNTER — Encounter (HOSPITAL_COMMUNITY): Payer: Self-pay | Admitting: Physician Assistant

## 2021-02-19 DIAGNOSIS — D500 Iron deficiency anemia secondary to blood loss (chronic): Secondary | ICD-10-CM | POA: Diagnosis not present

## 2021-02-28 ENCOUNTER — Telehealth: Payer: Self-pay | Admitting: *Deleted

## 2021-02-28 ENCOUNTER — Telehealth: Payer: Self-pay | Admitting: Family Medicine

## 2021-02-28 ENCOUNTER — Emergency Department (HOSPITAL_COMMUNITY)
Admission: EM | Admit: 2021-02-28 | Discharge: 2021-02-28 | Disposition: A | Discharge status: Home / Self Care | Payer: Medicare Other | Attending: Emergency Medicine | Admitting: Emergency Medicine

## 2021-02-28 ENCOUNTER — Emergency Department (HOSPITAL_COMMUNITY): Payer: Medicare Other

## 2021-02-28 ENCOUNTER — Encounter (HOSPITAL_COMMUNITY): Payer: Self-pay

## 2021-02-28 ENCOUNTER — Other Ambulatory Visit: Payer: Self-pay

## 2021-02-28 DIAGNOSIS — R079 Chest pain, unspecified: Secondary | ICD-10-CM | POA: Diagnosis present

## 2021-02-28 DIAGNOSIS — R1013 Epigastric pain: Secondary | ICD-10-CM | POA: Diagnosis not present

## 2021-02-28 DIAGNOSIS — I129 Hypertensive chronic kidney disease with stage 1 through stage 4 chronic kidney disease, or unspecified chronic kidney disease: Secondary | ICD-10-CM | POA: Diagnosis not present

## 2021-02-28 DIAGNOSIS — J45909 Unspecified asthma, uncomplicated: Secondary | ICD-10-CM | POA: Insufficient documentation

## 2021-02-28 DIAGNOSIS — Z20822 Contact with and (suspected) exposure to covid-19: Secondary | ICD-10-CM | POA: Insufficient documentation

## 2021-02-28 DIAGNOSIS — R110 Nausea: Secondary | ICD-10-CM | POA: Diagnosis not present

## 2021-02-28 DIAGNOSIS — Z85828 Personal history of other malignant neoplasm of skin: Secondary | ICD-10-CM | POA: Insufficient documentation

## 2021-02-28 DIAGNOSIS — R0789 Other chest pain: Secondary | ICD-10-CM

## 2021-02-28 DIAGNOSIS — Z7951 Long term (current) use of inhaled steroids: Secondary | ICD-10-CM | POA: Insufficient documentation

## 2021-02-28 DIAGNOSIS — J449 Chronic obstructive pulmonary disease, unspecified: Secondary | ICD-10-CM | POA: Diagnosis not present

## 2021-02-28 DIAGNOSIS — Z79899 Other long term (current) drug therapy: Secondary | ICD-10-CM | POA: Insufficient documentation

## 2021-02-28 DIAGNOSIS — N189 Chronic kidney disease, unspecified: Secondary | ICD-10-CM | POA: Insufficient documentation

## 2021-02-28 DIAGNOSIS — Z87891 Personal history of nicotine dependence: Secondary | ICD-10-CM | POA: Insufficient documentation

## 2021-02-28 LAB — CBC
HCT: 39.8 % (ref 36.0–46.0)
Hemoglobin: 13.2 g/dL (ref 12.0–15.0)
MCH: 33.2 pg (ref 26.0–34.0)
MCHC: 33.2 g/dL (ref 30.0–36.0)
MCV: 100.3 fL — ABNORMAL HIGH (ref 80.0–100.0)
Platelets: 247 10*3/uL (ref 150–400)
RBC: 3.97 MIL/uL (ref 3.87–5.11)
RDW: 12.9 % (ref 11.5–15.5)
WBC: 5.4 10*3/uL (ref 4.0–10.5)
nRBC: 0 % (ref 0.0–0.2)

## 2021-02-28 LAB — URINALYSIS, ROUTINE W REFLEX MICROSCOPIC
Bilirubin Urine: NEGATIVE
Glucose, UA: NEGATIVE mg/dL
Hgb urine dipstick: NEGATIVE
Ketones, ur: NEGATIVE mg/dL
Leukocytes,Ua: NEGATIVE
Nitrite: NEGATIVE
Protein, ur: NEGATIVE mg/dL
Specific Gravity, Urine: 1.009 (ref 1.005–1.030)
pH: 7 (ref 5.0–8.0)

## 2021-02-28 LAB — BASIC METABOLIC PANEL
Anion gap: 9 (ref 5–15)
BUN: 9 mg/dL (ref 8–23)
CO2: 26 mmol/L (ref 22–32)
Calcium: 10.1 mg/dL (ref 8.9–10.3)
Chloride: 101 mmol/L (ref 98–111)
Creatinine, Ser: 0.83 mg/dL (ref 0.44–1.00)
GFR, Estimated: >60 mL/min (ref >60)
Glucose, Bld: 89 mg/dL (ref 70–99)
Potassium: 4.4 mmol/L (ref 3.5–5.1)
Sodium: 136 mmol/L (ref 135–145)

## 2021-02-28 LAB — TROPONIN I (HIGH SENSITIVITY)
Troponin I (High Sensitivity): 5 ng/L (ref <18)
Troponin I (High Sensitivity): 5 ng/L (ref <18)

## 2021-02-28 MED ORDER — MORPHINE SULFATE (PF) 2 MG/ML IV SOLN
2.0000 mg | Freq: Once | INTRAVENOUS | Status: AC
Start: 2021-02-28 — End: 2021-02-28
  Administered 2021-02-28: 2 mg via INTRAVENOUS
  Filled 2021-02-28: qty 1

## 2021-02-28 NOTE — Telephone Encounter (Signed)
NP with pallative care called and spoke with dr scott today about home visit with pt.

## 2021-02-28 NOTE — ED Provider Notes (Signed)
[facility] EMERGENCY DEPARTMENT Provider Note   CSN: [id number] Arrival date & time: 02/28/21  1213     History Chief Complaint  Patient presents with   Chest Pain    Carol Massey is a 87 y.o. female.  The history is provided by the patient and medical records. No language interpreter was used.  Chest Pain  87 year old female significant history of hypertension, PUD, upper GI bleed, COPD,presenting for evaluation of chest pain.  Patient is hard of hearing, history obtained through son who is at bedside.  Per son, yesterday patient was her normal self.  This morning she appears to be fatigued, complaining of pain to her left side of her chest towards left shoulder towards neck and left arm as well as headache.  She was having some trouble breathing and also endorsed nausea.  Since then her pain did improve but not fully resolved.  No report of fever productive cough vomiting or significant shortness of breath.  Her son mention he contacted PCP and was recommended to come to ER for further evaluation.  Also report patient has hiatal hernia and she always has pain to her epigastric region, which is not new.  Patient is mostly wheelchair-bound due to her bad hip.  Past Medical History:  Diagnosis Date   Anemia, chronic disease 01/21/2018   Arthritis    Asthma    COPD (chronic obstructive pulmonary disease) ([place])    Dieulafoy lesion of colon 2006   Gastric AVM 2014   Generalized anxiety disorder 07/24/2015   Hard of hearing    Hypertension    Iron deficiency anemia due to chronic blood loss 05/27/2014   Kidney stone    Major depression in remission ([place]) 07/24/2015   Osteoporosis    PUD (peptic ulcer disease) 11/27/2015   Gastric and duodenal ulcer on EGD   Skin cancer    Status post dilation of esophageal narrowing    Stroke ([place])    Unsteady gait    Upper GI bleed 11/27/2015    Patient Active Problem List   Diagnosis Date Noted   LUQ abdominal pain 09/22/2019   Nausea without  vomiting 09/22/2019   Belching 09/22/2019   Constipation 05/18/2019   Abdominal pain 05/18/2019   Vomiting 05/18/2019   Intertrochanteric fracture of right hip ([place]) 12/28/2018   Closed right hip fracture, initial encounter ([place]) 12/27/2018   Anemia, chronic disease 01/21/2018   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   Essential hypertension 12/12/2016   History of peptic ulcer disease    Ataxia 12/01/2015   PUD (peptic ulcer disease)    Melena 11/27/2015   Upper GI bleed 11/27/2015   Tobacco abuse 11/27/2015   Azotemia 11/27/2015   Right-sided chest pain 11/27/2015   Dysphagia    Generalized anxiety disorder 07/24/2015   Major depression in remission ([place]) 07/24/2015   Anemia due to GI blood loss 05/27/2014   Hyperlipemia 03/18/2013   COPD (chronic obstructive pulmonary disease) ([place]) 01/12/2013   Osteoporosis 01/12/2013    Past Surgical History:  Procedure Laterality Date   ABDOMINAL HYSTERECTOMY     APPENDECTOMY     COLONOSCOPY  03/2005   Dr. Gala Romney: rectal and rectosigmoid polyps removed   COLONOSCOPY WITH ESOPHAGOGASTRODUODENOSCOPY (EGD) N/A 08/12/2013   Dr. Gala Romney: colonoscopy with tubular adenoma, colonic diverticulosis. EGD with non-critical Schatzki's ring, non-manipulated, and gastric AVM with evidence of recent bleeding, s/p thermal sealing and hemostasis clip   ESOPHAGEAL DILATION  11/27/2015   Procedure: ESOPHAGEAL DILATION;  Surgeon: Daneil Dolin, MD;  Location: [facility];  Service: Endoscopy;;   ESOPHAGOGASTRODUODENOSCOPY  03/2005   Dr. Gala Romney: tiny distal esophageal erosions   ESOPHAGOGASTRODUODENOSCOPY N/A 11/27/2015   RMR: Cricopharyngeus web, status post dilation. Nonbleeding duodenal and gastric ulcers without stigmata of bleeding. Gastric biopsies benign   ESOPHAGOGASTRODUODENOSCOPY N/A 02/14/2016   Procedure: ESOPHAGOGASTRODUODENOSCOPY (EGD);  Surgeon: Daneil Dolin, MD;  Location: [facility];  Service: Endoscopy;  Laterality: N/A;  300   GIVENS CAPSULE STUDY  N/A 11/27/2015   NOT DONE, WAS NOT INDICATED AT THE TIME   INTRAMEDULLARY (IM) NAIL INTERTROCHANTERIC Right 12/28/2018   Procedure: INTRAMEDULLARY (IM) NAIL INTERTROCHANTRIC;  Surgeon: Paralee Cancel, MD;  Location: [facility];  Service: Orthopedics;  Laterality: Right;   MALONEY DILATION N/A 02/14/2016   Procedure: Venia Minks DILATION;  Surgeon: Daneil Dolin, MD;  Location: [facility];  Service: Endoscopy;  Laterality: N/A;   small bowel capsule study  03/2005   Dr. Laural Golden: two small Dieulafoy lesions at ascending colon. tiney specks of blood at duodenal bulb but no identified lesions   TONSILLECTOMY       OB History   No obstetric history on file.     Family History  Problem Relation Age of Onset   Hypertension Mother    Stroke Mother    Leukemia Father 75   Lung cancer Father    Prostate cancer Son    Melanoma Son    Squamous cell carcinoma Son    Heart attack Daughter    Hypertension Daughter    Tremor Daughter    Colon cancer Neg Hx     Social History   Tobacco Use   Smoking status: Former    Packs/day: 0.50    Years: 70.00    Pack years: 35.00    Types: Cigarettes    Quit date: 08/26/2014    Years since quitting: 6.5   Smokeless tobacco: Never   Tobacco comments:    about 1/2 ppd  Vaping Use   Vaping Use: Never used  Substance Use Topics   Alcohol use: No    Alcohol/week: 0.0 standard drinks   Drug use: No    Home Medications Prior to Admission medications   Medication Sig Start Date End Date Taking? Authorizing Provider  Acetaminophen (TYLENOL EXTRA STRENGTH PO) Take by mouth. prn    [provider]  albuterol (PROVENTIL HFA;VENTOLIN HFA) 108 (90 Base) MCG/ACT inhaler INHALE 2 PUFFS INTO THE LUNGS EVERY 4 HOURS AS NEEDED FOR WHEEZING OR SHORTNESS OF BREATH. 02/09/18   [name], MD  ALPRAZolam (XANAX) 0.25 MG tablet TAKE 1/2 TABLET BY MOUTH IN THE MORNING AND 1 TO 2 TABLETS AT BEDTIME AS NEEDED FOR SLEEP. 02/15/21   [name], MD  amLODipine  (NORVASC) 2.5 MG tablet Take 1 tablet (2.5 mg total) by mouth daily. 02/15/21   [name], MD  Ascorbic Acid (VITAMIN C) 1000 MG tablet Take 1,000 mg by mouth daily.    [provider]  budesonide-formoterol (SYMBICORT) 80-4.5 MCG/ACT inhaler Inhale 2 puffs into the lungs 2 (two) times daily. 02/15/21   [name], MD  Calcium Carbonate Antacid (TUMS PO) Take by mouth. 3 -4 a day    [provider]  citalopram (CELEXA) 10 MG tablet Take 1 tablet (10 mg total) by mouth daily. 02/15/21   [name], MD  Cyanocobalamin (VITAMIN B 12 PO) Take 1,000 mcg by mouth every other day.     [provider]  dicyclomine (BENTYL) 10 MG  capsule Take one tid prn for abdominal cramps Patient not taking: Reported on 02/19/2021 08/04/19   [name], MD  meclizine (ANTIVERT) 25 MG tablet Take one every 8 hours prn vertigo. Caution drowiness Patient taking differently: 12.5 mg. Takes 1/2 tab qhs  prn vertigo. Causes drowiness 11/29/19   [name], MD  mupirocin ointment (BACTROBAN) 2 % Apply 1-2x per day to affected area 06/19/20   Luking, Elayne Snare, MD  ondansetron (ZOFRAN ODT) 4 MG disintegrating tablet Take 1 tablet (4 mg total) by mouth every 8 (eight) hours as needed for nausea or vomiting. 04/16/19   Mikey Kirschner, MD  OVER THE COUNTER MEDICATION Vit C one daily One a day multi vitamin Calcium 678m + D  Vit b12 1,000 mcg. One every other day    [provider]  OVER THE COUNTER MEDICATION Multivitamin qd    [provider]  OVER THE COUNTER MEDICATION Calcium 6077mplus Vit D3 2019mqd    [provider]  pantoprazole (PROTONIX) 40 MG tablet TAKE (1) TABLET BY MOUTH TWICE DAILY. Patient not taking: Reported on 02/19/2021 02/15/21   LukKathyrn DrownD  potassium chloride SA (KLOR-CON) 20 MEQ tablet Take 1 tablet (20 mEq total) by mouth daily. 02/15/21   LukKathyrn DrownD  Probiotic Product (PROBIOTIC PO) Take 1 capsule by mouth daily.  "10 Strands"    [provider]  sucralfate (CARAFATE) 1 g tablet TAKE (1) TABLET BY MOUTH THREE TIMES DAILY AS NEEDED. MAY BREAK UP AND MIX IN WATER. Patient not taking: Reported on 02/19/2021 02/15/21   LukKathyrn DrownD  tiotropium (SPIRIVA HANDIHALER) 18 MCG inhalation capsule Place 1 capsule (18 mcg total) into inhaler and inhale daily. 02/15/21   LukKathyrn DrownD  Zoster Vaccine Adjuvanted (SHAbilene Endoscopy Centernjection Shingrix (PF) 50 mcg/0.5 mL intramuscular suspension, kit    [provider]    Allergies    Patient has no known allergies.  Review of Systems   Review of Systems  Cardiovascular:  Positive for chest pain.  All other systems reviewed and are negative.  Physical Exam Updated Vital Signs BP 120/60 (BP Location: Right Arm)   Pulse 77   Temp 97.6 F (36.4 C) (Oral)   Resp 17   Ht 4' 10"  (1.473 m)   Wt 47.6 kg   LMP  (LMP Unknown)   SpO2 95%   BMI 21.93 kg/m   Physical Exam Vitals and nursing note reviewed.  Constitutional:      General: She is not in acute distress.    Appearance: She is well-developed.     Comments: Elderly female laying in bed appears to be in no acute discomfort.  HENT:     Head: Atraumatic.  Eyes:     Conjunctiva/sclera: Conjunctivae normal.  Cardiovascular:     Rate and Rhythm: Normal rate and regular rhythm.     Pulses: Normal pulses.     Heart sounds: Normal heart sounds.  Pulmonary:     Comments: Appears tachypneic Chest:     Chest wall: Tenderness (Tenderness along the left upper shoulder and trapezius region and left neck on palpation without any overlying skin changes.) present.  Abdominal:     Palpations: Abdomen is soft.     Tenderness: There is abdominal tenderness (Epigastric tenderness, chronic according to patient).  Musculoskeletal:     Cervical back: Neck supple.     Right lower leg: No edema.     Left lower leg: No  edema.  Skin:    Findings: No rash.  Neurological:     Mental Status: She is  alert.  Psychiatric:        Mood and Affect: Mood normal.    ED Results / Procedures / Treatments   Labs (all labs ordered are listed, but only abnormal results are displayed) Labs Reviewed  CBC - Abnormal; Notable for the following components:      Result Value   MCV 100.3 (*)    All other components within normal limits  SARS CORONAVIRUS 2 (TAT 6-24 HRS)  BASIC METABOLIC PANEL  URINALYSIS, ROUTINE W REFLEX MICROSCOPIC  TROPONIN I (HIGH SENSITIVITY)  TROPONIN I (HIGH SENSITIVITY)    EKG EKG Interpretation  Date/Time:  Wednesday February 28 2021 12:24:29 EDT Ventricular Rate:  85 PR Interval:  168 QRS Duration: 72 QT Interval:  348 QTC Calculation: 414 R Axis:   -28 Text Interpretation: Sinus rhythm with Premature supraventricular complexes Septal infarct , age undetermined Abnormal ECG No acute changes No significant change since last tracing Confirmed by Varney Biles [id number]) on 02/28/2021 2:03:55 PM ED ECG REPORT   Date: 02/28/2021  Rate: 85  Rhythm: normal sinus rhythm  QRS Axis: left  Intervals: normal  ST/T Wave abnormalities: nonspecific T wave changes  Conduction Disutrbances:none  Narrative Interpretation:   Old EKG Reviewed: unchanged  I have personally reviewed the EKG tracing and agree with the computerized printout as noted.   Radiology DG Chest 2 View  Result Date: 02/28/2021 CLINICAL DATA:  Left upper chest pain extending into the arm. No known injury. EXAM: CHEST - 2 VIEW COMPARISON:  Radiographs 12/27/2018.  CT 04/04/2005 and 08/23/2019. FINDINGS: The heart size and mediastinal contours are stable with a moderate to large hiatal hernia. There is mild aortic atherosclerosis. There is mild scarring or atelectasis at both lung bases. The lungs are otherwise clear. No pleural effusion or pneumothorax. Old rib fractures are noted on the right, and there is a thoracolumbar scoliosis. IMPRESSION: Stable chest. No active cardiopulmonary process. Moderate to large  hiatal hernia. Electronically Signed   By: Richardean Sale M.D.   On: 02/28/2021 13:10    Procedures Procedures   Medications Ordered in ED Medications  morphine 2 MG/ML injection 2 mg (2 mg Intravenous Given 02/28/21 1354)    ED Course  I have reviewed the triage vital signs and the nursing notes.  Pertinent labs & imaging results that were available during my care of the patient were reviewed by me and considered in my medical decision making (see chart for details).    MDM Rules/Calculators/A&P                          BP 139/69   Pulse 73   Temp 97.6 F (36.4 C) (Oral)   Resp (!) 34   Ht 4' 10"  (1.473 m)   Wt 47.6 kg   LMP  (LMP Unknown)   SpO2 95%   BMI 21.93 kg/m   Final Clinical Impression(s) / ED Diagnoses Final diagnoses:  Atypical chest pain    Rx / DC Orders ED Discharge Orders     None      1:16 PM Patient endorsed pain to her left side of her chest towards her left shoulder and left trapezius region this morning with some associated nausea.  When talking she is a bit tachypneic but she does have history of COPD.  She does have cardiac history and therefore work-up initiated.  She reported her symptom is improving.  She does have tenderness to palpation of her L upper chest, shoulder and L neck. No appreciable neck bruits.  Care discussed with Dr. Kathrynn Humble.   4:18 PM Chest x-ray without acute finding, labs are reassuring.  We did attempt to ambulate patient with nurse assisting however after walking a short distance, she began more tachypneic and O2 sats dropped to 92%.  Son reported patient is premature wheelchair-bound and barely able to ambulate.  I did discuss due to her age she could possibly have cardiac disease.  After discussion, patient is not a candidate for surgical invention such as cardiac stenting in the event that she does have ACS.  Her COVID test is currently pending.  Son prefers to take patient home.  Dr. Kathrynn Humble also agrees with plan.   Will discharge home with strict return precaution.   Domenic Moras, PA-C 02/28/21 [place], Ankit, MD 03/06/21 [phone]

## 2021-02-28 NOTE — ED Triage Notes (Signed)
Dr. Liborio Nixon sent pt. Pt. Is experiencing chest pain down the left side. Pt. Is also experiencing N/V. Pt. Is lethargic and has been experiencing a headache.

## 2021-02-28 NOTE — Discharge Instructions (Addendum)
You have been evaluated for chest pain.  Fortunately no acute concerning findings were noted on today's exam.  Since cardiac chest pain could be a possibility contributing to your discomfort, please follow-up closely with your primary care provider for further care.  Your urine did not show any signs of infection.  Chest x-ray did not shows any signs of pneumonia.  Return probably to the ER if your symptoms worsen

## 2021-02-28 NOTE — Telephone Encounter (Signed)
After discussion with palliative nurse practitioner they were going to transport patient to the ER for further evaluation.

## 2021-02-28 NOTE — ED Notes (Signed)
When attempting to ambulate patient, son assisted nurse to hold patient up to help with ambulating. Per son, patient normally walks no more than 40 steps with a gait belt and wlaker for assistance and she is "pretty much wheelchair bound." Patient shuffled left and right approximately 10 steps and appeared tachpneic with increased respiratory rate and effort and states weakness. Patient assisted back into bed and pulse ox dropped no less than 92%.

## 2021-02-28 NOTE — Telephone Encounter (Signed)
error 

## 2021-03-01 ENCOUNTER — Telehealth: Payer: Self-pay | Admitting: Family Medicine

## 2021-03-01 LAB — SARS CORONAVIRUS 2 (TAT 6-24 HRS): SARS Coronavirus 2: NEGATIVE

## 2021-03-01 NOTE — Telephone Encounter (Signed)
Son -Carol Massey state patient is too weak to come to office can you make home visit I schedule it on 7/14 at 4:10

## 2021-03-01 NOTE — Telephone Encounter (Signed)
Patient was seen in ER evaluated then sent home

## 2021-03-01 NOTE — Telephone Encounter (Signed)
It would be fine to put that as 4:10 PM In reality the office visit will be closer to 430-5 PM

## 2021-03-01 NOTE — Telephone Encounter (Signed)
Pt son took her to the emergency room last night and emergency room is wanting Carol Massey to follow up with Dr. Wolfgang Phoenix. Just needing to know where to put her on the schedule

## 2021-03-01 NOTE — Telephone Encounter (Signed)
The follow-up would have to be next week I have no available appointments for this week You will have to look at the schedule find out opening and place her thank you If no opening at the end of the morning or at the end of the afternoon

## 2021-03-08 ENCOUNTER — Ambulatory Visit: Payer: Medicare Other | Admitting: Family Medicine

## 2021-03-08 ENCOUNTER — Other Ambulatory Visit: Payer: Self-pay

## 2021-03-08 DIAGNOSIS — D500 Iron deficiency anemia secondary to blood loss (chronic): Secondary | ICD-10-CM

## 2021-03-08 DIAGNOSIS — Z515 Encounter for palliative care: Secondary | ICD-10-CM

## 2021-03-08 DIAGNOSIS — J431 Panlobular emphysema: Secondary | ICD-10-CM

## 2021-03-08 DIAGNOSIS — E876 Hypokalemia: Secondary | ICD-10-CM

## 2021-03-08 DIAGNOSIS — I10 Essential (primary) hypertension: Secondary | ICD-10-CM

## 2021-03-08 MED ORDER — AMLODIPINE BESYLATE 2.5 MG PO TABS: 2.5000 mg | ORAL_TABLET | Freq: Every day | ORAL | 1 refills | Status: DC

## 2021-03-08 MED ORDER — ALBUTEROL SULFATE HFA 108 (90 BASE) MCG/ACT IN AERS: INHALATION_SPRAY | RESPIRATORY_TRACT | 4 refills | Status: DC

## 2021-03-08 NOTE — Telephone Encounter (Signed)
error 

## 2021-03-08 NOTE — Progress Notes (Addendum)
   Subjective:    Patient ID: Carol Massey, female    DOB: 1925-11-09, 87 y.o.   MRN: [id number] Home visit patient is homebound HPIson Carol Massey is concerned about constipation. Last normal bm was 3 -4 days ago. Son asked if suppository could be given. Pallative care nurse will be out tomorrow.   Insurance nurse suggest going back on miralax.   Needs refill on rescue inhaler.  Patient intermittently has constipation wonders if they need to do anything for this we did discuss restarting MiraLAX a quarter capful daily if that does not do well enough to increase it to a half capful Maximum 1 capful the goal of soft bowel movements Patient without any abdominal pains or chest pains recently except for gas    Review of Systems     Objective:   Physical Exam Lungs are clear heart regular skin warm dry extremities no edema abdomen soft       Assessment & Plan:  Constipation overall stable continue current measures add MiraLAX back as discussed above  Palliative care no major setbacks are seen continue palliative measures Heel ulceration is much better and healed

## 2021-03-13 ENCOUNTER — Telehealth: Payer: Self-pay | Admitting: *Deleted

## 2021-03-13 NOTE — Telephone Encounter (Signed)
ProAir is also albuterol which is fine.  Please change to the covered prescription please notify son why this is happening thank you May have 6 refills

## 2021-03-13 NOTE — Telephone Encounter (Signed)
[facility] called and stated that ventolin is non preferred by insurance - her insurance prefers and and will cover proair  Please advise

## 2021-03-13 NOTE — Telephone Encounter (Signed)
Prescription called to [facility]. Winfred Burn (DPR) notified of change and verbalized understanding.

## 2021-03-24 ENCOUNTER — Other Ambulatory Visit: Payer: Self-pay | Admitting: Family Medicine

## 2021-03-26 NOTE — Telephone Encounter (Signed)
This was previously discontinued.  Please confirm with [name] that he is requesting this thank you

## 2021-03-27 NOTE — Telephone Encounter (Signed)
Pt son contacted and states that they were able to pick up both scripts yesterday. Pt son states patient is still using Sucralfate tablet TID for gas

## 2021-03-28 ENCOUNTER — Other Ambulatory Visit: Payer: Self-pay

## 2021-03-28 ENCOUNTER — Other Ambulatory Visit (INDEPENDENT_AMBULATORY_CARE_PROVIDER_SITE_OTHER): Payer: Medicare Other

## 2021-03-28 DIAGNOSIS — D500 Iron deficiency anemia secondary to blood loss (chronic): Secondary | ICD-10-CM | POA: Diagnosis not present

## 2021-03-28 LAB — POCT HEMOGLOBIN: Hemoglobin: 13.8 g/dL (ref 11–14.6)

## 2021-04-09 ENCOUNTER — Telehealth: Payer: Self-pay | Admitting: Family Medicine

## 2021-04-09 ENCOUNTER — Other Ambulatory Visit: Payer: Self-pay

## 2021-04-09 DIAGNOSIS — J431 Panlobular emphysema: Secondary | ICD-10-CM

## 2021-04-09 DIAGNOSIS — E876 Hypokalemia: Secondary | ICD-10-CM

## 2021-04-09 DIAGNOSIS — I10 Essential (primary) hypertension: Secondary | ICD-10-CM

## 2021-04-09 DIAGNOSIS — D500 Iron deficiency anemia secondary to blood loss (chronic): Secondary | ICD-10-CM

## 2021-04-09 NOTE — Telephone Encounter (Signed)
Son of patient called in. He states over the last 10-12 days his mother has been having issues with her bowel movements. He has to flush several times during the [facility] and he doesn't say it is diarrhea every time but she is going more frequently. He also states that she is having problems swallowing her  larger pills. He has gotten the powder for her Vitamin C and puts that in her prune juice every day. He would like to know if her potassium could be called in as liquid form since that is also a larger pill.   CB# [phone]

## 2021-04-09 NOTE — Telephone Encounter (Signed)
May change over the potassium 20 mEq daily New dosage will be in liquid please daily

## 2021-04-09 NOTE — Telephone Encounter (Signed)
Spoke with pharmacist at [facility]. She stated that liquid potassium is sometimes not covered by Medicare plans but she will run it and see and if not will talk to the son about subbing for the 10 meq tabs if he thinks she could swallow those since they are smaller

## 2021-04-10 NOTE — Telephone Encounter (Signed)
Sounds like a reasonable plan

## 2021-04-11 ENCOUNTER — Telehealth: Payer: Self-pay | Admitting: Family Medicine

## 2021-04-11 NOTE — Telephone Encounter (Signed)
Son(DPR)  stated today the patient is doing good and acting normal and was also ok yesterday- the nurses told him her hernia could make her vomit stool but son stated if she vomits stool again he will take her straight to the ER for evaluation and will just watch her as long as she is doing good like she is today

## 2021-04-11 NOTE — Telephone Encounter (Signed)
[name] son of patient called in. He has picked up the liquid potassium. She states that why he was picking that up the pharmacy also changed the Sucralfate to antiacid and gas relief regular strength still 3 times a day and Centrum Women 50+ to multi gummy's  and the vitamin C to powder.  On 04/09/2021 Phone note it does talk about bowel movements but today he added that she was throwing up bowel over the weekend.   His concern is still that his mom has had explosive BM over the pat 10-12 days and that over the weekend he added that she was also throwing up bowel when she was having the bowel movement.  He did say that over the past two days she still has a good BM but not as bad as it was over the weekend.   CB# [phone]

## 2021-04-11 NOTE — Telephone Encounter (Signed)
Please talk with the son Please try to get a handle on what is going on currently? There are no easy answers If she is actually throwing up Bowel movement then evaluation often in the ER is the next step  Also is it possible for palliative nurse to come to house to asses what is going on (has he called them ) If emergent situation is going on this afternoon I will not be able to address it other than to recommend ER  Otherwise gathering more information and I will be able to respond tomorrow

## 2021-04-12 ENCOUNTER — Other Ambulatory Visit: Payer: Self-pay | Admitting: Family Medicine

## 2021-04-12 DIAGNOSIS — E876 Hypokalemia: Secondary | ICD-10-CM

## 2021-04-12 DIAGNOSIS — I10 Essential (primary) hypertension: Secondary | ICD-10-CM

## 2021-04-12 DIAGNOSIS — J431 Panlobular emphysema: Secondary | ICD-10-CM

## 2021-04-12 DIAGNOSIS — D500 Iron deficiency anemia secondary to blood loss (chronic): Secondary | ICD-10-CM

## 2021-04-18 ENCOUNTER — Telehealth: Payer: Self-pay | Admitting: Family Medicine

## 2021-04-18 DIAGNOSIS — R1310 Dysphagia, unspecified: Secondary | ICD-10-CM

## 2021-04-18 NOTE — Telephone Encounter (Signed)
Please go ahead with order for speech therapy for home-communicate such to the hospice nurse as well

## 2021-04-18 NOTE — Telephone Encounter (Signed)
Hospice nurse calling to see if Dr.Scott would place an order for speech therapy. Son had mentioned pt had began to have problems swallowing. Soon would like [place]. Please advise. Thank you (Call son [name] back if approved)

## 2021-04-19 NOTE — Telephone Encounter (Signed)
Referral ordered in Epic. Son(DPR) notified.

## 2021-04-19 NOTE — Addendum Note (Signed)
Addended by: Dairl Ponder on: 04/19/2021 10:45 AM   Modules accepted: Orders

## 2021-04-24 ENCOUNTER — Telehealth: Payer: Self-pay | Admitting: *Deleted

## 2021-04-24 NOTE — Chronic Care Management (AMB) (Signed)
  Chronic Care Management   Note  04/24/2021 Name: Carol Massey MRN: [id number] DOB: 1925/09/25  Carol Massey is a 87 y.o. year old female who is a primary care patient of Luking, Elayne Snare, MD. I reached out to Abran Duke by phone today in response to a referral sent by Carol Massey's PCP, Dr. Wolfgang Phoenix.      Carol Massey was given information about Chronic Care Management services today including:  CCM service includes personalized support from designated clinical staff supervised by her physician, including individualized plan of care and coordination with other care providers 24/7 contact phone numbers for assistance for urgent and routine care needs. Service will only be billed when office clinical staff spend 20 minutes or more in a month to coordinate care. Only one practitioner may furnish and bill the service in a calendar month. The patient may stop CCM services at any time (effective at the end of the month) by phone call to the office staff. The patient will be responsible for cost sharing (co-pay) of up to 20% of the service fee (after annual deductible is met).  Son Carol Massey  verbally agreed to assistance and services provided by embedded care coordination/care management team today.  Follow up plan: Telephone appointment with care management team member scheduled for:05/30/21  [facility]  Direct Dial: [phone]

## 2021-04-27 ENCOUNTER — Other Ambulatory Visit: Payer: Self-pay | Admitting: *Deleted

## 2021-04-27 DIAGNOSIS — R1310 Dysphagia, unspecified: Secondary | ICD-10-CM

## 2021-05-04 ENCOUNTER — Telehealth: Payer: Self-pay | Admitting: Family Medicine

## 2021-05-04 NOTE — Telephone Encounter (Signed)
Patients son DECLINED AWV.

## 2021-05-05 ENCOUNTER — Other Ambulatory Visit: Payer: Self-pay | Admitting: Family Medicine

## 2021-05-06 NOTE — Telephone Encounter (Signed)
Thank you for that noticed It is understandable for him to decline, his mom is within the thin stage of her COPD/hospice care thank you

## 2021-05-16 ENCOUNTER — Inpatient Hospital Stay (HOSPITAL_COMMUNITY): Payer: Medicare Other | Attending: Hematology

## 2021-05-16 ENCOUNTER — Other Ambulatory Visit: Payer: Self-pay

## 2021-05-16 DIAGNOSIS — K5900 Constipation, unspecified: Secondary | ICD-10-CM | POA: Insufficient documentation

## 2021-05-16 DIAGNOSIS — D509 Iron deficiency anemia, unspecified: Secondary | ICD-10-CM | POA: Insufficient documentation

## 2021-05-16 DIAGNOSIS — D500 Iron deficiency anemia secondary to blood loss (chronic): Secondary | ICD-10-CM

## 2021-05-16 LAB — CBC WITH DIFFERENTIAL/PLATELET
Abs Immature Granulocytes: 0.01 10*3/uL (ref 0.00–0.07)
Basophils Absolute: 0.1 10*3/uL (ref 0.0–0.1)
Basophils Relative: 2 %
Eosinophils Absolute: 0.1 10*3/uL (ref 0.0–0.5)
Eosinophils Relative: 2 %
HCT: 38.8 % (ref 36.0–46.0)
Hemoglobin: 12.5 g/dL (ref 12.0–15.0)
Immature Granulocytes: 0 %
Lymphocytes Relative: 24 %
Lymphs Abs: 1.1 10*3/uL (ref 0.7–4.0)
MCH: 32.6 pg (ref 26.0–34.0)
MCHC: 32.2 g/dL (ref 30.0–36.0)
MCV: 101 fL — ABNORMAL HIGH (ref 80.0–100.0)
Monocytes Absolute: 0.5 10*3/uL (ref 0.1–1.0)
Monocytes Relative: 10 %
Neutro Abs: 2.9 10*3/uL (ref 1.7–7.7)
Neutrophils Relative %: 62 %
Platelets: 229 10*3/uL (ref 150–400)
RBC: 3.84 MIL/uL — ABNORMAL LOW (ref 3.87–5.11)
RDW: 12.9 % (ref 11.5–15.5)
WBC: 4.7 10*3/uL (ref 4.0–10.5)
nRBC: 0 % (ref 0.0–0.2)

## 2021-05-16 LAB — FERRITIN: Ferritin: 70 ng/mL (ref 11–307)

## 2021-05-16 LAB — IRON AND TIBC
Iron: 69 ug/dL (ref 28–170)
Saturation Ratios: 26 % (ref 10.4–31.8)
TIBC: 270 ug/dL (ref 250–450)
UIBC: 201 ug/dL

## 2021-05-21 NOTE — Progress Notes (Signed)
Virtual Visit via Telephone Note [facility]  I connected with Carol Massey  on 05/22/21  at   by telephone and verified that I am speaking with the correct person using two identifiers.  Location: Patient: Home Provider: [facility]   I discussed the limitations, risks, security and privacy concerns of performing an evaluation and management service by telephone and the availability of in person appointments. I also discussed with the patient that there may be a patient responsible charge related to this service. The patient expressed understanding and agreed to proceed.   HISTORY OF PRESENT ILLNESS: Carol Massey is followed in our clinic for iron deficiency anemia, which is thought to be due to multiple gastrointestinal AVMs and associated chronic GI blood loss.  She was last evaluated via telephone visit with Tarri Abernethy PA-C on 02/19/2021.  Today's visit is conducted over the phone obtaining history from the patient's son Carol Massey, who is present with his mother over the phone.  Overall, the patient is doing fairly well despite her age and multiple comorbidities.  No recent episodes of bleeding per rectum, melena, hematochezia, hematemesis, or epistaxis.  She no longer takes iron tablets, as they cause severe constipation.  Energy levels are fair.  She has chronic cough and dyspnea on exertion due to advanced COPD, but no acute changes in her breathing status.  No chest pain, headaches, or syncope.  No B symptoms such as fever, chills, night sweats, unintentional weight loss.  Energy is reported at 60%, with 100% appetite.  Patient is reported to be maintaining a stable weight at this time.    OBSERVATIONS/OBJECTIVE: Review of Systems  Constitutional:  Positive for malaise/fatigue (mild fatigue, at baseline energy for age and comorbidities). Negative for chills, diaphoresis, fever and weight loss.  HENT:         Difficulty swallowing pills  Respiratory:   Positive for cough (at baseline for COPD) and shortness of breath (with exertion, due to COPD).   Cardiovascular:  Negative for chest pain and palpitations.  Gastrointestinal:  Positive for constipation, nausea and vomiting. Negative for abdominal pain, blood in stool and melena.  Neurological:  Positive for dizziness. Negative for headaches.    PHYSICAL EXAM (per limitations of virtual telephone visit): The patient is alert and oriented x 3, exhibiting adequate mentation, good mood, and ability to speak in full sentences and execute sound judgement.   ASSESSMENT & PLAN: 1.  Iron deficiency anemia: - Etiology is chronic GI blood loss from multiple gastrointestinal AVMs - She is unable to tolerate oral iron tablets due to severe constipation. - Last Feraheme infusion on 04/04/2020 - Reviewed labs from (05/16/2021), normal Hgb 12.5, ferritin 70, and iron saturation 26% - No IV iron indicated at this time.  If ferritin continues to decrease and patient becomes symptomatic with fatigue, dyspnea on exertion, or chest pain, will consider Feraheme again. - PLAN: Repeat labs and phone visit in 3 months.   FOLLOW UP INSTRUCTIONS: -Labs and phone visit in 3 months    I discussed the assessment and treatment plan with the patient. The patient was provided an opportunity to ask questions and all were answered. The patient agreed with the plan and demonstrated an understanding of the instructions.   The patient was advised to call back or seek an in-person evaluation if the symptoms worsen or if the condition fails to improve as anticipated.  I provided 11 minutes of non-face-to-face time during this encounter.   Eugene Garnet  Hervey Ard, PA-C 05/22/2021 11:02 AM

## 2021-05-22 ENCOUNTER — Inpatient Hospital Stay (HOSPITAL_BASED_OUTPATIENT_CLINIC_OR_DEPARTMENT_OTHER): Payer: Medicare Other | Admitting: Physician Assistant

## 2021-05-22 ENCOUNTER — Other Ambulatory Visit: Payer: Self-pay

## 2021-05-22 DIAGNOSIS — E538 Deficiency of other specified B group vitamins: Secondary | ICD-10-CM | POA: Diagnosis not present

## 2021-05-22 DIAGNOSIS — D500 Iron deficiency anemia secondary to blood loss (chronic): Secondary | ICD-10-CM | POA: Diagnosis not present

## 2021-05-24 ENCOUNTER — Ambulatory Visit: Payer: Medicare Other | Admitting: Family Medicine

## 2021-05-24 ENCOUNTER — Other Ambulatory Visit: Payer: Self-pay

## 2021-05-24 ENCOUNTER — Other Ambulatory Visit: Payer: Self-pay | Admitting: Family Medicine

## 2021-05-24 DIAGNOSIS — J431 Panlobular emphysema: Secondary | ICD-10-CM | POA: Diagnosis not present

## 2021-05-24 DIAGNOSIS — R270 Ataxia, unspecified: Secondary | ICD-10-CM

## 2021-05-24 DIAGNOSIS — R1310 Dysphagia, unspecified: Secondary | ICD-10-CM

## 2021-05-24 DIAGNOSIS — R531 Weakness: Secondary | ICD-10-CM

## 2021-05-24 NOTE — Progress Notes (Addendum)
   Subjective:    Patient ID: Carol Massey, female    DOB: 02/02/1926, 87 y.o.   MRN: [id number] Home visit Patient is homebound  HPI 87 year old woman difficult time with breathing eating losing some weight son doing a good job with her Home visit Bowel movements are moving but have to use stool softener on a regular basis Denies any rectal bleeding  Review of Systems     Objective:   Physical Exam  Heart regular abdomen soft subjective tenderness in the upper abdomen severe COPD Is bedbound and chair bound Walks very little Would benefit from ongoing home health      Assessment & Plan:  Weakness Palliative care May have home health referral already receiving some home health but apparently needs a new referral Please maintain the same group that is helping her currently  Intermittent abdominal pain constipation occasionally has difficult time passing bowel movements.  Severe COPD with weight loss.  Son doing a good job with nutrition  Recheck again in 4 months

## 2021-05-25 ENCOUNTER — Telehealth: Payer: Self-pay | Admitting: Family Medicine

## 2021-05-25 NOTE — Progress Notes (Signed)
Referral ordered in EPIC. 

## 2021-05-25 NOTE — Telephone Encounter (Signed)
Please do so

## 2021-05-25 NOTE — Telephone Encounter (Signed)
Left message to return call with speech therapist

## 2021-05-25 NOTE — Addendum Note (Signed)
Addended by: Dairl Ponder on: 05/25/2021 08:55 AM   Modules accepted: Orders

## 2021-05-25 NOTE — Telephone Encounter (Signed)
Please advise. Thank you

## 2021-05-25 NOTE — Telephone Encounter (Signed)
Barnett Applebaum Botzis, speech therapist, requesting verbal orders for speech therapy, 1 week 3, for dysphagia.  CB#  [phone]

## 2021-05-30 ENCOUNTER — Ambulatory Visit (INDEPENDENT_AMBULATORY_CARE_PROVIDER_SITE_OTHER): Payer: Medicare Other | Admitting: *Deleted

## 2021-05-30 DIAGNOSIS — J431 Panlobular emphysema: Secondary | ICD-10-CM

## 2021-05-30 DIAGNOSIS — I10 Essential (primary) hypertension: Secondary | ICD-10-CM

## 2021-05-30 NOTE — Chronic Care Management (AMB) (Addendum)
Chronic Care Management   CCM RN Visit Note  05/30/2021 Name: Carol Massey MRN: [id number] DOB: [birth year]  Subjective: Carol Massey is a 87 y.o. year old female who is a primary care patient of Luking, Elayne Snare, MD. The care management team was consulted for assistance with disease management and care coordination needs.    Engaged with patient by telephone for Initial telephone outreach  in response to provider referral for case management and/or care coordination services.   Consent to Services:  The patient was given the following information about Chronic Care Management services today, agreed to services, and gave verbal consent: 1. CCM service includes personalized support from designated clinical staff supervised by the primary care provider, including individualized plan of care and coordination with other care providers 2. 24/7 contact phone numbers for assistance for urgent and routine care needs. 3. Service will only be billed when office clinical staff spend 20 minutes or more in a month to coordinate care. 4. Only one practitioner may furnish and bill the service in a calendar month. 5.The patient may stop CCM services at any time (effective at the end of the month) by phone call to the office staff. 6. The patient will be responsible for cost sharing (co-pay) of up to 20% of the service fee (after annual deductible is met). Patient agreed to services and consent obtained.  Patient agreed to services and verbal consent obtained.   Assessment: Review of patient past medical history, allergies, medications, health status, including review of consultants reports, laboratory and other test data, was performed as part of comprehensive evaluation and provision of chronic care management services.   SDOH (Social Determinants of Health) assessments and interventions performed:  SDOH Interventions    Flowsheet Row Most Recent Value  SDOH Interventions   Food Insecurity Interventions  Intervention Not Indicated  Transportation Interventions Intervention Not Indicated        CCM Care Plan  No Known Allergies  Outpatient Encounter Medications as of 05/30/2021  Medication Sig Note   Acetaminophen (TYLENOL EXTRA STRENGTH PO) Take by mouth. prn 03/08/2021: One tid   albuterol (VENTOLIN HFA) 108 (90 Base) MCG/ACT inhaler INHALE 2 PUFFS INTO THE LUNGS EVERY 4 HOURS AS NEEDED FOR WHEEZING OR SHORTNESS OF BREATH.    ALPRAZolam (XANAX) 0.25 MG tablet TAKE 1/2 TABLET BY MOUTH IN THE MORNING AND 1 TO 2 TABLETS AT BEDTIME AS NEEDED FOR SLEEP.    amLODipine (NORVASC) 2.5 MG tablet Take 1 tablet (2.5 mg total) by mouth daily.    Ascorbic Acid (VITAMIN C) 1000 MG tablet Take 1,000 mg by mouth daily.    budesonide-formoterol (SYMBICORT) 80-4.5 MCG/ACT inhaler Inhale 2 puffs into the lungs 2 (two) times daily.    Calcium Carbonate Antacid (TUMS PO) Take by mouth. 3 -4 a day    citalopram (CELEXA) 10 MG tablet Take 1 tablet (10 mg total) by mouth daily.    Cyanocobalamin (VITAMIN B 12 PO) Take 1,000 mcg by mouth every other day.     OVER THE COUNTER MEDICATION Vit C one daily One a day multi vitamin Calcium 655m + D  Vit b12 1,000 mcg. One every other day    OVER THE COUNTER MEDICATION Multivitamin qd    OVER THE COUNTER MEDICATION Calcium 6031mplus Vit D3 2054mqd    pantoprazole (PROTONIX) 40 MG tablet TAKE (1) TABLET BY MOUTH TWICE DAILY.    potassium chloride 20 MEQ/15ML (10%) SOLN DILUTE 15 ML IN 8 OUNCES OF WATER OR  JUICE AND TAKE ONCE DAILY.    sucralfate (CARAFATE) 1 g tablet TAKE 1 TABLET BY MOUTH 3 TIMES DAILY AS NEEDED. MAY BREAK UP AND MIX IN WATER.    tiotropium (SPIRIVA HANDIHALER) 18 MCG inhalation capsule Place 1 capsule (18 mcg total) into inhaler and inhale daily.    meclizine (ANTIVERT) 25 MG tablet Take one every 8 hours prn vertigo. Caution drowiness (Patient not taking: Reported on 05/30/2021)    potassium chloride SA (KLOR-CON) 20 MEQ tablet Take 1 tablet (20  mEq total) by mouth daily. (Patient not taking: Reported on 05/30/2021)    Zoster Vaccine Adjuvanted [facility]) injection Shingrix (PF) 50 mcg/0.5 mL intramuscular suspension, kit (Patient not taking: Reported on 05/30/2021)    Facility-Administered Encounter Medications as of 05/30/2021  Medication   sodium chloride flush (NS) 0.9 % injection 10 mL    Patient Active Problem List   Diagnosis Date Noted   LUQ abdominal pain 09/22/2019   Nausea without vomiting 09/22/2019   Belching 09/22/2019   Constipation 05/18/2019   Abdominal pain 05/18/2019   Vomiting 05/18/2019   Intertrochanteric fracture of right hip ([place]) 12/28/2018   Closed right hip fracture, initial encounter ([place]) 12/27/2018   Anemia, chronic disease 01/21/2018   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   Essential hypertension 12/12/2016   History of peptic ulcer disease    Ataxia 12/01/2015   PUD (peptic ulcer disease)    Melena 11/27/2015   Upper GI bleed 11/27/2015   Tobacco abuse 11/27/2015   Azotemia 11/27/2015   Right-sided chest pain 11/27/2015   Dysphagia    Generalized anxiety disorder 07/24/2015   Major depression in remission ([place]) 07/24/2015   Anemia due to GI blood loss 05/27/2014   Hyperlipemia 03/18/2013   COPD (chronic obstructive pulmonary disease) ([place]) 01/12/2013   Osteoporosis 01/12/2013    Conditions to be addressed/monitored:HTN and COPD  Care Plan : Hypertension (Adult)  Updates made by Kassie Mends, RN since 05/30/2021 12:00 AM     Problem: Hypertension (Hypertension)      Long-Range Goal: Hypertension Monitored   Start Date: 05/30/2021  Expected End Date: 11/28/2021  This Visit's Progress: On track  Priority: Low  Note:   Objective:  Last practice recorded BP readings:  BP Readings from Last 3 Encounters:  02/28/21 134/70  11/10/20 120/64  08/30/20 110/70   Most recent eGFR/CrCl: No results found for: EGFR  No components found for: CRCL Current Barriers:  Knowledge Deficits related  to basic understanding of hypertension pathophysiology and self care management- patient lives with adult son who is primary caregiver, pt has CNA from [facility] that sees her M-F 11am-3pm and son pays out of pocket for one hour on Saturday to assist with bathing patient.  Pt is able to transfer from wheelchair to bed, to commode etc and can walk short distances with assistance of another person, wheelchair and gait belt.  Pt had difficulty swallowing several weeks back which has now improved and speech therapy (home health) has been working with patient and to see her again on 06/04/21.  Son reports CNA monitors patient's blood pressure as well as he does (but this is not daily), visiting RN from palliative care (once month) completes assessment and [employer] RN visits one monthly also.  Son reports they have adequate resources at present. Son requests advanced directives packet be mailed. Unable to perform ADLs independently- son provides oversight with all aspects of patient's care including medications Unable to perform IADLs independently- son assists Case Manager Clinical Goal(s):  patient will verbalize understanding of plan for hypertension management patient will attend all scheduled medical appointments: Tarri Abernethy  09/03/21 patient will demonstrate improved adherence to prescribed treatment plan for hypertension as evidenced by taking all medications as prescribed, monitoring and recording blood pressure as directed, adhering to low sodium/DASH diet Interventions:  Collaboration with [name], MD regarding development and update of comprehensive plan of care as evidenced by provider attestation and co-signature Inter-disciplinary care team collaboration (see longitudinal plan of care) Evaluation of current treatment plan related to hypertension self management and patient's adherence to plan as established by provider. Provided education to patient re: stroke prevention,  s/s of heart attack and stroke, DASH diet, complications of uncontrolled blood pressure Reviewed medications with patient and discussed importance of compliance Discussed plans with patient for ongoing care management follow up and provided patient with direct contact information for care management team Advised patient, providing education and rationale, to monitor blood pressure 3 x week and record, calling PCP for findings outside established parameters.  Reviewed scheduled/upcoming provider appointments including: Tarri Abernethy  09/03/21 Mailed advanced directives packet and education - low sodium diet Self-Care Activities: Attends all scheduled provider appointments Calls provider office for new concerns, questions, or BP outside discussed parameters Checks BP and records as discussed Follows a low sodium diet/DASH diet Patient Goals: - check blood pressure 3 times per week - write blood pressure results in a log or diary  - follow low sodium diet- check labels, use fresh/ frozen foods whenever possible - look over information mailed- low sodium diet  - advanced directives packet mailed - call RN care manager if any questions at [phone] Follow Up Plan: Telephone follow up appointment with care management team member scheduled for:   07/18/2021    Care Plan : COPD (Adult)  Updates made by Kassie Mends, RN since 05/30/2021 12:00 AM     Problem: Symptom Exacerbation (COPD)      Long-Range Goal: Symptom Exacerbation Prevented or Minimized   Start Date: 05/30/2021  Expected End Date: 11/28/2021  This Visit's Progress: On track  Priority: Medium  Note:   Current Barriers:  Knowledge deficits related to basic understanding of COPD disease process- patient/ caregiver needs teaching/ reinforcement COPD action plan Unable to perform ADLs independently- son assists Unable to perform IADLs independently- son assists Case Manager Clinical Goal(s): patient will report using  inhalers as prescribed including rinsing mouth after use patient will verbalize understanding of COPD action plan and when to seek appropriate levels of medical care  Interventions:  Collaboration with [name], MD regarding development and update of comprehensive plan of care as evidenced by provider attestation and co-signature Inter-disciplinary care team collaboration (see longitudinal plan of care) Provided patient with basic written and verbal COPD education on self care/management/and exacerbation prevention  Provided patient with COPD action plan and reinforced importance of daily self assessment Provided instruction about proper use of medications used for management of COPD including inhalers Advised patient to self assesses COPD action plan zone and make appointment with provider if in the yellow zone for 48 hours without improvement. Provided education about and advised patient to utilize infection prevention strategies to reduce risk of respiratory infection  Mailed copy of COPD action plan Self-Care Activities:  Attends all scheduled provider appointments Patient Goals: - develop a rescue plan - eliminate symptom triggers at home - follow rescue plan if symptoms flare-up- call your doctor early on for change in health status/ symptoms - keep follow-up  appointments  - use inhalers as presribed - please see education mailed- COPD action plan - do breathing exercises every day - eat healthy - get at least 7 to 8 hours of sleep at night - get outdoors every day (weather permitting) Follow Up Plan: Telephone follow up appointment with care management team member scheduled for:   07/18/2021     Plan:Telephone follow up appointment with care management team member scheduled for: 07/18/2021  Jacqlyn Larsen [facility], BSN RN Case Manager [place] [phone]

## 2021-05-30 NOTE — Patient Instructions (Signed)
Visit Information   PATIENT GOALS:   Goals Addressed             This Visit's Progress    Track and Manage My Blood Pressure-Hypertension       Timeframe:  Long-Range Goal Priority:  Low Start Date:           05/30/2021                  Expected End Date:      11/28/2021                 Follow Up Date 07/18/2021   - check blood pressure 3 times per week - write blood pressure results in a log or diary  - follow low sodium diet- check labels, use fresh/ frozen foods whenever possible - look over information mailed- low sodium diet  - advanced directives packet mailed - call RN care manager if any questions at [phone]   Why is this important?   You won't feel high blood pressure, but it can still hurt your blood vessels.  High blood pressure can cause heart or kidney problems. It can also cause a stroke.  Making lifestyle changes like losing a little weight or eating less salt will help.  Checking your blood pressure at home and at different times of the day can help to control blood pressure.  If the doctor prescribes medicine remember to take it the way the doctor ordered.  Call the office if you cannot afford the medicine or if there are questions about it.     Notes:      Track and Manage My Symptoms-COPD       Timeframe:  Long-Range Goal Priority:  Medium Start Date:             05/30/2021                Expected End Date:      11/28/2021                 Follow Up Date 07/18/2021   - develop a rescue plan - eliminate symptom triggers at home - follow rescue plan if symptoms flare-up- call your doctor early on for change in health status/ symptoms - keep follow-up appointments  - use inhalers as presribed - please see education mailed- COPD action plan   Why is this important?   Tracking your symptoms and other information about your health helps your doctor plan your care.  Write down the symptoms, the time of day, what you were doing and what medicine you are  taking.  You will soon learn how to manage your symptoms.     Notes:         Consent to CCM Services: Carol Massey was given information about Chronic Care Management services including:  CCM service includes personalized support from designated clinical staff supervised by her physician, including individualized plan of care and coordination with other care providers 24/7 contact phone numbers for assistance for urgent and routine care needs. Service will only be billed when office clinical staff spend 20 minutes or more in a month to coordinate care. Only one practitioner may furnish and bill the service in a calendar month. The patient may stop CCM services at any time (effective at the end of the month) by phone call to the office staff. The patient will be responsible for cost sharing (co-pay) of up to 20% of the service fee (after annual deductible is  met).  Patient agreed to services and verbal consent obtained.   The patient verbalized understanding of instructions, educational materials, and care plan provided today and agreed to receive a mailed copy of patient instructions, educational materials, and care plan.   Telephone follow up appointment with care management team member scheduled for:  07/18/2021  COPD Action Plan A COPD action plan is a description of what to do when you have a flare (exacerbation) of chronic obstructive pulmonary disease (COPD). Your action plan is a color-coded plan that lists the symptoms that indicate whether your condition is under control and what actions to take. If you have symptoms in the green zone, it means you are doing well that day. If you have symptoms in the yellow zone, it means you are having a bad day or an exacerbation. If you have symptoms in the red zone, you need urgent medical care. Follow the plan that you and your health care provider developed. Review your plan with your health care provider at each visit. Red zone Symptoms in  this zone mean that you should get medical help right away. They include: Feeling very short of breath, even when you are resting. Not being able to do any activities because of poor breathing. Not being able to sleep because of poor breathing. Fever or shaking chills. Feeling confused or very sleepy. Chest pain. Coughing up blood. If you have any of these symptoms, call emergency services (911 in the [place] or go to the nearest emergency room. Yellow zone Symptoms in this zone mean that your condition may be getting worse. They include: Feeling more short of breath than usual. Having less energy for daily activities than usual. Phlegm or mucus that is thicker than usual. Needing to use your rescue inhaler or nebulizer more often than usual. More ankle swelling than usual. Coughing more than usual. Feeling like you have a chest cold. Trouble sleeping due to COPD symptoms. Decreased appetite. COPD medicines not helping as much as usual. If you experience any "yellow" symptoms: Keep taking your daily medicines as directed. Use your quick-relief inhaler as told by your health care provider. If you were prescribed steroid medicine to take by mouth (oral medicine), start taking it as told by your health care provider. If you were prescribed an antibiotic medicine, start taking it as told by your health care provider. Do not stop taking the antibiotic even if you start to feel better. Use oxygen as told by your health care provider. Get more rest. Do your pursed-lip breathing exercises. Do not smoke. Avoid any irritants in the air. If your signs and symptoms do not improve after taking these steps, call your health care provider right away. Green zone Symptoms in this zone mean that you are doing well. They include: Being able to do your usual activities and exercise. Having the usual amount of coughing, including the same amount of phlegm or mucus. Being able to sleep well. Having a  good appetite. Where to find more information: You can find more information about COPD from: American Lung Association, My COPD Action Plan: www.lung.org COPD Foundation: www.copdfoundation.[place]: https://wilson-eaton.com/ Follow these instructions at home: Continue taking your daily medicines as told by your health care provider. Make sure you receive all the immunizations that your health care provider recommends, especially the pneumococcal and influenza vaccines. Wash your hands often with soap and water. Have family members wash their hands too. Regular hand washing can help prevent infections. Follow  your usual exercise and diet plan. Avoid irritants in the air, such as smoke. Do not use any products that contain nicotine or tobacco. These products include cigarettes, chewing tobacco, and vaping devices, such as e-cigarettes. If you need help quitting, ask your health care provider. Summary A COPD action plan tells you what to do when you have a flare (exacerbation) of chronic obstructive pulmonary disease (COPD). Follow each action plan for your symptoms. If you have any symptoms in the red zone, call emergency services (911 in the [place] or go to the nearest emergency room. This information is not intended to replace advice given to you by your health care provider. Make sure you discuss any questions you have with your health care provider. Document Revised: 06/20/2020 Document Reviewed: 06/20/2020 Elsevier Patient Education  2022 Elsevier Inc. Low-Sodium Eating Plan Sodium, which is an element that makes up salt, helps you maintain a healthy balance of fluids in your body. Too much sodium can increase your blood pressure and cause fluid and waste to be held in your body. Your health care provider or dietitian may recommend following this plan if you have high blood pressure (hypertension), kidney disease, liver disease, or heart failure. Eating less  sodium can help lower your blood pressure, reduce swelling, and protect your heart, liver, and kidneys. What are tips for following this plan? Reading food labels The Nutrition Facts label lists the amount of sodium in one serving of the food. If you eat more than one serving, you must multiply the listed amount of sodium by the number of servings. Choose foods with less than 140 mg of sodium per serving. Avoid foods with 300 mg of sodium or more per serving. Shopping  Look for lower-sodium products, often labeled as "low-sodium" or "no salt added." Always check the sodium content, even if foods are labeled as "unsalted" or "no salt added." Buy fresh foods. Avoid canned foods and pre-made or frozen meals. Avoid canned, cured, or processed meats. Buy breads that have less than 80 mg of sodium per slice. Cooking  Eat more home-cooked food and less restaurant, buffet, and fast food. Avoid adding salt when cooking. Use salt-free seasonings or herbs instead of table salt or sea salt. Check with your health care provider or pharmacist before using salt substitutes. Cook with plant-based oils, such as canola, sunflower, or olive oil. Meal planning When eating at a restaurant, ask that your food be prepared with less salt or no salt, if possible. Avoid dishes labeled as brined, pickled, cured, smoked, or made with soy sauce, miso, or teriyaki sauce. Avoid foods that contain MSG (monosodium glutamate). MSG is sometimes added to Chinese food, bouillon, and some canned foods. Make meals that can be grilled, baked, poached, roasted, or steamed. These are generally made with less sodium. General information Most people on this plan should limit their sodium intake to 1,500-2,000 mg (milligrams) of sodium each day. What foods should I eat? Fruits Fresh, frozen, or canned fruit. Fruit juice. Vegetables Fresh or frozen vegetables. "No salt added" canned vegetables. "No salt added" tomato sauce and  paste. Low-sodium or reduced-sodium tomato and vegetable juice. Grains Low-sodium cereals, including oats, puffed wheat and rice, and shredded wheat. Low-sodium crackers. Unsalted rice. Unsalted pasta. Low-sodium bread. Whole-grain breads and whole-grain pasta. Meats and other proteins Fresh or frozen (no salt added) meat, poultry, seafood, and fish. Low-sodium canned tuna and salmon. Unsalted nuts. Dried peas, beans, and lentils without added salt. Unsalted canned beans. Eggs. Unsalted nut butters.   Dairy Milk. Soy milk. Cheese that is naturally low in sodium, such as ricotta cheese, fresh mozzarella, or Swiss cheese. Low-sodium or reduced-sodium cheese. Cream cheese. Yogurt. Seasonings and condiments Fresh and dried herbs and spices. Salt-free seasonings. Low-sodium mustard and ketchup. Sodium-free salad dressing. Sodium-free light mayonnaise. Fresh or refrigerated horseradish. Lemon juice. Vinegar. Other foods Homemade, reduced-sodium, or low-sodium soups. Unsalted popcorn and pretzels. Low-salt or salt-free chips. The items listed above may not be a complete list of foods and beverages you can eat. Contact a dietitian for more information. What foods should I avoid? Vegetables Sauerkraut, pickled vegetables, and relishes. Olives. French fries. Onion rings. Regular canned vegetables (not low-sodium or reduced-sodium). Regular canned tomato sauce and paste (not low-sodium or reduced-sodium). Regular tomato and vegetable juice (not low-sodium or reduced-sodium). Frozen vegetables in sauces. Grains Instant hot cereals. Bread stuffing, pancake, and biscuit mixes. Croutons. Seasoned rice or pasta mixes. Noodle soup cups. Boxed or frozen macaroni and cheese. Regular salted crackers. Self-rising flour. Meats and other proteins Meat or fish that is salted, canned, smoked, spiced, or pickled. Precooked or cured meat, such as sausages or meat loaves. Bacon. Ham. Pepperoni. Hot dogs. Corned beef. Chipped  beef. Salt pork. Jerky. Pickled herring. Anchovies and sardines. Regular canned tuna. Salted nuts. Dairy Processed cheese and cheese spreads. Hard cheeses. Cheese curds. Blue cheese. Feta cheese. String cheese. Regular cottage cheese. Buttermilk. Canned milk. Fats and oils Salted butter. Regular margarine. Ghee. Bacon fat. Seasonings and condiments Onion salt, garlic salt, seasoned salt, table salt, and sea salt. Canned and packaged gravies. Worcestershire sauce. Tartar sauce. Barbecue sauce. Teriyaki sauce. Soy sauce, including reduced-sodium. Steak sauce. Fish sauce. Oyster sauce. Cocktail sauce. Horseradish that you find on the shelf. Regular ketchup and mustard. Meat flavorings and tenderizers. Bouillon cubes. Hot sauce. Pre-made or packaged marinades. Pre-made or packaged taco seasonings. Relishes. Regular salad dressings. Salsa. Other foods Salted popcorn and pretzels. Corn chips and puffs. Potato and tortilla chips. Canned or dried soups. Pizza. Frozen entrees and pot pies. The items listed above may not be a complete list of foods and beverages you should avoid. Contact a dietitian for more information. Summary Eating less sodium can help lower your blood pressure, reduce swelling, and protect your heart, liver, and kidneys. Most people on this plan should limit their sodium intake to 1,500-2,000 mg (milligrams) of sodium each day. Canned, boxed, and frozen foods are high in sodium. Restaurant foods, fast foods, and pizza are also very high in sodium. You also get sodium by adding salt to food. Try to cook at home, eat more fresh fruits and vegetables, and eat less fast food and canned, processed, or prepared foods. This information is not intended to replace advice given to you by your health care provider. Make sure you discuss any questions you have with your health care provider. Document Revised: 09/17/2019 Document Reviewed: 07/14/2019 Elsevier Patient Education  2022 Elsevier  Inc. Advance Directive Advance directives are legal documents that allow you to make decisions about your health care and medical treatment in case you become unable to communicate for yourself. Advance directives let your wishes be known to family, friends, and health care providers. Discussing and writing advance directives should happen over time rather than all at once. Advance directives can be changed and updated at any time. There are different types of advance directives, such as: Medical power of attorney. Living will. Do not resuscitate (DNR) order or do not attempt resuscitation (DNAR) order. Health care proxy and medical power of   attorney A health care proxy is also called a health care agent. This person is appointed to make medical decisions for you when you are unable to make decisions for yourself. Generally, people ask a trusted friend or family member to act as their proxy and represent their preferences. Make sure you have an agreement with your trusted person to act as your proxy. A proxy may have to make a medical decision on your behalf if your wishes are not known. A medical power of attorney, also called a durable power of attorney for health care, is a legal document that names your health care proxy. Depending on the laws in your state, the document may need to be: Signed. Notarized. Dated. Copied. Witnessed. Incorporated into your medical record. You may also want to appoint a trusted person to manage your money in the event you are unable to do so. This is called a durable power of attorney for finances. It is a separate legal document from the durable power of attorney for health care. You may choose your health care proxy or someone different to act as your agent in money matters. If you do not appoint a proxy, or there is a concern that the proxy is not acting in your best interest, a court may appoint a guardian to act on your behalf. Living will A living will is a  set of instructions that state your wishes about medical care when you cannot express them yourself. Health care providers should keep a copy of your living will in your medical record. You may want to give a copy to family members or friends. To alert caregivers in case of an emergency, you can place a card in your wallet to let them know that you have a living will and where they can find it. A living will is used if you become: Terminally ill. Disabled. Unable to communicate or make decisions. The following decisions should be included in your living will: To use or not to use life support equipment, such as dialysis machines and breathing machines (ventilators). Whether you want a DNR or DNAR order. This tells health care providers not to use cardiopulmonary resuscitation (CPR) if breathing or heartbeat stops. To use or not to use tube feeding. To be given or not to be given food and fluids. Whether you want comfort (palliative) care when the goal becomes comfort rather than a cure. Whether you want to donate your organs and tissues. A living will does not give instructions for distributing your money and property if you should pass away. DNR or DNAR A DNR or DNAR order is a request not to have CPR in the event that your heart stops beating or you stop breathing. If a DNR or DNAR order has not been made and shared, a health care provider will try to help any patient whose heart has stopped or who has stopped breathing. If you plan to have surgery, talk with your health care provider about how your DNR or DNAR order will be followed if problems occur. What if I do not have an advance directive? Some states assign family decision makers to act on your behalf if you do not have an advance directive. Each state has its own laws about advance directives. You may want to check with your health care provider, attorney, or state representative about the laws in your state. Summary Advance directives are  legal documents that allow you to make decisions about your health care and medical treatment   in case you become unable to communicate for yourself. The process of discussing and writing advance directives should happen over time. You can change and update advance directives at any time. Advance directives may include a medical power of attorney, a living will, and a DNR or DNAR order. This information is not intended to replace advice given to you by your health care provider. Make sure you discuss any questions you have with your health care provider. Document Revised: 05/16/2020 Document Reviewed: 05/16/2020 Elsevier Patient Education  2022 Elsevier Inc.   Julie Farmer RNC, BSN RN Case Manager [facility] [phone]   CLINICAL CARE PLAN: Patient Care Plan: Hypertension (Adult)     Problem Identified: Hypertension (Hypertension)      Long-Range Goal: Hypertension Monitored   Start Date: 05/30/2021  Expected End Date: 11/28/2021  This Visit's Progress: On track  Priority: Low  Note:   Objective:  Last practice recorded BP readings:  BP Readings from Last 3 Encounters:  02/28/21 134/70  11/10/20 120/64  08/30/20 110/70   Most recent eGFR/CrCl: No results found for: EGFR  No components found for: CRCL Current Barriers:  Knowledge Deficits related to basic understanding of hypertension pathophysiology and self care management- patient lives with adult son who is primary caregiver, pt has CNA from [employer] that sees her M-F 11am-3pm and son pays out of pocket for one hour on Saturday to assist with bathing patient.  Pt is able to transfer from wheelchair to bed, to commode etc and can walk short distances with assistance of another person, wheelchair and gait belt.  Pt had difficulty swallowing several weeks back which has now improved and speech therapy (home health) has been working with patient and to see her again on 06/04/21.  Son reports CNA monitors patient's  blood pressure as well as he does (but this is not daily), visiting RN from palliative care (once month) completes assessment and [facility] RN visits one monthly also.  Son reports they have adequate resources at present. Son requests advanced directives packet be mailed. Unable to perform ADLs independently- son provides oversight with all aspects of patient's care including medications Unable to perform IADLs independently- son assists Case Manager Clinical Goal(s):  patient will verbalize understanding of plan for hypertension management patient will attend all scheduled medical appointments: Rebekah Pennington  09/03/21 patient will demonstrate improved adherence to prescribed treatment plan for hypertension as evidenced by taking all medications as prescribed, monitoring and recording blood pressure as directed, adhering to low sodium/DASH diet Interventions:  Collaboration with Luking, Scott A, MD regarding development and update of comprehensive plan of care as evidenced by provider attestation and co-signature Inter-disciplinary care team collaboration (see longitudinal plan of care) Evaluation of current treatment plan related to hypertension self management and patient's adherence to plan as established by provider. Provided education to patient re: stroke prevention, s/s of heart attack and stroke, DASH diet, complications of uncontrolled blood pressure Reviewed medications with patient and discussed importance of compliance Discussed plans with patient for ongoing care management follow up and provided patient with direct contact information for care management team Advised patient, providing education and rationale, to monitor blood pressure 3 x week and record, calling PCP for findings outside established parameters.  Reviewed scheduled/upcoming provider appointments including: Rebekah Pennington  09/03/21 Mailed advanced directives packet and education - low sodium diet Self-Care  Activities: Attends all scheduled provider appointments Calls provider office for new concerns, questions, or BP outside discussed parameters Checks BP and records   as discussed Follows a low sodium diet/DASH diet Patient Goals: - check blood pressure 3 times per week - write blood pressure results in a log or diary  - follow low sodium diet- check labels, use fresh/ frozen foods whenever possible - look over information mailed- low sodium diet  - advanced directives packet mailed - call RN care manager if any questions at [phone] Follow Up Plan: Telephone follow up appointment with care management team member scheduled for:   07/18/2021    Patient Care Plan: COPD (Adult)     Problem Identified: Symptom Exacerbation (COPD)      Long-Range Goal: Symptom Exacerbation Prevented or Minimized   Start Date: 05/30/2021  Expected End Date: 11/28/2021  This Visit's Progress: On track  Priority: Medium  Note:   Current Barriers:  Knowledge deficits related to basic understanding of COPD disease process- patient/ caregiver needs teaching/ reinforcement COPD action plan Unable to perform ADLs independently- son assists Unable to perform IADLs independently- son assists Case Manager Clinical Goal(s): patient will report using inhalers as prescribed including rinsing mouth after use patient will verbalize understanding of COPD action plan and when to seek appropriate levels of medical care  Interventions:  Collaboration with [name], MD regarding development and update of comprehensive plan of care as evidenced by provider attestation and co-signature Inter-disciplinary care team collaboration (see longitudinal plan of care) Provided patient with basic written and verbal COPD education on self care/management/and exacerbation prevention  Provided patient with COPD action plan and reinforced importance of daily self assessment Provided instruction about proper use of medications used  for management of COPD including inhalers Advised patient to self assesses COPD action plan zone and make appointment with provider if in the yellow zone for 48 hours without improvement. Provided education about and advised patient to utilize infection prevention strategies to reduce risk of respiratory infection  Mailed copy of COPD action plan Self-Care Activities:  Attends all scheduled provider appointments Patient Goals: - develop a rescue plan - eliminate symptom triggers at home - follow rescue plan if symptoms flare-up- call your doctor early on for change in health status/ symptoms - keep follow-up appointments  - use inhalers as presribed - please see education mailed- COPD action plan - do breathing exercises every day - eat healthy - get at least 7 to 8 hours of sleep at night - get outdoors every day (weather permitting) Follow Up Plan: Telephone follow up appointment with care management team member scheduled for:   07/18/2021

## 2021-05-31 NOTE — Telephone Encounter (Signed)
Barnett Applebaum returned call and does have verbal orders for speech therapy and will be out to see pt Monday

## 2021-05-31 NOTE — Telephone Encounter (Signed)
Left message to return call 

## 2021-06-11 DIAGNOSIS — J441 Chronic obstructive pulmonary disease with (acute) exacerbation: Secondary | ICD-10-CM | POA: Diagnosis not present

## 2021-06-11 DIAGNOSIS — R1312 Dysphagia, oropharyngeal phase: Secondary | ICD-10-CM | POA: Diagnosis not present

## 2021-06-11 DIAGNOSIS — M6281 Muscle weakness (generalized): Secondary | ICD-10-CM | POA: Diagnosis not present

## 2021-06-11 DIAGNOSIS — J431 Panlobular emphysema: Secondary | ICD-10-CM | POA: Diagnosis not present

## 2021-06-20 ENCOUNTER — Other Ambulatory Visit: Payer: Self-pay

## 2021-06-20 ENCOUNTER — Other Ambulatory Visit (INDEPENDENT_AMBULATORY_CARE_PROVIDER_SITE_OTHER): Payer: Medicare Other

## 2021-06-20 ENCOUNTER — Telehealth: Payer: Self-pay

## 2021-06-20 ENCOUNTER — Other Ambulatory Visit: Payer: Medicare Other

## 2021-06-20 DIAGNOSIS — D500 Iron deficiency anemia secondary to blood loss (chronic): Secondary | ICD-10-CM | POA: Diagnosis not present

## 2021-06-20 LAB — POCT HEMOGLOBIN: Hemoglobin: 13.4 g/dL (ref 11–14.6)

## 2021-06-20 NOTE — Telephone Encounter (Signed)
I am very hopeful that her hemoglobin will stay up I think Carol Massey could space this out to 6 weeks if he feels comfortable with doing so.  If he does not then do his hemoglobin in 4 weeks thanks

## 2021-06-20 NOTE — Telephone Encounter (Signed)
Patient was in today for a hemoglobin check 13.4 , please advise when next visit should be.

## 2021-06-21 NOTE — Telephone Encounter (Signed)
Patient' son has been informed , nurse visit has been scheduled .

## 2021-06-25 DIAGNOSIS — J431 Panlobular emphysema: Secondary | ICD-10-CM

## 2021-06-25 DIAGNOSIS — I10 Essential (primary) hypertension: Secondary | ICD-10-CM

## 2021-06-27 ENCOUNTER — Other Ambulatory Visit: Payer: Self-pay | Admitting: Family Medicine

## 2021-06-27 DIAGNOSIS — E876 Hypokalemia: Secondary | ICD-10-CM

## 2021-06-27 DIAGNOSIS — I10 Essential (primary) hypertension: Secondary | ICD-10-CM

## 2021-06-27 DIAGNOSIS — D500 Iron deficiency anemia secondary to blood loss (chronic): Secondary | ICD-10-CM

## 2021-06-27 DIAGNOSIS — J431 Panlobular emphysema: Secondary | ICD-10-CM

## 2021-07-02 ENCOUNTER — Telehealth: Payer: Self-pay | Admitting: Family Medicine

## 2021-07-02 NOTE — Telephone Encounter (Signed)
Barnett Applebaum contacted and verbalized understanding. Also needing verbal orders for RN one week one for disease process and education. Asked PCP in real time and PCP gave verbal OK.

## 2021-07-02 NOTE — Telephone Encounter (Signed)
Please advise. Thank you

## 2021-07-02 NOTE — Telephone Encounter (Signed)
Barnett Applebaum with [facility] needs verbal orders for speech therapy for patient.   [phone]

## 2021-07-02 NOTE — Telephone Encounter (Signed)
Please give verbal orders thank you

## 2021-07-09 ENCOUNTER — Telehealth: Payer: Self-pay | Admitting: Family Medicine

## 2021-07-09 NOTE — Telephone Encounter (Signed)
Script written out; once signed will fax to [employer]

## 2021-07-09 NOTE — Telephone Encounter (Signed)
[facility] home [facility] nurse is requesting a prescription for jelly pillow for patient for wheelchair to help with her wound care prevention and with transport. Prescription sent [employer]. Any question call [phone] Barnett Applebaum-

## 2021-07-09 NOTE — Telephone Encounter (Signed)
Nurses that would be fine If needed a written message/prescription please do I will sign

## 2021-07-10 NOTE — Telephone Encounter (Signed)
Script faxed to [facility] 

## 2021-07-18 ENCOUNTER — Ambulatory Visit (INDEPENDENT_AMBULATORY_CARE_PROVIDER_SITE_OTHER): Payer: Medicare Other | Admitting: *Deleted

## 2021-07-18 NOTE — Patient Instructions (Addendum)
Visit Information  Thank you for taking time to visit with me today. Please don't hesitate to contact me if I can be of assistance to you before our next scheduled telephone appointment.  Following are the goals we discussed today:  Take medications as prescribed   Attend all scheduled provider appointments Call provider office for new concerns or questions  identify and remove indoor air pollutants do breathing exercises every day  get at least 7 to 8 hours of sleep at night check blood pressure 3 times per week write blood pressure results in a log or diary develop an action plan for high blood pressure Follow low sodium diet- avoid fast food, salty snacks Follow safety precautions- keep pathways clear Follow aspiration precautions for swallowing  Our next appointment is by telephone on 09/12/2021 at 915 am  Please call the care guide team at [phone] if you need to cancel or reschedule your appointment.   Please call [phone]-TALK (toll free, 24 hour hotline) go to [facility] [address] [place] [phone]) call 911 if you are experiencing a Mental Health or [place] or need someone to talk to.  The patient verbalized understanding of instructions, educational materials, and care plan provided today and declined offer to receive copy of patient instructions, educational materials, and care plan.   Jacqlyn Larsen [facility], BSN RN Case Manager [facility] [phone]

## 2021-07-18 NOTE — Chronic Care Management (AMB) (Signed)
Chronic Care Management   CCM RN Visit Note  07/18/2021 Name: Carol Massey MRN: [id number] DOB: 08/19/1926  Subjective: Carol Massey is a 87 y.o. year old female who is a primary care patient of Luking, Scott A, MD. The care management team was consulted for assistance with disease management and care coordination needs.    Engaged with patient by telephone for follow up visit in response to provider referral for case management and/or care coordination services.   Consent to Services:  The patient was given information about Chronic Care Management services, agreed to services, and gave verbal consent prior to initiation of services.  Please see initial visit note for detailed documentation.   Patient agreed to services and verbal consent obtained.   Assessment: Review of patient past medical history, allergies, medications, health status, including review of consultants reports, laboratory and other test data, was performed as part of comprehensive evaluation and provision of chronic care management services.   SDOH (Social Determinants of Health) assessments and interventions performed:    CCM Care Plan  No Known Allergies  Outpatient Encounter Medications as of 07/18/2021  Medication Sig Note   Acetaminophen (TYLENOL EXTRA STRENGTH PO) Take by mouth. prn 03/08/2021: One tid   albuterol (VENTOLIN HFA) 108 (90 Base) MCG/ACT inhaler INHALE 2 PUFFS INTO THE LUNGS EVERY 4 HOURS AS NEEDED FOR WHEEZING OR SHORTNESS OF BREATH.    ALPRAZolam (XANAX) 0.25 MG tablet TAKE 1/2 TABLET BY MOUTH IN THE MORNING AND 1 TO 2 TABLETS AT BEDTIME AS NEEDED FOR SLEEP.    amLODipine (NORVASC) 2.5 MG tablet Take 1 tablet (2.5 mg total) by mouth daily.    Ascorbic Acid (VITAMIN C) 1000 MG tablet Take 1,000 mg by mouth daily.    Calcium Carbonate Antacid (TUMS PO) Take by mouth. 3 -4 a day    citalopram (CELEXA) 10 MG tablet Take 1 tablet (10 mg total) by mouth daily.    Cyanocobalamin (VITAMIN B 12 PO) Take  1,000 mcg by mouth every other day.     OVER THE COUNTER MEDICATION Vit C one daily One a day multi vitamin Calcium 600mg + D  Vit b12 1,000 mcg. One every other day    OVER THE COUNTER MEDICATION Multivitamin qd    OVER THE COUNTER MEDICATION Calcium 600mg plus Vit D3 20mcg qd    pantoprazole (PROTONIX) 40 MG tablet TAKE (1) TABLET BY MOUTH TWICE DAILY.    potassium chloride 20 MEQ/15ML (10%) SOLN DILUTE 15 ML IN 8 OUNCES OF WATER OR JUICE AND TAKE ONCE DAILY.    sucralfate (CARAFATE) 1 g tablet TAKE 1 TABLET BY MOUTH 3 TIMES DAILY AS NEEDED. MAY BREAK UP AND MIX IN WATER.    SYMBICORT 80-4.5 MCG/ACT inhaler INHALE 2 PUFFS INTO THE LUNGS TWICE DAILY.    tiotropium (SPIRIVA HANDIHALER) 18 MCG inhalation capsule Place 1 capsule (18 mcg total) into inhaler and inhale daily.    meclizine (ANTIVERT) 25 MG tablet Take one every 8 hours prn vertigo. Caution drowiness (Patient not taking: Reported on 05/30/2021)    potassium chloride SA (KLOR-CON) 20 MEQ tablet Take 1 tablet (20 mEq total) by mouth daily. (Patient not taking: Reported on 05/30/2021)    Zoster Vaccine Adjuvanted (SHINGRIX) injection Shingrix (PF) 50 mcg/0.5 mL intramuscular suspension, kit (Patient not taking: Reported on 05/30/2021)    Facility-Administered Encounter Medications as of 07/18/2021  Medication   sodium chloride flush (NS) 0.9 % injection 10 mL    Patient Active Problem List     Diagnosis Date Noted   LUQ abdominal pain 09/22/2019   Nausea without vomiting 09/22/2019   Belching 09/22/2019   Constipation 05/18/2019   Abdominal pain 05/18/2019   Vomiting 05/18/2019   Intertrochanteric fracture of right hip ([place]) 12/28/2018   Closed right hip fracture, initial encounter ([place]) 12/27/2018   Anemia, chronic disease 01/21/2018   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   Essential hypertension 12/12/2016   History of peptic ulcer disease    Ataxia 12/01/2015   PUD (peptic ulcer disease)    Melena 11/27/2015   Upper GI bleed  11/27/2015   Tobacco abuse 11/27/2015   Azotemia 11/27/2015   Right-sided chest pain 11/27/2015   Dysphagia    Generalized anxiety disorder 07/24/2015   Major depression in remission ([place]) 07/24/2015   Anemia due to GI blood loss 05/27/2014   Hyperlipemia 03/18/2013   COPD (chronic obstructive pulmonary disease) ([place]) 01/12/2013   Osteoporosis 01/12/2013    Conditions to be addressed/monitored:HTN and COPD  Care Plan : RN Care Manager plan of care  Updates made by Kassie Mends, RN since 07/18/2021 12:00 AM     Problem: No plan of care established for management of chronic disease states (HTN, COPD)   Priority: High     Long-Range Goal: Development of plan of care for chronic disease management (HTN, COPD)   Priority: High  Note:   Current Barriers:  Knowledge Deficits related to plan of care for management of HTN and COPD  No Advanced Directives in place - per son, received advanced directives packet, has not completed  Knowledge Deficits related to basic understanding of hypertension pathophysiology and self care management- patient lives with adult son who is primary caregiver, pt has CNA from [place] that sees her M-F 11am-3pm and son pays out of pocket for one hour on Saturday to assist with bathing patient.  Pt is able to transfer from wheelchair to bed, to commode etc and can walk short distances with assistance of another person, wheelchair and gait belt.  Pt had difficulty swallowing several weeks back which has now improved somewhat and speech therapy (home health) has been working with patient and saw patient for the discharge visit this week.  Son reports CNA monitors patient's blood pressure as well as he does (but this is not daily), visiting RN from palliative care (once month) completes assessment and [place] calls monthly also.  Son reports they have adequate resources at present. Son reports received advanced directives packet but has not  completed. Knowledge deficits related to basic understanding of COPD disease process- patient/ caregiver needs teaching/ reinforcement COPD action plan Unable to perform ADLs independently- son provides oversight with all aspects of patient's care including medications Unable to perform IADLs independently- son assists  RNCM Clinical Goal(s):  Patient will verbalize understanding of plan for management of HTN and COPD as evidenced by patient/ son report, review of EHR and  through collaboration with RN Care manager, provider, and care team.  Interventions: 1:1 collaboration with primary care provider regarding development and update of comprehensive plan of care as evidenced by provider attestation and co-signature Inter-disciplinary care team collaboration (see longitudinal plan of care) Evaluation of current treatment plan related to  self management and patient's adherence to plan as established by provider  Hypertension Interventions: Last practice recorded BP readings:  BP Readings from Last 3 Encounters:  02/28/21 134/70  11/10/20 120/64  08/30/20 110/70  Most recent eGFR/CrCl: No results found for: EGFR  No components found for: CRCL  Evaluation of current treatment plan related to hypertension self management and patient's adherence to plan as established by provider; Reviewed medications with patient and discussed importance of compliance; Discussed complications of poorly controlled blood pressure such as heart disease, stroke, circulatory complications, vision complications, kidney impairment, sexual dysfunction;  Reinforced low sodium diet Encouraged son to check patient's blood pressure at least 3 x per week and record  COPD Interventions:   Advised patient to self assesses COPD action plan zone and make appointment with provider if in the yellow zone for 48 hours without improvement; Provided education about and advised patient to utilize infection prevention strategies to  reduce risk of respiratory infection; Discussed the importance of adequate rest and management of fatigue with COPD; Reviewed good handwashing practices, wearing a mask as needed   Patient Goals/Self-Care Activities: Take medications as prescribed   Attend all scheduled provider appointments Call provider office for new concerns or questions  - identify and remove indoor air pollutants - do breathing exercises every day - get at least 7 to 8 hours of sleep at night check blood pressure 3 times per week write blood pressure results in a log or diary develop an action plan for high blood pressure Follow low sodium diet- avoid fast food, salty snacks Follow safety precautions- keep pathways clear Follow aspiration precautions for swallowing  Follow Up Plan:  Telephone follow up appointment with care management team member scheduled for:  09/12/2021       Plan:Telephone follow up appointment with care management team member scheduled for:  09/12/2021  Julie Farmer RNC, BSN RN Case Manager [facility] [phone]          

## 2021-07-25 DIAGNOSIS — I10 Essential (primary) hypertension: Secondary | ICD-10-CM | POA: Diagnosis not present

## 2021-07-25 DIAGNOSIS — J431 Panlobular emphysema: Secondary | ICD-10-CM | POA: Diagnosis not present

## 2021-07-26 ENCOUNTER — Other Ambulatory Visit: Payer: Self-pay | Admitting: Family Medicine

## 2021-07-26 DIAGNOSIS — E876 Hypokalemia: Secondary | ICD-10-CM

## 2021-07-26 DIAGNOSIS — J431 Panlobular emphysema: Secondary | ICD-10-CM

## 2021-07-26 DIAGNOSIS — D500 Iron deficiency anemia secondary to blood loss (chronic): Secondary | ICD-10-CM

## 2021-07-26 DIAGNOSIS — I10 Essential (primary) hypertension: Secondary | ICD-10-CM

## 2021-08-02 ENCOUNTER — Other Ambulatory Visit (INDEPENDENT_AMBULATORY_CARE_PROVIDER_SITE_OTHER): Payer: Medicare Other | Admitting: *Deleted

## 2021-08-02 ENCOUNTER — Other Ambulatory Visit: Payer: Self-pay

## 2021-08-02 DIAGNOSIS — D500 Iron deficiency anemia secondary to blood loss (chronic): Secondary | ICD-10-CM | POA: Diagnosis not present

## 2021-08-02 LAB — POCT HEMOGLOBIN: Hemoglobin: 12.2 g/dL (ref 11–14.6)

## 2021-08-02 NOTE — Progress Notes (Signed)
Results for orders placed or performed in visit on 08/02/21  POCT hemoglobin  Result Value Ref Range   Hemoglobin 12.2 11 - 14.6 g/dL   Consult with Dr Nicki Reaper: hemoglobin good- Recheck in 6 weeks-sooner if any problems. Son(DPR) notified.

## 2021-08-04 ENCOUNTER — Other Ambulatory Visit: Payer: Self-pay | Admitting: Family Medicine

## 2021-08-13 ENCOUNTER — Other Ambulatory Visit: Payer: Self-pay | Admitting: Family Medicine

## 2021-08-13 DIAGNOSIS — E876 Hypokalemia: Secondary | ICD-10-CM

## 2021-08-13 DIAGNOSIS — I10 Essential (primary) hypertension: Secondary | ICD-10-CM

## 2021-08-13 DIAGNOSIS — D500 Iron deficiency anemia secondary to blood loss (chronic): Secondary | ICD-10-CM

## 2021-08-13 DIAGNOSIS — J431 Panlobular emphysema: Secondary | ICD-10-CM

## 2021-08-16 ENCOUNTER — Telehealth: Payer: Self-pay | Admitting: Family Medicine

## 2021-08-16 DIAGNOSIS — R1310 Dysphagia, unspecified: Secondary | ICD-10-CM

## 2021-08-16 NOTE — Telephone Encounter (Signed)
May have referral as requested

## 2021-08-16 NOTE — Telephone Encounter (Signed)
Shalyn from [facility] called and stated the son had called her requesting services. [facility] needs orders. [phone]

## 2021-08-16 NOTE — Telephone Encounter (Signed)
Patient's son Carol Massey) is requesting a referral for speech therapy be sent into [facility] again. Please advise

## 2021-08-17 NOTE — Telephone Encounter (Signed)
Referral ordered in EPIC. 

## 2021-08-17 NOTE — Telephone Encounter (Signed)
Son called and stated he wants to go with [facility]. Referral ordered in Epic.

## 2021-08-24 ENCOUNTER — Encounter (HOSPITAL_COMMUNITY): Payer: Self-pay | Admitting: Emergency Medicine

## 2021-08-24 ENCOUNTER — Telehealth: Payer: Self-pay | Admitting: Family Medicine

## 2021-08-24 ENCOUNTER — Emergency Department (HOSPITAL_COMMUNITY): Payer: Medicare Other

## 2021-08-24 ENCOUNTER — Emergency Department (HOSPITAL_COMMUNITY)
Admission: EM | Admit: 2021-08-24 | Discharge: 2021-08-24 | Disposition: A | Discharge status: Home / Self Care | Payer: Medicare Other | Attending: Emergency Medicine | Admitting: Emergency Medicine

## 2021-08-24 ENCOUNTER — Other Ambulatory Visit: Payer: Self-pay

## 2021-08-24 DIAGNOSIS — R030 Elevated blood-pressure reading, without diagnosis of hypertension: Secondary | ICD-10-CM

## 2021-08-24 DIAGNOSIS — I10 Essential (primary) hypertension: Secondary | ICD-10-CM | POA: Diagnosis not present

## 2021-08-24 DIAGNOSIS — Z79899 Other long term (current) drug therapy: Secondary | ICD-10-CM | POA: Diagnosis not present

## 2021-08-24 DIAGNOSIS — Z87891 Personal history of nicotine dependence: Secondary | ICD-10-CM | POA: Insufficient documentation

## 2021-08-24 DIAGNOSIS — J45909 Unspecified asthma, uncomplicated: Secondary | ICD-10-CM | POA: Insufficient documentation

## 2021-08-24 DIAGNOSIS — I491 Atrial premature depolarization: Secondary | ICD-10-CM | POA: Diagnosis not present

## 2021-08-24 DIAGNOSIS — Z85828 Personal history of other malignant neoplasm of skin: Secondary | ICD-10-CM | POA: Diagnosis not present

## 2021-08-24 DIAGNOSIS — J449 Chronic obstructive pulmonary disease, unspecified: Secondary | ICD-10-CM | POA: Diagnosis not present

## 2021-08-24 DIAGNOSIS — Z7951 Long term (current) use of inhaled steroids: Secondary | ICD-10-CM | POA: Diagnosis not present

## 2021-08-24 DIAGNOSIS — I160 Hypertensive urgency: Secondary | ICD-10-CM | POA: Diagnosis not present

## 2021-08-24 DIAGNOSIS — R519 Headache, unspecified: Secondary | ICD-10-CM | POA: Diagnosis present

## 2021-08-24 LAB — CBC WITH DIFFERENTIAL/PLATELET
Abs Immature Granulocytes: 0.01 10*3/uL (ref 0.00–0.07)
Basophils Absolute: 0.1 10*3/uL (ref 0.0–0.1)
Basophils Relative: 2 %
Eosinophils Absolute: 0.1 10*3/uL (ref 0.0–0.5)
Eosinophils Relative: 2 %
HCT: 37.7 % (ref 36.0–46.0)
Hemoglobin: 12.4 g/dL (ref 12.0–15.0)
Immature Granulocytes: 0 %
Lymphocytes Relative: 28 %
Lymphs Abs: 1.4 10*3/uL (ref 0.7–4.0)
MCH: 32.6 pg (ref 26.0–34.0)
MCHC: 32.9 g/dL (ref 30.0–36.0)
MCV: 99.2 fL (ref 80.0–100.0)
Monocytes Absolute: 0.5 10*3/uL (ref 0.1–1.0)
Monocytes Relative: 9 %
Neutro Abs: 3 10*3/uL (ref 1.7–7.7)
Neutrophils Relative %: 59 %
Platelets: 230 10*3/uL (ref 150–400)
RBC: 3.8 MIL/uL — ABNORMAL LOW (ref 3.87–5.11)
RDW: 12.9 % (ref 11.5–15.5)
WBC: 5 10*3/uL (ref 4.0–10.5)
nRBC: 0 % (ref 0.0–0.2)

## 2021-08-24 LAB — PROTIME-INR
INR: 1 (ref 0.8–1.2)
Prothrombin Time: 13.2 seconds (ref 11.4–15.2)

## 2021-08-24 LAB — BASIC METABOLIC PANEL
Anion gap: 8 (ref 5–15)
BUN: 8 mg/dL (ref 8–23)
CO2: 26 mmol/L (ref 22–32)
Calcium: 9.6 mg/dL (ref 8.9–10.3)
Chloride: 102 mmol/L (ref 98–111)
Creatinine, Ser: 0.85 mg/dL (ref 0.44–1.00)
GFR, Estimated: >60 mL/min (ref >60)
Glucose, Bld: 106 mg/dL — ABNORMAL HIGH (ref 70–99)
Potassium: 4.1 mmol/L (ref 3.5–5.1)
Sodium: 136 mmol/L (ref 135–145)

## 2021-08-24 LAB — APTT: aPTT: 29 seconds (ref 24–36)

## 2021-08-24 LAB — TROPONIN I (HIGH SENSITIVITY)
Troponin I (High Sensitivity): 6 ng/L (ref <18)
Troponin I (High Sensitivity): 6 ng/L (ref <18)

## 2021-08-24 MED ORDER — IOHEXOL 350 MG/ML SOLN
75.0000 mL | Freq: Once | INTRAVENOUS | Status: AC | PRN
  Administered 2021-08-24: 09:00:00 75 mL via INTRAVENOUS

## 2021-08-24 NOTE — ED Notes (Signed)
Son at bedside, reports pt has recent difficulty swallowing. Airway patent. Pt reported neck pain at this time while swallowing, denies headache.

## 2021-08-24 NOTE — Telephone Encounter (Signed)
Patient is being released today so when do you want to see her  HP follow up visit . Will it be a home visit or office please advise where to add to schedule

## 2021-08-24 NOTE — ED Triage Notes (Signed)
Pt arrives via EMS from home for c/o hypertension and headache. Reports feeling "fuzzy headed" at times. Hx of hypertension, taking medications. 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 Noted with swelling to left side of neck, but son states it has been this way for 1.5 years. Pt HOH. Lives with son

## 2021-08-24 NOTE — ED Provider Notes (Addendum)
[place] Provider Note   CSN: [id number] Arrival date & time: 08/24/21  2376     History Chief Complaint  Patient presents with   Hypertension    Carol Massey is a 87 y.o. female.  Patient is a 87 yo female with PMH of htn, chronic anemia, asthma, CVA, and GAD receiving home palliative care presenting for evaluation for hypertension. Patient's son is accompanying pt and contributes to hx. States patient awoke this morning with headache and home BP reading of >200/. Patient denies chest pain or sob. Denies vision changes. Compliant on home Norvasc. Denies motor/sensation deficits, slurred speech, or facial assymetry. Denies recent falls or head trauma.   The history is provided by the patient and a relative. No language interpreter was used.  Hypertension Associated symptoms include headaches and shortness of breath. Pertinent negatives include no chest pain and no abdominal pain.      Past Medical History:  Diagnosis Date   Anemia, chronic disease 01/21/2018   Arthritis    Asthma    COPD (chronic obstructive pulmonary disease) ([place])    Dieulafoy lesion of colon 2006   Gastric AVM 2014   Generalized anxiety disorder 07/24/2015   Hard of hearing    Hypertension    Iron deficiency anemia due to chronic blood loss 05/27/2014   Kidney stone    Major depression in remission ([place]) 07/24/2015   Osteoporosis    PUD (peptic ulcer disease) 11/27/2015   Gastric and duodenal ulcer on EGD   Skin cancer    Status post dilation of esophageal narrowing    Stroke ([place])    Unsteady gait    Upper GI bleed 11/27/2015    Patient Active Problem List   Diagnosis Date Noted   LUQ abdominal pain 09/22/2019   Nausea without vomiting 09/22/2019   Belching 09/22/2019   Constipation 05/18/2019   Abdominal pain 05/18/2019   Vomiting 05/18/2019   Intertrochanteric fracture of right hip ([place]) 12/28/2018   Closed right hip fracture, initial encounter ([place]) 12/27/2018    Anemia, chronic disease 01/21/2018   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   Essential hypertension 12/12/2016   History of peptic ulcer disease    Ataxia 12/01/2015   PUD (peptic ulcer disease)    Melena 11/27/2015   Upper GI bleed 11/27/2015   Tobacco abuse 11/27/2015   Azotemia 11/27/2015   Right-sided chest pain 11/27/2015   Dysphagia    Generalized anxiety disorder 07/24/2015   Major depression in remission ([place]) 07/24/2015   Anemia due to GI blood loss 05/27/2014   Hyperlipemia 03/18/2013   COPD (chronic obstructive pulmonary disease) ([place]) 01/12/2013   Osteoporosis 01/12/2013    Past Surgical History:  Procedure Laterality Date   ABDOMINAL HYSTERECTOMY     APPENDECTOMY     COLONOSCOPY  03/2005   Dr. Gala Romney: rectal and rectosigmoid polyps removed   COLONOSCOPY WITH ESOPHAGOGASTRODUODENOSCOPY (EGD) N/A 08/12/2013   Dr. Gala Romney: colonoscopy with tubular adenoma, colonic diverticulosis. EGD with non-critical Schatzki's ring, non-manipulated, and gastric AVM with evidence of recent bleeding, s/p thermal sealing and hemostasis clip   ESOPHAGEAL DILATION  11/27/2015   Procedure: ESOPHAGEAL DILATION;  Surgeon: Daneil Dolin, MD;  Location: [facility];  Service: Endoscopy;;   ESOPHAGOGASTRODUODENOSCOPY  03/2005   Dr. Gala Romney: tiny distal esophageal erosions   ESOPHAGOGASTRODUODENOSCOPY N/A 11/27/2015   RMR: Cricopharyngeus web, status post dilation. Nonbleeding duodenal and gastric ulcers without stigmata of bleeding. Gastric biopsies benign   ESOPHAGOGASTRODUODENOSCOPY N/A 02/14/2016   Procedure: ESOPHAGOGASTRODUODENOSCOPY (EGD);  Surgeon: Daneil Dolin, MD;  Location: [facility];  Service: Endoscopy;  Laterality: N/A;  300   GIVENS CAPSULE STUDY N/A 11/27/2015   NOT DONE, WAS NOT INDICATED AT THE TIME   INTRAMEDULLARY (IM) NAIL INTERTROCHANTERIC Right 12/28/2018   Procedure: INTRAMEDULLARY (IM) NAIL INTERTROCHANTRIC;  Surgeon: Paralee Cancel, MD;  Location: [facility];  Service: Orthopedics;   Laterality: Right;   MALONEY DILATION N/A 02/14/2016   Procedure: Venia Minks DILATION;  Surgeon: Daneil Dolin, MD;  Location: [facility];  Service: Endoscopy;  Laterality: N/A;   small bowel capsule study  03/2005   Dr. Laural Golden: two small Dieulafoy lesions at ascending colon. tiney specks of blood at duodenal bulb but no identified lesions   TONSILLECTOMY       OB History   No obstetric history on file.     Family History  Problem Relation Age of Onset   Hypertension Mother    Stroke Mother    Leukemia Father 54   Lung cancer Father    Prostate cancer Son    Melanoma Son    Squamous cell carcinoma Son    Heart attack Daughter    Hypertension Daughter    Tremor Daughter    Colon cancer Neg Hx     Social History   Tobacco Use   Smoking status: Former    Packs/day: 0.50    Years: 70.00    Pack years: 35.00    Types: Cigarettes    Quit date: 08/26/2014    Years since quitting: 7.0   Smokeless tobacco: Never   Tobacco comments:    about 1/2 ppd  Vaping Use   Vaping Use: Never used  Substance Use Topics   Alcohol use: No    Alcohol/week: 0.0 standard drinks   Drug use: No    Home Medications Prior to Admission medications   Medication Sig Start Date End Date Taking? Authorizing Provider  Acetaminophen (TYLENOL EXTRA STRENGTH PO) Take 500 mg by mouth in the morning, at noon, and at bedtime.   Yes [provider]  albuterol (VENTOLIN HFA) 108 (90 Base) MCG/ACT inhaler INHALE 2 PUFFS INTO THE LUNGS EVERY 4 HOURS AS NEEDED FOR WHEEZING OR SHORTNESS OF BREATH. 03/08/21  Yes Luking, Elayne Snare, MD  ALPRAZolam (XANAX) 0.25 MG tablet TAKE 1/2 TABLET BY MOUTH IN THE MORNING AND 1 TO 2 TABLETS AT BEDTIME AS NEEDED FOR SLEEP. Patient taking differently: TAKE 1/2 TABLET BY MOUTH IN THE MORNING AND 1/2 TABLETS AT BEDTIME AS NEEDED FOR SLEEP. 03/27/21  Yes Luking, Scott A, MD  amLODipine (NORVASC) 2.5 MG tablet TAKE 1 TABLET BY MOUTH ONCE A DAY. 08/14/21  Yes [name], MD   Calcium Carbonate Antacid (TUMS PO) Take 1 tablet by mouth every 6 (six) hours as needed. 3 -4 a day   Yes [provider]  citalopram (CELEXA) 10 MG tablet Take 1 tablet (10 mg total) by mouth daily. 02/15/21  Yes Luking, Elayne Snare, MD  Cyanocobalamin (VITAMIN B 12 PO) Take 1,000 mcg by mouth every other day.    Yes [provider]  Multiple Vitamins-Minerals (EMERGEN-C IMMUNE PO) Take 1 Package by mouth daily.   Yes [provider]  OVER THE COUNTER MEDICATION Take 1 tablet by mouth daily. Multivitamin qd   Yes [provider]  OVER THE COUNTER MEDICATION Take 1 tablet by mouth daily. Calcium 623m plus Vit D3 221m qd   Yes [provider]  pantoprazole (PROTONIX) 40 MG tablet TAKE (1)  TABLET BY MOUTH TWICE DAILY. 02/15/21  Yes Luking, Scott A, MD  potassium chloride 20 MEQ/15ML (10%) SOLN DILUTE 15 ML IN 8 OUNCES OF WATER OR JUICE AND TAKE ONCE DAILY. 08/06/21  Yes [name], MD  SPIRIVA HANDIHALER 18 MCG inhalation capsule INHALE 1 PUFF INTO THE LUNGS DAILY 07/26/21  Yes Luking, Scott A, MD  sucralfate (CARAFATE) 1 g tablet TAKE 1 TABLET BY MOUTH 3 TIMES DAILY AS NEEDED. MAY BREAK UP AND MIX IN WATER. 03/27/21  Yes Luking, Elayne Snare, MD  SYMBICORT 80-4.5 MCG/ACT inhaler INHALE 2 PUFFS INTO THE LUNGS TWICE DAILY. 06/28/21  Yes [name], MD  Ascorbic Acid (VITAMIN C) 1000 MG tablet Take 1,000 mg by mouth daily. Patient not taking: Reported on 08/24/2021    [provider]  meclizine (ANTIVERT) 25 MG tablet Take one every 8 hours prn vertigo. Caution drowiness Patient not taking: Reported on 05/30/2021 11/29/19   [name], MD  OVER THE COUNTER MEDICATION Vit C one daily One a day multi vitamin Calcium 649m + D  Vit b12 1,000 mcg. One every other day Patient not taking: Reported on 08/24/2021    [provider]  potassium chloride SA (KLOR-CON) 20 MEQ tablet Take 1 tablet (20 mEq total) by mouth daily. Patient not taking:  Reported on 05/30/2021 02/15/21   LKathyrn Drown MD  Zoster Vaccine Adjuvanted ([facility] injection Shingrix (PF) 50 mcg/0.5 mL intramuscular suspension, kit Patient not taking: Reported on 05/30/2021    [provider]    Allergies    Patient has no known allergies.  Review of Systems   Review of Systems  Constitutional:  Negative for chills and fever.  HENT:  Negative for ear pain and sore throat.   Eyes:  Negative for pain and visual disturbance.  Respiratory:  Positive for cough and shortness of breath.   Cardiovascular:  Negative for chest pain and palpitations.  Gastrointestinal:  Negative for abdominal pain and vomiting.  Genitourinary:  Negative for dysuria and hematuria.  Musculoskeletal:  Negative for arthralgias and back pain.  Skin:  Negative for color change and rash.  Neurological:  Positive for headaches. Negative for seizures and syncope.  All other systems reviewed and are negative.  Physical Exam Updated Vital Signs BP 137/78    Pulse 80    Temp 98.3 F (36.8 C) (Oral)    Resp (!) 30    Ht _0  (1.473 m)    Wt 41.1 kg    LMP  (LMP Unknown)    SpO2 94%    BMI 18.96 kg/m   Physical Exam Vitals and nursing note reviewed.  Constitutional:      General: She is not in acute distress.    Appearance: She is well-developed.  HENT:     Head: Normocephalic and atraumatic.  Eyes:     General: Lids are normal.     Conjunctiva/sclera: Conjunctivae normal.     Pupils: Pupils are equal, round, and reactive to light.     Comments: Abnormal vision-baseline  Cardiovascular:     Rate and Rhythm: Normal rate and regular rhythm.     Heart sounds: No murmur heard. Pulmonary:     Effort: Pulmonary effort is normal. No respiratory distress.     Breath sounds: Normal breath sounds.  Abdominal:     Palpations: Abdomen is soft.     Tenderness: There is no abdominal tenderness.  Musculoskeletal:        General: No swelling.  Cervical back: Neck supple.  Skin:     General: Skin is warm and dry.     Capillary Refill: Capillary refill takes less than 2 seconds.  Neurological:     Mental Status: She is alert and oriented to person, place, and time.     GCS: GCS eye subscore is 4. GCS verbal subscore is 5. GCS motor subscore is 6.     Cranial Nerves: Cranial nerves 2-12 are intact.     Sensory: Sensation is intact.     Motor: Motor function is intact.  Psychiatric:        Mood and Affect: Mood normal.    ED Results / Procedures / Treatments   Labs (all labs ordered are listed, but only abnormal results are displayed) Labs Reviewed  CBC WITH DIFFERENTIAL/PLATELET - Abnormal; Notable for the following components:      Result Value   RBC 3.80 (*)    All other components within normal limits  BASIC METABOLIC PANEL - Abnormal; Notable for the following components:   Glucose, Bld 106 (*)    All other components within normal limits  PROTIME-INR  APTT  TROPONIN I (HIGH SENSITIVITY)  TROPONIN I (HIGH SENSITIVITY)    EKG EKG Interpretation  Date/Time:  Friday August 24 2021 07:35:57 EST Ventricular Rate:  82 PR Interval:    QRS Duration: 93 QT Interval:  366 QTC Calculation: 428 R Axis:   6 Text Interpretation: Atrial fibrillation not seen on previous ecgs Abnormal R-wave progression, early transition Confirmed by Campbell Stall ([id number]) on 62/94/7654 8:35:14 AM  Radiology CT Angio Head W/Cm &/Or Wo Cm  Result Date: 08/24/2021 CLINICAL DATA:  Dizziness, non-specific EXAM: CT ANGIOGRAPHY HEAD TECHNIQUE: Multidetector CT imaging of the head was performed using the standard protocol during bolus administration of intravenous contrast. Multiplanar CT image reconstructions and MIPs were obtained to evaluate the vascular anatomy. CONTRAST:  63m OMNIPAQUE IOHEXOL 350 MG/ML SOLN COMPARISON:  CT head 2018 FINDINGS: CT HEAD Brain: There is no acute intracranial hemorrhage, mass effect, or edema. Emely Fahy-white differentiation is preserved. There is no  extra-axial fluid collection. Prominence of the ventricles and sulci reflects parenchymal volume loss. Patchy hypoattenuation in the supratentorial white matter is nonspecific but probably reflects similar chronic microvascular ischemic changes. Vascular: Better evaluated on CTA portion. Skull: Calvarium is unremarkable. Sinuses/Orbits: No acute finding. Other: None. CTA HEAD Anterior circulation: Intracranial internal carotid arteries are patent. Calcified plaque is present without significant stenosis. Anterior and middle cerebral arteries are patent. Posterior circulation: Intracranial vertebral arteries are patent. Basilar artery is patent. Major cerebellar artery origins are patent. Small bilateral posterior communicating arteries are present with infundibular origins. Posterior cerebral arteries are patent. There is high-grade stenosis of the distal right P2 PCA. Venous sinuses: As permitted by contrast timing, patent. Review of the MIP images confirms the above findings. IMPRESSION: No acute intracranial abnormality. Chronic microvascular ischemic changes. No proximal intracranial vessel occlusion. High-grade stenosis of the distal right P2 PCA. Electronically Signed   By: PMacy MisM.D.   On: 08/24/2021 09:24   DG Chest Portable 1 View  Result Date: 08/24/2021 CLINICAL DATA:  Headaches. EXAM: PORTABLE CHEST 1 VIEW COMPARISON:  February 28, 2021 FINDINGS: The heart size and mediastinal contours are stable. Both lungs are clear. Hiatal hernia is identified unchanged the visualized skeletal structures are stable. IMPRESSION: No active cardiopulmonary disease. Electronically Signed   By: WAbelardo DieselM.D.   On: 08/24/2021 08:28    Procedures Procedures   Medications Ordered in  ED Medications  iohexol (OMNIPAQUE) 350 MG/ML injection 75 mL (75 mLs Intravenous Contrast Given 08/24/21 0853)    ED Course  I have reviewed the triage vital signs and the nursing notes.  Pertinent labs & imaging  results that were available during my care of the patient were reviewed by me and considered in my medical decision making (see chart for details).    MDM Rules/Calculators/A&P                           87 yo female presenting for evaluation of headache and hypertension. Pt is Aox3, no acute distress, afebrile, with stable vitals. Physical exam demonstrates no neurolgoical deficits.   Hypertensive Urgency -BP 154/70 on arrival. Reported 200/ at home. Took Norvasc prior to arrival. -No evidence of end organ damage. Stable trops and renal function -Pt admits to headache, feeling "fuzzy", and feelings of "just not feeling right" upon waking. Went to bed >9 hours ago.. No neuro deficits on exam. CT head demonstrates no acute process. CTA stable. - ECG stable. Concern for afib. No prior hx of afib. Stable troponin, bnp, and cxr. Cardiology consulted and recommends repeat ECG. No afib. PACs. -BP continues to remain under 160/ in ED.   Patient in no distress and overall condition improved here in the ED. Detailed discussions were had with the patient regarding current findings, and need for close f/u with PCP for blood pressure management. The patient has been instructed to return immediately if the symptoms worsen in any way for re-evaluation. Patient verbalized understanding and is in agreement with current care plan. All questions answered prior to discharge.    Final Clinical Impression(s) / ED Diagnoses Final diagnoses:  Elevated blood pressure reading  Hypertensive urgency    Rx / DC Orders ED Discharge Orders     None        Lianne Cure, DO 83/72/90 2111    Lianne Cure, DO 55/20/80 2207

## 2021-08-24 NOTE — ED Notes (Signed)
Pt noted to be Afib on monitor. No hx of dx. EDP notified, EKG already perform and given to provider. Denies chest pain. RR elevated to 30s as well. EDP aware

## 2021-08-24 NOTE — Telephone Encounter (Signed)
Pt son called and stated the pt is currently at the ER with Atrial fibrillation. Sons number is [phone]. He stated he does not think she has had this before but wanted to let us know in case it happens again.

## 2021-08-24 NOTE — Telephone Encounter (Signed)
Noted We were able to review the ER notes Follow-up based on how things go

## 2021-08-24 NOTE — Consult Note (Signed)
CARDIOLOGY CONSULT NOTE       Patient ID: Carol Massey MRN: [id number] DOB/AGE: 1926-08-02 87 y.o.  Admit date: 08/24/2021 Referring Physician: Dominica Severin [facility] Primary Physician: [name], MD Primary Cardiologist: New Reason for Consultation: ? Afib  Active Problems:   * No active hospital problems. *   HPI:  87 y.o. palliative patient brought to ER by son. MS changes not acting right with weakness and fatigue. She is blind and hears poorly not ambulatory. She has had a stroke and falls with previous right hip nailing in 2020. She also has history of anemia with gastric AVM;s cauterized. Telemetry showed irregular rhythm. To my review she is in sinus with PACls I see P waves in lead V3 on her ECG despite artifact She has no chest pain mild dyspnea chronic and no syncope She is not an anticoagulation candidate for reasons stated above and HRls in ER are 70-80 bpm  ROS All other systems reviewed and negative except as noted above  Past Medical History:  Diagnosis Date   Anemia, chronic disease 01/21/2018   Arthritis    Asthma    COPD (chronic obstructive pulmonary disease) ([place])    Dieulafoy lesion of colon 2006   Gastric AVM 2014   Generalized anxiety disorder 07/24/2015   Hard of hearing    Hypertension    Iron deficiency anemia due to chronic blood loss 05/27/2014   Kidney stone    Major depression in remission ([place]) 07/24/2015   Osteoporosis    PUD (peptic ulcer disease) 11/27/2015   Gastric and duodenal ulcer on EGD   Skin cancer    Status post dilation of esophageal narrowing    Stroke ([place])    Unsteady gait    Upper GI bleed 11/27/2015    Family History  Problem Relation Age of Onset   Hypertension Mother    Stroke Mother    Leukemia Father 64   Lung cancer Father    Prostate cancer Son    Melanoma Son    Squamous cell carcinoma Son    Heart attack Daughter    Hypertension Daughter    Tremor Daughter    Colon cancer Neg Hx     Social History   Socioeconomic  History   Marital status: Married    Spouse name: Not on file   Number of children: 3   Years of education: Not on file   Highest education level: Not on file  Occupational History   Occupation: retired from [employer] Use   Smoking status: Former    Packs/day: 0.50    Years: 70.00    Pack years: 35.00    Types: Cigarettes    Quit date: 08/26/2014    Years since quitting: 7.0   Smokeless tobacco: Never   Tobacco comments:    about 1/2 ppd  Vaping Use   Vaping Use: Never used  Substance and Sexual Activity   Alcohol use: No    Alcohol/week: 0.0 standard drinks   Drug use: No   Sexual activity: Not on file    Comment: married  Other Topics Concern   Not on file  Social History Narrative   Lives with husband and son   Caffeine use: 1 cup a day of coffee   Diet coke- 16oz daily   Right handed   Social Determinants of Health   Financial Resource Strain: Not on file  Food Insecurity: No Food Insecurity   Worried About Charity fundraiser in  the Last Year: Never true   Ran Out of Food in the Last Year: Never true  Transportation Needs: No Transportation Needs   Lack of Transportation (Medical): No   Lack of Transportation (Non-Medical): No  Physical Activity: Not on file  Stress: Not on file  Social Connections: Not on file  Intimate Partner Violence: Not on file    Past Surgical History:  Procedure Laterality Date   ABDOMINAL HYSTERECTOMY     APPENDECTOMY     COLONOSCOPY  03/2005   Dr. Gala Romney: rectal and rectosigmoid polyps removed   COLONOSCOPY WITH ESOPHAGOGASTRODUODENOSCOPY (EGD) N/A 08/12/2013   Dr. Gala Romney: colonoscopy with tubular adenoma, colonic diverticulosis. EGD with non-critical Schatzki's ring, non-manipulated, and gastric AVM with evidence of recent bleeding, s/p thermal sealing and hemostasis clip   ESOPHAGEAL DILATION  11/27/2015   Procedure: ESOPHAGEAL DILATION;  Surgeon: Daneil Dolin, MD;  Location: [facility];  Service: Endoscopy;;    ESOPHAGOGASTRODUODENOSCOPY  03/2005   Dr. Gala Romney: tiny distal esophageal erosions   ESOPHAGOGASTRODUODENOSCOPY N/A 11/27/2015   RMR: Cricopharyngeus web, status post dilation. Nonbleeding duodenal and gastric ulcers without stigmata of bleeding. Gastric biopsies benign   ESOPHAGOGASTRODUODENOSCOPY N/A 02/14/2016   Procedure: ESOPHAGOGASTRODUODENOSCOPY (EGD);  Surgeon: Daneil Dolin, MD;  Location: [facility];  Service: Endoscopy;  Laterality: N/A;  300   GIVENS CAPSULE STUDY N/A 11/27/2015   NOT DONE, WAS NOT INDICATED AT THE TIME   INTRAMEDULLARY (IM) NAIL INTERTROCHANTERIC Right 12/28/2018   Procedure: INTRAMEDULLARY (IM) NAIL INTERTROCHANTRIC;  Surgeon: Paralee Cancel, MD;  Location: [facility];  Service: Orthopedics;  Laterality: Right;   MALONEY DILATION N/A 02/14/2016   Procedure: Venia Minks DILATION;  Surgeon: Daneil Dolin, MD;  Location: [facility];  Service: Endoscopy;  Laterality: N/A;   small bowel capsule study  03/2005   Dr. Laural Golden: two small Dieulafoy lesions at ascending colon. tiney specks of blood at duodenal bulb but no identified lesions   TONSILLECTOMY       No current facility-administered medications for this encounter.  Current Outpatient Medications:    Acetaminophen (TYLENOL EXTRA STRENGTH PO), Take 500 mg by mouth in the morning, at noon, and at bedtime., Disp: , Rfl:    albuterol (VENTOLIN HFA) 108 (90 Base) MCG/ACT inhaler, INHALE 2 PUFFS INTO THE LUNGS EVERY 4 HOURS AS NEEDED FOR WHEEZING OR SHORTNESS OF BREATH., Disp: 8.5 each, Rfl: 4   ALPRAZolam (XANAX) 0.25 MG tablet, TAKE 1/2 TABLET BY MOUTH IN THE MORNING AND 1 TO 2 TABLETS AT BEDTIME AS NEEDED FOR SLEEP. (Patient taking differently: TAKE 1/2 TABLET BY MOUTH IN THE MORNING AND 1/2 TABLETS AT BEDTIME AS NEEDED FOR SLEEP.), Disp: 45 tablet, Rfl: 4   amLODipine (NORVASC) 2.5 MG tablet, TAKE 1 TABLET BY MOUTH ONCE A DAY., Disp: 90 tablet, Rfl: 0   Calcium Carbonate Antacid (TUMS PO), Take 1 tablet by mouth every 6 (six)  hours as needed. 3 -4 a day, Disp: , Rfl:    citalopram (CELEXA) 10 MG tablet, Take 1 tablet (10 mg total) by mouth daily., Disp: 90 tablet, Rfl: 1   Cyanocobalamin (VITAMIN B 12 PO), Take 1,000 mcg by mouth every other day. , Disp: , Rfl:    Multiple Vitamins-Minerals (EMERGEN-C IMMUNE PO), Take 1 Package by mouth daily., Disp: , Rfl:    OVER THE COUNTER MEDICATION, Take 1 tablet by mouth daily. Multivitamin qd, Disp: , Rfl:    OVER THE COUNTER MEDICATION, Take 1 tablet by mouth daily. Calcium 675m plus Vit D3  64mg qd, Disp: , Rfl:    pantoprazole (PROTONIX) 40 MG tablet, TAKE (1) TABLET BY MOUTH TWICE DAILY., Disp: 180 tablet, Rfl: 1   potassium chloride 20 MEQ/15ML (10%) SOLN, DILUTE 15 ML IN 8 OUNCES OF WATER OR JUICE AND TAKE ONCE DAILY., Disp: 450 mL, Rfl: 0   SPIRIVA HANDIHALER 18 MCG inhalation capsule, INHALE 1 PUFF INTO THE LUNGS DAILY, Disp: 90 capsule, Rfl: 0   sucralfate (CARAFATE) 1 g tablet, TAKE 1 TABLET BY MOUTH 3 TIMES DAILY AS NEEDED. MAY BREAK UP AND MIX IN WATER., Disp: 180 tablet, Rfl: 3   SYMBICORT 80-4.5 MCG/ACT inhaler, INHALE 2 PUFFS INTO THE LUNGS TWICE DAILY., Disp: 30.6 g, Rfl: 0   Ascorbic Acid (VITAMIN C) 1000 MG tablet, Take 1,000 mg by mouth daily. (Patient not taking: Reported on 08/24/2021), Disp: , Rfl:    meclizine (ANTIVERT) 25 MG tablet, Take one every 8 hours prn vertigo. Caution drowiness (Patient not taking: Reported on 05/30/2021), Disp: 20 tablet, Rfl: 2   OVER THE COUNTER MEDICATION, Vit C one daily One a day multi vitamin Calcium 606m+ D  Vit b12 1,000 mcg. One every other day (Patient not taking: Reported on 08/24/2021), Disp: , Rfl:    potassium chloride SA (KLOR-CON) 20 MEQ tablet, Take 1 tablet (20 mEq total) by mouth daily. (Patient not taking: Reported on 05/30/2021), Disp: 90 tablet, Rfl: 1   Zoster Vaccine Adjuvanted (SHINGRIX) injection, Shingrix (PF) 50 mcg/0.5 mL intramuscular suspension, kit (Patient not taking: Reported on 05/30/2021), Disp:  , Rfl:   Facility-Administered Medications Ordered in Other Encounters:    sodium chloride flush (NS) 0.9 % injection 10 mL, 10 mL, Intracatheter, PRN, KeB[facility] CancerPA-C    Physical Exam: Blood pressure 137/71, pulse 75, temperature 98.4 F (36.9 C), temperature source Oral, resp. rate 19, height _0  (1.473 m), weight 41.1 kg, SpO2 93 %.   Frail elderly female Previous right hip surgery SEM of AV sclerosis  Lungs clear No bruit No edema Poor vision and hearing  Muscle wasting   Labs:   Lab Results  Component Value Date   WBC 5.0 08/24/2021   HGB 12.4 08/24/2021   HCT 37.7 08/24/2021   MCV 99.2 08/24/2021   PLT 230 08/24/2021    Recent Labs  Lab 08/24/21 0808  NA 136  K 4.1  CL 102  CO2 26  BUN 8  CREATININE 0.85  CALCIUM 9.6  GLUCOSE 106*   Lab Results  Component Value Date   CKTOTAL 137 03/15/2013   CKMB 3.6 03/15/2013   TROPONINI <0.03 01/26/2016    Lab Results  Component Value Date   CHOL 183 12/03/2014   CHOL 164 03/24/2013   Lab Results  Component Value Date   HDL 88 12/03/2014   HDL 67 03/24/2013   Lab Results  Component Value Date   LDLCALC 79 12/03/2014   LDLCALC 74 03/24/2013   Lab Results  Component Value Date   TRIG 81 12/03/2014   TRIG 114 03/24/2013   Lab Results  Component Value Date   CHOLHDL 2.1 12/03/2014   CHOLHDL 2.4 03/24/2013   No results found for: LDLDIRECT    Radiology: CT Angio Head W/Cm &/Or Wo Cm  Result Date: 08/24/2021 CLINICAL DATA:  Dizziness, non-specific EXAM: CT ANGIOGRAPHY HEAD TECHNIQUE: Multidetector CT imaging of the head was performed using the standard protocol during bolus administration of intravenous contrast. Multiplanar CT image reconstructions and MIPs were obtained to evaluate the vascular anatomy. CONTRAST:  7540m  OMNIPAQUE IOHEXOL 350 MG/ML SOLN COMPARISON:  CT head 2018 FINDINGS: CT HEAD Brain: There is no acute intracranial hemorrhage, mass effect, or edema. Gray-white  differentiation is preserved. There is no extra-axial fluid collection. Prominence of the ventricles and sulci reflects parenchymal volume loss. Patchy hypoattenuation in the supratentorial white matter is nonspecific but probably reflects similar chronic microvascular ischemic changes. Vascular: Better evaluated on CTA portion. Skull: Calvarium is unremarkable. Sinuses/Orbits: No acute finding. Other: None. CTA HEAD Anterior circulation: Intracranial internal carotid arteries are patent. Calcified plaque is present without significant stenosis. Anterior and middle cerebral arteries are patent. Posterior circulation: Intracranial vertebral arteries are patent. Basilar artery is patent. Major cerebellar artery origins are patent. Small bilateral posterior communicating arteries are present with infundibular origins. Posterior cerebral arteries are patent. There is high-grade stenosis of the distal right P2 PCA. Venous sinuses: As permitted by contrast timing, patent. Review of the MIP images confirms the above findings. IMPRESSION: No acute intracranial abnormality. Chronic microvascular ischemic changes. No proximal intracranial vessel occlusion. High-grade stenosis of the distal right P2 PCA. Electronically Signed   By: Macy Mis M.D.   On: 08/24/2021 09:24   DG Chest Portable 1 View  Result Date: 08/24/2021 CLINICAL DATA:  Headaches. EXAM: PORTABLE CHEST 1 VIEW COMPARISON:  February 28, 2021 FINDINGS: The heart size and mediastinal contours are stable. Both lungs are clear. Hiatal hernia is identified unchanged the visualized skeletal structures are stable. IMPRESSION: No active cardiopulmonary disease. Electronically Signed   By: Abelardo Diesel M.D.   On: 08/24/2021 08:28    EKG: SR PACls to my read not afib   ASSESSMENT AND PLAN:   Arrhythmia:  to my review sinus with PACls asked ER to repeat ECG with less artifact but telemetry clearly shows P waves. Her rates are fine and she is not an  anticoagulation candidate being fall risk/bed bound, palliative, low body weight , age and history of bleeding gastric AVM's No need for cardiac f/u BP is improved She can f/u with Dr Therisa Doyne still thinks there are MS changes CT head negative  Plan per ER physician   Signed: Jenkins Rouge 08/24/2021, 1:23 PM

## 2021-08-25 ENCOUNTER — Other Ambulatory Visit: Payer: Self-pay | Admitting: Family Medicine

## 2021-08-25 DIAGNOSIS — D500 Iron deficiency anemia secondary to blood loss (chronic): Secondary | ICD-10-CM

## 2021-08-25 DIAGNOSIS — I10 Essential (primary) hypertension: Secondary | ICD-10-CM

## 2021-08-25 DIAGNOSIS — E876 Hypokalemia: Secondary | ICD-10-CM

## 2021-08-25 DIAGNOSIS — J431 Panlobular emphysema: Secondary | ICD-10-CM

## 2021-08-27 NOTE — Telephone Encounter (Signed)
You may coordinate a home visit with Carol Massey-her son-it could be on a afternoon that I am here and schedule this as a 430 visit Home visit-Carol Massey should be aware that I would not do the home visit anywhere between 4:15 PM and 530 depending on the office flow

## 2021-08-28 ENCOUNTER — Telehealth: Payer: Self-pay | Admitting: Family Medicine

## 2021-08-28 NOTE — Telephone Encounter (Signed)
Pt son called and wanted to know if Dr Nicki Reaper had a chance to look at the test his mother had (pt son left 2 messages Friday). He stated she is acting very confused. [phone]

## 2021-08-28 NOTE — Telephone Encounter (Signed)
Nurses Please find previous message We were trying to set up home visit as a follow-up at 430 on a given day for this week-I sent a message regarding that? Confusion could be related to her underlying health issues age etc. Certainly if he feels anything emergent is going on going back to the ER seems reasonable depending on their own assessment It is difficult for me to state via phone May set up home visit on a given day that a 430 appointment is available or 1140 appointment available(not my half day)

## 2021-08-28 NOTE — Telephone Encounter (Signed)
Son scheduled home visit for patient tomorrow 08/29/21 at 4:30 with Dr Nicki Reaper and was advised Confusion could be related to her underlying health issues age etc. Certainly if he feels anything emergent is going on going back to the ER seems reasonable depending on their own assessment. Son verbalized understanding.

## 2021-08-29 ENCOUNTER — Inpatient Hospital Stay (HOSPITAL_COMMUNITY): Payer: Medicare Other

## 2021-08-29 ENCOUNTER — Telehealth: Payer: Self-pay | Admitting: Family Medicine

## 2021-08-29 ENCOUNTER — Ambulatory Visit: Payer: Medicare Other | Admitting: Family Medicine

## 2021-08-29 NOTE — Telephone Encounter (Signed)
Thanks for the update

## 2021-08-29 NOTE — Telephone Encounter (Signed)
FYIJoelene Massey from [facility] just wanted [place] to know that the referral was called for 08/16/21 and we placed the order Friday 08/17/21 and it was forwarded to them by referral coordinator 08/22/21 but their intake person was on vacation last week with the holiday and it was not processed till yesterday when she returned from holiday. She stated she tried to explain to son about the holidays but he was very upset in the delay of care and she just wanted to let us know what happened

## 2021-08-29 NOTE — Telephone Encounter (Signed)
Referral was sent per [facility] 08/22/21

## 2021-08-29 NOTE — Telephone Encounter (Signed)
Shela Commons, Manager from [facility] called stating pt son "blessed them out". She would like a call from a nurse here. [phone]

## 2021-08-30 ENCOUNTER — Other Ambulatory Visit: Payer: Self-pay | Admitting: *Deleted

## 2021-08-30 ENCOUNTER — Inpatient Hospital Stay (HOSPITAL_COMMUNITY): Payer: Medicare Other | Attending: Hematology

## 2021-08-30 ENCOUNTER — Other Ambulatory Visit: Payer: Self-pay

## 2021-08-30 DIAGNOSIS — I10 Essential (primary) hypertension: Secondary | ICD-10-CM | POA: Insufficient documentation

## 2021-08-30 DIAGNOSIS — Z23 Encounter for immunization: Secondary | ICD-10-CM | POA: Insufficient documentation

## 2021-08-30 DIAGNOSIS — D500 Iron deficiency anemia secondary to blood loss (chronic): Secondary | ICD-10-CM | POA: Insufficient documentation

## 2021-08-30 DIAGNOSIS — Q2733 Arteriovenous malformation of digestive system vessel: Secondary | ICD-10-CM | POA: Insufficient documentation

## 2021-08-30 DIAGNOSIS — E538 Deficiency of other specified B group vitamins: Secondary | ICD-10-CM

## 2021-08-30 LAB — CBC WITH DIFFERENTIAL/PLATELET
Abs Immature Granulocytes: 0 10*3/uL (ref 0.00–0.07)
Basophils Absolute: 0.1 10*3/uL (ref 0.0–0.1)
Basophils Relative: 2 %
Eosinophils Absolute: 0.1 10*3/uL (ref 0.0–0.5)
Eosinophils Relative: 1 %
HCT: 38 % (ref 36.0–46.0)
Hemoglobin: 12.3 g/dL (ref 12.0–15.0)
Immature Granulocytes: 0 %
Lymphocytes Relative: 22 %
Lymphs Abs: 1.1 10*3/uL (ref 0.7–4.0)
MCH: 32.7 pg (ref 26.0–34.0)
MCHC: 32.4 g/dL (ref 30.0–36.0)
MCV: 101.1 fL — ABNORMAL HIGH (ref 80.0–100.0)
Monocytes Absolute: 0.5 10*3/uL (ref 0.1–1.0)
Monocytes Relative: 10 %
Neutro Abs: 3.2 10*3/uL (ref 1.7–7.7)
Neutrophils Relative %: 65 %
Platelets: 240 10*3/uL (ref 150–400)
RBC: 3.76 MIL/uL — ABNORMAL LOW (ref 3.87–5.11)
RDW: 12.8 % (ref 11.5–15.5)
WBC: 5 10*3/uL (ref 4.0–10.5)
nRBC: 0 % (ref 0.0–0.2)

## 2021-08-30 LAB — VITAMIN B12: Vitamin B-12: 1484 pg/mL — ABNORMAL HIGH (ref 180–914)

## 2021-08-30 LAB — IRON AND TIBC
Iron: 78 ug/dL (ref 28–170)
Saturation Ratios: 24 % (ref 10.4–31.8)
TIBC: 324 ug/dL (ref 250–450)
UIBC: 246 ug/dL

## 2021-08-30 LAB — FERRITIN: Ferritin: 24 ng/mL (ref 11–307)

## 2021-08-30 LAB — FOLATE: Folate: 25.2 ng/mL (ref >5.9)

## 2021-08-30 NOTE — Telephone Encounter (Signed)
Joelene Millin from palliative care seen patient yesterday. She states that the patient didn't know who she was (she says she always knows who she is) and swelling on left side of her neck. She states that patient's son would like to have a carotid doppler study done.  Joelene Millin also states she knows that a CT of patients head was done at the ER visit and it was negative but son would like to have another one done because patient is still confused, thinks maybe she had a stroke and it didn't show up. Her son is also giving her Extra Strength Tylenol 3 times a day for headache and neck pain, wants to know if there is something stronger that can be given to the patient. He also would like to get a refill on Meclizine.

## 2021-08-31 LAB — HOMOCYSTEINE: Homocysteine: 13.5 umol/L (ref 0.0–21.3)

## 2021-08-31 MED ORDER — TRAMADOL HCL 50 MG PO TABS: ORAL_TABLET | ORAL | 0 refills | Status: DC

## 2021-08-31 MED ORDER — MECLIZINE HCL 25 MG PO TABS
ORAL_TABLET | ORAL | 0 refills | Status: DC
Start: 2021-08-31 — End: 2021-09-10

## 2021-08-31 NOTE — Telephone Encounter (Signed)
Autumn- The ER actually did a CTA which showed carotids ok Although I could do a home visit next week I prefer to wait till after 09/07/21 due to myself having resp illness I will wear a n95 when I go the following week Can have a an appt the week of Jan 16 at 4:30

## 2021-08-31 NOTE — Telephone Encounter (Signed)
Meds signed off on

## 2021-08-31 NOTE — Telephone Encounter (Signed)
Son (DPR)  notified and stated stated he wanted to keep the 24th appt but also scheduled for the 16th to hold it in case she needs to be seen earlier. Patient states the palliative nurse is coming next week and he will cancel one of the appts based on her assessment and will take patient to the ER if any further problems.  Son stated the palliative nurse recommends a mild pain medication such as tramadol for her headaches and would like to reinstate vertigo medication- Please advise

## 2021-08-31 NOTE — Telephone Encounter (Signed)
Telephone call-voicemail not set up ?

## 2021-08-31 NOTE — Telephone Encounter (Signed)
Medications pended and son (DPR) is aware. Please advise. Thank you

## 2021-08-31 NOTE — Telephone Encounter (Signed)
May use meclizine 25 mg one tid prn #28 may cause drowsiness May RX Tramadol 50 mg one tid prn pain #15 Please pend and I will sign

## 2021-09-02 LAB — METHYLMALONIC ACID, SERUM: Methylmalonic Acid, Quantitative: 175 nmol/L (ref 0–378)

## 2021-09-02 NOTE — Progress Notes (Signed)
Virtual Visit via Telephone Note [facility]  I connected with Carol Massey  on 09/03/21 at  11:12 AM by telephone and verified that I am speaking with the correct person using two identifiers.  The majority of this phone visit is conducted with her son, Carol Massey, due to the patient's underlying difficulty hearing.    Location: Patient: Home Provider: [facility]   I discussed the limitations, risks, security and privacy concerns of performing an evaluation and management service by telephone and the availability of in person appointments. I also discussed with the patient that there may be a patient responsible charge related to this service. The patient expressed understanding and agreed to proceed.   HISTORY OF PRESENT ILLNESS: Ms. Mas is followed by our clinic for iron deficiency anemia, secondary to multiple gastrointestinal AVMs and chronic GI blood loss.  She was last evaluated via telephone visit with Tarri Abernethy PA-C on 05/22/2021.  Today's visit is conducted over the phone, with history obtained from the patient's son, Carol Massey, who is present with his mother on this telephone call.  The patient has been doing somewhat poorly lately, per her son.  She had an ED visit on 08/24/2021 due to hypertensive emergency.  Blood pressure is being managed by her PCP.  She receives home health care and palliative care services.  Regarding her iron deficiency anemia, she has not had any melena or hematochezia.  Her son reports that she has intermittent episodes of darkly colored vomit every 1 to 2 months.  No frank blood in emesis.  Patient has been more fatigued and lethargic than usual, but is still able to transfer from wheelchair to bed and walk 20 to 60 feet with assistance.  She has been having headaches.  No chest pain or syncopal episodes.  Her dyspnea and cough are at baseline given her advanced COPD; no acute change in breathing status.  No B symptoms such as fever,  chills, or night sweats.  Energy is reported at 40%, with 100% appetite.  She is reported to be maintaining a stable weight at this time.    OBSERVATIONS/OBJECTIVE: Review of Systems  Constitutional:  Positive for malaise/fatigue. Negative for chills, diaphoresis, fever and weight loss.  Respiratory:  Positive for cough and shortness of breath.   Cardiovascular:  Negative for chest pain and palpitations.  Gastrointestinal:  Positive for constipation and vomiting. Negative for abdominal pain, blood in stool, melena and nausea.  Neurological:  Positive for dizziness and headaches.    PHYSICAL EXAM (per limitations of virtual telephone visit): The patient is alert and oriented x 3, exhibiting adequate mentation, good mood, and ability to speak in full sentences and execute sound judgement.   ASSESSMENT & PLAN: 1.  Iron deficiency anemia: - Etiology is chronic GI blood loss from multiple gastrointestinal AVMs - She is unable to tolerate oral iron tablets due to severe constipation. - Last Feraheme infusion on 04/04/2020 - No bright red blood per rectum or melena.  Occasional episodes of dark-colored vomit, but without any frank blood in emesis. - Symptomatic with worsening fatigue, lethargy. - Most recent labs (08/30/2021): Normal Hgb 12.3/MCV 101.1, ferritin 24, iron saturation 24%.  No apparent B12 or folate deficiency. - PLAN: Recommend IV Feraheme x2 in the setting of low ferritin and fatigue. - Repeat labs and phone visit in 3 months. - Advised patient son to seek immediate medical attention if she experiences any episodes of coffee-ground emesis, frank blood in vomit, rectal  bleeding, or melena.   FOLLOW UP INSTRUCTIONS: Feraheme x2 Labs and phone visit in 3 months    I discussed the assessment and treatment plan with the patient. The patient was provided an opportunity to ask questions and all were answered. The patient agreed with the plan and demonstrated an understanding of the  instructions.   The patient was advised to call back or seek an in-person evaluation if the symptoms worsen or if the condition fails to improve as anticipated.  I provided 18 minutes of non-face-to-face time during this encounter.   Harriett Rush, PA-C 09/03/21 12:52 PM

## 2021-09-03 ENCOUNTER — Inpatient Hospital Stay (HOSPITAL_BASED_OUTPATIENT_CLINIC_OR_DEPARTMENT_OTHER): Payer: Medicare Other | Admitting: Physician Assistant

## 2021-09-03 ENCOUNTER — Other Ambulatory Visit: Payer: Self-pay

## 2021-09-03 DIAGNOSIS — D500 Iron deficiency anemia secondary to blood loss (chronic): Secondary | ICD-10-CM | POA: Diagnosis not present

## 2021-09-03 DIAGNOSIS — E538 Deficiency of other specified B group vitamins: Secondary | ICD-10-CM

## 2021-09-04 ENCOUNTER — Telehealth: Payer: Self-pay | Admitting: Family Medicine

## 2021-09-04 ENCOUNTER — Other Ambulatory Visit: Payer: Self-pay | Admitting: Family Medicine

## 2021-09-04 NOTE — Telephone Encounter (Signed)
[facility]- needing verbal orders for speech therapy  for patient for 1 week four. [phone]

## 2021-09-04 NOTE — Telephone Encounter (Signed)
Verbal orders for speech therapy given to [place] at [facility]

## 2021-09-04 NOTE — Telephone Encounter (Signed)
Please give verbal orders thank you

## 2021-09-06 ENCOUNTER — Inpatient Hospital Stay (HOSPITAL_COMMUNITY): Payer: Medicare Other

## 2021-09-06 ENCOUNTER — Other Ambulatory Visit (HOSPITAL_COMMUNITY): Payer: Self-pay

## 2021-09-06 ENCOUNTER — Other Ambulatory Visit: Payer: Self-pay

## 2021-09-06 VITALS — BP 131/75 | HR 78 | Temp 98.9°F | Resp 18

## 2021-09-06 DIAGNOSIS — D500 Iron deficiency anemia secondary to blood loss (chronic): Secondary | ICD-10-CM

## 2021-09-06 DIAGNOSIS — Q2733 Arteriovenous malformation of digestive system vessel: Secondary | ICD-10-CM | POA: Diagnosis not present

## 2021-09-06 DIAGNOSIS — Z23 Encounter for immunization: Secondary | ICD-10-CM

## 2021-09-06 MED ORDER — SODIUM CHLORIDE 0.9 % IV SOLN: Freq: Once | INTRAVENOUS | Status: AC

## 2021-09-06 MED ORDER — INFLUENZA VAC A&B SA ADJ QUAD 0.5 ML IM PRSY
0.5000 mL | PREFILLED_SYRINGE | Freq: Once | INTRAMUSCULAR | Status: AC
  Administered 2021-09-06: 0.5 mL via INTRAMUSCULAR
  Filled 2021-09-06: qty 0.5

## 2021-09-06 MED ORDER — LORATADINE 10 MG PO TABS
10.0000 mg | ORAL_TABLET | Freq: Once | ORAL | Status: AC
  Administered 2021-09-06: 10 mg via ORAL
  Filled 2021-09-06: qty 1

## 2021-09-06 MED ORDER — SODIUM CHLORIDE 0.9 % IV SOLN
510.0000 mg | Freq: Once | INTRAVENOUS | Status: AC
  Administered 2021-09-06: 510 mg via INTRAVENOUS
  Filled 2021-09-06: qty 510

## 2021-09-06 NOTE — Patient Instructions (Signed)
[facility]  Discharge Instructions: Thank you for choosing [facility] to provide your oncology and hematology care.  If you have a lab appointment with the [facility], please come in thru the Main Entrance and check in at the main information desk.  Wear comfortable clothing and clothing appropriate for easy access to any Portacath or PICC line.   We strive to give you quality time with your provider. You may need to reschedule your appointment if you arrive late (15 or more minutes).  Arriving late affects you and other patients whose appointments are after yours.  Also, if you miss three or more appointments without notifying the office, you may be dismissed from the clinic at the providers discretion.      For prescription refill requests, have your pharmacy contact our office and allow 72 hours for refills to be completed.    Today you received the following Feraheme and influenza vaccination, return as scheduled.    To help prevent nausea and vomiting after your treatment, we encourage you to take your nausea medication as directed.  BELOW ARE SYMPTOMS THAT SHOULD BE REPORTED IMMEDIATELY: *FEVER GREATER THAN 100.4 F (38 C) OR HIGHER *CHILLS OR SWEATING *NAUSEA AND VOMITING THAT IS NOT CONTROLLED WITH YOUR NAUSEA MEDICATION *UNUSUAL SHORTNESS OF BREATH *UNUSUAL BRUISING OR BLEEDING *URINARY PROBLEMS (pain or burning when urinating, or frequent urination) *BOWEL PROBLEMS (unusual diarrhea, constipation, pain near the anus) TENDERNESS IN MOUTH AND THROAT WITH OR WITHOUT PRESENCE OF ULCERS (sore throat, sores in mouth, or a toothache) UNUSUAL RASH, SWELLING OR PAIN  UNUSUAL VAGINAL DISCHARGE OR ITCHING   Items with * indicate a potential emergency and should be followed up as soon as possible or go to the Emergency Department if any problems should occur.  Please show the CHEMOTHERAPY ALERT CARD or IMMUNOTHERAPY ALERT CARD at check-in to the Emergency  Department and triage nurse.  Should you have questions after your visit or need to cancel or reschedule your appointment, please contact [facility] [phone]  and follow the prompts.  Office hours are 8:00 a.m. to 4:30 p.m. Monday - Friday. Please note that voicemails left after 4:00 p.m. may not be returned until the following business day.  We are closed weekends and major holidays. You have access to a nurse at all times for urgent questions. Please call the main number to the clinic [phone] and follow the prompts.  For any non-urgent questions, you may also contact your provider using MyChart. We now offer e-Visits for anyone 14 and older to request care online for non-urgent symptoms. For details visit [email or web address].   Also download the MyChart app! Go to the app store, search "MyChart", open the app, select [facility], and log in with your MyChart username and password.  Due to Covid, a mask is required upon entering the hospital/clinic. If you do not have a mask, one will be given to you upon arrival. For doctor visits, patients may have 1 support person aged 38 or older with them. For treatment visits, patients cannot have anyone with them due to current Covid guidelines and our immunocompromised population.

## 2021-09-06 NOTE — Progress Notes (Signed)
Patient presents today for iron infusion. Patient reports taking tylenol around lunch time today, pharmacy aware. Patient tolerated iron infusion with no complaints voiced. Peripheral IV site clean and dry with good blood return noted before and after infusion. Band aid applied.  Patient requested influenza vaccination. Patient tolerated injection with no complaints voiced. Site clean and dry with no bruising or swelling noted at site. See MAR for details. Band aid applied.  Patient stable during and after injection. VSS with discharge and left in satisfactory condition with no s/s of distress noted.

## 2021-09-07 ENCOUNTER — Ambulatory Visit (HOSPITAL_COMMUNITY): Payer: Medicare Other

## 2021-09-10 ENCOUNTER — Ambulatory Visit: Payer: Medicare Other | Admitting: Family Medicine

## 2021-09-10 ENCOUNTER — Other Ambulatory Visit: Payer: Self-pay

## 2021-09-10 DIAGNOSIS — F411 Generalized anxiety disorder: Secondary | ICD-10-CM | POA: Diagnosis not present

## 2021-09-10 DIAGNOSIS — M19041 Primary osteoarthritis, right hand: Secondary | ICD-10-CM

## 2021-09-10 DIAGNOSIS — I10 Essential (primary) hypertension: Secondary | ICD-10-CM | POA: Diagnosis not present

## 2021-09-10 DIAGNOSIS — E876 Hypokalemia: Secondary | ICD-10-CM

## 2021-09-10 DIAGNOSIS — D500 Iron deficiency anemia secondary to blood loss (chronic): Secondary | ICD-10-CM

## 2021-09-10 DIAGNOSIS — M542 Cervicalgia: Secondary | ICD-10-CM

## 2021-09-10 DIAGNOSIS — J431 Panlobular emphysema: Secondary | ICD-10-CM

## 2021-09-10 DIAGNOSIS — M19042 Primary osteoarthritis, left hand: Secondary | ICD-10-CM

## 2021-09-10 MED ORDER — SPIRIVA HANDIHALER 18 MCG IN CAPS: ORAL_CAPSULE | RESPIRATORY_TRACT | 1 refills | Status: DC

## 2021-09-10 MED ORDER — PANTOPRAZOLE SODIUM 40 MG PO TBEC: DELAYED_RELEASE_TABLET | ORAL | 1 refills | Status: DC

## 2021-09-10 MED ORDER — AMLODIPINE BESYLATE 2.5 MG PO TABS: 2.5000 mg | ORAL_TABLET | Freq: Every day | ORAL | 1 refills | Status: DC

## 2021-09-10 MED ORDER — ALPRAZOLAM 0.25 MG PO TABS: ORAL_TABLET | ORAL | 4 refills | Status: DC

## 2021-09-10 MED ORDER — CITALOPRAM HYDROBROMIDE 10 MG PO TABS: 10.0000 mg | ORAL_TABLET | Freq: Every day | ORAL | 1 refills | Status: DC

## 2021-09-10 NOTE — Progress Notes (Addendum)
Subjective:    Patient ID: Carol Massey, female    DOB: [birth year], 88 y.o.   MRN: [id number]  HPI ER follow up for elevated blood pressure urgency and confusion  Headaches have been going on , some episodes of confusion , taking tramadol about 2 daily Could not take meclizine due to it causing her to have dark saliva and possible swollen tongue, Had some spikes over past weeks of 188/89 but decreased to normal ranges w/I 20 mins per son patient is also receiving palliative visits every two weeks as well as a speech therapy nurse is visiting Currently the patient is bedbound.  Unable to make it to the office.  Having need for follow-up from ER for blood pressure urgency and confusion.  It would be incredibly hard on her to have her take EMS to come to the office just for the sake of that.  Therefore home visit was absolutely necessary. Review of Systems     Objective:   Physical Exam Lungs are clear heart regular pulse normal patient very weak-significant muscle wasting.  Abdomen soft subjective tenderness son is doing a great job taking care of her.       Assessment & Plan:  1. Essential hypertension Blood pressure good control continue current measures - amLODipine (NORVASC) 2.5 MG tablet; Take 1 tablet (2.5 mg total) by mouth daily.  Dispense: 90 tablet; Refill: 1 - citalopram (CELEXA) 10 MG tablet; Take 1 tablet (10 mg total) by mouth daily.  Dispense: 90 tablet; Refill: 1 - pantoprazole (PROTONIX) 40 MG tablet; TAKE (1) TABLET BY MOUTH TWICE DAILY.  Dispense: 180 tablet; Refill: 1 - tiotropium (SPIRIVA HANDIHALER) 18 MCG inhalation capsule; INHALE 1 PUFF INTO THE LUNGS DAILY  Dispense: 90 capsule; Refill: 1  2. Panlobular emphysema ([place]) Emphysema in stage palliative care continue current measures - amLODipine (NORVASC) 2.5 MG tablet; Take 1 tablet (2.5 mg total) by mouth daily.  Dispense: 90 tablet; Refill: 1 - citalopram (CELEXA) 10 MG tablet; Take 1 tablet (10 mg total) by  mouth daily.  Dispense: 90 tablet; Refill: 1 - pantoprazole (PROTONIX) 40 MG tablet; TAKE (1) TABLET BY MOUTH TWICE DAILY.  Dispense: 180 tablet; Refill: 1 - tiotropium (SPIRIVA HANDIHALER) 18 MCG inhalation capsule; INHALE 1 PUFF INTO THE LUNGS DAILY  Dispense: 90 capsule; Refill: 1  3. Generalized anxiety disorder Xanax half tablet 3 times daily as needed  4. Primary osteoarthritis of both hands Tylenol as needed  5. Cervical pain (neck) Tylenol but when that does not help tramadol  6. Iron deficiency anemia due to chronic blood loss Follow-up with specialist periodically fingerstick hemoglobin every 3 to 4 weeks here - amLODipine (NORVASC) 2.5 MG tablet; Take 1 tablet (2.5 mg total) by mouth daily.  Dispense: 90 tablet; Refill: 1 - citalopram (CELEXA) 10 MG tablet; Take 1 tablet (10 mg total) by mouth daily.  Dispense: 90 tablet; Refill: 1 - pantoprazole (PROTONIX) 40 MG tablet; TAKE (1) TABLET BY MOUTH TWICE DAILY.  Dispense: 180 tablet; Refill: 1 - tiotropium (SPIRIVA HANDIHALER) 18 MCG inhalation capsule; INHALE 1 PUFF INTO THE LUNGS DAILY  Dispense: 90 capsule; Refill: 1  7. Hypokalemia Previously was doing well - amLODipine (NORVASC) 2.5 MG tablet; Take 1 tablet (2.5 mg total) by mouth daily.  Dispense: 90 tablet; Refill: 1 - citalopram (CELEXA) 10 MG tablet; Take 1 tablet (10 mg total) by mouth daily.  Dispense: 90 tablet; Refill: 1 - pantoprazole (PROTONIX) 40 MG tablet; TAKE (1) TABLET BY MOUTH TWICE DAILY.  Dispense: 180 tablet; Refill: 1 - tiotropium (SPIRIVA HANDIHALER) 18 MCG inhalation capsule; INHALE 1 PUFF INTO THE LUNGS DAILY  Dispense: 90 capsule; Refill: 1  8. Panlobular emphysema ([place]) See above - amLODipine (NORVASC) 2.5 MG tablet; Take 1 tablet (2.5 mg total) by mouth daily.  Dispense: 90 tablet; Refill: 1 - citalopram (CELEXA) 10 MG tablet; Take 1 tablet (10 mg total) by mouth daily.  Dispense: 90 tablet; Refill: 1 - pantoprazole (PROTONIX) 40 MG tablet; TAKE  (1) TABLET BY MOUTH TWICE DAILY.  Dispense: 180 tablet; Refill: 1 - tiotropium (SPIRIVA HANDIHALER) 18 MCG inhalation capsule; INHALE 1 PUFF INTO THE LUNGS DAILY  Dispense: 90 capsule; Refill: 1  Patient is palliative care has had some swallowing difficulty speech therapy working with her Has also had some spells of blood pressure going up but currently doing well I would not recommend adding blood pressure medicines on top of what she is already taking

## 2021-09-11 ENCOUNTER — Telehealth: Payer: Self-pay | Admitting: Family Medicine

## 2021-09-11 NOTE — Telephone Encounter (Signed)
Patient had home visit yesterday and forgot to ask for refill on tramadol 50 mg and zofran called into [facility]

## 2021-09-12 ENCOUNTER — Telehealth: Payer: Medicare Other

## 2021-09-12 ENCOUNTER — Other Ambulatory Visit: Payer: Self-pay | Admitting: Family Medicine

## 2021-09-12 MED ORDER — ONDANSETRON 4 MG PO TBDP: 4.0000 mg | ORAL_TABLET | Freq: Three times a day (TID) | ORAL | 2 refills | Status: AC | PRN

## 2021-09-12 MED ORDER — TRAMADOL HCL 50 MG PO TABS: ORAL_TABLET | ORAL | 0 refills | Status: DC

## 2021-09-12 NOTE — Telephone Encounter (Signed)
Refills was sent in

## 2021-09-12 NOTE — Telephone Encounter (Signed)
Son (DPR) notified and verbalized understanding. 

## 2021-09-14 ENCOUNTER — Telehealth: Payer: Self-pay | Admitting: Family Medicine

## 2021-09-14 ENCOUNTER — Ambulatory Visit (HOSPITAL_COMMUNITY): Payer: Medicare Other

## 2021-09-14 NOTE — Telephone Encounter (Signed)
Please cancel the visit on January 24 Lets set her up for home visit mid March

## 2021-09-14 NOTE — Telephone Encounter (Signed)
Shalyn with [facility] called stating patient is having issues swallowing and wanting you to check it out at her visit on 1/24. Orbie Pyo states needs to be address due to the fact she is doing speech therapy with them. Please advise

## 2021-09-14 NOTE — Telephone Encounter (Signed)
Pt son called and said pt had a home visit with Dr Nicki Reaper on 09/10/21. They had one scheduled for 09/18/21. He was under the impression this was to be cancelled. He wanted to confirm.   [phone]

## 2021-09-14 NOTE — Telephone Encounter (Signed)
Her number is [phone] Carol Massey with [facility]

## 2021-09-14 NOTE — Telephone Encounter (Signed)
I did go see the patient this week This patient has end-stage COPD Cachexia Son stated that she seems to be eating well as best as she could for her given condition  please talk with them to find out what type of issues are having with swallowing

## 2021-09-14 NOTE — Telephone Encounter (Signed)
Spoke with [facility] ph # [phone] and verified she works on referrals and wanted to make sure pt had a recent face to face appt to address swallowing. There were no additional concerns reported. She requested notes to be faxed to [phone].

## 2021-09-17 ENCOUNTER — Inpatient Hospital Stay (HOSPITAL_COMMUNITY): Payer: Medicare Other

## 2021-09-17 ENCOUNTER — Other Ambulatory Visit: Payer: Self-pay

## 2021-09-17 VITALS — BP 120/78 | HR 68 | Temp 96.7°F | Resp 16

## 2021-09-17 DIAGNOSIS — Q2733 Arteriovenous malformation of digestive system vessel: Secondary | ICD-10-CM | POA: Diagnosis not present

## 2021-09-17 DIAGNOSIS — D500 Iron deficiency anemia secondary to blood loss (chronic): Secondary | ICD-10-CM

## 2021-09-17 MED ORDER — SODIUM CHLORIDE 0.9 % IV SOLN: Freq: Once | INTRAVENOUS | Status: AC

## 2021-09-17 MED ORDER — SODIUM CHLORIDE 0.9 % IV SOLN
510.0000 mg | Freq: Once | INTRAVENOUS | Status: AC
  Administered 2021-09-17: 510 mg via INTRAVENOUS
  Filled 2021-09-17: qty 510

## 2021-09-17 NOTE — Progress Notes (Signed)
Took premeds at home.

## 2021-09-17 NOTE — Progress Notes (Signed)
Patient presents today for iron infusion.  Patient is in satisfactory condition with no new complaints voiced.  Vital signs are stable.  We will proceed with treatment per MD orders.  Patient tolerated treatment well with no complaints voiced.  Son refused the full 30 minute wait time.  Patient did wait 15 minutes post infusion.  Patient left via wheelchair in stable condition.  Vital signs stable at discharge.  Follow up as scheduled.

## 2021-09-17 NOTE — Patient Instructions (Signed)
[facility]  Discharge Instructions: Thank you for choosing [facility] to provide your oncology and hematology care.  If you have a lab appointment with the Cancer Center, please come in thru the Main Entrance and check in at the main information desk.  Wear comfortable clothing and clothing appropriate for easy access to any Portacath or PICC line.   We strive to give you quality time with your provider. You may need to reschedule your appointment if you arrive late (15 or more minutes).  Arriving late affects you and other patients whose appointments are after yours.  Also, if you miss three or more appointments without notifying the office, you may be dismissed from the clinic at the provider's discretion.      For prescription refill requests, have your pharmacy contact our office and allow 72 hours for refills to be completed.        To help prevent nausea and vomiting after your treatment, we encourage you to take your nausea medication as directed.  BELOW ARE SYMPTOMS THAT SHOULD BE REPORTED IMMEDIATELY: *FEVER GREATER THAN 100.4 F (38 C) OR HIGHER *CHILLS OR SWEATING *NAUSEA AND VOMITING THAT IS NOT CONTROLLED WITH YOUR NAUSEA MEDICATION *UNUSUAL SHORTNESS OF BREATH *UNUSUAL BRUISING OR BLEEDING *URINARY PROBLEMS (pain or burning when urinating, or frequent urination) *BOWEL PROBLEMS (unusual diarrhea, constipation, pain near the anus) TENDERNESS IN MOUTH AND THROAT WITH OR WITHOUT PRESENCE OF ULCERS (sore throat, sores in mouth, or a toothache) UNUSUAL RASH, SWELLING OR PAIN  UNUSUAL VAGINAL DISCHARGE OR ITCHING   Items with * indicate a potential emergency and should be followed up as soon as possible or go to the Emergency Department if any problems should occur.  Please show the CHEMOTHERAPY ALERT CARD or IMMUNOTHERAPY ALERT CARD at check-in to the Emergency Department and triage nurse.  Should you have questions after your visit or need to cancel  or reschedule your appointment, please contact [facility] [phone]  and follow the prompts.  Office hours are 8:00 a.m. to 4:30 p.m. Monday - Friday. Please note that voicemails left after 4:00 p.m. may not be returned until the following business day.  We are closed weekends and major holidays. You have access to a nurse at all times for urgent questions. Please call the main number to the clinic [phone] and follow the prompts.  For any non-urgent questions, you may also contact your provider using MyChart. We now offer e-Visits for anyone 18 and older to request care online for non-urgent symptoms. For details visit [email or web address].   Also download the MyChart app! Go to the app store, search "MyChart", open the app, select [facility], and log in with your MyChart username and password.  Due to Covid, a mask is required upon entering the hospital/clinic. If you do not have a mask, one will be given to you upon arrival. For doctor visits, patients may have 1 support person aged 18 or older with them. For treatment visits, patients cannot have anyone with them due to current Covid guidelines and our immunocompromised population.  

## 2021-09-18 ENCOUNTER — Ambulatory Visit: Payer: Medicare Other | Admitting: Family Medicine

## 2021-09-18 ENCOUNTER — Telehealth: Payer: Self-pay | Admitting: *Deleted

## 2021-09-18 ENCOUNTER — Other Ambulatory Visit: Payer: Self-pay | Admitting: Family Medicine

## 2021-09-18 MED ORDER — TRAMADOL HCL 50 MG PO TABS: ORAL_TABLET | ORAL | 2 refills | Status: DC

## 2021-09-18 NOTE — Telephone Encounter (Signed)
Patient's son informed that prescription was sent in. Verbalized understanding

## 2021-09-18 NOTE — Telephone Encounter (Signed)
Done - thank you.

## 2021-09-25 ENCOUNTER — Telehealth: Payer: Self-pay | Admitting: Family Medicine

## 2021-09-25 NOTE — Telephone Encounter (Signed)
[facility] calling for verbal orders for speech therapy for one week /five days. Barnett Applebaum [phone]

## 2021-09-25 NOTE — Telephone Encounter (Signed)
Verbal order given to [facility]

## 2021-09-25 NOTE — Telephone Encounter (Signed)
May have verbal orders for this thank you

## 2021-09-26 ENCOUNTER — Other Ambulatory Visit: Payer: Self-pay | Admitting: Family Medicine

## 2021-09-26 DIAGNOSIS — D500 Iron deficiency anemia secondary to blood loss (chronic): Secondary | ICD-10-CM

## 2021-09-26 DIAGNOSIS — I10 Essential (primary) hypertension: Secondary | ICD-10-CM

## 2021-09-26 DIAGNOSIS — J431 Panlobular emphysema: Secondary | ICD-10-CM

## 2021-09-26 DIAGNOSIS — E876 Hypokalemia: Secondary | ICD-10-CM

## 2021-10-02 ENCOUNTER — Other Ambulatory Visit: Payer: Medicare Other

## 2021-10-02 ENCOUNTER — Other Ambulatory Visit: Payer: Self-pay | Admitting: Family Medicine

## 2021-10-03 ENCOUNTER — Other Ambulatory Visit: Payer: Self-pay

## 2021-10-03 ENCOUNTER — Other Ambulatory Visit (INDEPENDENT_AMBULATORY_CARE_PROVIDER_SITE_OTHER): Payer: Medicare Other | Admitting: *Deleted

## 2021-10-03 DIAGNOSIS — D500 Iron deficiency anemia secondary to blood loss (chronic): Secondary | ICD-10-CM

## 2021-10-03 LAB — POCT HEMOGLOBIN: Hemoglobin: 13.9 g/dL (ref 11–14.6)

## 2021-10-03 NOTE — Progress Notes (Signed)
Informed son [name] per Dr. Wolfgang Phoenix to follow up in 4-6 weeks.

## 2021-10-08 ENCOUNTER — Other Ambulatory Visit: Payer: Self-pay | Admitting: Family Medicine

## 2021-10-17 ENCOUNTER — Ambulatory Visit (INDEPENDENT_AMBULATORY_CARE_PROVIDER_SITE_OTHER): Payer: Medicare Other | Admitting: *Deleted

## 2021-10-17 DIAGNOSIS — I10 Essential (primary) hypertension: Secondary | ICD-10-CM

## 2021-10-17 DIAGNOSIS — J431 Panlobular emphysema: Secondary | ICD-10-CM

## 2021-10-17 NOTE — Chronic Care Management (AMB) (Signed)
Chronic Care Management   CCM RN Visit Note  10/17/2021 Name: Carol Massey MRN: [id number] DOB: Apr 18, 1926  Subjective: Carol Massey is a 88 y.o. year old female who is a primary care patient of Luking, Elayne Snare, MD. The care management team was consulted for assistance with disease management and care coordination needs.    Engaged with patient by telephone for follow up visit in response to provider referral for case management and/or care coordination services.   Consent to Services:  The patient was given information about Chronic Care Management services, agreed to services, and gave verbal consent prior to initiation of services.  Please see initial visit note for detailed documentation.   Patient agreed to services and verbal consent obtained.   Assessment: Review of patient past medical history, allergies, medications, health status, including review of consultants reports, laboratory and other test data, was performed as part of comprehensive evaluation and provision of chronic care management services.   SDOH (Social Determinants of Health) assessments and interventions performed:    CCM Care Plan  No Known Allergies  Outpatient Encounter Medications as of 10/17/2021  Medication Sig   Acetaminophen (TYLENOL EXTRA STRENGTH PO) Take 500 mg by mouth in the morning, at noon, and at bedtime.   albuterol (VENTOLIN HFA) 108 (90 Base) MCG/ACT inhaler INHALE 2 PUFFS INTO THE LUNGS EVERY 4 HOURS AS NEEDED FOR WHEEZING OR SHORTNESS OF BREATH.   ALPRAZolam (XANAX) 0.25 MG tablet 1/2 tablet taken 3 times daily as needed for anxiety   amLODipine (NORVASC) 2.5 MG tablet Take 1 tablet (2.5 mg total) by mouth daily.   Ascorbic Acid (VITAMIN C) 1000 MG tablet Take 1,000 mg by mouth daily.   Calcium Carbonate Antacid (TUMS PO) Take 1 tablet by mouth every 6 (six) hours as needed. 3 -4 a day   citalopram (CELEXA) 10 MG tablet Take 1 tablet (10 mg total) by mouth daily.   Cyanocobalamin (VITAMIN B  12 PO) Take 1,000 mcg by mouth every other day.    Multiple Vitamins-Minerals (EMERGEN-C IMMUNE PO) Take 1 Package by mouth daily.   ondansetron (ZOFRAN ODT) 4 MG disintegrating tablet Take 1 tablet (4 mg total) by mouth every 8 (eight) hours as needed for nausea or vomiting.   OVER THE COUNTER MEDICATION Vit C one daily One a day multi vitamin Calcium 643m + D  Vit b12 1,000 mcg. One every other day   OVER THE COUNTER MEDICATION Take 1 tablet by mouth daily. Multivitamin qd   OVER THE COUNTER MEDICATION Take 1 tablet by mouth daily. Calcium 6047mplus Vit D3 2046mqd   pantoprazole (PROTONIX) 40 MG tablet TAKE (1) TABLET BY MOUTH TWICE DAILY.   potassium chloride 20 MEQ/15ML (10%) SOLN DILUTE 15 ML IN 8 OUNCES OF WATER OR JUICE AND TAKE ONCE DAILY.   sucralfate (CARAFATE) 1 g tablet TAKE 1 TABLET BY MOUTH 3 TIMES DAILY AS NEEDED. MAY BREAK UP AND MIX IN WATER.   SYMBICORT 80-4.5 MCG/ACT inhaler INHALE 2 PUFFS INTO THE LUNGS TWICE DAILY.   tiotropium (SPIRIVA HANDIHALER) 18 MCG inhalation capsule INHALE 1 PUFF INTO THE LUNGS DAILY   traMADol (ULTRAM) 50 MG tablet TAKE (1) TABLET BY MOUTH (3) TIMES A DAY AS NEEDED FOR PAIN.   Facility-Administered Encounter Medications as of 10/17/2021  Medication   sodium chloride flush (NS) 0.9 % injection 10 mL    Patient Active Problem List   Diagnosis Date Noted   LUQ abdominal pain 09/22/2019   Nausea without  vomiting 09/22/2019   Belching 09/22/2019   Constipation 05/18/2019   Abdominal pain 05/18/2019   Vomiting 05/18/2019   Intertrochanteric fracture of right hip ([place]) 12/28/2018   Closed right hip fracture, initial encounter ([place]) 12/27/2018   Anemia, chronic disease 01/21/2018   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   Essential hypertension 12/12/2016   History of peptic ulcer disease    Ataxia 12/01/2015   PUD (peptic ulcer disease)    Melena 11/27/2015   Upper GI bleed 11/27/2015   Tobacco abuse 11/27/2015   Azotemia 11/27/2015    Right-sided chest pain 11/27/2015   Dysphagia    Generalized anxiety disorder 07/24/2015   Anemia due to GI blood loss 05/27/2014   Hyperlipemia 03/18/2013   COPD (chronic obstructive pulmonary disease) ([place]) 01/12/2013   Osteoporosis 01/12/2013    Conditions to be addressed/monitored:HTN and COPD  Care Plan : RN Care Manager plan of care  Updates made by Kassie Mends, RN since 10/17/2021 12:00 AM     Problem: No plan of care established for management of chronic disease states (HTN, COPD)   Priority: High     Long-Range Goal: Development of plan of care for chronic disease management (HTN, COPD)   Priority: High  Note:   Current Barriers:  Knowledge Deficits related to plan of care for management of HTN and COPD  No Advanced Directives in place - per son, received advanced directives packet, has not completed  Knowledge Deficits related to basic understanding of hypertension pathophysiology and self care management- patient lives with adult son who is primary caregiver, pt has CNA from [facility] that sees her M-F 11am-3pm and son pays out of pocket for one hour on Saturday to assist with bathing patient.  Pt is able to transfer from wheelchair to bed, to commode etc and can walk short distances with assistance of another person, wheelchair and gait belt.  Pt had continues to have issues with swallowing which has now improved somewhat and speech therapy (home health) has been working with patient and continues once weekly.  Son reports CNA monitors patient's blood pressure as well as he does (but this is not daily), visiting RN from palliative care (once month) completes assessment and [place] calls monthly also.  Patient has had 2 IV Feraheme treatments for anemia and son reports "hemoglobin is back up to 13.9" Knowledge deficits related to basic understanding of COPD disease process- patient/ caregiver needs teaching/ reinforcement COPD action plan Unable to perform ADLs  independently- son provides oversight with all aspects of patient's care including medications Unable to perform IADLs independently- son assists  RNCM Clinical Goal(s):  Patient will verbalize understanding of plan for management of HTN and COPD as evidenced by patient/ son report, review of EHR and  through collaboration with RN Care manager, provider, and care team.  Interventions: 1:1 collaboration with primary care provider regarding development and update of comprehensive plan of care as evidenced by provider attestation and co-signature Inter-disciplinary care team collaboration (see longitudinal plan of care) Evaluation of current treatment plan related to  self management and patient's adherence to plan as established by provider  Hypertension Interventions: Last practice recorded BP readings:  BP Readings from Last 3 Encounters:  02/28/21 134/70  11/10/20 120/64  08/30/20 110/70  Most recent eGFR/CrCl: No results found for: EGFR  No components found for: CRCL  Evaluation of current treatment plan related to hypertension self management and patient's adherence to plan as established by provider Reviewed medications with patient and discussed  importance of compliance Reviewed low sodium diet Reinforced son to check patient's blood pressure at least 3 x per week and record Reviewed importance of including fiber in diet to help prevent constipation  COPD Interventions:   Advised patient to self assesses COPD action plan zone and make appointment with provider if in the yellow zone for 48 hours without improvement Provided education about and advised patient to utilize infection prevention strategies to reduce risk of respiratory infection Discussed the importance of adequate rest and management of fatigue with COPD Reinforced good handwashing practices, wearing a mask as needed, avoiding sick people  Pain assessment completed  Patient Goals/Self-Care Activities: Take  medications as prescribed   Attend all scheduled provider appointments Call provider office for new concerns or questions  identify and remove indoor air pollutants follow rescue plan if symptoms flare-up get at least 7 to 8 hours of sleep at night do breathing exercises at least 2 times each day check blood pressure 3 times per week write blood pressure results in a log or diary take medications for blood pressure exactly as prescribed eat more whole grains, fruits and vegetables, lean meats and healthy fats Continue to follow low sodium diet- avoid fast food, salty snacks Eat adequate fiber into your diet  Follow safety precautions- keep pathways clear, use walker and wheelchair Follow aspiration precautions for swallowing Lab work due 11/07/21       Plan:Telephone follow up appointment with care management team member scheduled for:  12/12/21   Jacqlyn Larsen [facility], BSN RN Case Manager [place] [phone]

## 2021-10-17 NOTE — Patient Instructions (Signed)
Visit Information  Thank you for taking time to visit with me today. Please don't hesitate to contact me if I can be of assistance to you before our next scheduled telephone appointment.  Following are the goals we discussed today:  Take medications as prescribed   Attend all scheduled provider appointments Call provider office for new concerns or questions  identify and remove indoor air pollutants follow rescue plan if symptoms flare-up get at least 7 to 8 hours of sleep at night do breathing exercises at least 2 times each day check blood pressure 3 times per week write blood pressure results in a log or diary take medications for blood pressure exactly as prescribed eat more whole grains, fruits and vegetables, lean meats and healthy fats Continue to follow low sodium diet- avoid fast food, salty snacks Eat adequate fiber into your diet  Follow safety precautions- keep pathways clear, use walker and wheelchair Follow aspiration precautions for swallowing Lab work due 11/07/21  Our next appointment is by telephone on 12/12/21 at 945 am  Please call the care guide team at [phone] if you need to cancel or reschedule your appointment.   If you are experiencing a Mental Health or [place] or need someone to talk to, please call the [place] National Suicide Prevention Lifeline: 704-615-8617 or TTY: 670-454-1517 TTY 939-635-5554) to talk to a trained counselor call 1-800-273-TALK (toll free, 24 hour hotline) go to [facility] [address], [place] [phone]) call the [place]: [phone] call 911   The patient verbalized understanding of instructions, educational materials, and care plan provided today and declined offer to receive copy of patient instructions, educational materials, and care plan.   Jacqlyn Larsen [facility], BSN RN Case Manager [facility] [phone]

## 2021-10-23 DIAGNOSIS — I10 Essential (primary) hypertension: Secondary | ICD-10-CM

## 2021-10-23 DIAGNOSIS — J431 Panlobular emphysema: Secondary | ICD-10-CM | POA: Diagnosis not present

## 2021-10-25 ENCOUNTER — Other Ambulatory Visit: Payer: Self-pay | Admitting: Family Medicine

## 2021-11-07 ENCOUNTER — Other Ambulatory Visit (INDEPENDENT_AMBULATORY_CARE_PROVIDER_SITE_OTHER): Payer: Medicare Other

## 2021-11-07 ENCOUNTER — Other Ambulatory Visit: Payer: Self-pay

## 2021-11-07 DIAGNOSIS — D500 Iron deficiency anemia secondary to blood loss (chronic): Secondary | ICD-10-CM | POA: Diagnosis not present

## 2021-11-07 LAB — POCT HEMOGLOBIN: Hemoglobin: 12.9 g/dL (ref 11–14.6)

## 2021-11-07 NOTE — Progress Notes (Signed)
Patient's son [name] informed of hgb 12.9. Follow up in 6-8 weeks for repeat hemoglobin. ?

## 2021-11-09 DIAGNOSIS — J441 Chronic obstructive pulmonary disease with (acute) exacerbation: Secondary | ICD-10-CM | POA: Diagnosis not present

## 2021-11-09 DIAGNOSIS — J431 Panlobular emphysema: Secondary | ICD-10-CM

## 2021-11-09 DIAGNOSIS — M6281 Muscle weakness (generalized): Secondary | ICD-10-CM | POA: Diagnosis not present

## 2021-11-09 DIAGNOSIS — R1312 Dysphagia, oropharyngeal phase: Secondary | ICD-10-CM | POA: Diagnosis not present

## 2021-11-13 ENCOUNTER — Other Ambulatory Visit: Payer: Self-pay | Admitting: Family Medicine

## 2021-11-19 ENCOUNTER — Other Ambulatory Visit: Payer: Self-pay | Admitting: Family Medicine

## 2021-11-20 DIAGNOSIS — R1312 Dysphagia, oropharyngeal phase: Secondary | ICD-10-CM | POA: Diagnosis not present

## 2021-11-20 DIAGNOSIS — J431 Panlobular emphysema: Secondary | ICD-10-CM

## 2021-11-20 DIAGNOSIS — J441 Chronic obstructive pulmonary disease with (acute) exacerbation: Secondary | ICD-10-CM | POA: Diagnosis not present

## 2021-11-26 ENCOUNTER — Other Ambulatory Visit: Payer: Self-pay | Admitting: Family Medicine

## 2021-11-27 NOTE — Telephone Encounter (Signed)
Spoke with patient's son [name] and he says he is given pt 1/2 tablet in the morning around 10 am and 1 tablet at 4 pm daily.  He says the last prescription was filled on 11/13/21.  He has enough medication until the dose tomorrow morning. ?

## 2021-12-05 ENCOUNTER — Inpatient Hospital Stay (HOSPITAL_COMMUNITY): Payer: Medicare Other | Attending: Hematology

## 2021-12-05 DIAGNOSIS — D500 Iron deficiency anemia secondary to blood loss (chronic): Secondary | ICD-10-CM | POA: Diagnosis not present

## 2021-12-05 DIAGNOSIS — J449 Chronic obstructive pulmonary disease, unspecified: Secondary | ICD-10-CM | POA: Insufficient documentation

## 2021-12-05 DIAGNOSIS — K922 Gastrointestinal hemorrhage, unspecified: Secondary | ICD-10-CM | POA: Diagnosis not present

## 2021-12-05 DIAGNOSIS — R5383 Other fatigue: Secondary | ICD-10-CM | POA: Diagnosis not present

## 2021-12-05 DIAGNOSIS — R1110 Vomiting, unspecified: Secondary | ICD-10-CM | POA: Diagnosis not present

## 2021-12-05 DIAGNOSIS — R519 Headache, unspecified: Secondary | ICD-10-CM | POA: Insufficient documentation

## 2021-12-05 DIAGNOSIS — K5900 Constipation, unspecified: Secondary | ICD-10-CM | POA: Insufficient documentation

## 2021-12-05 DIAGNOSIS — Z79899 Other long term (current) drug therapy: Secondary | ICD-10-CM | POA: Diagnosis not present

## 2021-12-05 LAB — CBC WITH DIFFERENTIAL/PLATELET
Abs Immature Granulocytes: 0.01 10*3/uL (ref 0.00–0.07)
Basophils Absolute: 0.1 10*3/uL (ref 0.0–0.1)
Basophils Relative: 1 %
Eosinophils Absolute: 0.1 10*3/uL (ref 0.0–0.5)
Eosinophils Relative: 1 %
HCT: 36.1 % (ref 36.0–46.0)
Hemoglobin: 11.7 g/dL — ABNORMAL LOW (ref 12.0–15.0)
Immature Granulocytes: 0 %
Lymphocytes Relative: 22 %
Lymphs Abs: 1.1 10*3/uL (ref 0.7–4.0)
MCH: 33.1 pg (ref 26.0–34.0)
MCHC: 32.4 g/dL (ref 30.0–36.0)
MCV: 102.3 fL — ABNORMAL HIGH (ref 80.0–100.0)
Monocytes Absolute: 0.4 10*3/uL (ref 0.1–1.0)
Monocytes Relative: 9 %
Neutro Abs: 3.3 10*3/uL (ref 1.7–7.7)
Neutrophils Relative %: 67 %
Platelets: 206 10*3/uL (ref 150–400)
RBC: 3.53 MIL/uL — ABNORMAL LOW (ref 3.87–5.11)
RDW: 13.2 % (ref 11.5–15.5)
WBC: 5 10*3/uL (ref 4.0–10.5)
nRBC: 0 % (ref 0.0–0.2)

## 2021-12-05 LAB — IRON AND TIBC
Iron: 91 ug/dL (ref 28–170)
Saturation Ratios: 41 % — ABNORMAL HIGH (ref 10.4–31.8)
TIBC: 222 ug/dL — ABNORMAL LOW (ref 250–450)
UIBC: 131 ug/dL

## 2021-12-05 LAB — FERRITIN: Ferritin: 340 ng/mL — ABNORMAL HIGH (ref 11–307)

## 2021-12-06 NOTE — Progress Notes (Signed)
? ?Virtual Visit via Telephone Note ?[place] ? ?I connected with Carol Massey  on 12/07/21 at 12:19 PM by telephone and verified that I am speaking with the correct person using two identifiers. **With the patient's permission, the majority of this phone visit is conducted with her son, Carol Massey, due to the patient's underlying difficulty hearing.  Patient is also present during phone visit, but not highly participatory.   ? ?Location: ?Patient: Home ?Provider: [facility] ?  ?I discussed the limitations, risks, security and privacy concerns of performing an evaluation and management service by telephone and the availability of in person appointments. I also discussed with the patient that there may be a patient responsible charge related to this service. The patient expressed understanding and agreed to proceed. ? ? ?HISTORY OF PRESENT ILLNESS: ?Carol Massey is followed by our clinic for iron deficiency anemia, secondary to multiple gastrointestinal AVMs and chronic GI blood loss.  She was last evaluated via telephone visit with Tarri Abernethy PA-C on 09/03/2021.  Today's visit is conducted over the phone, with history obtained from the patient's son, Carol Massey, who is present with his mother on this telephone call. ?  ?Patient has been doing slightly better over the past months.  Her son attributes this to the IV iron she received and the fact that she has been receiving home health care visits from nurse once a week. ?She has not had any recent melena or hematochezia.  She has not had any recurrent episodes of darkly colored emesis.  No chest pain or syncopal episodes.  She has occasional headaches.  Her dyspnea and cough are at baseline given her advanced COPD; no acute change in breathing status.  No B symptoms such as fever, chills, or night sweats. ? ?Energy is reported at 75%, with 100% appetite.  She is reported to be maintaining a stable weight at this time. ? ?   ?OBSERVATIONS/OBJECTIVE: ?Review of Systems  ?Constitutional:  Positive for malaise/fatigue (improved). Negative for chills, diaphoresis, fever and weight loss.  ?Respiratory:  Negative for cough and shortness of breath.   ?Cardiovascular:  Negative for chest pain and palpitations.  ?Gastrointestinal:  Positive for constipation and nausea. Negative for abdominal pain, blood in stool, melena and vomiting.  ?Neurological:  Positive for dizziness (vertigo) and headaches.   ? ?PHYSICAL EXAM (per limitations of virtual telephone visit): The patient is alert and oriented x 3, exhibiting adequate mentation, good mood, and ability to speak in full sentences and execute sound judgement. ? ? ?ASSESSMENT & PLAN: ?1.  Iron deficiency anemia: ?- Etiology is chronic GI blood loss from multiple gastrointestinal AVMs ?- She is unable to tolerate oral iron tablets due to severe constipation. ?- Last Feraheme infusion x2 on 09/06/2021 and 09/17/2021 ?- No bright red blood per rectum or melena.  Occasional episodes of dark-colored vomit, but without any frank blood in emesis.   ?- Energy is improved after IV iron ?- Most recent labs (12/05/2021): Hgb 11.7/MCV 102.3, ferritin 340, iron saturation 41%.  No apparent B12 or folate deficiency when checked in January 2023. ?- PLAN: No need for IV iron at this time. ?- Repeat labs and phone visit in 4 months.   ?- Advised patient son to seek immediate medical attention if she experiences any episodes of coffee-ground emesis, frank blood in vomit, rectal bleeding, or melena.   ?  ?I discussed the assessment and treatment plan with the patient. The patient was provided an opportunity to ask  questions and all were answered. The patient agreed with the plan and demonstrated an understanding of the instructions. ?  ?The patient was advised to call back or seek an in-person evaluation if the symptoms worsen or if the condition fails to improve as anticipated. ? ?I provided 12 minutes of  non-face-to-face time during this encounter. ? ? ?Harriett Rush, PA-C ?12/07/2021 12:38 PM ?

## 2021-12-07 ENCOUNTER — Inpatient Hospital Stay (HOSPITAL_BASED_OUTPATIENT_CLINIC_OR_DEPARTMENT_OTHER): Payer: Medicare Other | Admitting: Physician Assistant

## 2021-12-07 DIAGNOSIS — D500 Iron deficiency anemia secondary to blood loss (chronic): Secondary | ICD-10-CM | POA: Diagnosis not present

## 2021-12-07 DIAGNOSIS — E538 Deficiency of other specified B group vitamins: Secondary | ICD-10-CM | POA: Diagnosis not present

## 2021-12-11 ENCOUNTER — Other Ambulatory Visit: Payer: Self-pay | Admitting: Family Medicine

## 2021-12-12 ENCOUNTER — Telehealth: Payer: Medicare Other

## 2021-12-18 ENCOUNTER — Encounter (HOSPITAL_COMMUNITY): Payer: Self-pay | Admitting: *Deleted

## 2021-12-18 ENCOUNTER — Emergency Department (HOSPITAL_COMMUNITY)
Admission: EM | Admit: 2021-12-18 | Discharge: 2021-12-18 | Disposition: A | Discharge status: Home / Self Care | Payer: Medicare Other | Attending: Student | Admitting: Student

## 2021-12-18 ENCOUNTER — Other Ambulatory Visit: Payer: Self-pay

## 2021-12-18 ENCOUNTER — Emergency Department (HOSPITAL_COMMUNITY): Payer: Medicare Other

## 2021-12-18 DIAGNOSIS — R1310 Dysphagia, unspecified: Secondary | ICD-10-CM | POA: Insufficient documentation

## 2021-12-18 DIAGNOSIS — Z79899 Other long term (current) drug therapy: Secondary | ICD-10-CM | POA: Insufficient documentation

## 2021-12-18 DIAGNOSIS — J45909 Unspecified asthma, uncomplicated: Secondary | ICD-10-CM | POA: Insufficient documentation

## 2021-12-18 DIAGNOSIS — I10 Essential (primary) hypertension: Secondary | ICD-10-CM | POA: Diagnosis not present

## 2021-12-18 MED ORDER — GLUCAGON HCL RDNA (DIAGNOSTIC) 1 MG IJ SOLR: 1.0000 mg | Freq: Once | INTRAMUSCULAR | Status: DC

## 2021-12-18 NOTE — Discharge Instructions (Signed)
You were seen in the emergency department for evaluation of a swallowed foreign body.  You have elected to forego CT imaging at this time and have confirmed with me that you will return to the emergency department immediately if your symptoms return or worsen.  Foregoing CT imaging is not recommended but your family is capable of making your medical decisions and thus it is extremely important that you return if your symptoms return. ?

## 2021-12-18 NOTE — ED Provider Notes (Signed)
?[facility] ?Provider Note ? ? ?CSN: [id number] ?Arrival date & time: 12/18/21  1524 ? ?  ? ?History ? ?Chief Complaint  ?Patient presents with  ? Foreign Body  ? ? ?Carol Massey is a 88 y.o. female. ? ?Patient with history of htn, chronic anemia, asthma, CVA, and GAD receiving home palliative care presenting for foreign body sensation in the esophagus. Son who is present in the room and is the patients primary caregiver provides a majority of the history.  According to the son, patient was eating a pineapple coconut smoothie around 1 PM today and suddenly felt that the smoothie had become stuck in her esophagus.  She has been attempting to cough and stick her finger down her throat to make herself vomit in order to get the food to go down and has not been successful.  She has also tried to drink water and had vomiting following.  Son states that she has had a history of this in the past and required esophageal dilation 5 years ago.  She has had a few episodes of this in the last few months but has been able to pass the food soon after without coming to the ER. Denies any shortness of breath, choking, or chest pain. ? ?The history is provided by the patient. No language interpreter was used.  ?Foreign Body ? ?  ? ?Home Medications ?Prior to Admission medications   ?Medication Sig Start Date End Date Taking? Authorizing Provider  ?Acetaminophen (TYLENOL EXTRA STRENGTH PO) Take 500 mg by mouth in the morning, at noon, and at bedtime.   Yes [provider]  ?albuterol (VENTOLIN HFA) 108 (90 Base) MCG/ACT inhaler INHALE 2 PUFFS INTO THE LUNGS EVERY 4 HOURS AS NEEDED FOR WHEEZING OR SHORTNESS OF BREATH. 03/08/21  Yes [name], MD  ?ALPRAZolam Duanne Moron) 0.25 MG tablet 1/2 tablet taken 3 times daily as needed for anxiety 09/10/21  Yes Luking, Scott A, MD  ?amLODipine (NORVASC) 2.5 MG tablet Take 1 tablet (2.5 mg total) by mouth daily. 09/10/21  Yes [name], MD  ?Calcium Carbonate  Antacid (TUMS PO) Take 1 tablet by mouth every 6 (six) hours as needed. 3 -4 a day   Yes [provider]  ?citalopram (CELEXA) 10 MG tablet Take 1 tablet (10 mg total) by mouth daily. 09/10/21  Yes [name], MD  ?Cyanocobalamin (VITAMIN B 12 PO) Take 1,000 mcg by mouth every other day.    Yes [provider]  ?Multiple Vitamins-Minerals (EMERGEN-C IMMUNE PO) Take 1 Package by mouth daily.   Yes [provider]  ?ondansetron (ZOFRAN ODT) 4 MG disintegrating tablet Take 1 tablet (4 mg total) by mouth every 8 (eight) hours as needed for nausea or vomiting. 09/12/21  Yes [name], MD  ?OVER THE COUNTER MEDICATION Take 1 tablet by mouth daily. Multivitamin qd   Yes [provider]  ?OVER THE COUNTER MEDICATION Take 1 tablet by mouth daily. Calcium '600mg'$  plus Vit D3 69mg qd   Yes [provider]  ?pantoprazole (PROTONIX) 40 MG tablet TAKE (1) TABLET BY MOUTH TWICE DAILY. 09/10/21  Yes Luking, SElayne Snare MD  ?potassium chloride 20 MEQ/15ML (10%) SOLN DILUTE 15 ML IN 8 OUNCES OF WATER OR JUICE AND TAKE ONCE DAILY. ?Patient taking differently: Take 20 mEq by mouth See admin instructions. Dilute 15 mls in 8 ounces of water or juice and take once daily 12/11/21  Yes Luking, SElayne Snare MD  ?sucralfate (CARAFATE) 1  g tablet TAKE 1 TABLET BY MOUTH 3 TIMES DAILY AS NEEDED. MAY BREAK UP AND MIX IN WATER. 03/27/21  Yes Luking, Elayne Snare, MD  ?SYMBICORT 80-4.5 MCG/ACT inhaler INHALE 2 PUFFS INTO THE LUNGS TWICE DAILY. 09/26/21  Yes [name], MD  ?tiotropium (SPIRIVA HANDIHALER) 18 MCG inhalation capsule INHALE 1 PUFF INTO THE LUNGS DAILY 09/10/21  Yes [name], MD  ?traMADol (ULTRAM) 50 MG tablet Take 1/2 tablet morning hours, may have 1 additional tablet later in the day as needed for pain 11/28/21  Yes Luking, Elayne Snare, MD  ?   ? ?Allergies    ?Tape   ? ?Review of Systems   ?Review of Systems  ?All other systems reviewed and are negative. ? ?Physical Exam ?Updated Vital  Signs ?BP 122/62   Pulse 89   Temp (!) 97.4 ?F (36.3 ?C)   Resp 15   Ht '4\' 10"'$  (1.473 m)   Wt 41.1 kg   LMP  (LMP Unknown)   SpO2 96%   BMI 18.94 kg/m?  ?Physical Exam ?Vitals and nursing note reviewed.  ?Constitutional:   ?   General: She is not in acute distress. ?   Appearance: Normal appearance. She is normal weight. She is not ill-appearing, toxic-appearing or diaphoretic.  ?   Comments: Patient chronically ill appearing resting comfortably in bed in no acute distress  ?HENT:  ?   Head: Normocephalic and atraumatic.  ?   Mouth/Throat:  ?   Comments: Patient tolerating her own secretions, no foreign body visualized in the oropharynx ?Cardiovascular:  ?   Rate and Rhythm: Normal rate.  ?Pulmonary:  ?   Effort: Pulmonary effort is normal. No respiratory distress.  ?   Breath sounds: Normal breath sounds. No stridor. No wheezing, rhonchi or rales.  ?Abdominal:  ?   General: Abdomen is flat.  ?   Palpations: Abdomen is soft.  ?Musculoskeletal:     ?   General: Normal range of motion.  ?   Cervical back: Normal range of motion and neck supple. No tenderness.  ?Skin: ?   General: Skin is warm and dry.  ?Neurological:  ?   General: No focal deficit present.  ?   Mental Status: She is alert.  ?Psychiatric:     ?   Mood and Affect: Mood normal.     ?   Behavior: Behavior normal.  ? ? ?ED Results / Procedures / Treatments   ?Labs ?(all labs ordered are listed, but only abnormal results are displayed) ?Labs Reviewed - No data to display ? ?EKG ?None ? ?Radiology ?No results found. ? ?Procedures ?Procedures  ? ? ?Medications Ordered in ED ?Medications - No data to display ? ?ED Course/ Medical Decision Making/ A&P ?  ?                        ?Medical Decision Making ? ?Patient with history of esophageal dilation 5 days ago presents today with complaints of foreign body sensation in the esophagus. She is afebrile, non-toxic appearing, and in no acute distress with reassuring vital signs. She is also currently  tolerating her own secretions. Lung sounds are present and clear to auscultation in all fields, low suspicion for aspiration. Will order CT chest with oral contrast to assess for foreign body. ? ?Nursing staff states that when CT came to take the patient for her scan the patient refused stating that she had felt the food stuck in her throat  originally had passed. I went and talked to the patient and brought her a Sprite and she was able to drink without vomiting and felt completely back to normal. I informed her that given her comorbid conditions and suspicion that she likely needs subsequent esophageal dilation given it has been 5 years and would really like her to stay for the scan, however she refused, stating that she will call her PCP tomorrow for outpatient management. She and her son also discussed care with my attending Dr. Matilde Sprang and decision was made that given her access to resources and close follow-up and her ability to swallow without emesis, she is stable for discharge at this time. Strict return precautions given if anything changes in the patients condition she is to return to the emergency department for further evaluation and management.  Patient and son expressed understanding and are amenable with plan.  Educated on red flag symptoms that would prompt immediate return.  Discharged stable condition. ? ? ?This is a shared visit with supervising physician Dr. Matilde Sprang who has independently evaluated patient & provided guidance in evaluation/management/disposition, in agreement with care  ? ? ?Final Clinical Impression(s) / ED Diagnoses ?Final diagnoses:  ?Dysphagia, unspecified type  ? ? ?Rx / DC Orders ?ED Discharge Orders   ? ? None  ? ?  ?An After Visit Summary was printed and given to the patient. ? ? ?  ?Bud Face, PA-C ?12/18/21 1806 ? ?  ?Teressa Lower, MD ?12/19/21 [phone] ? ?

## 2021-12-18 NOTE — ED Triage Notes (Signed)
Pt with hx of swallowing and choking on food for past 2 years.  Pt ate some pineapple and coconut and feels like stuck in throat. Pt not able to swallow any water.  ?

## 2021-12-18 NOTE — ED Notes (Signed)
Pt and family requesting to leave and for nurse to take out IV. Nurse informed PA and PA spoke with family. Nurse removed IV as family requested and family refused CT scan. Family states pt has swallowed food and would be more comfortable at home. Dr. Matilde Sprang aware and speaking with family.   ?

## 2021-12-18 NOTE — ED Notes (Signed)
Pt is able to drink water.  ?

## 2021-12-19 ENCOUNTER — Other Ambulatory Visit: Payer: Medicare Other

## 2021-12-24 ENCOUNTER — Other Ambulatory Visit: Payer: Self-pay | Admitting: Family Medicine

## 2021-12-24 DIAGNOSIS — I10 Essential (primary) hypertension: Secondary | ICD-10-CM

## 2021-12-24 DIAGNOSIS — D500 Iron deficiency anemia secondary to blood loss (chronic): Secondary | ICD-10-CM

## 2021-12-24 DIAGNOSIS — J431 Panlobular emphysema: Secondary | ICD-10-CM

## 2021-12-24 DIAGNOSIS — E876 Hypokalemia: Secondary | ICD-10-CM

## 2022-01-01 ENCOUNTER — Telehealth: Payer: Self-pay | Admitting: *Deleted

## 2022-01-01 NOTE — Telephone Encounter (Signed)
I am uncertain with her current situation.  It would be best that the least to do a phone visit or phone visit with patient/Carol Massey ?I am trying to understand whether or not this test will improve her overall quality of health? ?Please talk with her son we can try to arrange as best as possible ?

## 2022-01-01 NOTE — Telephone Encounter (Signed)
Home health speech therapist Barnett Applebaum called and stated they recommend an outpatient modified barium swallow to evaluate patient further for dysphagia ?

## 2022-01-02 ENCOUNTER — Telehealth: Payer: Self-pay | Admitting: *Deleted

## 2022-01-02 ENCOUNTER — Ambulatory Visit (INDEPENDENT_AMBULATORY_CARE_PROVIDER_SITE_OTHER): Payer: Medicare Other | Admitting: Family Medicine

## 2022-01-02 DIAGNOSIS — D500 Iron deficiency anemia secondary to blood loss (chronic): Secondary | ICD-10-CM

## 2022-01-02 DIAGNOSIS — T17308D Unspecified foreign body in larynx causing other injury, subsequent encounter: Secondary | ICD-10-CM | POA: Diagnosis not present

## 2022-01-02 LAB — POCT HEMOGLOBIN: Hemoglobin: 12.7 g/dL (ref 11–14.6)

## 2022-01-02 NOTE — Progress Notes (Signed)
? ?  Subjective:  ? ? Patient ID: Carol Massey, female    DOB: 10/29/1925, 88 y.o.   MRN: [id number] ? ?HPI ? ?Patient's son calls to discuss patient's dysphagia and the recommendations of Home Health speech therapy. Speech Therapist is recommending outpatient modified barium swallow to evaluate patient further for dysphagia. Patient came by office earlier in the morning for hemoglobin check for history of anemia. HGB 12.7- Consult with Dr Nicki Reaper - Hemoglobin is good and recheck in 6 weeks- some and patient notified and verbalized understanding. ?20-minute consultation held with the patient son over the phone regarding mom's intermittent dysphagia/choking spells.  We did discuss the benefit and the risk of a CT scan he will take it under consideration and get back with [place] within the next 2 to 3 days ?Virtual Visit via Telephone Note ? ?I connected with Abran Duke on 01/02/22 at  2:40 PM EDT by telephone and verified that I am speaking with the correct person using two identifiers. ? ?Location: ?Patient: home ?Provider: office ?  ?I discussed the limitations, risks, security and privacy concerns of performing an evaluation and management service by telephone and the availability of in person appointments. I also discussed with the patient that there may be a patient responsible charge related to this service. The patient expressed understanding and agreed to proceed. ? ? ?History of Present Illness: ? ?  ?Observations/Objective: ? ? ?Assessment and Plan: ? ? ?Follow Up Instructions: ? ?  ?I discussed the assessment and treatment plan with the patient. The patient was provided an opportunity to ask questions and all were answered. The patient agreed with the plan and demonstrated an understanding of the instructions. ?  ?The patient was advised to call back or seek an in-person evaluation if the symptoms worsen or if the condition fails to improve as anticipated. ? ?I provided 20 minutes of non-face-to-face time during  this encounter. ? ? ? ?Review of Systems ? ?   ?Objective:  ? Physical Exam ? ?Telephone consultation ? ? ?   ?Assessment & Plan:  ? ?Intermittent dysphagia versus choking ?Speech therapy recommends going ahead with we did discuss this in detail going ahead with a modified barium swallow for now family is taking this under advisement thinking about it and will get back to [place] by the end of the week ?Certainly palliative approach without doing this test would be reasonable as well and just adjusting the best 1 can ?

## 2022-01-02 NOTE — Telephone Encounter (Signed)
Patient and son scheduled telephone visit today 01/02/22 with Dr Nicki Reaper ?

## 2022-01-02 NOTE — Telephone Encounter (Signed)
[name], trussell are scheduled for a virtual visit with your provider today.   ? ?Just as we do with appointments in the office, we must obtain your consent to participate.  Your consent will be active for this visit and any virtual visit you may have with one of our providers in the next 365 days.   ? ?If you have a MyChart account, I can also send a copy of this consent to you electronically.  All virtual visits are billed to your insurance company just like a traditional visit in the office.  As this is a virtual visit, video technology does not allow for your provider to perform a traditional examination.  This may limit your provider's ability to fully assess your condition.  If your provider identifies any concerns that need to be evaluated in person or the need to arrange testing such as labs, EKG, etc, we will make arrangements to do so.   ? ?Although advances in technology are sophisticated, we cannot ensure that it will always work on either your end or our end.  If the connection with a video visit is poor, we may have to switch to a telephone visit.  With either a video or telephone visit, we are not always able to ensure that we have a secure connection.   I need to obtain your verbal consent now.   Are you willing to proceed with your visit today?  ? ?Carol Massey has provided verbal consent on 01/02/2022 for a virtual visit (video or telephone). ? ? ?  ?

## 2022-01-03 ENCOUNTER — Telehealth: Payer: Self-pay

## 2022-01-03 DIAGNOSIS — T17308D Unspecified foreign body in larynx causing other injury, subsequent encounter: Secondary | ICD-10-CM

## 2022-01-03 DIAGNOSIS — R1310 Dysphagia, unspecified: Secondary | ICD-10-CM

## 2022-01-03 NOTE — Telephone Encounter (Signed)
Modified Barium Swallow ordered in Epic. Referral coordinator notified. ?

## 2022-01-03 NOTE — Telephone Encounter (Signed)
Caller name:Vanessa Thunderbolt  ? ?On DPR? :YES ? ?Call back [phone] ? ?Provider they see: Wolfgang Phoenix  ? ?Reason for call:Pt son called and has decided to go with the barium swallow test  ? ?

## 2022-01-03 NOTE — Telephone Encounter (Signed)
Recommend modified barium swallow due to difficulty swallowing food and oral pharyngeal choking as well as dysphagia ? ?May go ahead with radiology message/order regarding this ?

## 2022-01-07 ENCOUNTER — Other Ambulatory Visit: Payer: Self-pay | Admitting: Family Medicine

## 2022-01-08 ENCOUNTER — Telehealth: Payer: Self-pay

## 2022-01-08 MED ORDER — MUPIROCIN 2 % EX OINT
TOPICAL_OINTMENT | CUTANEOUS | 2 refills | Status: AC
Start: 2022-01-08 — End: ?

## 2022-01-08 NOTE — Telephone Encounter (Signed)
Mupirocin 2% ointment is on patient historical med list. Please advise. Thank you ?

## 2022-01-08 NOTE — Telephone Encounter (Signed)
Caller name:[name] ? ?On DPR? :Yes ? ?Call back number:[phone] ? ?Provider they see: Wolfgang Phoenix  ? ?Reason for call:Carol Massey has been seen for this before she is getting red spot  on the left ear that was getting irritated and there was a cream that was prescribed before and [name] is wanting to know if there can be a cream called in again for it.  ? ?If medication can be called in can it be sent to [facility]  ? ?

## 2022-01-08 NOTE — Telephone Encounter (Signed)
Patient's son [name] informed prescription sent in. ?

## 2022-01-08 NOTE — Telephone Encounter (Signed)
3 refills 

## 2022-01-08 NOTE — Telephone Encounter (Signed)
Refills sent in

## 2022-01-09 ENCOUNTER — Ambulatory Visit (INDEPENDENT_AMBULATORY_CARE_PROVIDER_SITE_OTHER): Payer: Medicare Other | Admitting: *Deleted

## 2022-01-09 DIAGNOSIS — I10 Essential (primary) hypertension: Secondary | ICD-10-CM

## 2022-01-09 DIAGNOSIS — J431 Panlobular emphysema: Secondary | ICD-10-CM

## 2022-01-09 NOTE — Patient Instructions (Signed)
Visit Information ? ?Thank you for taking time to visit with me today. Please don't hesitate to contact me if I can be of assistance to you before our next scheduled telephone appointment. ? ?Following are the goals we discussed today:  ?Take medications as prescribed   ?Attend all scheduled provider appointments ?Call provider office for new concerns or questions  ?identify and remove indoor air pollutants ?eliminate symptom triggers at home ?follow rescue plan if symptoms flare-up ?get at least 7 to 8 hours of sleep at night ?do breathing exercises every day ?check blood pressure 3 times per week ?write blood pressure results in a log or diary ?take blood pressure log to all doctor appointments ?take medications for blood pressure exactly as prescribed ?eat more whole grains, fruits and vegetables, lean meats and healthy fats ?Continue to follow low sodium diet- avoid fast food, salty snacks, read lables ?Include adequate fiber into your diet  ?Follow safety precautions ?fall prevention strategies: change position slowly, use assistive device such as walker or cane (per provider recommendations) when walking, keep walkways clear, have good lighting in room. It is important to contact your provider if you have any falls, maintain muscle strength/tone by exercise per provider recommendations. ?Follow aspiration precautions for swallowing ?Continue working with home health speech therapist and nurse ? ?Our next appointment is by telephone on 03/20/22 at 130 pm ? ?Please call the care guide team at [phone] if you need to cancel or reschedule your appointment.  ? ?If you are experiencing a Mental Health or [place] or need someone to talk to, please call the Suicide and Crisis Lifeline: 988 ?call the [place] National Suicide Prevention Lifeline: 253-236-6780 or TTY: 631-426-8648 TTY 281-807-3197) to talk to a trained counselor ?call 1-800-273-TALK (toll free, 24 hour hotline) ?go to [facility] [address], [place] [phone]) ?call the [facility]: [phone] ?call 911  ? ?The patient verbalized understanding of instructions, educational materials, and care plan provided today and DECLINED offer to receive copy of patient instructions, educational materials, and care plan.  ? ?Jacqlyn Larsen RNC, BSN ?RN Case Manager ?[facility] [facility] ?[phone] ? ?

## 2022-01-09 NOTE — Chronic Care Management (AMB) (Signed)
Chronic Care Management   CCM RN Visit Note  01/09/2022 Name: Carol Massey MRN: [id number] DOB: 08/25/1926  Subjective: Carol Massey is a 88 y.o. year old female who is a primary care patient of Luking, Elayne Snare, MD. The care management team was consulted for assistance with disease management and care coordination needs.    Engaged with patient by telephone for follow up visit in response to provider referral for case management and/or care coordination services.   Consent to Services:  The patient was given information about Chronic Care Management services, agreed to services, and gave verbal consent prior to initiation of services.  Please see initial visit note for detailed documentation.   Patient agreed to services and verbal consent obtained.   Assessment: Review of patient past medical history, allergies, medications, health status, including review of consultants reports, laboratory and other test data, was performed as part of comprehensive evaluation and provision of chronic care management services.   SDOH (Social Determinants of Health) assessments and interventions performed:    CCM Care Plan  Allergies  Allergen Reactions   Tape     Pulls skin off    Outpatient Encounter Medications as of 01/09/2022  Medication Sig   Acetaminophen (TYLENOL EXTRA STRENGTH PO) Take 500 mg by mouth in the morning, at noon, and at bedtime.   albuterol (VENTOLIN HFA) 108 (90 Base) MCG/ACT inhaler INHALE 2 PUFFS INTO THE LUNGS EVERY 4 HOURS AS NEEDED FOR WHEEZING OR SHORTNESS OF BREATH.   ALPRAZolam (XANAX) 0.25 MG tablet 1/2 tablet taken 3 times daily as needed for anxiety   amLODipine (NORVASC) 2.5 MG tablet Take 1 tablet (2.5 mg total) by mouth daily.   Calcium Carbonate Antacid (TUMS PO) Take 1 tablet by mouth every 6 (six) hours as needed. 3 -4 a day   citalopram (CELEXA) 10 MG tablet Take 1 tablet (10 mg total) by mouth daily.   Cyanocobalamin (VITAMIN B 12 PO) Take 1,000 mcg by mouth  every other day.    Multiple Vitamins-Minerals (EMERGEN-C IMMUNE PO) Take 1 Package by mouth daily.   mupirocin ointment (BACTROBAN) 2 % Apply 1-2 times per day to affected area as needed   ondansetron (ZOFRAN ODT) 4 MG disintegrating tablet Take 1 tablet (4 mg total) by mouth every 8 (eight) hours as needed for nausea or vomiting.   OVER THE COUNTER MEDICATION Take 1 tablet by mouth daily. Multivitamin qd   OVER THE COUNTER MEDICATION Take 1 tablet by mouth daily. Calcium 616m plus Vit D3 267m qd   pantoprazole (PROTONIX) 40 MG tablet TAKE (1) TABLET BY MOUTH TWICE DAILY.   potassium chloride 20 MEQ/15ML (10%) SOLN DILUTE 15 ML IN 8 OUNCES OF WATER OR JUICE AND TAKE ONCE DAILY.   sucralfate (CARAFATE) 1 g tablet TAKE 1 TABLET BY MOUTH 3 TIMES DAILY AS NEEDED. MAY BREAK UP AND MIX IN WATER.   SYMBICORT 80-4.5 MCG/ACT inhaler INHALE 2 PUFFS INTO THE LUNGS TWICE DAILY.   tiotropium (SPIRIVA HANDIHALER) 18 MCG inhalation capsule INHALE 1 PUFF INTO THE LUNGS DAILY   traMADol (ULTRAM) 50 MG tablet Take 1/2 tablet morning hours, may have 1 additional tablet later in the day as needed for pain   Facility-Administered Encounter Medications as of 01/09/2022  Medication   sodium chloride flush (NS) 0.9 % injection 10 mL    Patient Active Problem List   Diagnosis Date Noted   LUQ abdominal pain 09/22/2019   Nausea without vomiting 09/22/2019   Belching 09/22/2019  Constipation 05/18/2019   Abdominal pain 05/18/2019   Vomiting 05/18/2019   Intertrochanteric fracture of right hip ([place]) 12/28/2018   Closed right hip fracture, initial encounter ([place]) 12/27/2018   Anemia, chronic disease 01/21/2018   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   Essential hypertension 12/12/2016   History of peptic ulcer disease    Ataxia 12/01/2015   PUD (peptic ulcer disease)    Melena 11/27/2015   Upper GI bleed 11/27/2015   Tobacco abuse 11/27/2015   Azotemia 11/27/2015   Right-sided chest pain 11/27/2015   Dysphagia     Generalized anxiety disorder 07/24/2015   Anemia due to GI blood loss 05/27/2014   Hyperlipemia 03/18/2013   COPD (chronic obstructive pulmonary disease) ([place]) 01/12/2013   Osteoporosis 01/12/2013    Conditions to be addressed/monitored:HTN and COPD  Care Plan : RN Care Manager plan of care  Updates made by Kassie Mends, RN since 01/09/2022 12:00 AM     Problem: No plan of care established for management of chronic disease states (HTN, COPD)   Priority: High     Long-Range Goal: Development of plan of care for chronic disease management (HTN, COPD)   Start Date: 07/18/2021  Expected End Date: 07/08/2022  Priority: High  Note:   Current Barriers:  Knowledge Deficits related to plan of care for management of HTN and COPD  No Advanced Directives in place - per son, received advanced directives packet, has not completed  Knowledge Deficits related to basic understanding of hypertension pathophysiology and self care management- patient lives with adult son who is primary caregiver, pt has CNA from [place] that sees her M-F 11am-3pm and son pays out of pocket for one hour on Saturday to assist with bathing patient.  Pt is able to transfer from wheelchair to bed, to commode etc and can walk short distances with assistance of another person, wheelchair and gait belt.  Pt had continues to have issues with swallowing at times (seen in the ED 4/25 for dysphagia), dysphagia happens on occasion and speech therapy (home health) has been working with patient and continues once weekly, RN continues to work with pt also. Pt is being scheduled for barium swallow test.  Son reports CNA monitors patient's blood pressure as well as he does (but this is not daily), visiting RN from palliative care (once month) completes assessment and [place] Teaching laboratory technician) is engaged with patient.  Patient has had 2 IV Feraheme treatments for anemia and son reports "she gets them if she needs them, depending on  the bloodwork" Knowledge deficits related to basic understanding of COPD disease process- patient/ caregiver needs teaching/ reinforcement COPD action plan Unable to perform ADLs independently- son provides oversight with all aspects of patient's care including medications Unable to perform IADLs independently- son assists  RNCM Clinical Goal(s):  Patient will verbalize understanding of plan for management of HTN and COPD as evidenced by patient/ son report, review of EHR and  through collaboration with RN Care manager, provider, and care team.  Interventions: 1:1 collaboration with primary care provider regarding development and update of comprehensive plan of care as evidenced by provider attestation and co-signature Inter-disciplinary care team collaboration (see longitudinal plan of care) Evaluation of current treatment plan related to  self management and patient's adherence to plan as established by provider  Hypertension Interventions: Last practice recorded BP readings:  BP Readings from Last 3 Encounters:  02/28/21 134/70  11/10/20 120/64  08/30/20 110/70  Most recent eGFR/CrCl: No results found for: EGFR  No components found for: CRCL  Evaluation of current treatment plan related to hypertension self management and patient's adherence to plan as established by provider Reviewed medications with patient and discussed importance of compliance Screening for signs and symptoms of depression related to chronic disease state  Reinforced low sodium diet Reviewed with son to check patient's blood pressure at least 3 x per week and record Reinforced importance of including fiber and adequate fluids in diet to help prevent constipation  COPD Interventions:   Advised patient to self assesses COPD action plan zone and make appointment with provider if in the yellow zone for 48 hours without improvement Provided education about and advised patient to utilize infection prevention strategies  to reduce risk of respiratory infection Discussed the importance of adequate rest and management of fatigue with COPD Reviewed good handwashing practices, wearing a mask as needed, avoiding sick people  Reviewed importance of using inhalers as prescribed Pain assessment completed  Patient Goals/Self-Care Activities: Take medications as prescribed   Attend all scheduled provider appointments Call provider office for new concerns or questions  identify and remove indoor air pollutants eliminate symptom triggers at home follow rescue plan if symptoms flare-up get at least 7 to 8 hours of sleep at night do breathing exercises every day check blood pressure 3 times per week write blood pressure results in a log or diary take blood pressure log to all doctor appointments take medications for blood pressure exactly as prescribed eat more whole grains, fruits and vegetables, lean meats and healthy fats Continue to follow low sodium diet- avoid fast food, salty snacks, read lables Include adequate fiber into your diet  Follow safety precautions fall prevention strategies: change position slowly, use assistive device such as walker or cane (per provider recommendations) when walking, keep walkways clear, have good lighting in room. It is important to contact your provider if you have any falls, maintain muscle strength/tone by exercise per provider recommendations. Follow aspiration precautions for swallowing Continue working with home health speech therapist and nurse       Plan:Telephone follow up appointment with care management team member scheduled for:  03/20/22  Jacqlyn Larsen [facility], BSN RN Case Manager [place] [phone]

## 2022-01-13 DIAGNOSIS — R1312 Dysphagia, oropharyngeal phase: Secondary | ICD-10-CM | POA: Diagnosis not present

## 2022-01-13 DIAGNOSIS — J431 Panlobular emphysema: Secondary | ICD-10-CM | POA: Diagnosis not present

## 2022-01-13 DIAGNOSIS — M6281 Muscle weakness (generalized): Secondary | ICD-10-CM | POA: Diagnosis not present

## 2022-01-13 DIAGNOSIS — J441 Chronic obstructive pulmonary disease with (acute) exacerbation: Secondary | ICD-10-CM | POA: Diagnosis not present

## 2022-01-13 DIAGNOSIS — R634 Abnormal weight loss: Secondary | ICD-10-CM

## 2022-01-23 DIAGNOSIS — Z87891 Personal history of nicotine dependence: Secondary | ICD-10-CM | POA: Diagnosis not present

## 2022-01-23 DIAGNOSIS — J431 Panlobular emphysema: Secondary | ICD-10-CM

## 2022-01-23 DIAGNOSIS — I10 Essential (primary) hypertension: Secondary | ICD-10-CM

## 2022-01-23 DIAGNOSIS — J449 Chronic obstructive pulmonary disease, unspecified: Secondary | ICD-10-CM | POA: Diagnosis not present

## 2022-02-01 ENCOUNTER — Other Ambulatory Visit: Payer: Self-pay | Admitting: Family Medicine

## 2022-02-05 ENCOUNTER — Other Ambulatory Visit: Payer: Self-pay

## 2022-02-07 ENCOUNTER — Other Ambulatory Visit (INDEPENDENT_AMBULATORY_CARE_PROVIDER_SITE_OTHER): Payer: Medicare Other | Admitting: *Deleted

## 2022-02-07 DIAGNOSIS — D500 Iron deficiency anemia secondary to blood loss (chronic): Secondary | ICD-10-CM

## 2022-02-07 LAB — POCT HEMOGLOBIN: Hemoglobin: 11.9 g/dL (ref 11–14.6)

## 2022-02-07 NOTE — Progress Notes (Signed)
Patient arrives for Hgb check. Patient has hx of anemia and receives infusions thru hematology. Hgb today 11.9.  Consult with Dr Lacinda Axon. Hgb within range for patient, Recheck in 2-4 weeks and if notices any bleeding go straight to ER. Margorie John (DPR)verbalized understanding and patient scheduled for hgb recheck 02/22/22 at 10:30am

## 2022-02-11 ENCOUNTER — Other Ambulatory Visit: Payer: Self-pay | Admitting: Family Medicine

## 2022-02-18 ENCOUNTER — Telehealth: Payer: Self-pay | Admitting: Family Medicine

## 2022-02-18 NOTE — Telephone Encounter (Signed)
Patient's son informed of md. Verbalized understanding. Advised pt on how to insert suppository. Verbalized understanding.

## 2022-02-18 NOTE — Telephone Encounter (Signed)
Pt son calling to get advice on who he can get to come out and give mom a suppository. Pt states he has called Home Health ([facility] and [facility]) but no luck. Pt states mom has been constipated for a few days. Pt states he has not given anyone a suppository before and doesn't want to start with mom. Please advise. Thank you.

## 2022-02-22 ENCOUNTER — Other Ambulatory Visit: Payer: Self-pay | Admitting: Family Medicine

## 2022-02-22 ENCOUNTER — Telehealth: Payer: Self-pay | Admitting: *Deleted

## 2022-02-22 ENCOUNTER — Other Ambulatory Visit (INDEPENDENT_AMBULATORY_CARE_PROVIDER_SITE_OTHER): Payer: Medicare Other | Admitting: *Deleted

## 2022-02-22 ENCOUNTER — Other Ambulatory Visit: Payer: Self-pay

## 2022-02-22 DIAGNOSIS — D6489 Other specified anemias: Secondary | ICD-10-CM

## 2022-02-22 DIAGNOSIS — R64 Cachexia: Secondary | ICD-10-CM

## 2022-02-22 LAB — POCT HEMOGLOBIN: Hemoglobin: 12.3 g/dL (ref 11–14.6)

## 2022-02-22 NOTE — Telephone Encounter (Signed)
Spoke with [name] and informed per drs orders and recommendations.

## 2022-02-22 NOTE — Telephone Encounter (Signed)
Nurses It appears the patient is doing blood work in August with follow-up with hematology virtual visit We can also order metabolic 7 and liver due to cachexia, weight loss this could be drawn at the same time as a blood work in August but [name] will need to specifically make sure that both hematology and our blood work is being drawn at that time.  I can also do a home visit either late July or into August you can see which one works best for [name] to let me know then we can set it up thanks

## 2022-02-22 NOTE — Telephone Encounter (Signed)
Patient came in today for recheck of hgb.  Hgb was 12.3. Patient's son says she is due to have a full blood work up in August. When does she need to come back for recheck of hgb?  Please advise. Thank you

## 2022-02-23 ENCOUNTER — Other Ambulatory Visit: Payer: Self-pay | Admitting: Family Medicine

## 2022-02-27 ENCOUNTER — Other Ambulatory Visit: Payer: Self-pay | Admitting: Family Medicine

## 2022-02-27 DIAGNOSIS — D500 Iron deficiency anemia secondary to blood loss (chronic): Secondary | ICD-10-CM

## 2022-02-27 DIAGNOSIS — J431 Panlobular emphysema: Secondary | ICD-10-CM

## 2022-02-27 DIAGNOSIS — I10 Essential (primary) hypertension: Secondary | ICD-10-CM

## 2022-02-27 DIAGNOSIS — E876 Hypokalemia: Secondary | ICD-10-CM

## 2022-02-27 NOTE — Telephone Encounter (Signed)
May have 6 months ?

## 2022-03-13 ENCOUNTER — Encounter: Payer: Self-pay | Admitting: *Deleted

## 2022-03-13 ENCOUNTER — Ambulatory Visit (INDEPENDENT_AMBULATORY_CARE_PROVIDER_SITE_OTHER): Payer: Medicare Other | Admitting: *Deleted

## 2022-03-13 DIAGNOSIS — I10 Essential (primary) hypertension: Secondary | ICD-10-CM

## 2022-03-13 DIAGNOSIS — J431 Panlobular emphysema: Secondary | ICD-10-CM

## 2022-03-13 NOTE — Patient Instructions (Signed)
Visit Information  Thank you for taking time to visit with me today. Please don't hesitate to contact me if I can be of assistance to you before our next scheduled telephone appointment.  Following are the goals we discussed today:  Take medications as prescribed   Attend all scheduled provider appointments Call provider office for new concerns or questions  identify and remove indoor air pollutants limit outdoor activity during cold weather listen for public air quality announcements every day eliminate symptom triggers at home follow rescue plan if symptoms flare-up get at least 7 to 8 hours of sleep at night do breathing exercises every day check blood pressure 3 times per week write blood pressure results in a log or diary keep a blood pressure log take blood pressure log to all doctor appointments take medications for blood pressure exactly as prescribed report new symptoms to your doctor eat more whole grains, fruits and vegetables, lean meats and healthy fats Continue to follow low sodium diet- avoid fast food, salty snacks, read lables Include adequate fiber into your diet  Follow safety precautions fall prevention strategies: change position slowly, use assistive device such as walker or cane (per provider recommendations) when walking, keep walkways clear, have good lighting in room. It is important to contact your provider if you have any falls, maintain muscle strength/tone by exercise per provider recommendations. Follow aspiration precautions for swallowing Continue working with palliative care nurse monthly Expect a call from pharmacist for assistance with spiriva and symbicort   Please call the care guide team at [phone] if you need to cancel or reschedule your appointment.   If you are experiencing a Mental Health or [place] or need someone to talk to, please call the Suicide and Crisis Lifeline: 988 call the [place] National Suicide Prevention  Lifeline: 940-279-0542 or TTY: 6611860442 TTY (701) 017-4430) to talk to a trained counselor call 1-800-273-TALK (toll free, 24 hour hotline) go to [facility] [address], [place] [phone]) call the [place]: [phone] call 911   The patient verbalized understanding of instructions, educational materials, and care plan provided today and DECLINED offer to receive copy of patient instructions, educational materials, and care plan.   Jacqlyn Larsen [facility], BSN RN Case Manager [facility] [phone]

## 2022-03-13 NOTE — Chronic Care Management (AMB) (Signed)
Chronic Care Management   CCM RN Visit Note  03/13/2022 Name: Carol Massey MRN: [id number] DOB: 1926-04-12  Subjective: Carol Massey is a 88 y.o. year old female who is a primary care patient of Luking, Elayne Snare, MD. The care management team was consulted for assistance with disease management and care coordination needs.    Engaged with patient by telephone for follow up visit in response to provider referral for case management and/or care coordination services.   Consent to Services:  The patient was given information about Chronic Care Management services, agreed to services, and gave verbal consent prior to initiation of services.  Please see initial visit note for detailed documentation.   Patient agreed to services and verbal consent obtained.   Assessment: Review of patient past medical history, allergies, medications, health status, including review of consultants reports, laboratory and other test data, was performed as part of comprehensive evaluation and provision of chronic care management services.   SDOH (Social Determinants of Health) assessments and interventions performed:    CCM Care Plan  Allergies  Allergen Reactions   Tape     Pulls skin off    Outpatient Encounter Medications as of 03/13/2022  Medication Sig   Acetaminophen (TYLENOL EXTRA STRENGTH PO) Take 500 mg by mouth in the morning, at noon, and at bedtime.   albuterol (VENTOLIN HFA) 108 (90 Base) MCG/ACT inhaler INHALE 2 PUFFS INTO THE LUNGS EVERY 4 HOURS AS NEEDED FOR WHEEZING OR SHORTNESS OF BREATH.   ALPRAZolam (XANAX) 0.25 MG tablet TAKE 1/2 TABLET BY MOUTH THREE TIMES DAILY AS NEEDED.   amLODipine (NORVASC) 2.5 MG tablet Take 1 tablet (2.5 mg total) by mouth daily.   Calcium Carbonate Antacid (TUMS PO) Take 1 tablet by mouth every 6 (six) hours as needed. 3 -4 a day   citalopram (CELEXA) 10 MG tablet TAKE ONE TABLET BY MOUTH DAILY.   Cyanocobalamin (VITAMIN B 12 PO) Take 1,000 mcg by mouth every other  day.    Multiple Vitamins-Minerals (EMERGEN-C IMMUNE PO) Take 1 Package by mouth daily.   mupirocin ointment (BACTROBAN) 2 % Apply 1-2 times per day to affected area as needed   ondansetron (ZOFRAN ODT) 4 MG disintegrating tablet Take 1 tablet (4 mg total) by mouth every 8 (eight) hours as needed for nausea or vomiting.   OVER THE COUNTER MEDICATION Take 1 tablet by mouth daily. Multivitamin qd   OVER THE COUNTER MEDICATION Take 1 tablet by mouth daily. Calcium 677m plus Vit D3 267m qd   pantoprazole (PROTONIX) 40 MG tablet TAKE (1) TABLET BYMOUTH TWICE DAILY.   Potassium Chloride 10 % SOLN DILUTE 15 ML IN 8 OUNCES OF WATER OR JUICE AND TAKE ONCE DAILY.   sucralfate (CARAFATE) 1 g tablet TAKE 1 TABLET BY MOUTH 3 TIMES DAILY AS NEEDED. MAY BREAK UP AND MIX IN WATER.   SYMBICORT 80-4.5 MCG/ACT inhaler INHALE 2 PUFFS INTO THE LUNGS TWICE DAILY.   tiotropium (SPIRIVA HANDIHALER) 18 MCG inhalation capsule INHALE 1 PUFF INTO THE LUNGS DAILY   traMADol (ULTRAM) 50 MG tablet TAKE 1/2 TABLET IN THE MORNING HOURS, MAY HAVE 1 ADDITIONAL TABLET LATER IN THE DAY AS NEEDED FOR PAIN.   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  Essential hypertension 12/12/2016   Ataxia 12/01/2015   PUD (peptic ulcer disease)    Upper GI bleed 11/27/2015   Tobacco abuse 11/27/2015   Generalized anxiety disorder 07/24/2015   Anemia due to GI blood loss 05/27/2014   Hyperlipemia 03/18/2013   COPD (chronic obstructive pulmonary disease) ([place]) 01/12/2013   Osteoporosis 01/12/2013    Conditions to be addressed/monitored:HTN and COPD  Care Plan : RN Care Manager plan of care  Updates made by Kassie Mends, RN since 03/13/2022 12:00 AM     Problem: No  plan of care established for management of chronic disease states (HTN, COPD)   Priority: High     Long-Range Goal: Development of plan of care for chronic disease management (HTN, COPD)   Start Date: 07/18/2021  Expected End Date: 07/08/2022  Priority: High  Note:   Current Barriers:  Knowledge Deficits related to plan of care for management of HTN and COPD  No Advanced Directives in place - per son, received advanced directives packet, has not completed  Knowledge Deficits related to basic understanding of hypertension pathophysiology and self care management- patient lives with adult son who is primary caregiver, pt has CNA from [place] that sees her M-F 11am-3pm and son pays out of pocket for one hour on Saturday to assist with bathing patient.  Pt is able to transfer from wheelchair to bed, to commode etc and can walk short distances with assistance of another person, wheelchair and gait belt.  Pt had continues to have issues with swallowing at times (seen in the ED 4/25 for dysphagia), dysphagia happens on occasion and speech therapy (home health) had been working with pt and has now discharged. RN has also discharged, pt continues completing swallowing exercises.  Son reports CNA monitors patient's blood pressure as well as he does (but this is not daily), visiting RN from palliative care (once month) completes assessment and [place] Teaching laboratory technician) is engaged with patient.  Patient has had 2 IV Feraheme treatments for anemia and son reports "she gets them if she needs them, depending on the bloodwork", next blood work due 04/08/22, son reports primary care provider will be making a home visit to patient next week, reports pt pays 39$ monthly for symbicort and $39 monthly for spiriva and wants to know if there is any assistance as this is expensive for pt.  Knowledge deficits related to basic understanding of COPD disease process Unable to perform ADLs independently- son provides  oversight with all aspects of patient's care including medications Unable to perform IADLs independently- son assists  RNCM Clinical Goal(s):  Patient will verbalize understanding of plan for management of HTN and COPD as evidenced by patient/ son report, review of EHR and  through collaboration with RN Care manager, provider, and care team.  Interventions: 1:1 collaboration with primary care provider regarding development and update of comprehensive plan of care as evidenced by provider attestation and co-signature Inter-disciplinary care team collaboration (see longitudinal plan of care) Evaluation of current treatment plan related to  self management and patient's adherence to plan as established by provider  Hypertension Interventions: Last practice recorded BP readings:  BP Readings from Last 3 Encounters:  02/28/21 134/70  11/10/20 120/64  08/30/20 110/70  Most recent eGFR/CrCl: No results found for: EGFR  No components found for: CRCL  Evaluation of current treatment plan related to hypertension self management and patient's adherence to plan as established by provider Reviewed medications with patient and discussed importance of compliance Screening  for signs and symptoms of depression related to chronic disease state  Reviewed low sodium diet, limit fast food Reinforced with son to check patient's blood pressure at least 3 x per week and record Reviewed importance of including fiber and adequate fluids in diet to help prevent constipation Reviewed plan of care with patient's son, will close case for embedded services, in basket sent to have pt now followed by care coordination nurse  COPD Interventions:   Advised patient to self assesses COPD action plan zone and make appointment with provider if in the yellow zone for 48 hours without improvement Provided education about and advised patient to utilize infection prevention strategies to reduce risk of respiratory  infection Discussed the importance of adequate rest and management of fatigue with COPD Reinforced good handwashing practices, wearing a mask as needed, avoiding sick people  Reviewed importance of using inhalers as prescribed Pain assessment completed Referral placed for [facility] pharmacist to outreach pt for medication assistance symbicort and spiriva  Patient Goals/Self-Care Activities: Take medications as prescribed   Attend all scheduled provider appointments Call provider office for new concerns or questions  identify and remove indoor air pollutants limit outdoor activity during cold weather listen for public air quality announcements every day eliminate symptom triggers at home follow rescue plan if symptoms flare-up get at least 7 to 8 hours of sleep at night do breathing exercises every day check blood pressure 3 times per week write blood pressure results in a log or diary keep a blood pressure log take blood pressure log to all doctor appointments take medications for blood pressure exactly as prescribed report new symptoms to your doctor eat more whole grains, fruits and vegetables, lean meats and healthy fats Continue to follow low sodium diet- avoid fast food, salty snacks, read lables Include adequate fiber into your diet  Follow safety precautions fall prevention strategies: change position slowly, use assistive device such as walker or cane (per provider recommendations) when walking, keep walkways clear, have good lighting in room. It is important to contact your provider if you have any falls, maintain muscle strength/tone by exercise per provider recommendations. Follow aspiration precautions for swallowing Continue working with palliative care nurse monthly Expect a call from pharmacist for assistance with spiriva and symbicort       Plan:No further follow up required: case closed for embedded RN care management services, pt will now be followed by care  coordination RN, pharmacist will outreach for medication assistance.  Jacqlyn Larsen [facility], BSN RN Case Manager [facility] [phone]

## 2022-03-20 ENCOUNTER — Ambulatory Visit: Payer: Medicare Other | Admitting: Family Medicine

## 2022-03-20 ENCOUNTER — Telehealth: Payer: Self-pay

## 2022-03-20 DIAGNOSIS — R64 Cachexia: Secondary | ICD-10-CM | POA: Diagnosis not present

## 2022-03-20 DIAGNOSIS — J431 Panlobular emphysema: Secondary | ICD-10-CM

## 2022-03-20 DIAGNOSIS — R229 Localized swelling, mass and lump, unspecified: Secondary | ICD-10-CM | POA: Diagnosis not present

## 2022-03-20 DIAGNOSIS — R1312 Dysphagia, oropharyngeal phase: Secondary | ICD-10-CM | POA: Diagnosis not present

## 2022-03-20 NOTE — Progress Notes (Signed)
   Subjective:    Patient ID: Carol Massey, female    DOB: 1925/08/28, 88 y.o.   MRN: [id number]  HPI  Home health check  Per son there is a place on her L shoulder that may need evaluation Presents as a home visit She is homebound Hospice patient She tends to eat fairly well but her son states that she intermittently chokes on what she is eating and has difficulty with swallowing This is causing and impede meant on her quality of life In addition to this she has a area on the left shoulder that has become progressively larger started off as a small bump now it is a bigger bump mainly firm not draining anything does not look infected No vomiting spells she is losing weight she has end-stage COPD She has exceeded expectations regarding longevity but her family and hospice care doing a very good job with her Review of Systems     Objective:   Physical Exam Lungs are clear heart regular cachectic growth in the shoulder and the skin on the left side does not appear cancerous but if it becomes ulcerated or infected will need to be dealt with otherwise we will just watch this its approximately 1/2 inch in diameter-5/8 inch       Assessment & Plan:   End-stage COPD Cachexia Weight loss Intermittent choking Consider speech therapy to help her with her swallowing if possible Growth on the left shoulder benign Monitor closely No intervention for this currently

## 2022-03-21 ENCOUNTER — Telehealth: Payer: Self-pay | Admitting: Pharmacist

## 2022-03-21 ENCOUNTER — Other Ambulatory Visit: Payer: Self-pay | Admitting: Family Medicine

## 2022-03-21 DIAGNOSIS — I10 Essential (primary) hypertension: Secondary | ICD-10-CM

## 2022-03-21 DIAGNOSIS — J431 Panlobular emphysema: Secondary | ICD-10-CM

## 2022-03-21 DIAGNOSIS — D500 Iron deficiency anemia secondary to blood loss (chronic): Secondary | ICD-10-CM

## 2022-03-21 DIAGNOSIS — E876 Hypokalemia: Secondary | ICD-10-CM

## 2022-03-21 NOTE — Progress Notes (Signed)
Amber will you schedule follow up with RNCM Joellyn Quails for end of August first of September. Routed message for [employer].  Thank you   Russell  Direct Dial: [phone]

## 2022-03-21 NOTE — Progress Notes (Signed)
Referral placed to home health

## 2022-03-21 NOTE — Progress Notes (Addendum)
[facility]) Care Management  [place]   03/21/2022  Carol Massey 06-Aug-1926 [id number]  Reason for referral: Medication Assistance  Referral source: Proficient Referral medication(s):    Symbicort 80/4.5 and Spiriva    Current insurance [employer]   HPI: COPD, osteoporosis, anemia, ataxia, hyperlipidemia, anxiety, and history of tobacco abuse  Objective: Allergies  Allergen Reactions   Tape     Pulls skin off    Medications Reviewed Today     Reviewed by [name], LPN (Licensed Practical Nurse) on 03/20/22 at 1641  Med List Status: <None>   Medication Order Taking? Sig Documenting Provider Last Dose Status Informant  Acetaminophen (TYLENOL EXTRA STRENGTH PO) [id number] No Take 500 mg by mouth in the morning, at noon, and at bedtime. [provider] Taking Active Child, Pharmacy Records           Med Note [name], [name]   Fri Aug 24, 2021  1:11 PM)    albuterol (VENTOLIN HFA) 108 (90 Base) MCG/ACT inhaler [id number] No INHALE 2 PUFFS INTO THE LUNGS EVERY 4 HOURS AS NEEDED FOR WHEEZING OR SHORTNESS OF BREATH. [name], MD Taking Active Child, Pharmacy Records  ALPRAZolam Duanne Moron) 0.25 MG tablet [id number] No TAKE 1/2 TABLET BY MOUTH THREE TIMES DAILY AS NEEDED. [name], MD Taking Active   amLODipine (NORVASC) 2.5 MG tablet [id number] No Take 1 tablet (2.5 mg total) by mouth daily. [name], MD Taking Active Child, Pharmacy Records  Calcium Carbonate Antacid (TUMS PO) [id number] No Take 1 tablet by mouth every 6 (six) hours as needed. 3 -4 a day [provider] Taking Active Child, Pharmacy Records  citalopram (CELEXA) 10 MG tablet [id number] No TAKE ONE TABLET BY MOUTH DAILY. [name], MD Taking Active   Cyanocobalamin (VITAMIN B 12 PO) [id number] No Take 1,000 mcg by mouth every other day.  [provider] Taking Active Child, Pharmacy Records  Multiple Vitamins-Minerals (EMERGEN-C  IMMUNE PO) [id number] No Take 1 Package by mouth daily. [provider] Taking Active Child, Pharmacy Records  mupirocin ointment (BACTROBAN) 2 % [id number] No Apply 1-2 times per day to affected area as needed Luking, Elayne Snare, MD Taking Active   ondansetron (ZOFRAN ODT) 4 MG disintegrating tablet [id number] No Take 1 tablet (4 mg total) by mouth every 8 (eight) hours as needed for nausea or vomiting. [name], MD Taking Active Child, Pharmacy Records  OVER THE COUNTER MEDICATION [id number] No Take 1 tablet by mouth daily. Multivitamin qd [provider] Taking Active Child, Pharmacy Records  OVER THE [place] [id number] No Take 1 tablet by mouth daily. Calcium '600mg'$  plus Vit D3 39mg qd [provider] Taking Active Child, Pharmacy Records  pantoprazole (PROTONIX) 40 MG tablet [id number] TAKE (1) TABLET BYMOUTH TWICE DAILY. LKathyrn Drown MD Taking Active   Potassium Chloride 10 % SOLN [id number] DILUTE 15 ML IN 8 OUNCES OF WATER OR JUICE AND TAKE ONCE DAILY. LKathyrn Drown MD Taking Active   sucralfate (CARAFATE) 1 g tablet [id number] TAKE 1 TABLET BY MOUTH 3 TIMES DAILY AS NEEDED. MAY BREAK UP AND MIX IN WATER. LKathyrn Drown MD Taking Active Child, Pharmacy Records  SYMBICORT 80-4.5 MCG/ACT inhaler [id number] INHALE 2 PUFFS INTO THE LUNGS TWICE DAILY. LKathyrn Drown MD Taking Active   tiotropium ([facility]) 18 MCG inhalation capsule [id number] INHALE 1 PUFF INTO THE LUNGS DAILY Luking, SElayne Snare MD Taking  Active Child, Pharmacy Records  traMADol (ULTRAM) 50 MG tablet [id number] No TAKE 1/2 TABLET IN THE MORNING HOURS, MAY HAVE 1 ADDITIONAL TABLET LATER IN THE DAY AS NEEDED FOR PAIN. [name], MD Taking Active              Medication Assistance Findings:  Medication assistance needs identified: Spoke with patient's son. HIPAA identifiers were obtained.  He expressed concern over affording Symbicort and Spiriva.   Unfortunately, Symbicort stopped being available through patient assistance programming as of 03/26/2022.  If deemed therapeutically appropriate, the patient's provider could switch to [place]  (budesonide/glycopyrrolate/formoterol fumarate) as it is available through [place]'s patient assistance program.  [name] is a combination product (triple therapy) much like Spiriva AND Symbicort in combination.   Additional medication assistance options reviewed with patient as warranted:  No other options identified  Plan: Note will be routed to the patient's provider about switching from Symbicort and Spiriva to [place].  After hearing back from the provider, the patient assistance process will be completed.  [name], PharmD, [place] Clinical Pharmacist [phone]  Resent to provider 03/26/22

## 2022-03-21 NOTE — Progress Notes (Signed)
Created in error

## 2022-03-22 ENCOUNTER — Other Ambulatory Visit: Payer: Self-pay | Admitting: Family Medicine

## 2022-03-22 DIAGNOSIS — R1312 Dysphagia, oropharyngeal phase: Secondary | ICD-10-CM

## 2022-03-25 ENCOUNTER — Telehealth: Payer: Self-pay | Admitting: *Deleted

## 2022-03-25 DIAGNOSIS — I10 Essential (primary) hypertension: Secondary | ICD-10-CM | POA: Diagnosis not present

## 2022-03-25 DIAGNOSIS — J431 Panlobular emphysema: Secondary | ICD-10-CM | POA: Diagnosis not present

## 2022-03-25 NOTE — Telephone Encounter (Signed)
Speech Therapy with [facility] called with FYI that sone requested appt scheduled for today be moved to Wednesday due to scheduling conflict on his part

## 2022-03-28 ENCOUNTER — Other Ambulatory Visit: Payer: Self-pay | Admitting: *Deleted

## 2022-03-28 MED ORDER — BREZTRI AEROSPHERE 160-9-4.8 MCG/ACT IN AERO: 2.0000 | INHALATION_SPRAY | Freq: Two times a day (BID) | RESPIRATORY_TRACT | 2 refills | Status: DC

## 2022-03-30 ENCOUNTER — Other Ambulatory Visit: Payer: Self-pay | Admitting: Family Medicine

## 2022-04-02 ENCOUNTER — Telehealth: Payer: Self-pay | Admitting: Pharmacy Technician

## 2022-04-02 DIAGNOSIS — Z596 Low income: Secondary | ICD-10-CM

## 2022-04-02 NOTE — Progress Notes (Signed)
[facility])                                            [place] Team    04/02/2022  Carol Massey August 05, 1926 [id number]                                      Medication Assistance Referral  Referral From: [place]  Medication/Company: [name] / [place] Patient application portion:  Mailed Provider application portion: Faxed  to Dr. Sallee Lange Provider address/fax verified via: Office website    Carol Massey P. Carol Massey, Carol Massey  [phone]

## 2022-04-08 ENCOUNTER — Inpatient Hospital Stay: Payer: Medicare Other | Attending: Physician Assistant

## 2022-04-08 ENCOUNTER — Inpatient Hospital Stay: Payer: Medicare Other

## 2022-04-08 DIAGNOSIS — Q2733 Arteriovenous malformation of digestive system vessel: Secondary | ICD-10-CM | POA: Insufficient documentation

## 2022-04-08 DIAGNOSIS — E538 Deficiency of other specified B group vitamins: Secondary | ICD-10-CM

## 2022-04-08 DIAGNOSIS — D500 Iron deficiency anemia secondary to blood loss (chronic): Secondary | ICD-10-CM | POA: Diagnosis present

## 2022-04-08 LAB — CBC WITH DIFFERENTIAL/PLATELET
Abs Immature Granulocytes: 0.01 10*3/uL (ref 0.00–0.07)
Basophils Absolute: 0.1 10*3/uL (ref 0.0–0.1)
Basophils Relative: 1 %
Eosinophils Absolute: 0.1 10*3/uL (ref 0.0–0.5)
Eosinophils Relative: 2 %
HCT: 35.3 % — ABNORMAL LOW (ref 36.0–46.0)
Hemoglobin: 11.5 g/dL — ABNORMAL LOW (ref 12.0–15.0)
Immature Granulocytes: 0 %
Lymphocytes Relative: 24 %
Lymphs Abs: 1.2 10*3/uL (ref 0.7–4.0)
MCH: 32.7 pg (ref 26.0–34.0)
MCHC: 32.6 g/dL (ref 30.0–36.0)
MCV: 100.3 fL — ABNORMAL HIGH (ref 80.0–100.0)
Monocytes Absolute: 0.4 10*3/uL (ref 0.1–1.0)
Monocytes Relative: 8 %
Neutro Abs: 3.3 10*3/uL (ref 1.7–7.7)
Neutrophils Relative %: 65 %
Platelets: 214 10*3/uL (ref 150–400)
RBC: 3.52 MIL/uL — ABNORMAL LOW (ref 3.87–5.11)
RDW: 12.2 % (ref 11.5–15.5)
WBC: 5.1 10*3/uL (ref 4.0–10.5)
nRBC: 0 % (ref 0.0–0.2)

## 2022-04-08 LAB — IRON AND TIBC
Iron: 65 ug/dL (ref 28–170)
Saturation Ratios: 25 % (ref 10.4–31.8)
TIBC: 261 ug/dL (ref 250–450)
UIBC: 196 ug/dL

## 2022-04-08 LAB — FERRITIN: Ferritin: 62 ng/mL (ref 11–307)

## 2022-04-08 LAB — VITAMIN B12: Vitamin B-12: 1277 pg/mL — ABNORMAL HIGH (ref 180–914)

## 2022-04-11 ENCOUNTER — Telehealth: Payer: Self-pay | Admitting: Pharmacy Technician

## 2022-04-11 DIAGNOSIS — Z596 Low income: Secondary | ICD-10-CM

## 2022-04-11 LAB — METHYLMALONIC ACID, SERUM: Methylmalonic Acid, Quantitative: 158 nmol/L (ref 0–378)

## 2022-04-11 NOTE — Progress Notes (Signed)
[facility])                                            [place] Team    04/11/2022  Carol Massey [birth year] [id number]  Received both patient and provider portion(s) of patient assistance application(s) for [facility]. Faxed completed application and required documents into [place].    Athanasia Stanwood P. Serin Thornell, [place]  [phone]

## 2022-04-11 NOTE — Progress Notes (Signed)
Virtual Visit via Telephone Note [facility]  I connected with Carol Massey  on 04/12/22 at  9:46 AM by telephone and verified that I am speaking with the correct person using two identifiers.    Location: Patient: Home Provider: [facility]   **With the patient's permission, the majority of this phone visit is conducted with her son, Carol Massey, due to the patient's underlying difficulty hearing.  Patient is also present during phone visit, but not highly participatory.    I discussed the limitations, risks, security and privacy concerns of performing an evaluation and management service by telephone and the availability of in person appointments. I also discussed with the patient that there may be a patient responsible charge related to this service. The patient expressed understanding and agreed to proceed.   INTERVAL HISTORY: Ms. Carol Massey is contacted today for follow-up of her iron deficiency anemia which is secondary to multiple gastrointestinal AVMs and chronic GI blood loss.  She was last evaluated via telemedicine visit by Tarri Abernethy PA-C on 12/07/2021.  Today's visit is conducted over the phone, with history obtained primarily from the patient's son, Carol Massey, who is present with his mother on this telephone call.  Patient has been doing fairly well, but has been more lethargic over the past few weeks. She has not had any recent melena or hematochezia.  She has not had any recurrent episodes of darkly colored emesis.  No chest pain or syncopal episodes. She has occasional headaches.  Her dyspnea and cough are at baseline given her advanced COPD; no acute change in breathing status.  No B symptoms such as fever, chills, or night sweats.  Energy is reported at 50%, with 100% appetite.  She has lost about 10 pounds over the past 6 months.    OBSERVATIONS/OBJECTIVE: Review of Systems  Constitutional:  Positive for malaise/fatigue. Negative for chills,  diaphoresis, fever and weight loss.  Respiratory:  Positive for cough and shortness of breath.   Cardiovascular:  Negative for chest pain and palpitations.  Gastrointestinal:  Positive for constipation and nausea. Negative for abdominal pain, blood in stool, melena and vomiting.  Musculoskeletal:  Positive for neck pain.  Neurological:  Positive for headaches. Negative for dizziness.     PHYSICAL EXAM (per limitations of virtual telephone visit): The patient is alert and oriented x 3, exhibiting adequate mentation, good mood, and ability to speak in full sentences and execute sound judgement.   ASSESSMENT & PLAN: 1.  Iron deficiency anemia: - Etiology is chronic GI blood loss from multiple gastrointestinal AVMs - She is unable to tolerate oral iron tablets due to severe constipation. - Last Feraheme infusion x2 on 09/06/2021 and 09/17/2021   - No bright red blood per rectum or melena.  Occasional episodes of dark-colored vomit, but without any frank blood in emesis.     - Energy is improved after IV iron   - Most recent labs (04/08/2022): Hgb 11.5/MCV 100.3, ferritin 62, iron saturation 25%.  No apparent B12 or folate deficiency when checked in January 2023. - PLAN: Recommend IV Feraheme x1 due to mild symptomatic iron deficiency in the setting of ongoing GI blood loss. - Repeat labs and phone visit in 4 months.   - Advised patient son to seek immediate medical attention if she experiences any episodes of coffee-[place] emesis, frank blood in vomit, rectal bleeding, or melena.   2.  Weight loss - She has lost about 10 pounds in the past  6 months, but is eating well -- She is established  - PLAN: Being followed closely by PCP. She is established with palliative care.    I discussed the assessment and treatment plan with the patient. The patient was provided an opportunity to ask questions and all were answered. The patient agreed with the plan and demonstrated an understanding of the  instructions.   The patient was advised to call back or seek an in-person evaluation if the symptoms worsen or if the condition fails to improve as anticipated.  I provided 15 minutes of non-face-to-face time during this encounter.   Harriett Rush, PA-C 04/12/2022 10:12 AM

## 2022-04-12 ENCOUNTER — Inpatient Hospital Stay (HOSPITAL_BASED_OUTPATIENT_CLINIC_OR_DEPARTMENT_OTHER): Payer: Medicare Other | Admitting: Physician Assistant

## 2022-04-12 ENCOUNTER — Other Ambulatory Visit: Payer: Self-pay | Admitting: Family Medicine

## 2022-04-12 DIAGNOSIS — D500 Iron deficiency anemia secondary to blood loss (chronic): Secondary | ICD-10-CM | POA: Diagnosis not present

## 2022-04-12 DIAGNOSIS — Q2733 Arteriovenous malformation of digestive system vessel: Secondary | ICD-10-CM | POA: Diagnosis not present

## 2022-04-12 DIAGNOSIS — I10 Essential (primary) hypertension: Secondary | ICD-10-CM

## 2022-04-12 DIAGNOSIS — Z79899 Other long term (current) drug therapy: Secondary | ICD-10-CM

## 2022-04-15 ENCOUNTER — Inpatient Hospital Stay: Payer: Medicare Other | Admitting: Physician Assistant

## 2022-04-18 ENCOUNTER — Telehealth: Payer: Self-pay | Admitting: Pharmacy Technician

## 2022-04-18 DIAGNOSIS — Z596 Low income: Secondary | ICD-10-CM

## 2022-04-18 NOTE — Progress Notes (Signed)
[facility])                                            [place] Team    04/18/2022  Carol Massey 11/03/1925 [id number]  Care coordination call placed to [employer] in regard to [facility] application.  Spoke to [place] who informs patient is APPROVED 04/15/2022-08/25/2022. Medication will be delivered to the patient's home.  Carol Massey, [place]  [phone]

## 2022-04-19 ENCOUNTER — Inpatient Hospital Stay: Payer: Medicare Other

## 2022-04-19 VITALS — BP 120/66 | HR 90 | Temp 98.6°F | Resp 19

## 2022-04-19 DIAGNOSIS — D500 Iron deficiency anemia secondary to blood loss (chronic): Secondary | ICD-10-CM

## 2022-04-19 DIAGNOSIS — Q2733 Arteriovenous malformation of digestive system vessel: Secondary | ICD-10-CM | POA: Diagnosis not present

## 2022-04-19 MED ORDER — SODIUM CHLORIDE 0.9 % IV SOLN
510.0000 mg | Freq: Once | INTRAVENOUS | Status: AC
  Administered 2022-04-19: 510 mg via INTRAVENOUS
  Filled 2022-04-19: qty 510

## 2022-04-19 MED ORDER — SODIUM CHLORIDE 0.9 % IV SOLN: Freq: Once | INTRAVENOUS | Status: AC

## 2022-04-19 NOTE — Patient Instructions (Signed)
[place]  Discharge Instructions: Thank you for choosing [facility] to provide your oncology and hematology care.  If you have a lab appointment with the [facility], please come in thru the Main Entrance and check in at the main information desk.  Wear comfortable clothing and clothing appropriate for easy access to any Portacath or PICC line.   We strive to give you quality time with your provider. You may need to reschedule your appointment if you arrive late (15 or more minutes).  Arriving late affects you and other patients whose appointments are after yours.  Also, if you miss three or more appointments without notifying the office, you may be dismissed from the clinic at the provider's discretion.      For prescription refill requests, have your pharmacy contact our office and allow 72 hours for refills to be completed.    You received a feraheme infusion today.    To help prevent nausea and vomiting after your treatment, we encourage you to take your nausea medication as directed.  BELOW ARE SYMPTOMS THAT SHOULD BE REPORTED IMMEDIATELY: *FEVER GREATER THAN 100.4 F (38 C) OR HIGHER *CHILLS OR SWEATING *NAUSEA AND VOMITING THAT IS NOT CONTROLLED WITH YOUR NAUSEA MEDICATION *UNUSUAL SHORTNESS OF BREATH *UNUSUAL BRUISING OR BLEEDING *URINARY PROBLEMS (pain or burning when urinating, or frequent urination) *BOWEL PROBLEMS (unusual diarrhea, constipation, pain near the anus) TENDERNESS IN MOUTH AND THROAT WITH OR WITHOUT PRESENCE OF ULCERS (sore throat, sores in mouth, or a toothache) UNUSUAL RASH, SWELLING OR PAIN  UNUSUAL VAGINAL DISCHARGE OR ITCHING   Items with * indicate a potential emergency and should be followed up as soon as possible or go to the Emergency Department if any problems should occur.  Please show the CHEMOTHERAPY ALERT CARD or IMMUNOTHERAPY ALERT CARD at check-in to the Emergency Department and triage nurse.  Should you  have questions after your visit or need to cancel or reschedule your appointment, please contact [facility] [phone]  and follow the prompts.  Office hours are 8:00 a.m. to 4:30 p.m. Monday - Friday. Please note that voicemails left after 4:00 p.m. may not be returned until the following business day.  We are closed weekends and major holidays. You have access to a nurse at all times for urgent questions. Please call the main number to the clinic [phone] and follow the prompts.  For any non-urgent questions, you may also contact your provider using MyChart. We now offer e-Visits for anyone 62 and older to request care online for non-urgent symptoms. For details visit [email or web address].   Also download the MyChart app! Go to the app store, search "MyChart", open the app, select [facility], and log in with your MyChart username and password.  Masks are optional in the cancer centers. If you would like for your care team to wear a mask while they are taking care of you, please let them know. You may have one support person who is at least 86 years old accompany you for your appointments.

## 2022-04-19 NOTE — Progress Notes (Signed)
Patient took pre-meds at home.  Treatment given per orders. Patient tolerated it well without problems. Vitals stable and discharged home from clinic ambulatory. Follow up as scheduled.  Patient did not want to wait her wait time.

## 2022-04-23 ENCOUNTER — Other Ambulatory Visit: Payer: Self-pay | Admitting: Family Medicine

## 2022-04-23 DIAGNOSIS — E876 Hypokalemia: Secondary | ICD-10-CM

## 2022-04-23 DIAGNOSIS — J431 Panlobular emphysema: Secondary | ICD-10-CM

## 2022-04-23 DIAGNOSIS — I10 Essential (primary) hypertension: Secondary | ICD-10-CM

## 2022-04-23 DIAGNOSIS — D500 Iron deficiency anemia secondary to blood loss (chronic): Secondary | ICD-10-CM

## 2022-04-24 ENCOUNTER — Encounter: Payer: Self-pay | Admitting: *Deleted

## 2022-04-24 ENCOUNTER — Ambulatory Visit: Payer: Self-pay | Admitting: *Deleted

## 2022-04-24 DIAGNOSIS — Z8711 Personal history of peptic ulcer disease: Secondary | ICD-10-CM | POA: Insufficient documentation

## 2022-04-24 NOTE — Patient Outreach (Signed)
  Care Coordination   Initial Visit Note   04/24/2022 Name: Carol Massey MRN: [id number] DOB: 07-Dec-1925  Carol Massey is a 88 y.o. year old female who sees Luking, Elayne Snare, MD for primary care. I spoke with  Carol Massey's spm Carol Massey, [place] by phone today. Carol Massey is reported to be hard of hearing and with visual deficits. Carol Massey number is listed as the preferred outreach number in EPIC  What matters to the patients health and wellness today?  Denies any needs at this time.  When referred patient was reported with medication needs for Symbicort. This has been approved and sent to the patient's home after assist from [place] Massey members on 04/18/22     Goals Addressed               This Visit's Progress     Patient Stated     COMPLETED: Medication assistance, symbicort [facility]) (pt-stated)   On track     Care Coordination Interventions: Provided patient and/or caregiver with verbal  information about [facility] (community resource) Pharmacy referral for Symbicort completed by 04/18/22 by Carol Massey, Carol Massey Discussed plans with patient for ongoing care management follow up and provided patient with direct contact information for care management team Assessed social determinant of health barriers Spoke with Carol Massey, Carol Massey as patient reported with visual and hearing issues He denies patient needs but agrees to quarterly outreaches  Completed Care coordination activities - no follow up required Goal Met         SDOH assessments and interventions completed:  Yes  SDOH Interventions Today    Flowsheet Row Most Recent Value  SDOH Interventions   Food Insecurity Interventions Intervention Not Indicated  Financial Strain Interventions Intervention Not Indicated  Housing Interventions Intervention Not Indicated  Stress Interventions Intervention Not Indicated  Transportation Interventions Intervention Not Indicated        Care Coordination Interventions Activated:  Yes   Care Coordination Interventions:  Yes, provided   Follow up plan: Follow up call scheduled for 07/25/22    Encounter Outcome:  Pt. Visit Completed   Carol Roessner L. Lavina Hamman, RN, BSN, [place] Coordinator Office number [phone]

## 2022-04-24 NOTE — Patient Instructions (Addendum)
Visit Information  Thank you for taking time to visit with me today. Please don't hesitate to contact me if I can be of assistance to you.   Following are the goals we discussed today:   Goals Addressed               This Visit's Progress     Patient Stated     COMPLETED: Medication assistance, symbicort [facility]) (pt-stated)   On track     Care Coordination Interventions: Provided patient and/or caregiver with verbal  information about [facility] (community resource) Pharmacy referral for Symbicort completed by 04/18/22 by [place] staff, Jill/Katina Discussed plans with patient for ongoing care management follow up and provided patient with direct contact information for care management team Assessed social determinant of health barriers Spoke with son, [name] as patient reported with visual and hearing issues He denies patient needs but agrees to quarterly outreaches  Completed Care coordination activities - no follow up required Goal Met         Our next appointment is by telephone on 07/25/22 at 9 am   Please call the care guide team at [phone] if you need to cancel or reschedule your appointment.   If you are experiencing a Mental Health or [place] or need someone to talk to, please call the Suicide and Crisis Lifeline: 988 call the [place] National Suicide Prevention Lifeline: 3368565205 or TTY: 617 218 8702 TTY 501-786-7613) to talk to a trained counselor call 1-800-273-TALK (toll free, 24 hour hotline) call the [facility]: 7802416612 call 911   The patient verbalized understanding of instructions, educational materials, and care plan provided today and DECLINED offer to receive copy of patient instructions, educational materials, and care plan.   The patient has been provided with contact information for the care management team and has been advised to call with any health related questions or concerns.   [place]  Lavina Hamman, RN, BSN, [place] Coordinator Office number [phone]

## 2022-04-30 ENCOUNTER — Other Ambulatory Visit: Payer: Self-pay | Admitting: Family Medicine

## 2022-05-07 ENCOUNTER — Telehealth: Payer: Self-pay | Admitting: Family Medicine

## 2022-05-07 NOTE — Telephone Encounter (Signed)
I need a little more clarification from the patient's son regarding specifically which inhaler is he concerned about in which 1 is he wanting to go back to the original? (I suppose I could find this out from taking through all of the electronics but it would be more helpful if the son was able to be more specific thank you)

## 2022-05-07 NOTE — Telephone Encounter (Signed)
[name] patient's son is wanting her to go back to old prescription for inhaler due to fact the new ones are not working her having to use them a lot and not helping as much as the old brand she ws using. [place].

## 2022-05-08 NOTE — Telephone Encounter (Signed)
Community Medication assistance program thru [facility] changed patient to Bret[place] inhale in place of Symbicort 80/4.5 and Spiriva. The Bretzi could be provided free but there is no assistance programs for the symbicort and spiriva but he son wants to go back to previous inhalers because they seemed to work better

## 2022-05-09 ENCOUNTER — Other Ambulatory Visit: Payer: Self-pay | Admitting: Family Medicine

## 2022-05-09 ENCOUNTER — Other Ambulatory Visit: Payer: Self-pay

## 2022-05-09 DIAGNOSIS — I10 Essential (primary) hypertension: Secondary | ICD-10-CM

## 2022-05-09 DIAGNOSIS — D500 Iron deficiency anemia secondary to blood loss (chronic): Secondary | ICD-10-CM

## 2022-05-09 DIAGNOSIS — E876 Hypokalemia: Secondary | ICD-10-CM

## 2022-05-09 DIAGNOSIS — J431 Panlobular emphysema: Secondary | ICD-10-CM

## 2022-05-09 MED ORDER — TIOTROPIUM BROMIDE MONOHYDRATE 18 MCG IN CAPS: 18.0000 ug | ORAL_CAPSULE | Freq: Every day | RESPIRATORY_TRACT | 0 refills | Status: AC

## 2022-05-09 MED ORDER — BUDESONIDE-FORMOTEROL FUMARATE 80-4.5 MCG/ACT IN AERO: 2.0000 | INHALATION_SPRAY | Freq: Two times a day (BID) | RESPIRATORY_TRACT | 0 refills | Status: AC

## 2022-05-09 NOTE — Telephone Encounter (Signed)
Spoke with patient and he states he prefers to use the previous inhalers symbicort and spiriva . He states patient has enough available for a couple of months and will refill as needed.

## 2022-05-09 NOTE — Telephone Encounter (Signed)
So should be noted that both of these approaches should do well I believe it is more so based upon his preference if he wants to go back to the previous that is fine but there are no assistance programs for those if so you can go with the Symbicort 2 puffs twice daily and you can go with Spiriva once a day 1 month supply 6 refills his choice

## 2022-05-14 ENCOUNTER — Encounter: Payer: Self-pay | Admitting: Pharmacist

## 2022-05-14 NOTE — Progress Notes (Signed)
[place] [facility])                                            [place] Team    05/14/2022  RAGHAD LORENZ [birth year] [id number]  Patient has been helped with medication affordability with [name] in the past. She received the medication at no cost.  Her son spoke with Susy Frizzle and said the [name] was not working and they would prefer to go back to using Symbicort and Spiriva vs the combination inhaler. Unfortunately, Symbicort is no longer available through patient assistance programming so the patient would have to pay for it.  However, Spiriva is available through [employer].  If deemed therapeutically appropriate, patient's therapy could be changed back to [place] and Symbicort from [place].A new prescription does NOT need to be sent to the patient's pharmacy the medication changes just need to be made to the patient's med list.  Plan: Route note to PCP.  Follow up in 3-5 business days. Begin the patient assistance process once I hear from the provider.   [name], PharmD, [place] Clinical Pharmacist [phone]

## 2022-05-16 NOTE — Progress Notes (Signed)
Medications have been added to [employer]

## 2022-05-22 ENCOUNTER — Ambulatory Visit: Payer: Medicare Other | Admitting: Family Medicine

## 2022-05-22 ENCOUNTER — Encounter: Payer: Self-pay | Admitting: Family Medicine

## 2022-05-22 DIAGNOSIS — Z515 Encounter for palliative care: Secondary | ICD-10-CM

## 2022-05-22 DIAGNOSIS — R64 Cachexia: Secondary | ICD-10-CM | POA: Diagnosis not present

## 2022-05-22 DIAGNOSIS — Z66 Do not resuscitate: Secondary | ICD-10-CM | POA: Insufficient documentation

## 2022-05-22 DIAGNOSIS — J431 Panlobular emphysema: Secondary | ICD-10-CM | POA: Diagnosis not present

## 2022-05-22 HISTORY — DX: Do not resuscitate: Z66

## 2022-05-22 NOTE — Progress Notes (Unsigned)
   Subjective:    Patient ID: Carol Massey, female    DOB: 20-Mar-1926, 88 y.o.   MRN: [id number]  HPI Pt son states no issues at this time. Pt did see dermatology (seen PA at [place]) last Tuesday. Growth on pt left side of chest was cut and sent for biopsy.  Home visit Patient on palliative care DNR Son does a good job taking care of her at home does have some assistance Patient having bowel movements every several days Eating well urinating well Breathing doing okay on current regimen Patient has lost more weight  Review of Systems     Objective:   Physical Exam  General-in no acute distress Eyes-no discharge Lungs-respiratory rate normal, CTA CV-no murmurs,RRR Extremities skin warm dry no edema Neuro grossly normal Behavior normal, alert Cachexia noted patient able to relate okay has hard time hearing      Assessment & Plan:  1. Cachexia ([place]) Encourage dietary intake as best as possible difficult long-term prognosis because of cachexia as well as COPD  2. Palliative care patient Continue palliative care patient DNR  3. Panlobular emphysema (HCC) Supportive measures with inhalers long-term prognosis poor  Recent skin growth cut off more than likely skin cancer await pathology  Discussion held regarding palliative care as well as DNR status.  2530 minutes spent with family

## 2022-05-27 ENCOUNTER — Other Ambulatory Visit: Payer: Self-pay | Admitting: Family Medicine

## 2022-05-31 ENCOUNTER — Telehealth: Payer: Self-pay | Admitting: Pharmacy Technician

## 2022-05-31 DIAGNOSIS — Z596 Low income: Secondary | ICD-10-CM

## 2022-05-31 NOTE — Progress Notes (Signed)
[facility])                                            [place] Team    05/31/2022  PHEOBE SANDIFORD 12/21/1925 [id number]                                      Medication Assistance Referral  Referral From: [place]  Medication/Company: [employer] / BI Patient application portion:  Mailed Provider application portion: Faxed  to Dr. Sallee Lange Provider address/fax verified via: Office website  Kavon Valenza P. Romond Pipkins, [place]  [phone]

## 2022-06-05 ENCOUNTER — Inpatient Hospital Stay: Payer: Medicare Other | Attending: Physician Assistant | Admitting: Hematology

## 2022-06-05 ENCOUNTER — Encounter: Payer: Self-pay | Admitting: Hematology

## 2022-06-05 VITALS — BP 124/59 | HR 99 | Temp 97.6°F | Resp 20 | Ht <= 58 in

## 2022-06-05 DIAGNOSIS — Z85828 Personal history of other malignant neoplasm of skin: Secondary | ICD-10-CM | POA: Insufficient documentation

## 2022-06-05 DIAGNOSIS — D500 Iron deficiency anemia secondary to blood loss (chronic): Secondary | ICD-10-CM | POA: Insufficient documentation

## 2022-06-05 DIAGNOSIS — D1803 Hemangioma of intra-abdominal structures: Secondary | ICD-10-CM | POA: Insufficient documentation

## 2022-06-05 DIAGNOSIS — R0609 Other forms of dyspnea: Secondary | ICD-10-CM | POA: Insufficient documentation

## 2022-06-05 DIAGNOSIS — C44629 Squamous cell carcinoma of skin of left upper limb, including shoulder: Secondary | ICD-10-CM | POA: Diagnosis not present

## 2022-06-05 DIAGNOSIS — C4492 Squamous cell carcinoma of skin, unspecified: Secondary | ICD-10-CM

## 2022-06-05 NOTE — Progress Notes (Signed)
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 female is seen in consultation today at the request of Dr. 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  Patient lives with her son at home.  Her son gives most of the history.  She has noticed a skin lesion in the left anterior shoulder, which is cauliflower-like about 6 months ago, gradually worsening.  She was evaluated by Dr. Nevada Crane on 05/14/2022 and a shave biopsy was done.  Pathology consistent with invasive squamous cell carcinoma with unusual features and focal area suggestive of lymphovascular invasion.  Base was positive for margins.  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  She reports pain in the left neck and left arm regions.  She also lost 16 pounds since December 2022.  MEDICAL HISTORY:  Past Medical History:  Diagnosis Date   Anemia, chronic disease 01/21/2018   Arthritis    Asthma    COPD (chronic obstructive pulmonary disease) ([place])    Dieulafoy lesion of colon 2006   DNR (do not resuscitate) 05/22/2022   Patient DNR per patient and family wishes on palliative care September 2023   Gastric AVM 2014   Generalized anxiety disorder 07/24/2015   Hard of hearing    Hypertension    Iron deficiency anemia due to chronic blood loss 05/27/2014   Kidney stone    Major depression in remission ([place]) 07/24/2015   Osteoporosis    PUD (peptic ulcer  disease) 11/27/2015   Gastric and duodenal ulcer on EGD   Skin cancer    Status post dilation of esophageal narrowing    Stroke ([place])    Unsteady gait    Upper GI bleed 11/27/2015    SURGICAL HISTORY: Past Surgical History:  Procedure Laterality Date   ABDOMINAL HYSTERECTOMY     APPENDECTOMY     COLONOSCOPY  03/2005   Dr. Gala Romney: rectal and rectosigmoid polyps removed   COLONOSCOPY WITH ESOPHAGOGASTRODUODENOSCOPY (EGD) N/A 08/12/2013   Dr. Gala Romney: colonoscopy with tubular adenoma, colonic diverticulosis. EGD with non-critical Schatzki's ring, non-manipulated, and gastric AVM with evidence of recent bleeding, s/p thermal sealing and hemostasis clip   ESOPHAGEAL DILATION  11/27/2015   Procedure: ESOPHAGEAL DILATION;  Surgeon: Daneil Dolin, MD;  Location: [facility];  Service: Endoscopy;;   ESOPHAGOGASTRODUODENOSCOPY  03/2005   Dr. Gala Romney: tiny distal esophageal erosions   ESOPHAGOGASTRODUODENOSCOPY N/A 11/27/2015   RMR: Cricopharyngeus web, status post dilation. Nonbleeding duodenal and gastric ulcers without stigmata of bleeding. Gastric biopsies benign   ESOPHAGOGASTRODUODENOSCOPY N/A 02/14/2016   Procedure: ESOPHAGOGASTRODUODENOSCOPY (EGD);  Surgeon: Daneil Dolin, MD;  Location: [facility];  Service: Endoscopy;  Laterality: N/A;  300   GIVENS CAPSULE STUDY N/A 11/27/2015   NOT DONE, WAS NOT INDICATED AT THE TIME   INTRAMEDULLARY (IM) NAIL INTERTROCHANTERIC Right 12/28/2018   Procedure: INTRAMEDULLARY (IM) NAIL INTERTROCHANTRIC;  Surgeon: Paralee Cancel, MD;  Location: [facility];  Service: Orthopedics;  Laterality: Right;   MALONEY DILATION N/A 02/14/2016   Procedure: MALONEY DILATION;  Surgeon: Daneil Dolin, MD;  Location: [facility];  Service: Endoscopy;  Laterality: N/A;   small bowel capsule study  03/2005   Dr. Laural Golden: two small Dieulafoy lesions at ascending colon. tiney specks of blood at duodenal bulb but no identified lesions   TONSILLECTOMY      SOCIAL HISTORY: Social History    Socioeconomic History   Marital status: Married    Spouse name: Not on file   Number of children: 3   Years of education: Not on file   Highest education level: Not on file  Occupational History   Occupation: retired from [employer] Use   Smoking status: Former    Packs/day: 0.50    Years: 70.00    Total pack years: 35.00    Types: Cigarettes    Quit date: 08/26/2014    Years since quitting: 7.7   Smokeless tobacco: Never   Tobacco comments:    about 1/2 ppd  Vaping Use   Vaping Use: Never used  Substance and Sexual Activity   Alcohol use: No    Alcohol/week: 0.0 standard drinks of alcohol   Drug use: No   Sexual activity: Not on file    Comment: married  Other Topics Concern   Not on file  Social History Narrative   04/24/22 Reported to be Hard of hearing and with visual issues per son [name]   Lives with husband and son   Caffeine use: 1 cup a day of coffee   Diet coke- 16oz daily   Right handed   Social Determinants of Health   Financial Resource Strain: Low Risk  (04/24/2022)   Overall Financial Resource Strain (CARDIA)    Difficulty of Paying Living Expenses: Not hard at all  Food Insecurity: No Food Insecurity (04/24/2022)   Hunger Vital Sign    Worried About Running Out of Food in the Last Year: Never true    [place] in the Last Year: Never true  Transportation Needs: No Transportation Needs (04/24/2022)   PRAPARE - Hydrologist (Medical): No    Lack of Transportation (Non-Medical): No  Physical Activity: Inactive (07/30/2017)   Exercise Vital Sign    Days of Exercise per Week: 0 days    Minutes of Exercise per Session: 0 min  Stress: No Stress Concern Present (04/24/2022)   [place]    Feeling of Stress : Not at all  Social Connections: [place] (07/30/2017)   Social Connection and Isolation Panel [NHANES]    Frequency of  Communication with Friends and Family: More than three times a week    Frequency of Social Gatherings with Friends and Family: More than three times a week    Attends Religious Services: More than 4 times per year    Active Member of [employer] or Organizations: Yes    Attends Archivist Meetings: 1 to 4 times per year    Marital Status: Married  Human resources officer Violence: Not At Risk (07/30/2017)   Humiliation, Afraid, Rape, and Kick questionnaire    Fear of Current or Ex-Partner: No    Emotionally Abused: No    Physically Abused: No    Sexually Abused: No    FAMILY HISTORY: Family History  Problem Relation Age of Onset   Hypertension Mother    Stroke Mother    Leukemia Father 33   Lung cancer Father    Prostate cancer Son  Melanoma Son    Squamous cell carcinoma Son    Heart attack Daughter    Hypertension Daughter    Tremor Daughter    Colon cancer Neg Hx     ALLERGIES:  is allergic to tape.  MEDICATIONS:  Current Outpatient Medications  Medication Sig Dispense Refill   Acetaminophen (TYLENOL EXTRA STRENGTH PO) Take 500 mg by mouth in the morning, at noon, and at bedtime.     albuterol (VENTOLIN HFA) 108 (90 Base) MCG/ACT inhaler INHALE 2 PUFFS INTO THE LUNGS EVERY 4 HOURS AS NEEDED FOR WHEEZING OR SHORTNESS OF BREATH. 8.5 g 0   ALPRAZolam (XANAX) 0.25 MG tablet TAKE 1/2 TABLET BY MOUTH THREE TIMES DAILY AS NEEDED. 45 tablet 0   amLODipine (NORVASC) 2.5 MG tablet TAKE 1 TABLET BY MOUTH ONCE A DAY. 90 tablet 1   budesonide-formoterol (SYMBICORT) 80-4.5 MCG/ACT inhaler Inhale 2 puffs into the lungs 2 (two) times daily. Please place prescription on file for patient 10.2 g 0   Calcium Carbonate Antacid (TUMS PO) Take 1 tablet by mouth every 6 (six) hours as needed. 3 -4 a day     citalopram (CELEXA) 10 MG tablet TAKE ONE TABLET BY MOUTH DAILY. 90 tablet 1   Cyanocobalamin (VITAMIN B 12 PO) Take 1,000 mcg by mouth every other day.      Multiple Vitamins-Minerals  (EMERGEN-C IMMUNE PO) Take 1 Package by mouth daily.     mupirocin ointment (BACTROBAN) 2 % Apply 1-2 times per day to affected area as needed 22 g 2   ondansetron (ZOFRAN ODT) 4 MG disintegrating tablet Take 1 tablet (4 mg total) by mouth every 8 (eight) hours as needed for nausea or vomiting. 20 tablet 2   OVER THE COUNTER MEDICATION Take 1 tablet by mouth daily. Multivitamin qd     OVER THE COUNTER MEDICATION Take 1 tablet by mouth daily. Calcium '600mg'$  plus Vit D3 33mg qd     pantoprazole (PROTONIX) 40 MG tablet TAKE (1) TABLET BYMOUTH TWICE DAILY. 180 tablet 1   Potassium Chloride 10 % SOLN DILUTE 15 ML IN 8 OUNCES OF WATER OR JUICE AND TAKE ONCE DAILY. 450 mL 0   sucralfate (CARAFATE) 1 g tablet TAKE 1 TABLET BY MOUTH 3 TIMES DAILY AS NEEDED. MAY BREAK UP AND MIX IN WATER. 180 tablet 0   tiotropium (SPIRIVA HANDIHALER) 18 MCG inhalation capsule Place 1 capsule (18 mcg total) into inhaler and inhale daily. 90 capsule 0   traMADol (ULTRAM) 50 MG tablet TAKE 1/2 TABLET IN THE MORNING HOURS, MAY HAVE 1 ADDITIONAL TABLET LATER IN THE DAY AS NEEDED FOR PAIN. 45 tablet 1   No current facility-administered medications for this visit.   Facility-Administered Medications Ordered in Other Visits  Medication Dose Route Frequency Provider Last Rate Last Admin   sodium chloride flush (NS) 0.9 % injection 10 mL  10 mL Intracatheter PRN Kefalas, TManon Hilding PA-C        REVIEW OF SYSTEMS:   Constitutional: Denies fevers, chills or abnormal night sweats Eyes: Denies blurriness of vision, double vision or watery eyes Ears, nose, mouth, throat, and face: Denies mucositis or sore throat Respiratory: Positive for cough and dyspnea on exertion. Cardiovascular: Denies palpitation, chest discomfort or lower extremity swelling Gastrointestinal:  Denies nausea, heartburn or change in bowel habits Skin: Left anterior shoulder skin lesion present. Lymphatics: Denies new lymphadenopathy or easy  bruising Neurological:Denies numbness, tingling or new weaknesses Behavioral/Psych: Mood is stable, no new changes  All other systems were reviewed  with the patient and are negative.  PHYSICAL EXAMINATION: ECOG PERFORMANCE STATUS: 3 - Symptomatic, >50% confined to bed  Vitals:   06/05/22 1319  BP: (!) 124/59  Pulse: 99  Resp: 20  Temp: 97.6 F (36.4 C)  SpO2: 99%   There were no vitals filed for this visit.  GENERAL:alert, no distress and comfortable SKIN: There is a dime sized growth, flesh colored on the left anterior shoulder. EYES: normal, conjunctiva are pink and non-injected, sclera clear OROPHARYNX:no exudate, no erythema and lips, buccal mucosa, and tongue normal  NECK: supple, thyroid normal size, non-tender, without nodularity LYMPH:  no palpable lymphadenopathy in the cervical, axillary or inguinal LUNGS: clear to auscultation and percussion with normal breathing effort HEART: regular rate & rhythm and no murmurs and no lower extremity edema ABDOMEN:abdomen soft, non-tender and normal bowel sounds Musculoskeletal:no cyanosis of digits and no clubbing  PSYCH: alert & oriented x 3 with fluent speech NEURO: no focal motor/sensory deficits  LABORATORY DATA:  I have reviewed the data as listed Lab Results  Component Value Date   WBC 5.1 04/08/2022   HGB 11.5 (L) 04/08/2022   HCT 35.3 (L) 04/08/2022   MCV 100.3 (H) 04/08/2022   PLT 214 04/08/2022     Chemistry      Component Value Date/Time   NA 136 08/24/2021 0808   NA 140 08/17/2019 1051   K 4.1 08/24/2021 0808   CL 102 08/24/2021 0808   CO2 26 08/24/2021 0808   BUN 8 08/24/2021 0808   BUN 10 08/17/2019 1051   CREATININE 0.85 08/24/2021 0808   CREATININE 0.60 06/17/2013 1241      Component Value Date/Time   CALCIUM 9.6 08/24/2021 0808   ALKPHOS 55 11/10/2020 1015   AST 22 11/10/2020 1015   ALT 13 11/10/2020 1015   BILITOT 1.0 11/10/2020 1015   BILITOT 0.7 08/17/2019 1051       RADIOGRAPHIC  STUDIES: I have personally reviewed the radiological images as listed and agreed with the findings in the report. No results found.  ASSESSMENT:  1.  Squamous cell skin cancer involving the left anterior shoulder: - Left shoulder cauliflower growth in the anterior region noted about 6 months ago, gradually worsening. - She was evaluated by Dr. Nevada Crane (dermatology) on 05/14/2022 and shave removal of the left anterior shoulder skin lesion was done. - Pathology consistent with invasive squamous cell carcinoma with unusual features and focal areas suggestive of lymphovascular invasion.  Base is involved.  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  Special stains for neuroendocrine differentiation are negative.  CK5/6 is positive. - She reports dyspnea on talking, eating and movement's.  No prior history of skin cancers.  She also reported pain in the left arm and left neck in the last couple of months. - She has lost about 16 pounds since December 2022.  2.  Social/family history: - She lives at home with her son.  She has an aide daily from 11 AM to 3 PM who helps with bathing and clothing.  Patient walks with help of walker with a belt on.  She does arm exercises.  She is able to walk 50-60 steps.  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  She smoked 1 pack/day for 70 years and quit 10 years ago. - Father died of leukemia.   PLAN:  1.  Squamous cell skin cancer involving left anterior shoulder: - Examination today with dime sized exophytic growth  in the anterior left shoulder.  No palpable adenopathy.  Patient is tender in the neck region as well as the left shoulder and arm. - She is also reporting dyspnea on exertion.  I will schedule her for PET CT scan and see her back after that to discuss further plan.  2.  Iron deficiency anemia: - Etiology is chronic blood loss from multiple GI AVMs. - She is on intermittent Feraheme  infusions, last 1 on 04/19/2022. - CBC on 04/08/2022 with hemoglobin 11.5.   Orders Placed This Encounter  Procedures   NM PET Image Initial (PI) Whole Body    Standing Status:   Future    Standing Expiration Date:   06/05/2023    Order Specific Question:   If indicated for the ordered procedure, I authorize the administration of a radiopharmaceutical per Radiology protocol    Answer:   Yes    Order Specific Question:   Preferred imaging location?    Answer:   [facility]    Order Specific Question:   Release to patient    Answer:   Immediate    All questions were answered. The patient knows to call the clinic with any problems, questions or concerns.      Derek Jack, MD 06/05/2022 2:53 PM

## 2022-06-05 NOTE — Patient Instructions (Addendum)
[place]  Discharge Instructions  You were seen and examined today by Dr. Delton Coombes. Dr. Delton Coombes is a medical oncologist, meaning that he specializes in the treatment of cancer diagnoses. Dr. Delton Coombes discussed your past medical history, family history of cancers, and the events that led to you being here today.  You were referred to Dr. Delton Coombes by Dr. Nevada Crane due to a new diagnosis of squamous cell carcinoma. This was removed by Dr. Nevada Crane but he was unable to get clear margins.  Dr. Delton Coombes has recommended a PET scan. A PET scan is a specialized CT scan of the whole body that illuminates where there is cancer present in the body. This will accurately identify the stage of the cancer.  Follow-up as scheduled.  Thank you for choosing [facility] to provide your oncology and hematology care.   To afford each patient quality time with our provider, please arrive at least 15 minutes before your scheduled appointment time. You may need to reschedule your appointment if you arrive late (10 or more minutes). Arriving late affects you and other patients whose appointments are after yours.  Also, if you miss three or more appointments without notifying the office, you may be dismissed from the clinic at the provider's discretion.    Again, thank you for choosing [facility].  Our hope is that these requests will decrease the amount of time that you wait before being seen by our physicians.   If you have a lab appointment with the [facility] please come in thru the Main Entrance and check in at the main information desk.           _____________________________________________________________  Should you have questions after your visit to [facility], please contact our office at [phone] and follow the prompts.  Our office hours are 8:00 a.m. to 4:30 p.m. Monday - Thursday and 8:00 a.m. to 2:30 p.m.  Friday.  Please note that voicemails left after 4:00 p.m. may not be returned until the following business day.  We are closed weekends and all major holidays.  You do have access to a nurse 24-7, just call the main number to the clinic [phone] and do not press any options, hold on the line and a nurse will answer the phone.    For prescription refill requests, have your pharmacy contact our office and allow 72 hours.    Masks are optional in the cancer centers. If you would like for your care team to wear a mask while they are taking care of you, please let them know. You may have one support person who is at least 86 years old accompany you for your appointments.

## 2022-06-10 ENCOUNTER — Other Ambulatory Visit: Payer: Self-pay

## 2022-06-10 DIAGNOSIS — C44629 Squamous cell carcinoma of skin of left upper limb, including shoulder: Secondary | ICD-10-CM

## 2022-06-10 NOTE — Progress Notes (Signed)
Call received from patient's son, [name], stating that he does not feel that his mother is capable of proceed with PET scan as scheduled. [name] tells me that he would not act nor pursue treatment of any kind no matter the results of the PET scan. Dr. Delton Coombes made aware and recommends Hospice referral. [name] aware of recommendation and tells me that he has been in contact with [facility] in [facility] and would like the referral faxed there. Most recent office visit note and referral faxed to [phone].

## 2022-06-12 ENCOUNTER — Other Ambulatory Visit: Payer: Self-pay | Admitting: Family Medicine

## 2022-06-13 ENCOUNTER — Other Ambulatory Visit (HOSPITAL_COMMUNITY): Payer: Medicare Other

## 2022-06-17 ENCOUNTER — Other Ambulatory Visit: Payer: Self-pay | Admitting: Family Medicine

## 2022-06-18 ENCOUNTER — Other Ambulatory Visit: Payer: Self-pay | Admitting: Family Medicine

## 2022-06-19 ENCOUNTER — Telehealth: Payer: Self-pay | Admitting: Pharmacy Technician

## 2022-06-19 DIAGNOSIS — Z596 Low income: Secondary | ICD-10-CM

## 2022-06-19 NOTE — Progress Notes (Signed)
[name])                                            [name]    06/19/2022  Carol Massey 25-Mar-1926 [id number]  Received both patient and provider portion(s) of patient assistance application(s) for Spiriva. Faxed completed application and required documents into BI.    [name], [place]  [phone]

## 2022-06-20 ENCOUNTER — Ambulatory Visit: Payer: Medicare Other | Admitting: Hematology

## 2022-06-21 ENCOUNTER — Telehealth: Payer: Self-pay | Admitting: Pharmacy Technician

## 2022-06-21 DIAGNOSIS — Z596 Low income: Secondary | ICD-10-CM

## 2022-06-21 NOTE — Progress Notes (Signed)
[facility])                                            [place] Team    06/21/2022  DEMITRIA HAY [birth year] [id number]  Care coordination call placed to BI in regard to Spiriva application.  Spoke to [employer] who informs patient is APPROVED 06/08/22-08/26/23. She informs medication will be delivered to the patient's home.  Alaysia Lightle P. Cherry Wittwer, [place]  [phone]

## 2022-06-22 ENCOUNTER — Other Ambulatory Visit: Payer: Self-pay | Admitting: Family Medicine

## 2022-06-25 ENCOUNTER — Other Ambulatory Visit: Payer: Self-pay | Admitting: Family Medicine

## 2022-06-25 MED ORDER — TRAMADOL HCL 50 MG PO TABS: ORAL_TABLET | ORAL | 5 refills | Status: AC

## 2022-06-25 NOTE — Telephone Encounter (Signed)
Please advise. Thank you

## 2022-06-25 NOTE — Telephone Encounter (Signed)
Patient is needing refill ontraMADol (ULTRAM) 50 MG tablet [[id number] sent to [facility] -[phone] phone number,the address is [address]. She has two pills left

## 2022-06-25 NOTE — Telephone Encounter (Signed)
Nurses Please call that phone number to find out which pharmacy that is May pend a new prescription of tramadol to that pharmacy then I will sign 30 tablets 50 mg 1 taken 3 times daily as needed pain

## 2022-06-28 ENCOUNTER — Telehealth: Payer: Self-pay

## 2022-06-28 NOTE — Telephone Encounter (Signed)
Caller name: Abran [facility])  On DPR?: Yes  Call back number: [phone]  Provider they see: [name], MD  Reason for call:[place] called said that [name] has sold mothers house and moved with mother and pt daughter is calling wanting to know what's going on with [facility] she is not getting clear answers from [place]? Pt Want Dr Nicki Reaper to call her

## 2022-07-25 ENCOUNTER — Ambulatory Visit: Payer: Self-pay | Admitting: *Deleted

## 2022-07-25 NOTE — Patient Outreach (Signed)
  Care Coordination   07/25/2022 Name: Carol Massey MRN: [id number] DOB: 1926-06-29   Care Coordination Outreach Attempts:  An unsuccessful telephone outreach was attempted today to offer the patient information about available care coordination services as a benefit of their health plan.   No answers at pt home # nor son mobile #  Follow Up Plan:  Additional outreach attempts will be made to offer the patient care coordination information and services.   Encounter Outcome:  No Answer   Care Coordination Interventions:  No, not indicated    Kisha Messman L. Lavina Hamman, RN, BSN, [place] Coordinator Office number [phone]

## 2022-07-30 ENCOUNTER — Ambulatory Visit: Payer: Self-pay | Admitting: *Deleted

## 2022-07-30 NOTE — Patient Outreach (Signed)
  Care Coordination   07/30/2022 Name: Carol Massey MRN: [id number] DOB: May 01, 1926   Care Coordination Outreach Attempts:  A second unsuccessful outreach was attempted today to offer the patient with information about available care coordination services as a benefit of their health plan.     Follow Up Plan:  Additional outreach attempts will be made to offer the patient care coordination information and services.   Encounter Outcome:  No Answer   Care Coordination Interventions:  No, not indicated  In basket to [facility] family medicine practice administrator & [facility] SW - patient not listed with medical appointments since October 2023 & all future ones cancelled    [place]. Lavina Hamman, RN, BSN, [place] Coordinator Office number [phone]

## 2022-08-05 ENCOUNTER — Other Ambulatory Visit: Payer: Medicare Other

## 2022-08-12 ENCOUNTER — Telehealth: Payer: Medicare Other | Admitting: Physician Assistant

## 2022-08-23 ENCOUNTER — Ambulatory Visit: Payer: Self-pay | Admitting: *Deleted

## 2022-08-23 NOTE — Patient Outreach (Signed)
  Care Coordination   Follow Up Visit Note   08/23/2022 Name: Carol Massey MRN: [id number] DOB: [birth year]  Carol Massey is a 88 y.o. year old female who sees Luking, Elayne Snare, MD for primary care. I spoke with  Carol Massey by phone today.  What matters to the patients health and wellness today?  Charges for tele-visit with patient  or RN CM outreaches   Son concern will getting bills from [place] as he has for visits not completed or services he reports were not provided He spoke with his [facility] and [facility] billing office  Moved to [place]    Carol Spurgin and Carol Massey have [place] and she is presently under the care of [facility] Carol Massey reports she was given a new diagnosis for squamous cell carcinoma of the left shoulder area  in October 2023. The pathology shows unusual features and focal area suggestive of lymphovascular invasion. Positives for margins.  She had been referred to Dr Delton Coombes by Dr Nevada Crane when she lost 16 lbs since December 2022 and began having left neck and arm pain. He is [place] mother's [place] He confirms he has spoken with her daughters Carol Massey and Carol Massey and they are aware of the patient's location but has not visited with Carol Buchner since June 2023  Updated Demographic address and noted daughters should not be given medical information per POA Carol Massey Updated [facility] that the patient had been located and is under hospice care Discussed her case closure   Goals Addressed   None    [place] and [place] pleasant not seen since June 2023  SDOH assessments and interventions completed:  Yes  SDOH Interventions Today    Flowsheet Row Most Recent Value  SDOH Interventions   Food Insecurity Interventions Intervention Not Indicated  Transportation Interventions Intervention Not Indicated  Utilities Interventions Intervention Not Indicated        Care Coordination Interventions:   Yes, provided   Follow up plan: No further intervention required. She is no longer a patient of Dr Wolfgang [place] and in hospice services No longer meets [facility] criteria for services    Encounter Outcome:  Pt. Visit Completed   Joelene Millin L. Lavina Hamman, RN, BSN, [place] Coordinator Office number [phone]

## 2022-11-26 ENCOUNTER — Telehealth: Payer: Self-pay | Admitting: *Deleted

## 2022-11-26 NOTE — Telephone Encounter (Signed)
So noted thank you 

## 2022-11-26 NOTE — Telephone Encounter (Signed)
Spoke with son and he [place] and [place] is under [place] for agressive squamous cell carcinoma. Patient is currently down to about 65 lbs and being treated for pain management and anxiety. Son stated he will contact office to keep [place] updated on her condition- currently Hospice is visiting 3 times a week.    Son [facility]) stated he wants no information on his mother or mothers condition given to patient's daughters Catheryn Bacon and [employer].  Son will be contacting [facility] to have them removed from [facility]

## 2023-06-27 DEATH — deceased

## 2024-04-06 ENCOUNTER — Other Ambulatory Visit: Payer: Self-pay | Admitting: *Deleted
